# Patient Record
Sex: Male | Born: 1945 | Race: Black or African American | Hispanic: No | Marital: Married | Smoking: Former smoker
Health system: Southern US, Community
[De-identification: ages and names within clinical notes are randomized; demographics above are authoritative.]

## PROBLEM LIST (undated history)

## (undated) DIAGNOSIS — F0280 Dementia in other diseases classified elsewhere without behavioral disturbance: Secondary | ICD-10-CM

## (undated) DIAGNOSIS — I10 Essential (primary) hypertension: Secondary | ICD-10-CM

## (undated) DIAGNOSIS — M1990 Unspecified osteoarthritis, unspecified site: Secondary | ICD-10-CM

## (undated) DIAGNOSIS — F419 Anxiety disorder, unspecified: Secondary | ICD-10-CM

## (undated) DIAGNOSIS — S069XAA Unspecified intracranial injury with loss of consciousness status unknown, initial encounter: Secondary | ICD-10-CM

## (undated) HISTORY — PX: COLON SURGERY: SHX602

## (undated) HISTORY — DX: Dementia in other diseases classified elsewhere, unspecified severity, without behavioral disturbance, psychotic disturbance, mood disturbance, and anxiety: F02.80

## (undated) HISTORY — PX: HERNIA REPAIR: SHX51

## (undated) HISTORY — PX: COLOSTOMY: SHX63

## (undated) HISTORY — PX: COLONOSCOPY: SHX174

## (undated) HISTORY — PX: FOOT SURGERY: SHX648

---

## 2012-02-15 ENCOUNTER — Ambulatory Visit: Payer: Non-veteran care | Attending: Student | Admitting: Physical Therapy

## 2012-02-15 DIAGNOSIS — IMO0001 Reserved for inherently not codable concepts without codable children: Secondary | ICD-10-CM | POA: Insufficient documentation

## 2012-02-15 DIAGNOSIS — R293 Abnormal posture: Secondary | ICD-10-CM | POA: Insufficient documentation

## 2012-02-15 DIAGNOSIS — M545 Low back pain, unspecified: Secondary | ICD-10-CM | POA: Insufficient documentation

## 2012-02-15 DIAGNOSIS — M6281 Muscle weakness (generalized): Secondary | ICD-10-CM | POA: Insufficient documentation

## 2012-02-18 ENCOUNTER — Ambulatory Visit: Payer: Non-veteran care | Attending: Student | Admitting: Physical Therapy

## 2012-02-18 DIAGNOSIS — R293 Abnormal posture: Secondary | ICD-10-CM | POA: Insufficient documentation

## 2012-02-18 DIAGNOSIS — M545 Low back pain, unspecified: Secondary | ICD-10-CM | POA: Insufficient documentation

## 2012-02-18 DIAGNOSIS — M6281 Muscle weakness (generalized): Secondary | ICD-10-CM | POA: Insufficient documentation

## 2012-02-18 DIAGNOSIS — IMO0001 Reserved for inherently not codable concepts without codable children: Secondary | ICD-10-CM | POA: Insufficient documentation

## 2012-03-03 ENCOUNTER — Ambulatory Visit: Payer: Non-veteran care | Admitting: Physical Therapy

## 2012-03-05 ENCOUNTER — Ambulatory Visit: Payer: Non-veteran care | Admitting: Physical Therapy

## 2012-03-10 ENCOUNTER — Ambulatory Visit: Payer: Non-veteran care | Admitting: Physical Therapy

## 2012-03-12 ENCOUNTER — Ambulatory Visit: Payer: Non-veteran care | Admitting: Physical Therapy

## 2012-03-17 ENCOUNTER — Ambulatory Visit: Payer: Non-veteran care | Admitting: Physical Therapy

## 2012-03-19 ENCOUNTER — Ambulatory Visit: Payer: Non-veteran care | Admitting: Physical Therapy

## 2012-03-24 ENCOUNTER — Ambulatory Visit: Payer: Non-veteran care | Attending: Student | Admitting: Physical Therapy

## 2012-03-24 DIAGNOSIS — R293 Abnormal posture: Secondary | ICD-10-CM | POA: Insufficient documentation

## 2012-03-24 DIAGNOSIS — M545 Low back pain, unspecified: Secondary | ICD-10-CM | POA: Insufficient documentation

## 2012-03-24 DIAGNOSIS — M6281 Muscle weakness (generalized): Secondary | ICD-10-CM | POA: Insufficient documentation

## 2012-03-24 DIAGNOSIS — IMO0001 Reserved for inherently not codable concepts without codable children: Secondary | ICD-10-CM | POA: Insufficient documentation

## 2012-03-26 ENCOUNTER — Ambulatory Visit: Payer: Non-veteran care | Admitting: Physical Therapy

## 2012-03-31 ENCOUNTER — Ambulatory Visit: Payer: Non-veteran care | Admitting: Physical Therapy

## 2012-04-07 ENCOUNTER — Ambulatory Visit: Payer: Non-veteran care | Admitting: Physical Therapy

## 2012-04-09 ENCOUNTER — Ambulatory Visit: Payer: Non-veteran care | Admitting: Physical Therapy

## 2012-04-14 ENCOUNTER — Ambulatory Visit: Payer: Non-veteran care | Admitting: Physical Therapy

## 2017-08-21 ENCOUNTER — Ambulatory Visit: Payer: Self-pay | Admitting: Podiatry

## 2017-08-27 ENCOUNTER — Encounter: Payer: Self-pay | Admitting: Podiatry

## 2017-08-27 ENCOUNTER — Ambulatory Visit (INDEPENDENT_AMBULATORY_CARE_PROVIDER_SITE_OTHER): Payer: Non-veteran care

## 2017-08-27 ENCOUNTER — Ambulatory Visit (INDEPENDENT_AMBULATORY_CARE_PROVIDER_SITE_OTHER): Payer: Non-veteran care | Admitting: Podiatry

## 2017-08-27 VITALS — BP 114/68 | HR 86 | Temp 98.5°F | Resp 16

## 2017-08-27 DIAGNOSIS — M2012 Hallux valgus (acquired), left foot: Secondary | ICD-10-CM | POA: Diagnosis not present

## 2017-08-27 DIAGNOSIS — M2042 Other hammer toe(s) (acquired), left foot: Secondary | ICD-10-CM | POA: Diagnosis not present

## 2017-08-27 NOTE — Progress Notes (Signed)
   Subjective:    Patient ID: Carl Figueroa, male    DOB: Apr 13, 1946, 72 y.o.   MRN: [id number]  HPI    Review of Systems  Musculoskeletal: Positive for gait problem.  All other systems reviewed and are negative.      Objective:   Physical Exam        Assessment & Plan:

## 2017-08-29 ENCOUNTER — Telehealth: Payer: Self-pay | Admitting: *Deleted

## 2017-08-29 NOTE — Telephone Encounter (Signed)
"  I'm trying to get an appointment for a surgical.  Please give me a call."

## 2017-08-29 NOTE — Progress Notes (Signed)
   Subjective: 72 year old male presenting today as a new patient with a complaint of a painful bunion of the left foot that has been present for several years. He states it has caused an ulceration for the past year that is also very sore. Wearing certain shoes increases the pain. There are no alleviating factors noted. He has been applying antibiotic ointment for treatment. Patient is here for further evaluation and treatment.   No past medical history on file.    Objective: Physical Exam General: The patient is alert and oriented x3 in no acute distress.  Dermatology: Skin is cool, dry and supple bilateral lower extremities. Negative for open lesions or macerations.  Vascular: Palpable pedal pulses bilaterally. No edema or erythema noted. Capillary refill within normal limits.  Neurological: Epicritic and protective threshold grossly intact bilaterally.   Musculoskeletal Exam: Clinical evidence of bunion deformity noted to the respective foot. There is a moderate pain on palpation range of motion of the first MPJ. 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 Hammertoe contracture also noted on clinical exam to the second digit of the left foot. Symptomatic pain on palpation and range of motion also noted to the metatarsal phalangeal joints of the respective hammertoe digits.    Radiographic Exam: Increased intermetatarsal angle greater than 15 with a hallux abductus angle greater than 30 noted on AP view. Moderate degenerative changes noted within the first MPJ. Contracture deformity also noted to the interphalangeal joints and MPJs of the digits of the respective hammertoes.    Assessment: 1. HAV w/ bunion deformity left foot 2. Hammertoe deformity second digit left foot   Plan of Care:  1. Patient was evaluated. X-Rays reviewed. 2. Today we discussed the conservative versus surgical management of the presenting pathology. The patient opts for  surgical management. All possible complications and details of the procedure were explained. All patient questions were answered. No guarantees were expressed or implied. 3. Authorization for surgery was initiated today. Surgery will consist of bunionectomy with osteotomy of the left foot. PIPJ arthroplasty with MPJ capsulotomy of the left foot.  4. CAM boot dispensed.  5. Return to clinic 1 week postop.     Felecia ShellingBrent M. Esker Dever, DPM [facility]  Dr. Felecia ShellingBrent M. Kaysee Hergert, DPM    [address]                                        [place], [place] [address]                Office [phone]  Fax [phone]([phone]

## 2017-08-30 NOTE — Telephone Encounter (Signed)
"  I need to coordinate with you an operation for my foot."

## 2017-09-02 ENCOUNTER — Telehealth: Payer: Self-pay | Admitting: Podiatry

## 2017-09-02 NOTE — Telephone Encounter (Signed)
I was examined there by Dr. Logan BoresEvans and he stated that I would need surgery. I cannot reach the scheduling nurse so I'm calling this nurse to try to get a schedule date to get this operation done. My number is [phone]. Thank you.

## 2017-09-03 NOTE — Telephone Encounter (Signed)
I attempted to call the patient.  I left him a message that I apologize for the delay in calling him back.  I just received the scheduling information from the [place] office.

## 2017-09-04 NOTE — Telephone Encounter (Signed)
I am returning your call.  You want to schedule surgery with Dr. Logan BoresEvans?  "Yes, I do."  He does surgery on Thursdays.  His next available date is 09/26/2017.  "If that's the earliest he has, I'll take it."  I'll get it scheduled.  "Is there any other instructions I need to know?"  Do no eat or drink anything after midnight the night before.  Clean your foot with the scrub brush the night before.  You can register with the surgical center, instructions are in the brochure that was given to you.  Someone from the surgical center will call you with the arrival time a day or two prior to your surgery date.

## 2017-09-13 ENCOUNTER — Telehealth: Payer: Self-pay | Admitting: *Deleted

## 2017-09-13 NOTE — Telephone Encounter (Signed)
I called and left the patient a message that we have him scheduled for 09/26/2017.  Someone from the facility will call you with the arrival time a day or two prior to your appointment.  Call if you have any further questions.

## 2017-09-13 NOTE — Telephone Encounter (Signed)
Pt states he received a call with information on how to schedule for a surgery. I asked pt if he had signed a 3 page surgery consent for and he stated he had not. I told him I would transfer to schedulers to get an appt for surgery consultation and Dr. Logan BoresEvans would explain the surgery in detail and he would see a diagram of the surgery on the 3rd page of the consent form. Transferred pt to schedulers.

## 2017-09-13 NOTE — Telephone Encounter (Signed)
[facility]  I informed her that we are trying to get authorization for Carl Figueroa's surgery that's scheduled for 09/26/2017.  She asked that I fax her his clinical notes and she would get it to the proper place because he's going to need a new referral that includes surgery.  [facility].  Now we're awaiting a response.

## 2017-09-20 ENCOUNTER — Ambulatory Visit (INDEPENDENT_AMBULATORY_CARE_PROVIDER_SITE_OTHER): Payer: Non-veteran care | Admitting: Podiatry

## 2017-09-20 DIAGNOSIS — M2012 Hallux valgus (acquired), left foot: Secondary | ICD-10-CM

## 2017-09-20 DIAGNOSIS — M2042 Other hammer toe(s) (acquired), left foot: Secondary | ICD-10-CM | POA: Diagnosis not present

## 2017-09-20 NOTE — Patient Instructions (Signed)
Pre-Operative Instructions  Congratulations, you have decided to take an important step towards improving your quality of life.  You can be assured that the doctors and staff at [facility] will be with you every step of the way.  Here are some important things you should know:  1. Plan to be at the surgery center/hospital at least 1 (one) hour prior to your scheduled time, unless otherwise directed by the surgical center/hospital staff.  You must have a responsible adult accompany you, remain during the surgery and drive you home.  Make sure you have directions to the surgical center/hospital to ensure you arrive on time. 2. If you are having surgery at [facility] or [facility], you will need a copy of your medical history and physical form from your family physician within one month prior to the date of surgery. We will give you a form for your primary physician to complete.  3. We make every effort to accommodate the date you request for surgery.  However, there are times where surgery dates or times have to be moved.  We will contact you as soon as possible if a change in schedule is required.   4. No aspirin/ibuprofen for one week before surgery.  If you are on aspirin, any non-steroidal anti-inflammatory medications (Mobic, Aleve, Ibuprofen) should not be taken seven (7) days prior to your surgery.  You make take Tylenol for pain prior to surgery.  5. Medications - If you are taking daily heart and blood pressure medications, seizure, reflux, allergy, asthma, anxiety, pain or diabetes medications, make sure you notify the surgery center/hospital before the day of surgery so they can tell you which medications you should take or avoid the day of surgery. 6. No food or drink after midnight the night before surgery unless directed otherwise by surgical center/hospital staff. 7. No alcoholic beverages 24-hours prior to surgery.  No smoking 24-hours prior or 24-hours after  surgery. 8. Wear loose pants or shorts. They should be loose enough to fit over bandages, boots, and casts. 9. Don't wear slip-on shoes. Sneakers are preferred. 10. Bring your boot with you to the surgery center/hospital.  Also bring crutches or a walker if your physician has prescribed it for you.  If you do not have this equipment, it will be provided for you after surgery. 11. If you have not been contacted by the surgery center/hospital by the day before your surgery, call to confirm the date and time of your surgery. 12. Leave-time from work may vary depending on the type of surgery you have.  Appropriate arrangements should be made prior to surgery with your employer. 13. Prescriptions will be provided immediately following surgery by your doctor.  Fill these as soon as possible after surgery and take the medication as directed. Pain medications will not be refilled on weekends and must be approved by the doctor. 14. Remove nail polish on the operative foot and avoid getting pedicures prior to surgery. 15. Wash the night before surgery.  The night before surgery wash the foot and leg well with water and the antibacterial soap provided. Be sure to pay special attention to beneath the toenails and in between the toes.  Wash for at least three (3) minutes. Rinse thoroughly with water and dry well with a towel.  Perform this wash unless told not to do so by your physician.  Enclosed: 1 Ice pack (please put in freezer the night before surgery)   1 Hibiclens skin cleaner     Pre-op instructions  If you have any questions regarding the instructions, please do not hesitate to call our office.  [place]: [address], [place], [place] [address] -- [phone]  [place]: [address] [place], [place] [address] -- [phone]  [place]: [address]  [place], [place] [address] -- [phone]  [place]: [address], [address], [place],  [address] -- [phone]  Website:  [email or web address] 

## 2017-09-23 NOTE — Progress Notes (Signed)
   Subjective: 72 year old male presenting today for follow up evaluation of a bunion to the left foot and hammertoe deformity of the second digit of the left foot. He denies any changes to his condition. He is here for a surgical consultation.    No past medical history on file.    Objective: Physical Exam General: The patient is alert and oriented x3 in no acute distress.  Dermatology: Skin is cool, dry and supple bilateral lower extremities. Negative for open lesions or macerations.  Vascular: Palpable pedal pulses bilaterally. No edema or erythema noted. Capillary refill within normal limits.  Neurological: Epicritic and protective threshold grossly intact bilaterally.   Musculoskeletal Exam: Clinical evidence of bunion deformity noted to the respective foot. There is a moderate pain on palpation range of motion of the first MPJ. 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 Hammertoe contracture also noted on clinical exam to the second digit of the left foot. Symptomatic pain on palpation and range of motion also noted to the metatarsal phalangeal joints of the respective hammertoe digits.     Assessment: 1. HAV w/ bunion deformity left foot 2. Hammertoe deformity second digit left foot   Plan of Care:  1. Patient was evaluated.  2. Today we discussed the conservative versus surgical management of the presenting pathology. The patient opts for surgical management. All possible complications and details of the procedure were explained. All patient questions were answered. No guarantees were expressed or implied. 3. Authorization for surgery was re-initiated today. Surgery will consist of bunionectomy with osteotomy of the left foot. PIPJ arthroplasty with MPJ capsulotomy of the left foot.  4. Return to clinic 1 week postop.     Felecia ShellingBrent M. Leatrice Parilla, DPM [facility]  Dr. Felecia ShellingBrent M. Aalina Brege, DPM    [address]                                         [place], [place] [address]                Office [phone](336) [phone]  Fax [phone](336) [phone]

## 2017-09-25 NOTE — Telephone Encounter (Addendum)
I called and spoke to TokelauShaniqua.  I explained what was going on and what I was told as well as the patient.  She connected me to [facility] Ascinda a Charity fundraiserN.  Bonita QuinLinda informed me she could not authorize the surgery after I explained everything that has transpired during this process.  She said she would have to present it to the higher ups and they would have to make the decision.  I told her I guess I need to cancel his surgery that is scheduled for tomorrow.  She told me to hold off on canceling it for now because she was going to see if it can be authorized before they left for the evening.    Aram BeechamCynthia called the surgical center and inquired about the status of the authorization.  I informed her of what's going on.  I told her we may have to reschedule it.  She said she would get Misty to give me a call in the morning to check on the status.  I left the patient a message letting him know what was going on.  I told him I would call him in the morning and let him know about the status.  I also asked him not to eat or drink anything after midnight just in case the surgery will be still on.

## 2017-09-25 NOTE — Telephone Encounter (Signed)
"  I see you called.  Did you get it taken care of?"  I did not.  I spoke to a nurse.  She said she couldn't authorize it.  She said it had to come for the higher ups.  She said she would try to have an answer by the end of the day but I have not received anything.  So I will check again in the morning.  Do not eat or drink anything after midnight to be safe.  "Okay I will not.  So, you will call me if the surgery is approved, correct?"  Yes, that is correct.

## 2017-09-25 NOTE — Telephone Encounter (Signed)
I called and spoke [facility] [place] [facility].  I explained to her that I was calling about an authorization for surgery for Mr. Cathlean CowerBaldwin.  She informed me that she worked in the call center.  She said she would give the message to [place]iqua and that TokelauShaniqua would call me back.    I called and informed Mr. Cathlean CowerBaldwin about what was going on with the [facility].  I informed him that he may want to call and see what he can find out about the authorization for his surgery.  He stated he would give them a call.  Mrs. Cathlean CowerBaldwin called me back and stated they called and [facility].  She stated Dondra SpryGail had a nasty demeanor.  She said she must be having a bad day.  She stated Dondra SpryGail told her that the physician's office had not sent in a request for authorization for surgery.  She stated no notes or anything had been sent.  I informed her that we had [facility] all the [facility].  I gave her the names of everyone I had spoken to previously.  I told her I would call again and see what I could find out.  She said they told her it could take up to 21 days to process the request.  She said her husband is in a lot of pain with that foot and hopes he does not have to wait that long.  I told her I would let her know what I find out but we may have to cancel the surgery.  She stated she understood.

## 2017-09-25 NOTE — Telephone Encounter (Signed)
[facility] to see if [name]'s surgery had been authorized.  [facility].  She gave me an authorization number of [id number] with three visits.  The validation is good from 07/31/17 to 12/01/17  I asked her if this authorization included surgery.  She stated she did not know.  I informed her [facility] had [facility].  She told me I needed to speak to someone on that team.  She said she will send them a message and someone would give me a call back.

## 2017-09-26 ENCOUNTER — Encounter: Payer: Self-pay | Admitting: Podiatry

## 2017-09-26 NOTE — Telephone Encounter (Signed)
"  My name is Selinda MichaelsLinda Hunt.  I have been working on Mr. Pia MauBaldwin's case.  He was supposed to have surgery today.  The procedures were a Double Osteotomy, Hammer Toe and Capsulotomy MPJ Release Joint.   These procedures are covered under the Authorization code that was given to you."  I asked several people; Isaiah Serge[facility]annon, Gail, and [place] and they all said it wasn't when I asked them.  What's the authorization number?  "I reviewed the notes that you did send on 09/13/2017 and it did state in those notes that the patient would have surgery on 09/26/2017 and they also stated the procedures to be performed.  I consulted with the [employer]A and they said that the surgery was covered.  The authorization number is [id number], it's valid from 08/06/2017 to 12/01/2017.  It has how many visits?"  It has three visits.  "How many has he used?"  He's going to need a new authorization.  "Check with his doctor and see how many after care visits he will need.  Call me back or send me a message letting me know.  I will send the new authorization to you.  Let me know the new date of surgery."  I will check with Dr. Logan BoresEvans and contact Mr. Cathlean CowerBaldwin to schedule his surgery.  What's your fax number?  "Send it to the same fax number, [phone].   I called and informed Mr. Cathlean CowerBaldwin.  We are going to schedule him for surgery on 10/03/2017.   I called Aram BeechamCynthia at [facility] and scheduled his surgery.  I gave her authorization number.  She is requesting a copy of the authorization once we receive it from the [facility].

## 2017-10-02 ENCOUNTER — Telehealth: Payer: Self-pay | Admitting: *Deleted

## 2017-10-02 NOTE — Telephone Encounter (Signed)
"  I'm calling to see if I am okay to have the surgery tomorrow.  Was it approved?"  I was told by Carl Figueroa that your surgery was authorized.  I haven't received the actual referral yet but I sent her the information that she needed which was the date of your surgery and the number of follow-up visits.  "Okay, what time do I need to be there and where am I going?  Is it over by the hospital?"  You should have received a call by now.  You can give them a call, the phone number is [phone].  They are not near the hospital.  Their address is [address]  The address is on the back of the brochure that we gave you.  "Okay, thank you so much."

## 2017-10-03 DIAGNOSIS — M2042 Other hammer toe(s) (acquired), left foot: Secondary | ICD-10-CM | POA: Diagnosis not present

## 2017-10-03 DIAGNOSIS — M2022 Hallux rigidus, left foot: Secondary | ICD-10-CM | POA: Diagnosis not present

## 2017-10-03 DIAGNOSIS — M7752 Other enthesopathy of left foot: Secondary | ICD-10-CM | POA: Diagnosis not present

## 2017-10-04 ENCOUNTER — Encounter: Payer: Non-veteran care | Admitting: Podiatry

## 2017-10-11 ENCOUNTER — Encounter: Payer: Self-pay | Admitting: Podiatry

## 2017-10-11 ENCOUNTER — Ambulatory Visit (INDEPENDENT_AMBULATORY_CARE_PROVIDER_SITE_OTHER): Payer: Non-veteran care

## 2017-10-11 ENCOUNTER — Encounter: Payer: Non-veteran care | Admitting: Podiatry

## 2017-10-11 ENCOUNTER — Ambulatory Visit (INDEPENDENT_AMBULATORY_CARE_PROVIDER_SITE_OTHER): Payer: Non-veteran care | Admitting: Podiatry

## 2017-10-11 VITALS — BP 102/64 | Temp 97.4°F

## 2017-10-11 DIAGNOSIS — Z9889 Other specified postprocedural states: Secondary | ICD-10-CM

## 2017-10-11 DIAGNOSIS — M2012 Hallux valgus (acquired), left foot: Secondary | ICD-10-CM

## 2017-10-14 NOTE — Progress Notes (Signed)
   Subjective:  Patient presents today status post bunionectomy and 2nd hammertoe repair left. DOS: 10/03/17. He states he is doing well. He denies any pain or complaints at this time. Patient is here for further evaluation and treatment.    No past medical history on file.    Objective/Physical Exam Neurovascular status intact.  Skin incisions appear to be well coapted with sutures and staples intact. No sign of infectious process noted. No dehiscence. No active bleeding noted. Moderate edema noted to the surgical extremity.  Radiographic Exam:  Orthopedic hardware and osteotomies sites appear to be stable with routine healing.  Assessment: 1. s/p bunionectomy and 2nd hammertoe repair left. DOS: 10/03/17   Plan of Care:  1. Patient was evaluated. X-rays reviewed 2. Dressing changed. 3. Continue minimal weightbearing in CAM boot.  4. Return to clinic in one week.    Felecia ShellingBrent M. Savhanna Sliva, DPM [facility]  Dr. Felecia ShellingBrent M. Akesha Uresti, DPM    [address]                                        [place], [place] [address]                Office [phone](336) [phone]  Fax [phone](336) [phone]

## 2017-10-18 ENCOUNTER — Ambulatory Visit (INDEPENDENT_AMBULATORY_CARE_PROVIDER_SITE_OTHER): Payer: Non-veteran care | Admitting: Podiatry

## 2017-10-18 DIAGNOSIS — Z9889 Other specified postprocedural states: Secondary | ICD-10-CM

## 2017-10-21 NOTE — Progress Notes (Signed)
   Subjective:  Patient presents today status post bunionectomy and 2nd hammertoe repair left. DOS: 10/03/17. He states he is doing much better. He reports a decrease in the swelling. He denies any new complaints at this time. Patient is here for further evaluation and treatment.    No past medical history on file.    Objective/Physical Exam Neurovascular status intact.  Skin incisions appear to be well coapted with sutures and staples intact. No sign of infectious process noted. No dehiscence. No active bleeding noted. Moderate edema noted to the surgical extremity.  Assessment: 1. s/p bunionectomy and 2nd hammertoe repair left. DOS: 10/03/17   Plan of Care:  1. Patient was evaluated.  2. Staples removed.  3. Continue weightbearing in CAM boot.  4. Return to clinic in 2 weeks.    Felecia ShellingBrent M. Evans, DPM [facility]  Dr. Felecia ShellingBrent M. Evans, DPM    [address]                                        [place], [place] [address]                Office [phone](336) [phone]  Fax [phone](336) [phone]

## 2017-10-28 NOTE — Progress Notes (Signed)
Bunionectomy with osteotomy left foot. Hammertoe repair second digit left foot.

## 2017-11-01 ENCOUNTER — Encounter: Payer: Self-pay | Admitting: Podiatry

## 2017-11-01 ENCOUNTER — Ambulatory Visit (INDEPENDENT_AMBULATORY_CARE_PROVIDER_SITE_OTHER): Payer: Non-veteran care | Admitting: Podiatry

## 2017-11-01 ENCOUNTER — Ambulatory Visit (INDEPENDENT_AMBULATORY_CARE_PROVIDER_SITE_OTHER): Payer: Non-veteran care

## 2017-11-01 DIAGNOSIS — M2012 Hallux valgus (acquired), left foot: Secondary | ICD-10-CM

## 2017-11-01 DIAGNOSIS — Z9889 Other specified postprocedural states: Secondary | ICD-10-CM

## 2017-11-01 NOTE — Progress Notes (Signed)
   Subjective:  Patient presents today status post bunionectomy and 2nd hammertoe repair left. DOS: 10/03/17. He reports an improvement in the pain. He reports some associated numbness and tingling of the foot. Patient is here for further evaluation and treatment.    No past medical history on file.    Objective/Physical Exam Neurovascular status intact.  Skin incisions appear to be well coapted. No sign of infectious process noted. No dehiscence. No active bleeding noted. Moderate edema noted to the surgical extremity.  Radiographic Exam:  Orthopedic hardware and osteotomies sites appear to be stable with routine healing.  Assessment: 1. s/p bunionectomy and 2nd hammertoe repair left. DOS: 10/03/17   Plan of Care:  1. Patient was evaluated. X-Rays reviewed.  2. Pins removed.  3. Post op shoe dispensed. Discontinue using CAM boot. Weightbearing as tolerated.  4. Compression anklet dispensed.  5. Return to clinic in 4 weeks.   Felecia ShellingBrent M. Zendayah Hardgrave, DPM [facility]  Dr. Felecia ShellingBrent M. Delpha Perko, DPM    [address]                                        [place], [place] [address]                Office [phone](336) [phone]  Fax [phone](336) [phone]

## 2017-11-29 ENCOUNTER — Ambulatory Visit (INDEPENDENT_AMBULATORY_CARE_PROVIDER_SITE_OTHER): Payer: Non-veteran care | Admitting: Podiatry

## 2017-11-29 ENCOUNTER — Ambulatory Visit (INDEPENDENT_AMBULATORY_CARE_PROVIDER_SITE_OTHER): Payer: Non-veteran care

## 2017-11-29 ENCOUNTER — Encounter: Payer: Self-pay | Admitting: Podiatry

## 2017-11-29 DIAGNOSIS — M21619 Bunion of unspecified foot: Secondary | ICD-10-CM

## 2017-11-29 DIAGNOSIS — Z9889 Other specified postprocedural states: Secondary | ICD-10-CM

## 2017-12-02 NOTE — Progress Notes (Signed)
   Subjective:  Patient presents today status post bunionectomy and 2nd hammertoe repair left. DOS: 10/03/17. He states his left second toe is rubbing against the great toe. He reports associated swelling of the foot that began today. He denies pain. Patient is here for further evaluation and treatment.    No past medical history on file.    Objective/Physical Exam Neurovascular status intact.  Skin incisions appear to be well coapted. No sign of infectious process noted. No dehiscence. No active bleeding noted. Moderate edema noted to the surgical extremity.  Radiographic Exam:  Orthopedic hardware and osteotomies sites appear to be stable with routine healing.  Assessment: 1. s/p bunionectomy and 2nd hammertoe repair left. DOS: 10/03/17   Plan of Care:  1. Patient was evaluated. X-Rays reviewed.  2. May return to full duty with no restrictions.  3. Resume wearing good shoe gear.  4. Compression anklet dispensed.  5. Return to clinic as needed.   Felecia ShellingBrent M. Evans, DPM [facility]  Dr. Felecia ShellingBrent M. Evans, DPM    [address]                                        [place], [place] [address]                Office [phone]) [phone]  Fax [phone](336) [phone]

## 2019-07-06 ENCOUNTER — Inpatient Hospital Stay
Admission: EM | Admit: 2019-07-06 | Discharge: 2019-07-09 | DRG: 871 | Disposition: A | Discharge status: Home Health | Payer: No Typology Code available for payment source | Attending: Internal Medicine | Admitting: Internal Medicine

## 2019-07-06 ENCOUNTER — Other Ambulatory Visit: Payer: Self-pay

## 2019-07-06 ENCOUNTER — Emergency Department: Payer: No Typology Code available for payment source

## 2019-07-06 DIAGNOSIS — R7989 Other specified abnormal findings of blood chemistry: Secondary | ICD-10-CM | POA: Diagnosis present

## 2019-07-06 DIAGNOSIS — N3090 Cystitis, unspecified without hematuria: Secondary | ICD-10-CM

## 2019-07-06 DIAGNOSIS — F419 Anxiety disorder, unspecified: Secondary | ICD-10-CM | POA: Diagnosis present

## 2019-07-06 DIAGNOSIS — F0390 Unspecified dementia without behavioral disturbance: Secondary | ICD-10-CM | POA: Diagnosis present

## 2019-07-06 DIAGNOSIS — Z79899 Other long term (current) drug therapy: Secondary | ICD-10-CM

## 2019-07-06 DIAGNOSIS — D696 Thrombocytopenia, unspecified: Secondary | ICD-10-CM | POA: Diagnosis present

## 2019-07-06 DIAGNOSIS — A419 Sepsis, unspecified organism: Principal | ICD-10-CM | POA: Diagnosis present

## 2019-07-06 DIAGNOSIS — M545 Low back pain: Secondary | ICD-10-CM | POA: Diagnosis present

## 2019-07-06 DIAGNOSIS — N179 Acute kidney failure, unspecified: Secondary | ICD-10-CM | POA: Diagnosis present

## 2019-07-06 DIAGNOSIS — Z20828 Contact with and (suspected) exposure to other viral communicable diseases: Secondary | ICD-10-CM | POA: Diagnosis present

## 2019-07-06 DIAGNOSIS — F05 Delirium due to known physiological condition: Secondary | ICD-10-CM | POA: Diagnosis present

## 2019-07-06 DIAGNOSIS — N390 Urinary tract infection, site not specified: Secondary | ICD-10-CM | POA: Diagnosis present

## 2019-07-06 DIAGNOSIS — G9341 Metabolic encephalopathy: Secondary | ICD-10-CM | POA: Diagnosis present

## 2019-07-06 DIAGNOSIS — R778 Other specified abnormalities of plasma proteins: Secondary | ICD-10-CM | POA: Diagnosis present

## 2019-07-06 DIAGNOSIS — I10 Essential (primary) hypertension: Secondary | ICD-10-CM | POA: Diagnosis present

## 2019-07-06 DIAGNOSIS — R319 Hematuria, unspecified: Secondary | ICD-10-CM | POA: Diagnosis present

## 2019-07-06 DIAGNOSIS — N3091 Cystitis, unspecified with hematuria: Secondary | ICD-10-CM | POA: Diagnosis present

## 2019-07-06 DIAGNOSIS — G8929 Other chronic pain: Secondary | ICD-10-CM | POA: Diagnosis present

## 2019-07-06 HISTORY — DX: Essential (primary) hypertension: I10

## 2019-07-06 LAB — BASIC METABOLIC PANEL
Anion gap: 11 (ref 5–15)
BUN: 11 mg/dL (ref 8–23)
CO2: 26 mmol/L (ref 22–32)
Calcium: 9.4 mg/dL (ref 8.9–10.3)
Chloride: 108 mmol/L (ref 98–111)
Creatinine, Ser: 1.56 mg/dL — ABNORMAL HIGH (ref 0.61–1.24)
GFR calc Af Amer: 50 mL/min — ABNORMAL LOW (ref >60)
GFR calc non Af Amer: 43 mL/min — ABNORMAL LOW (ref >60)
Glucose, Bld: 119 mg/dL — ABNORMAL HIGH (ref 70–99)
Potassium: 3.6 mmol/L (ref 3.5–5.1)
Sodium: 145 mmol/L (ref 135–145)

## 2019-07-06 LAB — URINALYSIS, COMPLETE (UACMP) WITH MICROSCOPIC
Bilirubin Urine: NEGATIVE
Glucose, UA: NEGATIVE mg/dL
Ketones, ur: NEGATIVE mg/dL
Nitrite: NEGATIVE
Protein, ur: 100 mg/dL — AB
RBC / HPF: >50 RBC/hpf — ABNORMAL HIGH (ref 0–5)
Specific Gravity, Urine: 1.015 (ref 1.005–1.030)
WBC, UA: >50 WBC/hpf — ABNORMAL HIGH (ref 0–5)
pH: 5 (ref 5.0–8.0)

## 2019-07-06 LAB — TROPONIN I (HIGH SENSITIVITY): Troponin I (High Sensitivity): 40 ng/L — ABNORMAL HIGH (ref <18)

## 2019-07-06 LAB — CBC
HCT: 39.2 % (ref 39.0–52.0)
Hemoglobin: 13 g/dL (ref 13.0–17.0)
MCH: 28.3 pg (ref 26.0–34.0)
MCHC: 33.2 g/dL (ref 30.0–36.0)
MCV: 85.4 fL (ref 80.0–100.0)
Platelets: 149 10*3/uL — ABNORMAL LOW (ref 150–400)
RBC: 4.59 MIL/uL (ref 4.22–5.81)
RDW: 12.8 % (ref 11.5–15.5)
WBC: 9.4 10*3/uL (ref 4.0–10.5)
nRBC: 0 % (ref 0.0–0.2)

## 2019-07-06 LAB — LACTIC ACID, PLASMA: Lactic Acid, Venous: 1.6 mmol/L (ref 0.5–1.9)

## 2019-07-06 IMAGING — CR DG CHEST 2V
1 series · 2 of 2 positions shown · non-contrast
Comparison: None.

CLINICAL DATA: Fever, right chest pain

EXAM:
CHEST - 2 VIEW

[Series 1: dg chest 2 view · 0.14mm/px · 2 of 2 slices shown]
[im 1/2]
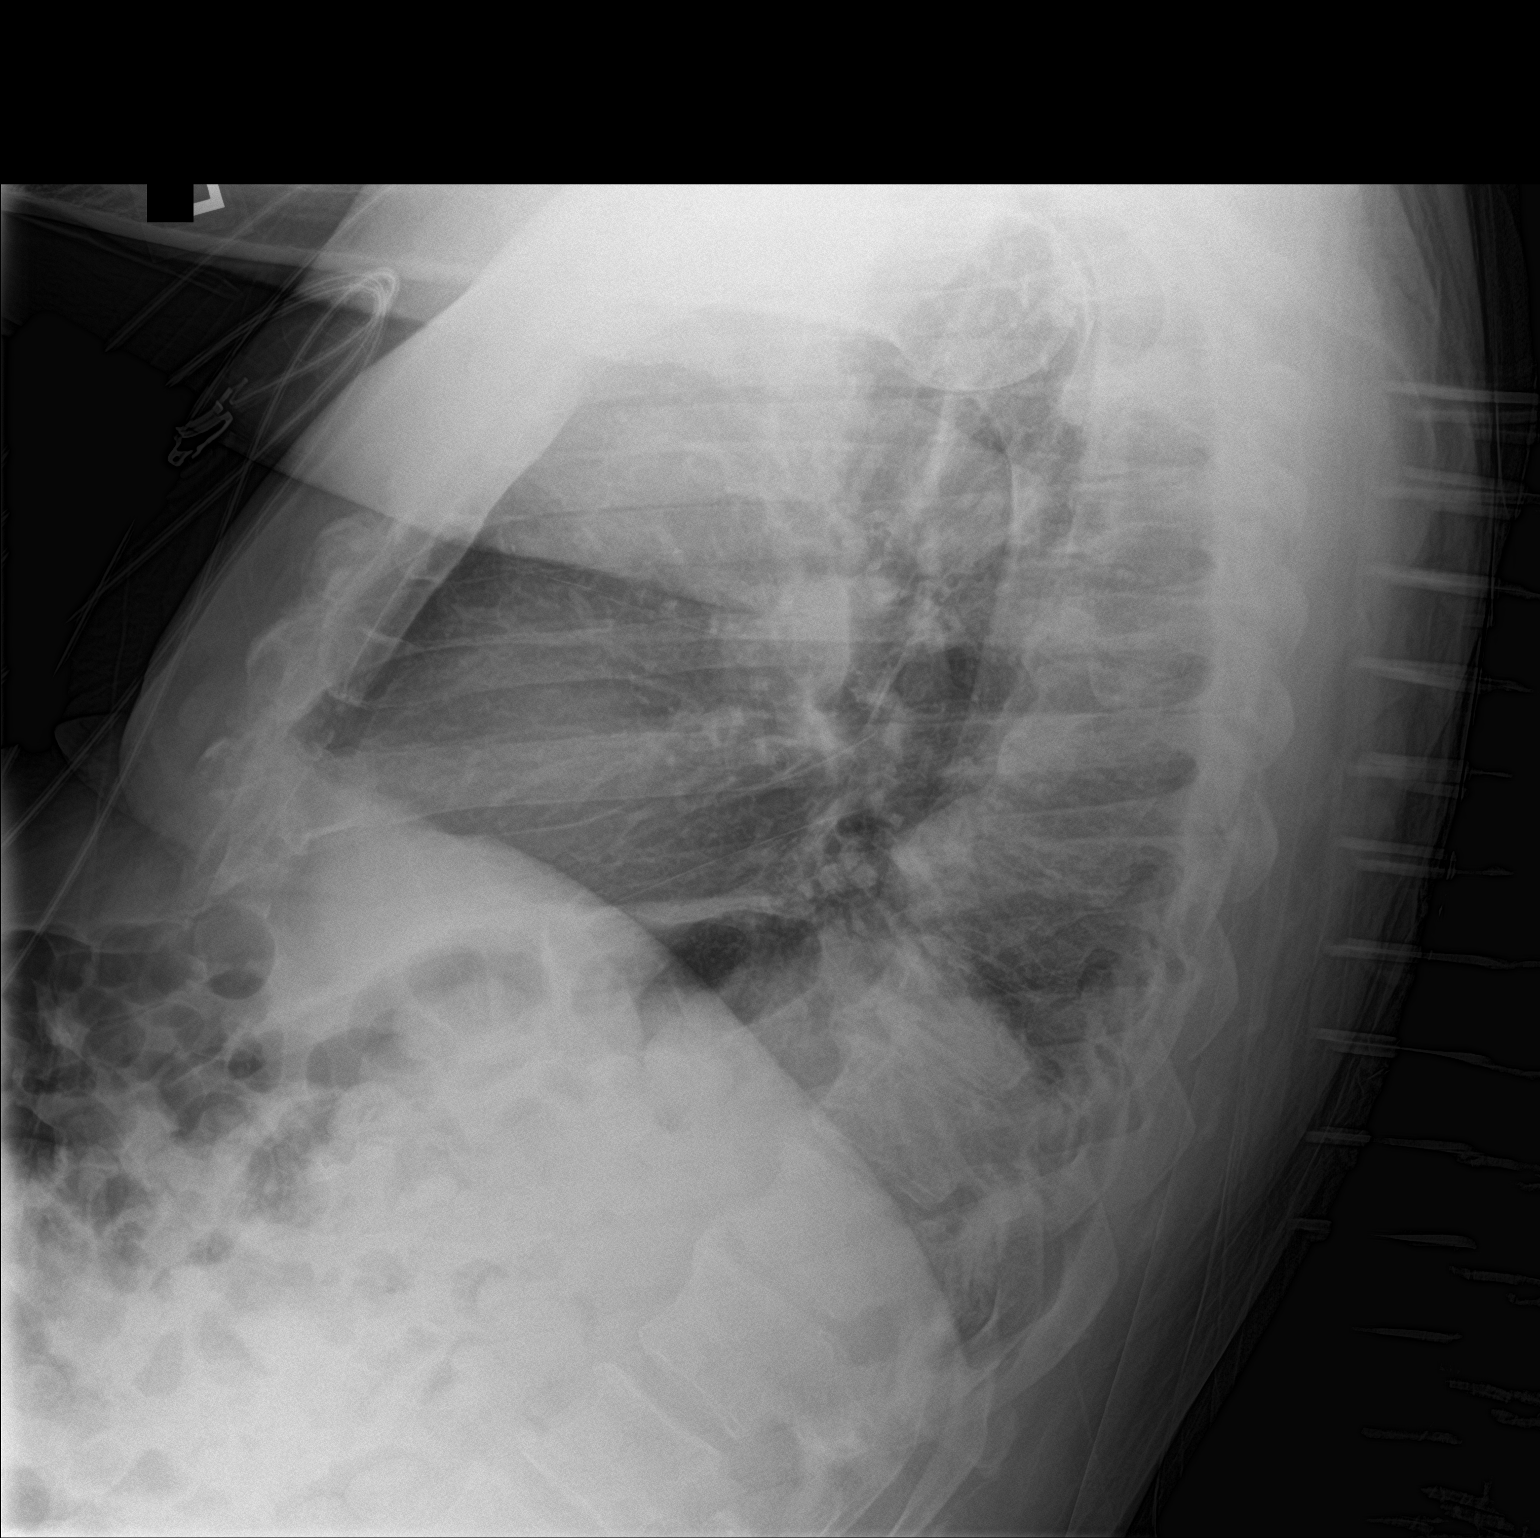
[im 2/2]
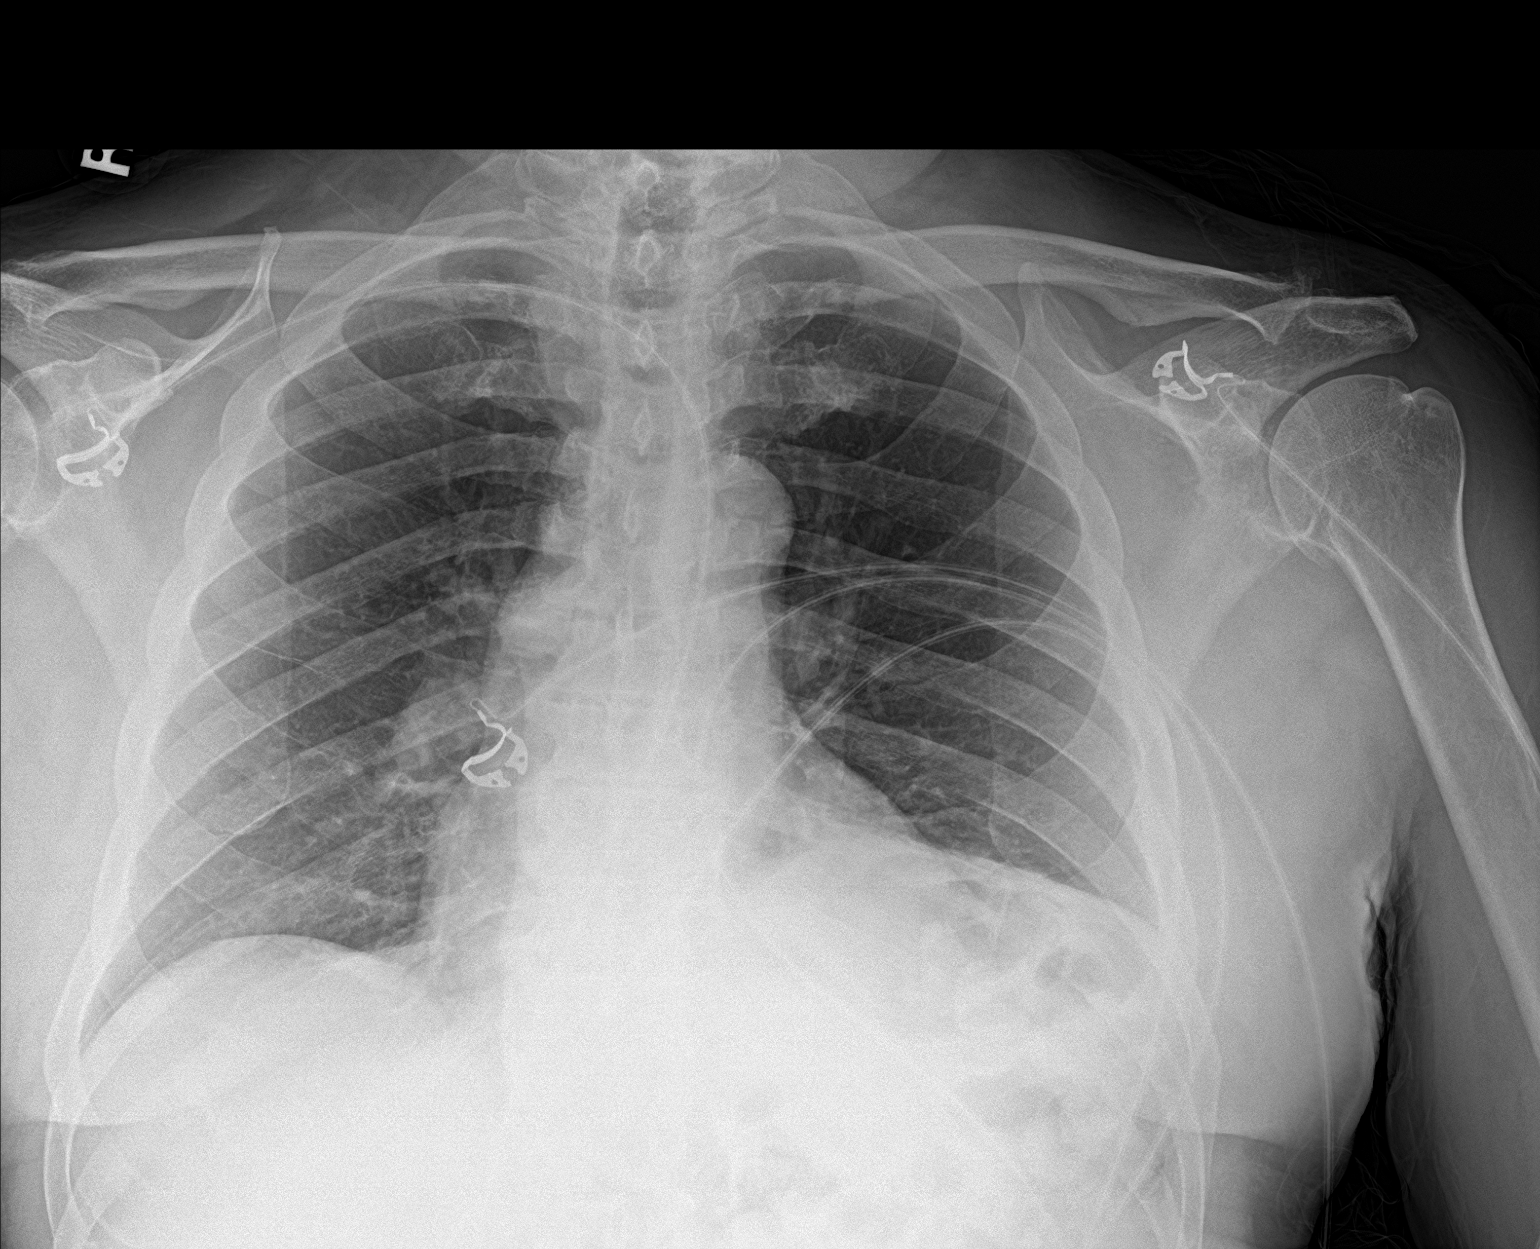

[2 of 2 positions shown; findings below may reference images not displayed]

FINDINGS: Slight elevation of the left hemidiaphragm with left base
atelectasis. Right lung clear. Heart is normal size. No effusions.
No acute bony abnormality.
IMPRESSION: Minimal left base atelectasis.

## 2019-07-06 IMAGING — CT CT RENAL STONE PROTOCOL
2 of 4 series · 14 of 46 positions shown, 16 images · non-contrast
Comparison: None.
COMPARISON: None.

Addendum:
CLINICAL DATA: Frequency hematuria flank pain

EXAM:
CT ABDOMEN AND PELVIS WITHOUT CONTRAST
TECHNIQUE: Multidetector CT imaging of the abdomen and pelvis was performed
following the standard protocol without IV contrast.

[Series 2: stone full standard · axial · 0.71mm/px · z∈[-1248,-878]mm · 11 of 90 slices shown, 13 images]
[im 8/90  soft-tissue]
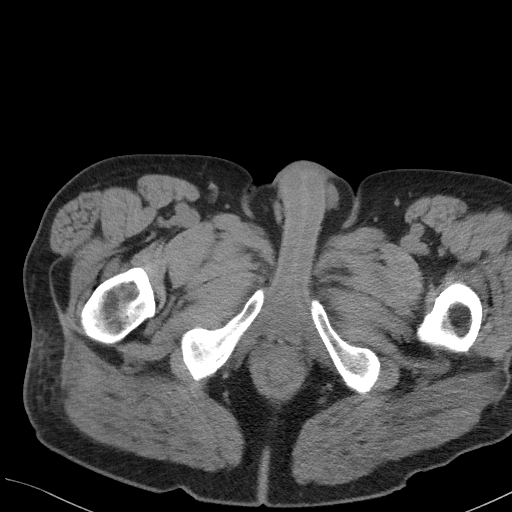
[im 8/90  bone]
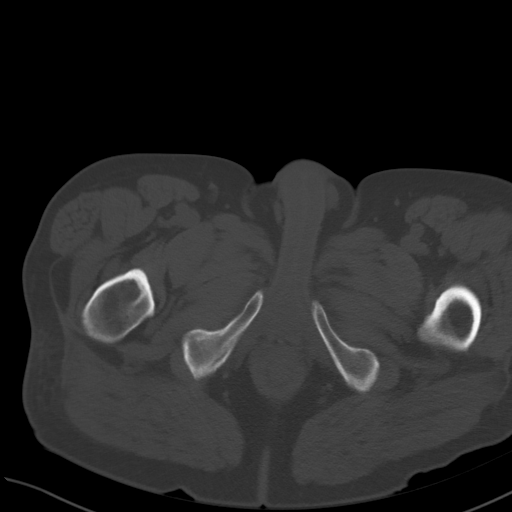
[im 15/90  soft-tissue]
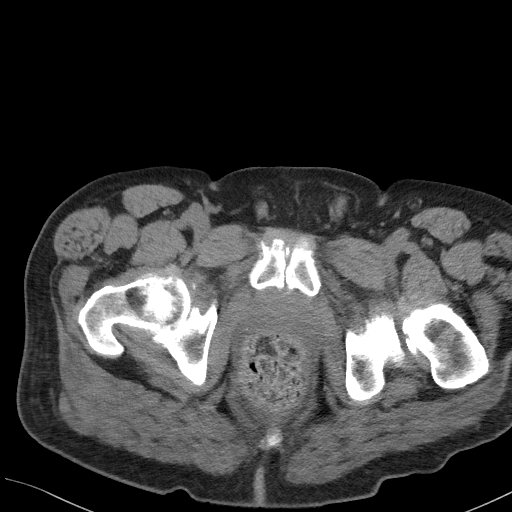
[im 23/90  soft-tissue]
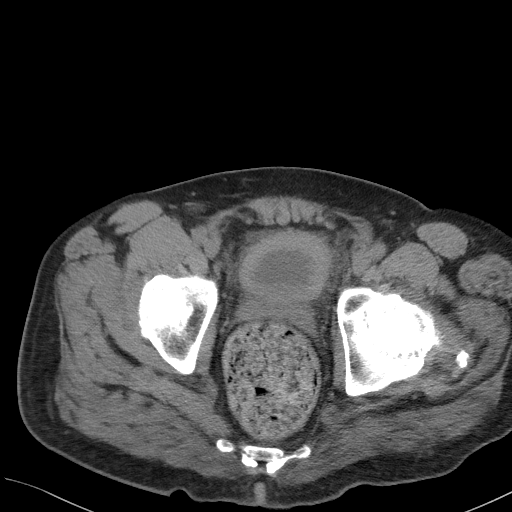
[im 30/90  soft-tissue]
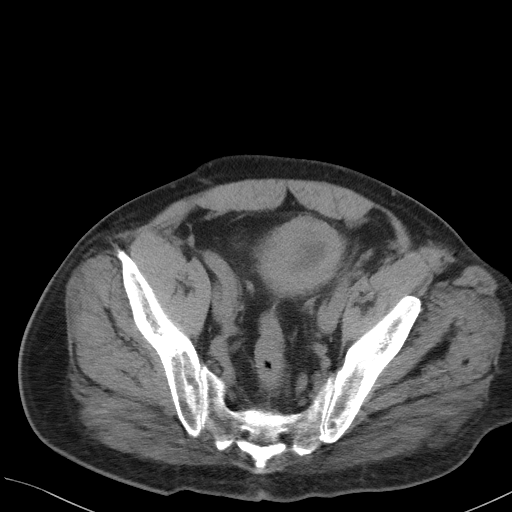
[im 38/90  soft-tissue]
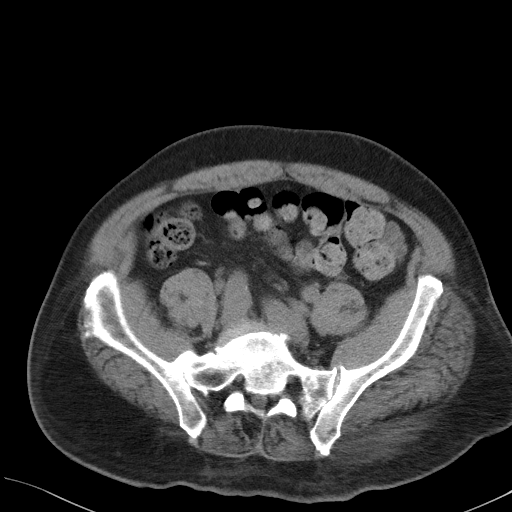
[im 45/90  soft-tissue]
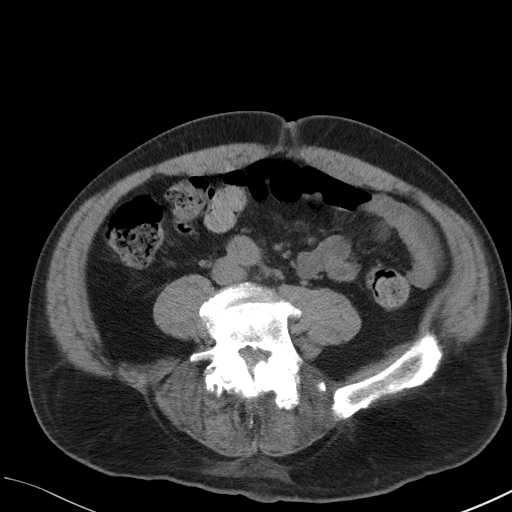
[im 52/90  soft-tissue]
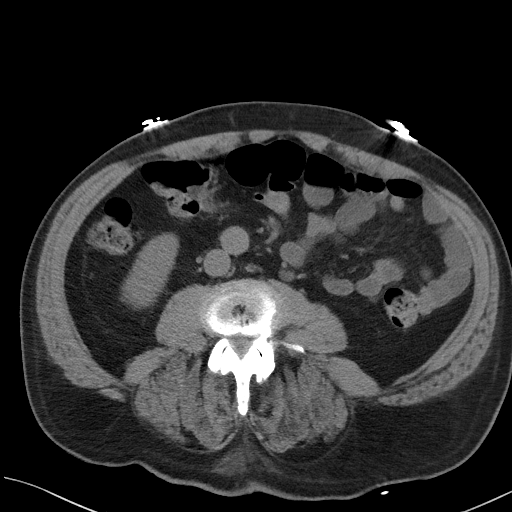
[im 60/90  soft-tissue]
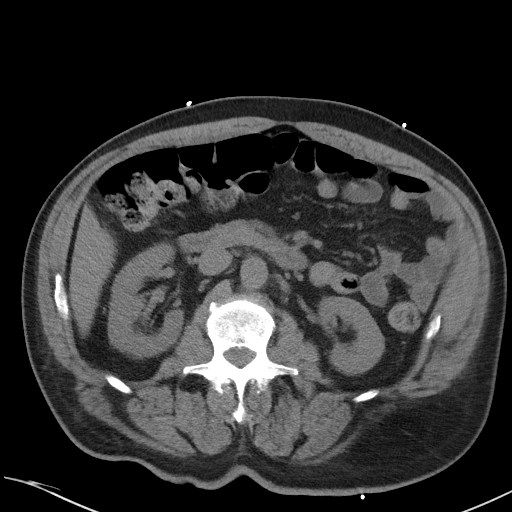
[im 67/90  soft-tissue]
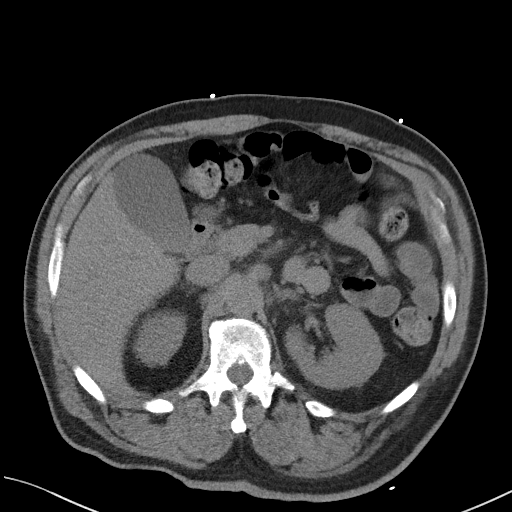
[im 67/90  bone]
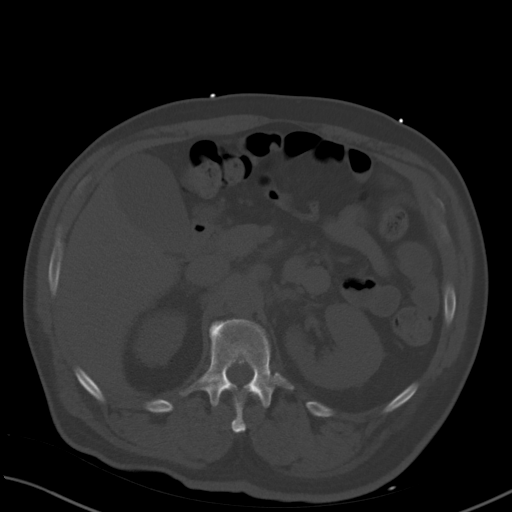
[im 75/90  soft-tissue]
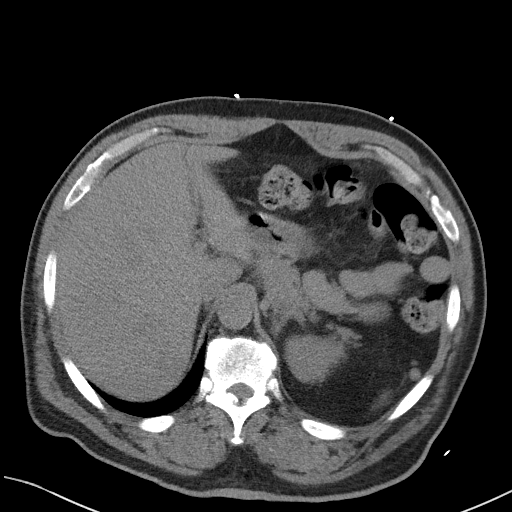
[im 82/90  soft-tissue]
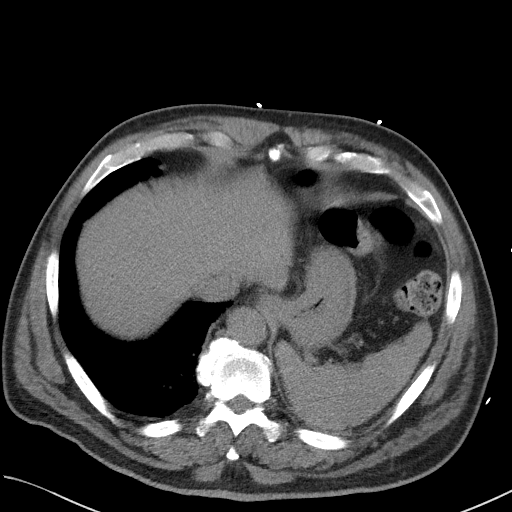

[Series 5: coronal · coronal · 0.72mm/px · 3 of 143 slices shown]
[im 48/143  soft-tissue]
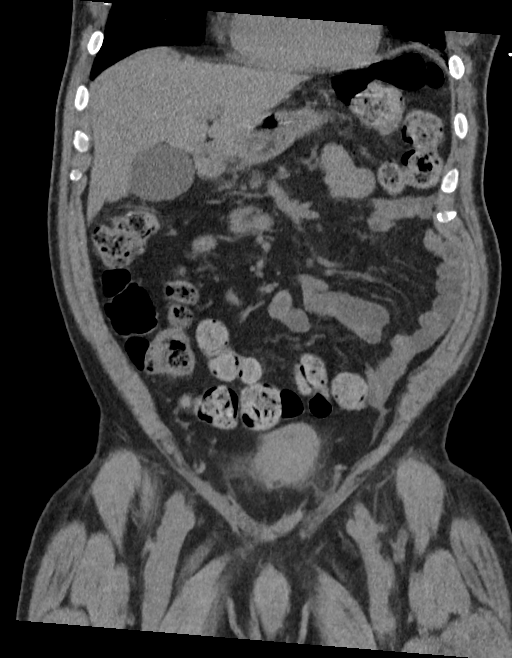
[im 64/143  soft-tissue]
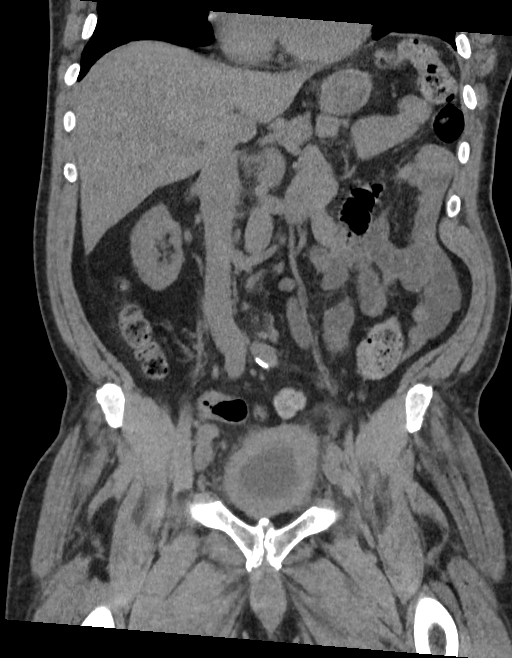
[im 79/143  soft-tissue]
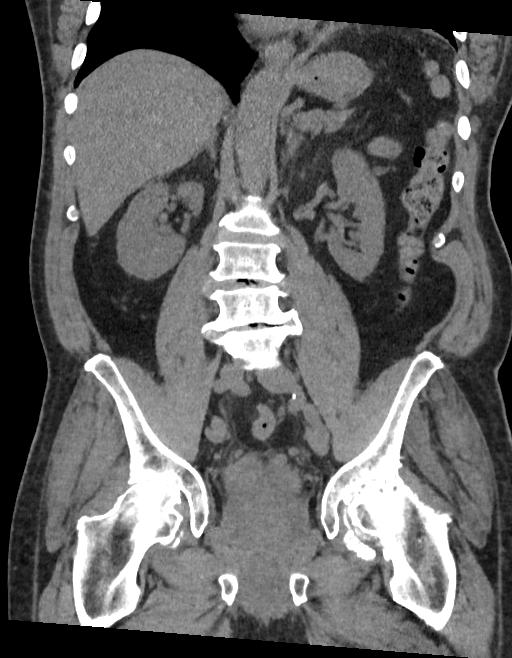

[14 of 46 positions shown; findings below may reference images not displayed]

FINDINGS: Lower chest: Lung bases demonstrate no acute consolidation or
effusion. Elevation of left diaphragm with mild left pleural
thickening. Heart size within normal limits.

Hepatobiliary: No focal liver abnormality is seen. No gallstones,
gallbladder wall thickening, or biliary dilatation.

Pancreas: Unremarkable. No pancreatic ductal dilatation or
surrounding inflammatory changes.

Spleen: Normal in size without focal abnormality.

Adrenals/Urinary Tract: Adrenal glands are normal. No
hydronephrosis. Negative for ureteral stone. Slightly dense
appearing bladder wall with marked wall thickening and inflammatory
changes.

Stomach/Bowel: Stomach is within normal limits. Appendix not well
seen but no right lower quadrant inflammatory process. No evidence
of bowel wall thickening, distention, or inflammatory changes. Large
formed feces at the rectum.

Vascular/Lymphatic: Mild aortic atherosclerosis. No aneurysm. No
significantly enlarged lymph nodes.

Reproductive: Slightly enlarged prostate.

Other: No free air or free fluid.  Small fat in the umbilical region

Musculoskeletal: Grade 1 anterolisthesis L4 on L5. Prominent
degenerative changes of the lumbosacral spine.
IMPRESSION: 1. Slightly dense appearing bladder wall which demonstrates marked
wall thickening and surrounding inflammatory changes, suspicious for
an acute cystitis. Negative for hydronephrosis or ureteral stone.
2. Large amount of stool in the colon with large formed feces at the
rectum suggesting constipation.

ADDENDUM:
Advanced arthritis of the left hip with severe joint space narrowing
and prominent subarticular cystic change

*** End of Addendum ***
FINDINGS: Lower chest: Lung bases demonstrate no acute consolidation or
effusion. Elevation of left diaphragm with mild left pleural
thickening. Heart size within normal limits.

Hepatobiliary: No focal liver abnormality is seen. No gallstones,
gallbladder wall thickening, or biliary dilatation.

Pancreas: Unremarkable. No pancreatic ductal dilatation or
surrounding inflammatory changes.

Spleen: Normal in size without focal abnormality.

Adrenals/Urinary Tract: Adrenal glands are normal. No
hydronephrosis. Negative for ureteral stone. Slightly dense
appearing bladder wall with marked wall thickening and inflammatory
changes.

Stomach/Bowel: Stomach is within normal limits. Appendix not well
seen but no right lower quadrant inflammatory process. No evidence
of bowel wall thickening, distention, or inflammatory changes. Large
formed feces at the rectum.

Vascular/Lymphatic: Mild aortic atherosclerosis. No aneurysm. No
significantly enlarged lymph nodes.

Reproductive: Slightly enlarged prostate.

Other: No free air or free fluid.  Small fat in the umbilical region

Musculoskeletal: Grade 1 anterolisthesis L4 on L5. Prominent
degenerative changes of the lumbosacral spine.
IMPRESSION: 1. Slightly dense appearing bladder wall which demonstrates marked
wall thickening and surrounding inflammatory changes, suspicious for
an acute cystitis. Negative for hydronephrosis or ureteral stone.
2. Large amount of stool in the colon with large formed feces at the
rectum suggesting constipation.

## 2019-07-06 MED ORDER — ACETAMINOPHEN 500 MG PO TABS
1000.0000 mg | ORAL_TABLET | Freq: Once | ORAL | Status: AC
  Administered 2019-07-06: 20:00:00 1000 mg via ORAL
  Filled 2019-07-06: qty 2

## 2019-07-06 MED ORDER — SODIUM CHLORIDE 0.9 % IV SOLN
1.0000 g | Freq: Once | INTRAVENOUS | Status: AC
  Administered 2019-07-06: 1 g via INTRAVENOUS
  Filled 2019-07-06: qty 10

## 2019-07-06 MED ORDER — SODIUM CHLORIDE 0.9 % IV BOLUS
500.0000 mL | Freq: Once | INTRAVENOUS | Status: AC
  Administered 2019-07-06: 500 mL via INTRAVENOUS

## 2019-07-06 NOTE — ED Provider Notes (Addendum)
[facility] Emergency Department Provider Note    First MD Initiated Contact with Patient 07/06/19 2015     (approximate)  I have reviewed the triage vital signs and the nursing notes.   HISTORY  Chief Complaint Altered Mental Status and Fever    HPI Carl Figueroa is a 73 y.o. male is the ER for evaluation of confusion and altered mental status.  Patient states that he felt confused earlier today has been having fevers and chills for the past 24 hours.  No nausea vomiting or diarrhea.  No cough or shortness of breath has had some right-sided chest pain no.  Has also had increased urinary frequency and darker colored urine.    No past medical history on file. No family history on file.  There are no active problems to display for this patient.     Prior to Admission medications   Medication Sig Start Date End Date Taking? Authorizing Provider  clonazePAM (KLONOPIN) 0.5 MG tablet Take by mouth.    [provider]  lisinopril (PRINIVIL,ZESTRIL) 2.5 MG tablet Take by mouth.    [provider]  traMADol (ULTRAM) 50 MG tablet Take by mouth every 6 (six) hours as needed.    [provider]  venlafaxine (EFFEXOR) 100 MG tablet Take 100 mg by mouth 2 (two) times daily.    [provider]    Allergies Morphine    Social History Social History   Tobacco Use  . Smoking status: Never Smoker  . Smokeless tobacco: Never Used  Substance Use Topics  . Alcohol use: No    Frequency: Never  . Drug use: No    Review of Systems Patient denies headaches, rhinorrhea, blurry vision, numbness, shortness of breath, chest pain, edema, cough, abdominal pain, nausea, vomiting, diarrhea, dysuria, fevers, rashes or hallucinations unless otherwise stated above in HPI. ____________________________________________   PHYSICAL EXAM:  VITAL SIGNS: Vitals:   07/06/19 2201 07/06/19 2230  BP:  129/67  Pulse:  92  Resp:  (!) 26   Temp: 99.6 F (37.6 C)   SpO2:  98%    Constitutional: Alert ill appearing and confused to place and time Eyes: Conjunctivae are normal.  Head: Atraumatic. Nose: No congestion/rhinnorhea. Mouth/Throat: Mucous membranes are moist.   Neck: No stridor. Painless ROM.  Cardiovascular: tachycardic rate, regular rhythm. Grossly normal heart sounds.  Good peripheral circulation. Respiratory: Normal respiratory effort.  No retractions. Lungs with bibasilar crackles Gastrointestinal: Soft and nontender. No distention. No abdominal bruits. No CVA tenderness. Genitourinary:  Musculoskeletal: No lower extremity tenderness nor edema.  No joint effusions. Neurologic:  Normal speech and language. No gross focal neurologic deficits are appreciated. No facial droop Skin:  Skin is warm, dry and intact. No rash noted. Psychiatric: Mood and affect are normal. Speech and behavior are normal.  ____________________________________________   LABS (all labs ordered are listed, but only abnormal results are displayed)  Results for orders placed or performed during the hospital encounter of 07/06/19 (from the past 24 hour(s))  Basic metabolic panel     Status: Abnormal   Collection Time: 07/06/19  7:57 PM  Result Value Ref Range   Sodium 145 135 - 145 mmol/L   Potassium 3.6 3.5 - 5.1 mmol/L   Chloride 108 98 - 111 mmol/L   CO2 26 22 - 32 mmol/L   Glucose, Bld 119 (H) 70 - 99 mg/dL   BUN 11 8 - 23 mg/dL   Creatinine, Ser 1.611.56 (H) 0.61 - 1.24 mg/dL  Calcium 9.4 8.9 - 10.3 mg/dL   GFR calc non Af Amer 43 (L) >60 mL/min   GFR calc Af Amer 50 (L) >60 mL/min   Anion gap 11 5 - 15  CBC     Status: Abnormal   Collection Time: 07/06/19  7:57 PM  Result Value Ref Range   WBC 9.4 4.0 - 10.5 K/uL   RBC 4.59 4.22 - 5.81 MIL/uL   Hemoglobin 13.0 13.0 - 17.0 g/dL   HCT 56.2 13.0 - 86.5 %   MCV 85.4 80.0 - 100.0 fL   MCH 28.3 26.0 - 34.0 pg   MCHC 33.2 30.0 - 36.0 g/dL   RDW 78.4 69.6 - 29.5 %   Platelets  149 (L) 150 - 400 K/uL   nRBC 0.0 0.0 - 0.2 %  Troponin I (High Sensitivity)     Status: Abnormal   Collection Time: 07/06/19  7:57 PM  Result Value Ref Range   Troponin I (High Sensitivity) 40 (H) <18 ng/L  Lactic acid, plasma     Status: None   Collection Time: 07/06/19  7:57 PM  Result Value Ref Range   Lactic Acid, Venous 1.6 0.5 - 1.9 mmol/L  Urinalysis, Complete w Microscopic     Status: Abnormal   Collection Time: 07/06/19  9:42 PM  Result Value Ref Range   Color, Urine AMBER (A) YELLOW   APPearance CLOUDY (A) CLEAR   Specific Gravity, Urine 1.015 1.005 - 1.030   pH 5.0 5.0 - 8.0   Glucose, UA NEGATIVE NEGATIVE mg/dL   Hgb urine dipstick LARGE (A) NEGATIVE   Bilirubin Urine NEGATIVE NEGATIVE   Ketones, ur NEGATIVE NEGATIVE mg/dL   Protein, ur 284 (A) NEGATIVE mg/dL   Nitrite NEGATIVE NEGATIVE   Leukocytes,Ua MODERATE (A) NEGATIVE   RBC / HPF >50 (H) 0 - 5 RBC/hpf   WBC, UA >50 (H) 0 - 5 WBC/hpf   Bacteria, UA RARE (A) NONE SEEN   Squamous Epithelial / LPF 0-5 0 - 5   WBC Clumps PRESENT    Mucus PRESENT    ____________________________________________  EKG My review and personal interpretation at Time: 20:10 Indication: ams  Rate: 95  Rhythm: sinus Axis: normal Other: anterolateral t wave abn, no stemi ____________________________________________  RADIOLOGY  I personally reviewed all radiographic images ordered to evaluate for the above acute complaints and reviewed radiology reports and findings.  These findings were personally discussed with the patient.  Please see medical record for radiology report.  ____________________________________________   PROCEDURES  Procedure(s) performed:  .Critical Care Performed by: Willy Eddy, MD Authorized by: Willy Eddy, MD   Critical care provider statement:    Critical care time (minutes):  15   Critical care time was exclusive of:  Separately billable procedures and treating other patients   Critical  care was necessary to treat or prevent imminent or life-threatening deterioration of the following conditions:  Sepsis   Critical care was time spent personally by me on the following activities:  Development of treatment plan with patient or surrogate, discussions with consultants, evaluation of patient's response to treatment, examination of patient, obtaining history from patient or surrogate, ordering and performing treatments and interventions, ordering and review of laboratory studies, ordering and review of radiographic studies, pulse oximetry, re-evaluation of patient's condition and review of old charts      Critical Care performed: yes ____________________________________________   INITIAL IMPRESSION / ASSESSMENT AND PLAN / ED COURSE  Pertinent labs & imaging results that were available during  my care of the patient were reviewed by me and considered in my medical decision making (see chart for details).   DDX: Sepsis, UTI, [place], stone, pneumonia, GERD, ACS  Carl Figueroa is a 73 y.o. who presents to the ED with symptoms as described above.  Patient arrives febrile with altered mental status tachycardic.  Does report increased urinary frequency.  Denies any chest pain or pressure.  EKG does show some T wave inversion and troponin is elevated but suspect this is secondary to demand ischemia in the setting of sepsis as he was tachycardic and febrile with altered mental status.  Urinalysis does appear infected.  Will send for urine culture as well as order CT imaging.  CT imaging shows thickened bladder wall no evidence of hydro-.  This point do believe patient should be admitted to the hospital for further observation and medical management.  Have discussed with the patient and available family all diagnostics and treatments performed thus far and all questions were answered to the best of my ability. The patient demonstrates understanding and agreement with plan.      The patient was  evaluated in Emergency Department today for the symptoms described in the history of present illness. He/she was evaluated in the context of the global COVID-19 pandemic, which necessitated consideration that the patient might be at risk for infection with the SARS-CoV-2 virus that causes COVID-19. Institutional protocols and algorithms that pertain to the evaluation of patients at risk for COVID-19 are in a state of rapid change based on information released by regulatory bodies including the CDC and federal and state organizations. These policies and algorithms were followed during the patient's care in the ED.  As part of my medical decision making, I reviewed the following data within the [facility] notes reviewed and incorporated, Labs reviewed, notes from prior ED visits and [place] Controlled Substance Database   ____________________________________________   FINAL CLINICAL IMPRESSION(S) / ED DIAGNOSES  Final diagnoses:  Sepsis with encephalopathy without septic shock, due to unspecified organism ([place])  Cystitis      NEW MEDICATIONS STARTED DURING THIS VISIT:  New Prescriptions   No medications on file     Note:  This document was prepared using Dragon voice recognition software and may include unintentional dictation errors.    Merlyn Lot, MD 07/06/19 Arnoldo Lenis    Merlyn Lot, MD 07/21/19 [phone]

## 2019-07-06 NOTE — ED Triage Notes (Signed)
Pt arrived via [facility] from home where wife reported pt acting "bizarre" this evening. Pt altered on arrival, fever 102.78F, incontinent. EMS reports pt having hematuria per wife today. No falls reported by family.

## 2019-07-06 NOTE — ED Notes (Signed)
Rainbow, gray top and 2 sets of [facility] sent to lab

## 2019-07-06 NOTE — ED Notes (Signed)
Family at bedside. 

## 2019-07-06 NOTE — ED Notes (Signed)
This edt gave this pt meal tray and grape juice per RN request

## 2019-07-06 NOTE — ED Notes (Addendum)
Pt oriented to time, place, and self. Pt disoriented to situation, is aware that he is tired and has been having chills. Patient reports that he knows he came to the hospital by EMS. Pt denying pain at this time. Pt reports no burning when he pees but he has been going more frequently.  Pt with unsteady gait at this time. This RN and Carl Figueroa EDT assisted pt to bed from wheelchair.

## 2019-07-06 NOTE — ED Notes (Signed)
Patient transported to CT 

## 2019-07-07 ENCOUNTER — Inpatient Hospital Stay (HOSPITAL_COMMUNITY)
Admit: 2019-07-07 | Discharge: 2019-07-07 | Disposition: A | Discharge status: Home / Self Care | Payer: No Typology Code available for payment source | Attending: Internal Medicine | Admitting: Internal Medicine

## 2019-07-07 ENCOUNTER — Encounter: Payer: Self-pay | Admitting: Internal Medicine

## 2019-07-07 DIAGNOSIS — A419 Sepsis, unspecified organism: Principal | ICD-10-CM | POA: Diagnosis present

## 2019-07-07 DIAGNOSIS — N179 Acute kidney failure, unspecified: Secondary | ICD-10-CM | POA: Diagnosis present

## 2019-07-07 DIAGNOSIS — N390 Urinary tract infection, site not specified: Secondary | ICD-10-CM | POA: Diagnosis present

## 2019-07-07 DIAGNOSIS — Z79899 Other long term (current) drug therapy: Secondary | ICD-10-CM | POA: Diagnosis not present

## 2019-07-07 DIAGNOSIS — F419 Anxiety disorder, unspecified: Secondary | ICD-10-CM | POA: Diagnosis present

## 2019-07-07 DIAGNOSIS — G9341 Metabolic encephalopathy: Secondary | ICD-10-CM | POA: Diagnosis present

## 2019-07-07 DIAGNOSIS — M545 Low back pain: Secondary | ICD-10-CM | POA: Diagnosis present

## 2019-07-07 DIAGNOSIS — G8929 Other chronic pain: Secondary | ICD-10-CM | POA: Diagnosis present

## 2019-07-07 DIAGNOSIS — D696 Thrombocytopenia, unspecified: Secondary | ICD-10-CM | POA: Diagnosis present

## 2019-07-07 DIAGNOSIS — R778 Other specified abnormalities of plasma proteins: Secondary | ICD-10-CM

## 2019-07-07 DIAGNOSIS — I10 Essential (primary) hypertension: Secondary | ICD-10-CM | POA: Diagnosis present

## 2019-07-07 DIAGNOSIS — Z20828 Contact with and (suspected) exposure to other viral communicable diseases: Secondary | ICD-10-CM | POA: Diagnosis present

## 2019-07-07 DIAGNOSIS — N3091 Cystitis, unspecified with hematuria: Secondary | ICD-10-CM | POA: Diagnosis present

## 2019-07-07 DIAGNOSIS — I248 Other forms of acute ischemic heart disease: Secondary | ICD-10-CM | POA: Diagnosis not present

## 2019-07-07 DIAGNOSIS — R6520 Severe sepsis without septic shock: Secondary | ICD-10-CM | POA: Diagnosis not present

## 2019-07-07 DIAGNOSIS — N178 Other acute kidney failure: Secondary | ICD-10-CM

## 2019-07-07 DIAGNOSIS — F05 Delirium due to known physiological condition: Secondary | ICD-10-CM | POA: Diagnosis present

## 2019-07-07 DIAGNOSIS — F0390 Unspecified dementia without behavioral disturbance: Secondary | ICD-10-CM | POA: Diagnosis present

## 2019-07-07 LAB — BASIC METABOLIC PANEL
Anion gap: 6 (ref 5–15)
BUN: 13 mg/dL (ref 8–23)
CO2: 24 mmol/L (ref 22–32)
Calcium: 8.1 mg/dL — ABNORMAL LOW (ref 8.9–10.3)
Chloride: 114 mmol/L — ABNORMAL HIGH (ref 98–111)
Creatinine, Ser: 1.53 mg/dL — ABNORMAL HIGH (ref 0.61–1.24)
GFR calc Af Amer: 52 mL/min — ABNORMAL LOW (ref >60)
GFR calc non Af Amer: 44 mL/min — ABNORMAL LOW (ref >60)
Glucose, Bld: 103 mg/dL — ABNORMAL HIGH (ref 70–99)
Potassium: 3.7 mmol/L (ref 3.5–5.1)
Sodium: 144 mmol/L (ref 135–145)

## 2019-07-07 LAB — CBC
HCT: 34.2 % — ABNORMAL LOW (ref 39.0–52.0)
HCT: 34.6 % — ABNORMAL LOW (ref 39.0–52.0)
Hemoglobin: 11.2 g/dL — ABNORMAL LOW (ref 13.0–17.0)
Hemoglobin: 11.3 g/dL — ABNORMAL LOW (ref 13.0–17.0)
MCH: 28.3 pg (ref 26.0–34.0)
MCH: 28.2 pg (ref 26.0–34.0)
MCHC: 32.7 g/dL (ref 30.0–36.0)
MCHC: 32.7 g/dL (ref 30.0–36.0)
MCV: 86.4 fL (ref 80.0–100.0)
MCV: 86.3 fL (ref 80.0–100.0)
Platelets: 131 10*3/uL — ABNORMAL LOW (ref 150–400)
Platelets: 131 10*3/uL — ABNORMAL LOW (ref 150–400)
RBC: 3.96 MIL/uL — ABNORMAL LOW (ref 4.22–5.81)
RBC: 4.01 MIL/uL — ABNORMAL LOW (ref 4.22–5.81)
RDW: 12.9 % (ref 11.5–15.5)
RDW: 12.9 % (ref 11.5–15.5)
WBC: 16.1 10*3/uL — ABNORMAL HIGH (ref 4.0–10.5)
WBC: 11.5 10*3/uL — ABNORMAL HIGH (ref 4.0–10.5)
nRBC: 0 % (ref 0.0–0.2)
nRBC: 0 % (ref 0.0–0.2)

## 2019-07-07 LAB — HEPATIC FUNCTION PANEL
ALT: 13 U/L (ref 0–44)
AST: 43 U/L — ABNORMAL HIGH (ref 15–41)
Albumin: 2.8 g/dL — ABNORMAL LOW (ref 3.5–5.0)
Alkaline Phosphatase: 57 U/L (ref 38–126)
Bilirubin, Direct: 0.1 mg/dL (ref 0.0–0.2)
Indirect Bilirubin: 0.8 mg/dL (ref 0.3–0.9)
Total Bilirubin: 0.9 mg/dL (ref 0.3–1.2)
Total Protein: 5.3 g/dL — ABNORMAL LOW (ref 6.5–8.1)

## 2019-07-07 LAB — CREATININE, SERUM
Creatinine, Ser: 1.6 mg/dL — ABNORMAL HIGH (ref 0.61–1.24)
GFR calc Af Amer: 49 mL/min — ABNORMAL LOW (ref >60)
GFR calc non Af Amer: 42 mL/min — ABNORMAL LOW (ref >60)

## 2019-07-07 LAB — TROPONIN I (HIGH SENSITIVITY)
Troponin I (High Sensitivity): 180 ng/L (ref <18)
Troponin I (High Sensitivity): 180 ng/L (ref <18)

## 2019-07-07 LAB — SARS CORONAVIRUS 2 (TAT 6-24 HRS): SARS Coronavirus 2: NEGATIVE

## 2019-07-07 MED ORDER — ENOXAPARIN SODIUM 40 MG/0.4ML ~~LOC~~ SOLN: 40.0000 mg | SUBCUTANEOUS | Status: DC

## 2019-07-07 MED ORDER — ASPIRIN EC 81 MG PO TBEC
81.0000 mg | DELAYED_RELEASE_TABLET | Freq: Every day | ORAL | Status: DC
  Administered 2019-07-07 – 2019-07-09 (×3): 81 mg via ORAL
  Filled 2019-07-07 (×3): qty 1

## 2019-07-07 MED ORDER — ONDANSETRON HCL 4 MG/2ML IJ SOLN: 4.0000 mg | Freq: Four times a day (QID) | INTRAMUSCULAR | Status: DC | PRN

## 2019-07-07 MED ORDER — BACLOFEN 10 MG PO TABS
10.0000 mg | ORAL_TABLET | Freq: Every day | ORAL | Status: DC
  Administered 2019-07-08: 21:00:00 10 mg via ORAL
  Filled 2019-07-07 (×2): qty 1

## 2019-07-07 MED ORDER — SODIUM CHLORIDE 0.9 % IV SOLN
2.0000 g | INTRAVENOUS | Status: DC
  Administered 2019-07-07 – 2019-07-08 (×2): 2 g via INTRAVENOUS
  Filled 2019-07-07: qty 2
  Filled 2019-07-07: qty 20
  Filled 2019-07-07: qty 2

## 2019-07-07 MED ORDER — LACTATED RINGERS IV SOLN
INTRAVENOUS | Status: AC
  Administered 2019-07-07 (×3): via INTRAVENOUS

## 2019-07-07 MED ORDER — ATORVASTATIN CALCIUM 20 MG PO TABS
80.0000 mg | ORAL_TABLET | Freq: Every day | ORAL | Status: DC
  Administered 2019-07-07: 80 mg via ORAL
  Filled 2019-07-07: qty 1
  Filled 2019-07-07: qty 4

## 2019-07-07 MED ORDER — ONDANSETRON HCL 4 MG PO TABS
4.0000 mg | ORAL_TABLET | Freq: Four times a day (QID) | ORAL | Status: DC | PRN
  Filled 2019-07-07: qty 1

## 2019-07-07 MED ORDER — CLONAZEPAM 0.5 MG PO TABS
0.2500 mg | ORAL_TABLET | Freq: Three times a day (TID) | ORAL | Status: DC
  Administered 2019-07-07 – 2019-07-09 (×6): 0.25 mg via ORAL
  Filled 2019-07-07 (×6): qty 1

## 2019-07-07 MED ORDER — ACETAMINOPHEN 325 MG PO TABS
650.0000 mg | ORAL_TABLET | Freq: Four times a day (QID) | ORAL | Status: DC | PRN
  Administered 2019-07-07 – 2019-07-09 (×2): 650 mg via ORAL
  Filled 2019-07-07 (×2): qty 2

## 2019-07-07 MED ORDER — ACETAMINOPHEN 650 MG RE SUPP: 650.0000 mg | Freq: Four times a day (QID) | RECTAL | Status: DC | PRN

## 2019-07-07 NOTE — ED Notes (Signed)
Warm blankets given per pt request. 

## 2019-07-07 NOTE — Consult Note (Signed)
Cardiology Consultation:   Patient ID: Carl Figueroa MRN: [id number]; DOB: Sep 21, 1945  Admit date: 07/06/2019 Date of Consult: 07/07/2019  Primary Care Provider: [facility] Primary Cardiologist: New - Mickel Schreur Primary Electrophysiologist:  None    Patient Profile:   Carl Figueroa is a 73 y.o. 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 Wyonia Hough.  History of Present Illness:   Carl Figueroa was in his usual state of health until yesterday morning.  He had a general rundown feeling for much of the day.  He returned home from [place] in the afternoon and had an episode of blood-tinged urine.  He did not have any pelvic pain or dysuria.  Few hours later, his wife noted him to be confused.  Carl Figueroa does not recall much of the evening, noting only that he felt very cold and tired.  His wife called EMS, who found Carl Figueroa to be altered.  He does recall EMS arriving at his home.  At the time, he felt like his heart was pounding rapidly.  He also had mild shortness of breath.  In the emergency department, he was noted to be febrile and tachycardic (T-max 102.3 F).  He was treated for sepsis and now feels essentially back to normal.  Carl Figueroa denies a history of heart disease.  Other than mild shortness of breath yesterday, he has not had any dyspnea.  He also denies chest pain, palpitations, lightheadedness, orthopnea, PND, and edema.  Heart Pathway Score:     Past Medical History:  Diagnosis Date   Hypertension     Past Surgical History:  Procedure Laterality Date   ankle surgry       Home Medications:  Prior to Admission medications   Medication Sig Start Date Lula [place] Date Taking? Authorizing Provider  baclofen (LIORESAL) 10 MG tablet Take 10 mg by mouth at bedtime.   Yes [provider]  clonazePAM (KLONOPIN) 0.5 MG  tablet Take 0.25 mg by mouth 3 (three) times daily.    Yes [provider]  lisinopril (ZESTRIL) 40 MG tablet Take 20 mg by mouth daily.    Yes [provider]  naproxen (NAPROSYN) 500 MG tablet Take 500 mg by mouth 2 (two) times daily with a meal.   Yes [provider]    Inpatient Medications: Scheduled Meds:  aspirin EC  81 mg Oral Daily   [START ON 07/08/2019] baclofen  10 mg Oral QHS   clonazePAM  0.25 mg Oral TID   enoxaparin (LOVENOX) injection  40 mg Subcutaneous Q24H   Continuous Infusions:  cefTRIAXone (ROCEPHIN)  IV     lactated ringers 100 mL/hr at 07/07/19 1149   PRN Meds: acetaminophen **OR** acetaminophen, ondansetron **OR** ondansetron (ZOFRAN) IV  Allergies:    Allergies  Allergen Reactions   Morphine Nausea Only    Social History:   Social History   Tobacco Use   Smoking status: Never Smoker   Smokeless tobacco: Never Used  Substance Use Topics   Alcohol use: No    Frequency: Never   Drug use: No    Family History:   Mother had cancer.  Father died in his 37s (unknown cause).  ROS:  Please see the history of present illness. All other ROS reviewed and negative.     Physical Exam/Data:   Vitals:   07/07/19 0400 07/07/19  0505 07/07/19 0823 07/07/19 1158  BP: 125/62 130/60 127/69 123/63  Pulse: 66 67 74 66  Resp: (!) 23 18  (!) 24  Temp:  99 F (37.2 C) 98.3 F (36.8 C) 98 F (36.7 C)  TempSrc:  Oral Oral Oral  SpO2: 100% 100% 96% 99%  Weight:      Height:        Intake/Output Summary (Last 24 hours) at 07/07/2019 1408 Last data filed at 07/07/2019 0900 Gross per 24 hour  Intake 635 ml  Output 250 ml  Net 385 ml   Last 3 Weights 07/06/2019  Weight (lbs) 165 lb  Weight (kg) 74.844 kg     Body mass index is 22.38 kg/m.  General:  Well nourished, well developed, in no acute distress.  He is accompanied by his wife. HEENT: normal Lymph: no adenopathy Neck: no JVD Endocrine:  No  thryomegaly Vascular: No carotid bruits; 2+ radial and pedal pulses bilaterally. Cardiac: Regular rate and rhythm with 2/6 holosystolic murmur loudest at the left lower sternal border.  No rubs or gallops. Lungs: Normal work of breathing.  Mildly diminished breath sounds at the left lung base.  No wheezes or crackles. Abd: soft, nontender, no hepatomegaly  Ext: no edema Musculoskeletal:  No deformities, BUE and BLE strength normal and equal Skin: warm and dry  Neuro:  CNs 2-12 intact, no focal abnormalities noted Psych:  Normal affect   EKG:  The EKG was personally reviewed and demonstrates: Normal sinus rhythm with LVH.  Lateral T wave inversions most likely represent abnormal repolarization, though ischemia cannot be excluded.  Relevant CV Studies: None.  Laboratory Data:  High Sensitivity Troponin:   Recent Labs  Lab 07/06/19 1957 07/07/19 0137 07/07/19 0325  TROPONINIHS 40* 180* 180*     Chemistry Recent Labs  Lab 07/06/19 1957 07/07/19 0133 07/07/19 0507  NA 145  --  144  K 3.6  --  3.7  CL 108  --  114*  CO2 26  --  24  GLUCOSE 119*  --  103*  BUN 11  --  13  CREATININE 1.56* 1.60* 1.53*  CALCIUM 9.4  --  8.1*  GFRNONAA 43* 42* 44*  GFRAA 50* 49* 52*  ANIONGAP 11  --  6    Recent Labs  Lab 07/07/19 0137  PROT 5.3*  ALBUMIN 2.8*  AST 43*  ALT 13  ALKPHOS 57  BILITOT 0.9   Hematology Recent Labs  Lab 07/06/19 1957 07/07/19 0133 07/07/19 0507  WBC 9.4 16.1* 11.5*  RBC 4.59 3.96* 4.01*  HGB 13.0 11.2* 11.3*  HCT 39.2 34.2* 34.6*  MCV 85.4 86.4 86.3  MCH 28.3 28.3 28.2  MCHC 33.2 32.7 32.7  RDW 12.8 12.9 12.9  PLT 149* 131* 131*   BNPNo results for input(s): BNP, PROBNP in the last 168 hours.  DDimer No results for input(s): DDIMER in the last 168 hours.   Radiology/Studies:  Dg Chest 2 View  Result Date: 07/06/2019 CLINICAL DATA:  Fever, right chest pain EXAM: CHEST - 2 VIEW COMPARISON:  None. FINDINGS: Slight elevation of the left  hemidiaphragm with left base atelectasis. Right lung clear. Heart is normal size. No effusions. No acute bony abnormality. IMPRESSION: Minimal left base atelectasis. Electronically Signed   By: Charlett NoseKevin  Dover M.D.   On: 07/06/2019 21:03   Ct Renal Stone Study  Addendum Date: 07/06/2019   ADDENDUM REPORT: 07/06/2019 23:08 ADDENDUM: Advanced arthritis of the left hip with severe joint space narrowing and prominent  subarticular cystic change Electronically Signed   By: Jasmine Pang M.D.   On: 07/06/2019 23:08   Result Date: 07/06/2019 CLINICAL DATA:  Frequency hematuria flank pain EXAM: CT ABDOMEN AND PELVIS WITHOUT CONTRAST TECHNIQUE: Multidetector CT imaging of the abdomen and pelvis was performed following the standard protocol without IV contrast. COMPARISON:  None. FINDINGS: Lower chest: Lung bases demonstrate no acute consolidation or effusion. Elevation of left diaphragm with mild left pleural thickening. Heart size within normal limits. Hepatobiliary: No focal liver abnormality is seen. No gallstones, gallbladder wall thickening, or biliary dilatation. Pancreas: Unremarkable. No pancreatic ductal dilatation or surrounding inflammatory changes. Spleen: Normal in size without focal abnormality. Adrenals/Urinary Tract: Adrenal glands are normal. No hydronephrosis. Negative for ureteral stone. Slightly dense appearing bladder wall with marked wall thickening and inflammatory changes. Stomach/Bowel: Stomach is within normal limits. Appendix not well seen but no right lower quadrant inflammatory process. No evidence of bowel wall thickening, distention, or inflammatory changes. Large formed feces at the rectum. Vascular/Lymphatic: Mild aortic atherosclerosis. No aneurysm. No significantly enlarged lymph nodes. Reproductive: Slightly enlarged prostate. Other: No free air or free fluid.  Small fat in the umbilical region Musculoskeletal: Grade 1 anterolisthesis L4 on L5. Prominent degenerative changes of the  lumbosacral spine. IMPRESSION: 1. Slightly dense appearing bladder wall which demonstrates marked wall thickening and surrounding inflammatory changes, suspicious for an acute cystitis. Negative for hydronephrosis or ureteral stone. 2. Large amount of stool in the colon with large formed feces at the rectum suggesting constipation. Electronically Signed: By: Jasmine Pang M.D. On: 07/06/2019 23:01    Assessment and Plan:   Elevated troponin: High-sensitivity troponin is mildly elevated, having trended from 40->180->180.  Carl Figueroa denies chest pain.  I suspect that his troponin elevation represents supply-demand mismatch in the setting of sepsis.  I do not believe this is ACS.  EKG changes are nonspecific and very well may reflect abnormal repolarization in the setting of LVH.  Obtain transthoracic echocardiogram.  If EF is preserved and there is no regional wall motion abnormality, I would favor outpatient myocardial perfusion stress test once the patient has recovered from his urinary tract infection.  As long as there is no worsening hematuria, continue aspirin 81 mg daily.  Obtain fasting lipid panel.  Add atorvastatin 80 mg daily while awaiting lipid panel.  Check hemoglobin A1c.  Heart murmur: Systolic murmur noted on exam today.  No signs or symptoms of heart failure.  Obtain echocardiogram.  Sepsis: Most likely due to urinary tract infection.  Diminished breath sounds at the left lung base noted; chest radiograph yesterday suggests atelectasis.  Antimicrobial therapy per internal medicine.  Hypertension: Blood pressure normal at this time.  Continue to hold lisinopril in the setting of sepsis.  Restart as blood pressure and renal function tolerate.  For questions or updates, please contact [facility] Please consult [email or web address] for contact info under [facility].  Signed, Yvonne Kendall, MD  07/07/2019 2:08 PM

## 2019-07-07 NOTE — ED Notes (Signed)
Pt resting in bed, wife at bedside, no distress noted.

## 2019-07-07 NOTE — Progress Notes (Signed)
PROGRESS NOTE    Carl Figueroa  ZOX:[id number] DOB: September 13, 1945 DOA: 07/06/2019 PCP: [facility]   Brief Narrative:  73 year old male with history of hypertension, chronic back pain who presented to the ER at the patient's wife found that he was confused and incontinent.  In the ER patient was initially confused but improved.  He was noted have a T-max of 102.3.  CT renal study showed cystitis.  Patient was given fluids antibiotics and consult with the hospital service.   Assessment & Plan:   Principal Problem:   Sepsis (HCC) Active Problems:   Essential hypertension   Acute lower UTI   ARF (acute renal failure) (HCC)   1. Sepsis secondary UTI -continue with ceftriaxone follow cultures adjust antibiotics per speciation and sensitivities, continue hydration. 2. Acute encephalopathy secondary to sepsis has improved.  Continue to monitor.  Appears nonfocal on my evaluation's morning appears to resolved 3. Hematuria will need further work-up as outpatient and follow-up with urologist or primary care. 4. Thrombocytopenia no old labs to compare.  Follow CBC. 5. Acute renal failure -no old labs to compare.  Will continue to hold lisinopril for now and gently hydrate and follow metabolic panel.  Patient also take NSAIDs which will be held. 6. Mildly elevated high-sensitivity troponin.  Likely from sepsis patient denies any chest pain.    Trended cardiac enzymes seen by cardiology, recommend echocardiogram, lipid panel and statin as well as aspirin if hematuria does not worsen 7. Hypertension holding lisinopril due to acute renal failure.  As needed IV hydralazine. 8. History of anxiety and low back pain on baclofen and Klonopin.  If creatinine worsens may have to hold baclofen.  DVT prophylaxis: SCD/Compression stockings  Code Status: Full    Code Status Orders  (From admission, onward)         Start     Ordered   07/07/19 0107  Full code  Continuous     07/07/19 0108        Code Status History    This patient has a current code status but no historical code status.   Advance Care Planning Activity     Family Communication: none today Disposition Plan:   Patient will remain inpatient for continued treatment of sepsis with UTI Consults called: None Admission status: Inpatient   Consultants:   None  Procedures:  Dg Chest 2 View  Result Date: 07/06/2019 CLINICAL DATA:  Fever, right chest pain EXAM: CHEST - 2 VIEW COMPARISON:  None. FINDINGS: Slight elevation of the left hemidiaphragm with left base atelectasis. Right lung clear. Heart is normal size. No effusions. No acute bony abnormality. IMPRESSION: Minimal left base atelectasis. Electronically Signed   By: Charlett NoseKevin  Dover M.D.   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   On: 07/06/2019 23:08   Result Date: 07/06/2019 CLINICAL DATA:  Frequency hematuria flank pain EXAM: CT ABDOMEN AND PELVIS WITHOUT CONTRAST TECHNIQUE: Multidetector CT imaging of the abdomen and pelvis was performed following the standard protocol without IV contrast. COMPARISON:  None. FINDINGS: Lower chest: Lung bases demonstrate no acute consolidation or effusion. Elevation of left diaphragm with mild left pleural thickening. Heart size within normal limits. Hepatobiliary: No focal liver abnormality is seen. No gallstones, gallbladder wall thickening, or biliary dilatation. Pancreas: Unremarkable. No  pancreatic ductal dilatation or surrounding inflammatory changes. Spleen: Normal in size without focal abnormality. Adrenals/Urinary Tract: Adrenal glands are normal. No hydronephrosis. Negative for ureteral stone. Slightly dense appearing bladder wall with marked wall thickening and inflammatory changes.  Stomach/Bowel: Stomach is within normal limits. Appendix not well seen but no right lower quadrant inflammatory process. No evidence of bowel wall thickening, distention, or inflammatory changes. Large formed feces at the rectum. Vascular/Lymphatic: Mild aortic atherosclerosis. No aneurysm. No significantly enlarged lymph nodes. Reproductive: Slightly enlarged prostate. Other: No free air or free fluid.  Small fat in the umbilical region Musculoskeletal: Grade 1 anterolisthesis L4 on L5. Prominent degenerative changes of the lumbosacral spine. IMPRESSION: 1. Slightly dense appearing bladder wall which demonstrates marked wall thickening and surrounding inflammatory changes, suspicious for an acute cystitis. Negative for hydronephrosis or ureteral stone. 2. Large amount of stool in the colon with large formed feces at the rectum suggesting constipation. Electronically Signed: By: Jasmine Pang M.D. On: 07/06/2019 23:01     Antimicrobials:   Ceftriaxone day 2   Subjective: Patient doing well mental status improving  Objective: Vitals:   07/07/19 0400 07/07/19 0505 07/07/19 0823 07/07/19 1158  BP: 125/62 130/60 127/69 123/63  Pulse: 66 67 74 66  Resp: (!) 23 18  (!) 24  Temp:  99 F (37.2 C) 98.3 F (36.8 C) 98 F (36.7 C)  TempSrc:  Oral Oral Oral  SpO2: 100% 100% 96% 99%  Weight:      Height:        Intake/Output Summary (Last 24 hours) at 07/07/2019 1440 Last data filed at 07/07/2019 1400 Gross per 24 hour  Intake 1878.26 ml  Output 250 ml  Net 1628.26 ml   Filed Weights   07/06/19 2201  Weight: 74.8 kg    Examination:  General exam: Appears calm and comfortable  Respiratory system: Clear to auscultation. Respiratory effort normal. Cardiovascular system: S1 & S2 heard, RRR. No JVD, murmurs, rubs, gallops or clicks. No pedal edema. Gastrointestinal system: Abdomen is nondistended, soft and nontender. No organomegaly or masses felt. Normal bowel sounds heard. Central  nervous system: Alert and oriented. No focal neurological deficits. Extremities: Warm well perfused no edema. Skin: No rashes, lesions or ulcers Psychiatry: Judgement and insight appear normal. Mood & affect appropriate.     Data Reviewed: I have personally reviewed following labs and imaging studies  CBC: Recent Labs  Lab 07/06/19 1957 07/07/19 0133 07/07/19 0507  WBC 9.4 16.1* 11.5*  HGB 13.0 11.2* 11.3*  HCT 39.2 34.2* 34.6*  MCV 85.4 86.4 86.3  PLT 149* 131* 131*   Basic Metabolic Panel: Recent Labs  Lab 07/06/19 1957 07/07/19 0133 07/07/19 0507  NA 145  --  144  K 3.6  --  3.7  CL 108  --  114*  CO2 26  --  24  GLUCOSE 119*  --  103*  BUN 11  --  13  CREATININE 1.56* 1.60* 1.53*  CALCIUM 9.4  --  8.1*   GFR: Estimated Creatinine Clearance: 45.5 mL/min (A) (by C-G formula based on SCr of 1.53 mg/dL (H)). Liver Function Tests: Recent Labs  Lab 07/07/19 0137  AST 43*  ALT 13  ALKPHOS 57  BILITOT 0.9  PROT 5.3*  ALBUMIN 2.8*   No results for input(s): LIPASE, AMYLASE in the last 168 hours. No results for input(s): AMMONIA in the last 168 hours. Coagulation Profile: No results for input(s): INR, PROTIME in the last 168 hours. Cardiac Enzymes: No results for  input(s): CKTOTAL, CKMB, CKMBINDEX, TROPONINI in the last 168 hours. BNP (last 3 results) No results for input(s): PROBNP in the last 8760 hours. HbA1C: No results for input(s): HGBA1C in the last 72 hours. CBG: No results for input(s): GLUCAP in the last 168 hours. Lipid Profile: No results for input(s): CHOL, HDL, LDLCALC, TRIG, CHOLHDL, LDLDIRECT in the last 72 hours. Thyroid Function Tests: No results for input(s): TSH, T4TOTAL, FREET4, T3FREE, THYROIDAB in the last 72 hours. Anemia Panel: No results for input(s): VITAMINB12, FOLATE, FERRITIN, TIBC, IRON, RETICCTPCT in the last 72 hours. Sepsis Labs: Recent Labs  Lab 07/06/19 1957  LATICACIDVEN 1.6    Recent Results (from the past 240  hour(s))  Blood culture (routine x 2)     Status: None (Preliminary result)   Collection Time: 07/06/19  7:57 PM   Specimen: BLOOD  Result Value Ref Range Status   Specimen Description BLOOD RIGHT ANTECUBITAL  Final   Special Requests   Final    BOTTLES DRAWN AEROBIC AND ANAEROBIC Blood Culture results may not be optimal due to an excessive volume of blood received in culture bottles   Culture   Final    NO GROWTH < 12 HOURS Performed at [facility], [address], [place], [place] [address]    Report Status PENDING  Incomplete  Blood culture (routine x 2)     Status: None (Preliminary result)   Collection Time: 07/06/19  7:57 PM   Specimen: BLOOD  Result Value Ref Range Status   Specimen Description BLOOD LEFT ANTECUBITAL  Final   Special Requests   Final    BOTTLES DRAWN AEROBIC AND ANAEROBIC Blood Culture results may not be optimal due to an excessive volume of blood received in culture bottles   Culture   Final    NO GROWTH < 12 HOURS Performed at [facility], [address] [place], [place] [address]    Report Status PENDING  Incomplete  SARS CORONAVIRUS 2 (TAT 6-24 HRS) Nasopharyngeal Nasopharyngeal Swab     Status: None   Collection Time: 07/06/19 11:44 PM   Specimen: Nasopharyngeal Swab  Result Value Ref Range Status   SARS Coronavirus 2 NEGATIVE NEGATIVE Final    Comment: (NOTE) SARS-CoV-2 target nucleic acids are NOT DETECTED. The SARS-CoV-2 RNA is generally detectable in upper and lower respiratory specimens during the acute phase of infection. Negative results do not preclude SARS-CoV-2 infection, do not rule out co-infections with other pathogens, and should not be used as the sole basis for treatment or other patient management decisions. Negative results must be combined with clinical observations, patient history, and epidemiological information. The expected result is Negative. Fact Sheet for  Patients: HairSlick.no Fact Sheet for Healthcare Providers: quierodirigir.com This test is not yet approved or cleared by the [place] FDA and  has been authorized for detection and/or diagnosis of SARS-CoV-2 by FDA under an Emergency Use Authorization (EUA). This EUA will remain  in effect (meaning this test can be used) for the duration of the COVID-19 declaration under Section 56 4(b)(1) of the Act, 21 U.S.C. section 360bbb-3(b)(1), unless the authorization is terminated or revoked sooner. Performed at [facility], [address] [place], [place] [address]          Radiology Studies: Dg Chest 2 View  Result Date: 07/06/2019 CLINICAL DATA:  Fever, right chest pain EXAM: CHEST - 2 VIEW COMPARISON:  None. FINDINGS: Slight elevation of the left hemidiaphragm with left base atelectasis. Right lung clear.  Heart is normal size. No effusions. No acute bony abnormality. IMPRESSION: Minimal left base atelectasis. Electronically Signed   By: Rolm Baptise M.D.   On: 07/06/2019 21:03   Ct Renal Stone Study  Addendum Date: 07/06/2019   ADDENDUM REPORT: 07/06/2019 23:08 ADDENDUM: Advanced arthritis of the left hip with severe joint space narrowing and prominent subarticular cystic change Electronically Signed   By: Donavan Foil M.D.   On: 07/06/2019 23:08   Result Date: 07/06/2019 CLINICAL DATA:  Frequency hematuria flank pain EXAM: CT ABDOMEN AND PELVIS WITHOUT CONTRAST TECHNIQUE: Multidetector CT imaging of the abdomen and pelvis was performed following the standard protocol without IV contrast. COMPARISON:  None. FINDINGS: Lower chest: Lung bases demonstrate no acute consolidation or effusion. Elevation of left diaphragm with mild left pleural thickening. Heart size within normal limits. Hepatobiliary: No focal liver abnormality is seen. No gallstones, gallbladder wall thickening, or biliary dilatation. Pancreas: Unremarkable.  No pancreatic ductal dilatation or surrounding inflammatory changes. Spleen: Normal in size without focal abnormality. Adrenals/Urinary Tract: Adrenal glands are normal. No hydronephrosis. Negative for ureteral stone. Slightly dense appearing bladder wall with marked wall thickening and inflammatory changes. Stomach/Bowel: Stomach is within normal limits. Appendix not well seen but no right lower quadrant inflammatory process. No evidence of bowel wall thickening, distention, or inflammatory changes. Large formed feces at the rectum. Vascular/Lymphatic: Mild aortic atherosclerosis. No aneurysm. No significantly enlarged lymph nodes. Reproductive: Slightly enlarged prostate. Other: No free air or free fluid.  Small fat in the umbilical region Musculoskeletal: Grade 1 anterolisthesis L4 on L5. Prominent degenerative changes of the lumbosacral spine. IMPRESSION: 1. Slightly dense appearing bladder wall which demonstrates marked wall thickening and surrounding inflammatory changes, suspicious for an acute cystitis. Negative for hydronephrosis or ureteral stone. 2. Large amount of stool in the colon with large formed feces at the rectum suggesting constipation. Electronically Signed: By: Donavan Foil M.D. On: 07/06/2019 23:01        Scheduled Meds:  aspirin EC  81 mg Oral Daily   [START ON 07/08/2019] baclofen  10 mg Oral QHS   clonazePAM  0.25 mg Oral TID   enoxaparin (LOVENOX) injection  40 mg Subcutaneous Q24H   Continuous Infusions:  cefTRIAXone (ROCEPHIN)  IV     lactated ringers 100 mL/hr at 07/07/19 1400     LOS: 0 days    Time spent: 9 min    Nicolette Bang, MD [facility]  If 7PM-7AM, please contact night-coverage  07/07/2019, 2:40 PM

## 2019-07-07 NOTE — Progress Notes (Signed)
PHARMACY - PHYSICIAN COMMUNICATION CRITICAL VALUE ALERT - BLOOD CULTURE IDENTIFICATION (BCID)  Carl Figueroa is an 73 y.o. male who presented to [facility] on 07/06/2019 with a chief complaint of sepsis 2/2 UTI.   Assessment:  GPC 1/4 bottles (aerobic), Per microbiology results called into pharmacy earlier today. It is unclear if MD was contacted when results were called in. Could be a contaminant. WBC 11.5 (down from this morning) and afebrile in the past 24 hours.   Name of physician (or Provider) Contacted: Rufina Falco, NP  Current antibiotics: Ceftriaxone.   Changes to prescribed antibiotics recommended:  Per Provider, continue ceftriaxone for now given patient has improved and follow up with attending's recommendation tomorrow.   No results found for this or any previous visit.  Rowland Lathe 07/07/2019  7:32 PM

## 2019-07-07 NOTE — H&P (Signed)
History and Physical    Carl Figueroa VZD:[id number] DOB: 1945/09/24 DOA: 07/06/2019  PCP: [facility]  Patient coming from: Home.  Chief Complaint: Confusion.  HPI: Carl Figueroa is a 73 y.o. male with history of hypertension and chronic back pain presents to the ER after patient's wife found that patient was confused and incontinent of urine.  Does also report that patient had some hematuria.  Patient denies any shortness of breath chest pain nausea vomiting or diarrhea after he became more alert awake at the time of my exam.  ED Course: In the ER as per the ER patient patient was confused initially and febrile with temperature 102 F septic picture with UA showing hematuria and features concerning for UTI.  CT renal study shows cystitis.  Patient was given fluid bolus blood cultures obtained along with urine culture chest x-ray was unremarkable COVID-19 is pending.  Patient started on ceftriaxone for UTI with sepsis admitted for further observation rather time of my exam patient is completely alert awake.  Labs reveal a platelets of 149 creatinine 1.5 hemoglobin 13.  Review of Systems: As per HPI, rest all negative.   Past Medical History:  Diagnosis Date  . Hypertension     Past Surgical History:  Procedure Laterality Date  . ankle surgry       reports that he has never smoked. He has never used smokeless tobacco. He reports that he does not drink alcohol or use drugs.  Allergies  Allergen Reactions  . Morphine Nausea Only    Family History  Problem Relation Age of Onset  . Diabetes Mellitus II Neg Hx     Prior to Admission medications   Medication Sig Start Date End Date Taking? Authorizing Provider  baclofen (LIORESAL) 10 MG tablet Take 10 mg by mouth at bedtime.   Yes [provider]  clonazePAM (KLONOPIN) 0.5 MG tablet Take 0.25 mg by mouth 3 (three) times daily.    Yes [provider]  lisinopril (ZESTRIL) 40 MG tablet Take 20 mg by mouth  daily.    Yes [provider]  naproxen (NAPROSYN) 500 MG tablet Take 500 mg by mouth 2 (two) times daily with a meal.   Yes [provider]    Physical Exam: Constitutional: Moderately built and nourished. Vitals:   07/06/19 2230 07/06/19 2300 07/06/19 2330 07/07/19 0030  BP: 129/67 123/63 118/63 133/67  Pulse: 92 85 81 86  Resp: (!) 26 (!) 21  18  Temp:      TempSrc:      SpO2: 98% 97% 100% 100%  Weight:      Height:       Eyes: Anicteric no pallor. ENMT: No discharge from the ears eyes nose or mouth. Neck: No mass felt.  No neck rigidity. Respiratory: No rhonchi or crepitations. Cardiovascular: S1-S2 heard. Abdomen: Soft nontender bowel sounds present. Musculoskeletal: No edema.  No joint effusion. Skin: No rash. Neurologic: Alert awake oriented to time place and person.  Moves all extremities. Psychiatric: Appears normal per normal affect.   Labs on Admission: I have personally reviewed following labs and imaging studies  CBC: Recent Labs  Lab 07/06/19 1957  WBC 9.4  HGB 13.0  HCT 39.2  MCV 85.4  PLT 149*   Basic Metabolic Panel: Recent Labs  Lab 07/06/19 1957  NA 145  K 3.6  CL 108  CO2 26  GLUCOSE 119*  BUN 11  CREATININE 1.56*  CALCIUM 9.4   GFR: Estimated Creatinine Clearance:  44.6 mL/min (A) (by C-G formula based on SCr of 1.56 mg/dL (H)). Liver Function Tests: No results for input(s): AST, ALT, ALKPHOS, BILITOT, PROT, ALBUMIN in the last 168 hours. No results for input(s): LIPASE, AMYLASE in the last 168 hours. No results for input(s): AMMONIA in the last 168 hours. Coagulation Profile: No results for input(s): INR, PROTIME in the last 168 hours. Cardiac Enzymes: No results for input(s): CKTOTAL, CKMB, CKMBINDEX, TROPONINI in the last 168 hours. BNP (last 3 results) No results for input(s): PROBNP in the last 8760 hours. HbA1C: No results for input(s): HGBA1C in the last 72 hours. CBG: No results for input(s): GLUCAP in  the last 168 hours. Lipid Profile: No results for input(s): CHOL, HDL, LDLCALC, TRIG, CHOLHDL, LDLDIRECT in the last 72 hours. Thyroid Function Tests: No results for input(s): TSH, T4TOTAL, FREET4, T3FREE, THYROIDAB in the last 72 hours. Anemia Panel: No results for input(s): VITAMINB12, FOLATE, FERRITIN, TIBC, IRON, RETICCTPCT in the last 72 hours. Urine analysis:    Component Value Date/Time   COLORURINE AMBER (A) 07/06/2019 2142   APPEARANCEUR CLOUDY (A) 07/06/2019 2142   LABSPEC 1.015 07/06/2019 2142   PHURINE 5.0 07/06/2019 2142   GLUCOSEU NEGATIVE 07/06/2019 2142   HGBUR LARGE (A) 07/06/2019 2142   BILIRUBINUR NEGATIVE 07/06/2019 2142   [place] 07/06/2019 2142   PROTEINUR 100 (A) 07/06/2019 2142   NITRITE NEGATIVE 07/06/2019 2142   LEUKOCYTESUR MODERATE (A) 07/06/2019 2142   Sepsis Labs: @LABRCNTIP (procalcitonin:4,lacticidven:4) )No results found for this or any previous visit (from the past 240 hour(s)).   Radiological Exams on Admission: Dg Chest 2 View  Result Date: 07/06/2019 CLINICAL DATA:  Fever, right chest pain EXAM: CHEST - 2 VIEW COMPARISON:  None. FINDINGS: Slight elevation of the left hemidiaphragm with left base atelectasis. Right lung clear. Heart is normal size. No effusions. No acute bony abnormality. IMPRESSION: Minimal left base atelectasis. Electronically Signed   By: Rolm Baptise M.D.   On: 07/06/2019 21:03   Ct Renal Stone Study  Addendum Date: 07/06/2019   ADDENDUM REPORT: 07/06/2019 23:08 ADDENDUM: Advanced arthritis of the left hip with severe joint space narrowing and prominent subarticular cystic change Electronically Signed   By: Donavan Foil M.D.   On: 07/06/2019 23:08   Result Date: 07/06/2019 CLINICAL DATA:  Frequency hematuria flank pain EXAM: CT ABDOMEN AND PELVIS WITHOUT CONTRAST TECHNIQUE: Multidetector CT imaging of the abdomen and pelvis was performed following the standard protocol without IV contrast. COMPARISON:  None.  FINDINGS: Lower chest: Lung bases demonstrate no acute consolidation or effusion. Elevation of left diaphragm with mild left pleural thickening. Heart size within normal limits. Hepatobiliary: No focal liver abnormality is seen. No gallstones, gallbladder wall thickening, or biliary dilatation. Pancreas: Unremarkable. No pancreatic ductal dilatation or surrounding inflammatory changes. Spleen: Normal in size without focal abnormality. Adrenals/Urinary Tract: Adrenal glands are normal. No hydronephrosis. Negative for ureteral stone. Slightly dense appearing bladder wall with marked wall thickening and inflammatory changes. Stomach/Bowel: Stomach is within normal limits. Appendix not well seen but no right lower quadrant inflammatory process. No evidence of bowel wall thickening, distention, or inflammatory changes. Large formed feces at the rectum. Vascular/Lymphatic: Mild aortic atherosclerosis. No aneurysm. No significantly enlarged lymph nodes. Reproductive: Slightly enlarged prostate. Other: No free air or free fluid.  Small fat in the umbilical region Musculoskeletal: Grade 1 anterolisthesis L4 on L5. Prominent degenerative changes of the lumbosacral spine. IMPRESSION: 1. Slightly dense appearing bladder wall which demonstrates marked wall thickening and surrounding inflammatory changes, suspicious  for an acute cystitis. Negative for hydronephrosis or ureteral stone. 2. Large amount of stool in the colon with large formed feces at the rectum suggesting constipation. Electronically Signed: By: Jasmine PangKim  Fujinaga M.D. On: 07/06/2019 23:01    EKG: Independently reviewed.  Normal sinus rhythm LVH.  Assessment/Plan Principal Problem:   Sepsis (HCC) Active Problems:   Essential hypertension   Acute lower UTI   ARF (acute renal failure) (HCC)    1. Sepsis secondary UTI -continue with ceftriaxone follow cultures per continue hydration. 2. Acute encephalopathy secondary to sepsis has improved.  Continue to  monitor.  Appears nonfocal. 3. Hematuria will need further work-up as outpatient and follow-up with urologist or primary care. 4. Thrombocytopenia no old labs to compare.  Follow CBC. 5. Acute renal failure -no old labs to compare.  Will hold lisinopril for now and gently hydrate and follow metabolic panel.  Patient also take NSAIDs which will be held. 6. Mildly elevated high-sensitivity troponin.  Likely from sepsis patient denies any chest pain.  Will trend cardiac markers. 7. Hypertension holding lisinopril due to acute renal failure.  As needed IV hydralazine. 8. History of anxiety and low back pain on baclofen and Klonopin.  If creatinine worsens may have to hold baclofen.  COVID-19 test is pending.   DVT prophylaxis: Lovenox. Code Status: Full code. Family Communication: We will need to contact patient's wife. Disposition Plan: Home. Consults called: None. Admission status: Observation.   Eduard ClosArshad N Shaqueena Mauceri MD [facility] Pager [phone].  If 7PM-7AM, please contact night-coverage [email or web address] Password [email or web address]  07/07/2019, 1:15 AM

## 2019-07-08 DIAGNOSIS — D696 Thrombocytopenia, unspecified: Secondary | ICD-10-CM | POA: Diagnosis present

## 2019-07-08 DIAGNOSIS — R778 Other specified abnormalities of plasma proteins: Secondary | ICD-10-CM | POA: Diagnosis present

## 2019-07-08 DIAGNOSIS — R319 Hematuria, unspecified: Secondary | ICD-10-CM | POA: Diagnosis present

## 2019-07-08 DIAGNOSIS — R7989 Other specified abnormal findings of blood chemistry: Secondary | ICD-10-CM | POA: Diagnosis present

## 2019-07-08 DIAGNOSIS — G9341 Metabolic encephalopathy: Secondary | ICD-10-CM | POA: Diagnosis present

## 2019-07-08 LAB — CBC
HCT: 35.2 % — ABNORMAL LOW (ref 39.0–52.0)
Hemoglobin: 11.9 g/dL — ABNORMAL LOW (ref 13.0–17.0)
MCH: 28.3 pg (ref 26.0–34.0)
MCHC: 33.8 g/dL (ref 30.0–36.0)
MCV: 83.6 fL (ref 80.0–100.0)
Platelets: 128 10*3/uL — ABNORMAL LOW (ref 150–400)
RBC: 4.21 MIL/uL — ABNORMAL LOW (ref 4.22–5.81)
RDW: 13 % (ref 11.5–15.5)
WBC: 7.5 10*3/uL (ref 4.0–10.5)
nRBC: 0 % (ref 0.0–0.2)

## 2019-07-08 LAB — TROPONIN I (HIGH SENSITIVITY)
Troponin I (High Sensitivity): 66 ng/L — ABNORMAL HIGH (ref <18)
Troponin I (High Sensitivity): 54 ng/L — ABNORMAL HIGH (ref <18)

## 2019-07-08 LAB — ECHOCARDIOGRAM COMPLETE
Height: 72 in
Weight: 2640 oz

## 2019-07-08 LAB — PATHOLOGIST SMEAR REVIEW

## 2019-07-08 LAB — LIPID PANEL
Cholesterol: 149 mg/dL (ref 0–200)
HDL: 47 mg/dL (ref >40)
LDL Cholesterol: 86 mg/dL (ref 0–99)
Total CHOL/HDL Ratio: 3.2 RATIO
Triglycerides: 80 mg/dL (ref <150)
VLDL: 16 mg/dL (ref 0–40)

## 2019-07-08 LAB — LACTATE DEHYDROGENASE: LDH: 172 U/L (ref 98–192)

## 2019-07-08 LAB — HEMOGLOBIN A1C
Hgb A1c MFr Bld: 5.6 % (ref 4.8–5.6)
Mean Plasma Glucose: 114.02 mg/dL

## 2019-07-08 MED ORDER — SODIUM CHLORIDE 0.9 % IV SOLN
INTRAVENOUS | Status: DC
  Administered 2019-07-08 – 2019-07-09 (×2): via INTRAVENOUS

## 2019-07-08 MED ORDER — AMLODIPINE BESYLATE 5 MG PO TABS
5.0000 mg | ORAL_TABLET | Freq: Every day | ORAL | Status: DC
  Administered 2019-07-08 – 2019-07-09 (×2): 5 mg via ORAL
  Filled 2019-07-08 (×2): qty 1

## 2019-07-08 MED ORDER — HYDRALAZINE HCL 25 MG PO TABS: 25.0000 mg | ORAL_TABLET | Freq: Three times a day (TID) | ORAL | Status: DC | PRN

## 2019-07-08 NOTE — Progress Notes (Signed)
Progress Note  Patient Name: Carl Figueroa Date of Encounter: 07/08/2019  Primary Cardiologist: [facility]  Subjective   Patient lying in bed with wife at bedside.  He denies any symptoms of chest pain or shortness of breath.  Inpatient Medications    Scheduled Meds:  aspirin EC  81 mg Oral Daily   atorvastatin  80 mg Oral q1800   baclofen  10 mg Oral QHS   clonazePAM  0.25 mg Oral TID   Continuous Infusions:  sodium chloride 75 mL/hr at 07/08/19 1314   cefTRIAXone (ROCEPHIN)  IV Stopped (07/07/19 2354)   PRN Meds: acetaminophen **OR** acetaminophen, hydrALAZINE, ondansetron **OR** ondansetron (ZOFRAN) IV   Vital Signs    Vitals:   07/08/19 0059 07/08/19 0730 07/08/19 1400 07/08/19 1436  BP: (!) 167/87 (!) 142/75 (!) 161/89   Pulse: 88 90 81   Resp:  (!) 25  20  Temp: 99.3 F (37.4 C) 99.1 F (37.3 C)  98.3 F (36.8 C)  TempSrc: Oral Oral  Oral  SpO2: 95% 99%    Weight:      Height:        Intake/Output Summary (Last 24 hours) at 07/08/2019 1458 Last data filed at 07/08/2019 1300 Gross per 24 hour  Intake 460 ml  Output 350 ml  Net 110 ml   Last 3 Weights 07/06/2019  Weight (lbs) 165 lb  Weight (kg) 74.844 kg      Telemetry    Patient is not on telemetry  ECG    None obtained-  Physical Exam   GEN: No acute distress.   Neck: No JVD Cardiac: RRR, 1/6 systolic murmur, rubs, or gallops.  Respiratory: Clear to auscultation bilaterally. GI: Soft, nontender, non-distended  MS: No edema; No deformity. Neuro:  Nonfocal  Psych: Normal affect   Labs    High Sensitivity Troponin:   Recent Labs  Lab 07/06/19 1957 07/07/19 0137 07/07/19 0325 07/08/19 1233  TROPONINIHS 40* 180* 180* 66*      Chemistry Recent Labs  Lab 07/06/19 1957 07/07/19 0133 07/07/19 0137 07/07/19 0507  NA 145  --   --  144  K 3.6  --   --  3.7  CL 108  --   --  114*  CO2 26  --   --  24  GLUCOSE 119*  --   --  103*  BUN 11  --   --  13  CREATININE 1.56*  1.60*  --  1.53*  CALCIUM 9.4  --   --  8.1*  PROT  --   --  5.3*  --   ALBUMIN  --   --  2.8*  --   AST  --   --  43*  --   ALT  --   --  13  --   ALKPHOS  --   --  57  --   BILITOT  --   --  0.9  --   GFRNONAA 43* 42*  --  44*  GFRAA 50* 49*  --  52*  ANIONGAP 11  --   --  6     Hematology Recent Labs  Lab 07/07/19 0133 07/07/19 0507 07/08/19 0513  WBC 16.1* 11.5* 7.5  RBC 3.96* 4.01* 4.21*  HGB 11.2* 11.3* 11.9*  HCT 34.2* 34.6* 35.2*  MCV 86.4 86.3 83.6  MCH 28.3 28.2 28.3  MCHC 32.7 32.7 33.8  RDW 12.9 12.9 13.0  PLT 131* 131* 128*    BNPNo results for input(s):  BNP, PROBNP in the last 168 hours.   DDimer No results for input(s): DDIMER in the last 168 hours.   Radiology    Dg Chest 2 View  Result Date: 07/06/2019 CLINICAL DATA:  Fever, right chest pain EXAM: CHEST - 2 VIEW COMPARISON:  None. FINDINGS: Slight elevation of the left hemidiaphragm with left base atelectasis. Right lung clear. Heart is normal size. No effusions. No acute bony abnormality. IMPRESSION: Minimal left base atelectasis. Electronically Signed   By: Rolm Baptise M.D.   On: 07/06/2019 21:03   Ct Renal Stone Study  Addendum Date: 07/06/2019   ADDENDUM REPORT: 07/06/2019 23:08 ADDENDUM: Advanced arthritis of the left hip with severe joint space narrowing and prominent subarticular cystic change Electronically Signed   By: Donavan Foil M.D.   On: 07/06/2019 23:08   Result Date: 07/06/2019 CLINICAL DATA:  Frequency hematuria flank pain EXAM: CT ABDOMEN AND PELVIS WITHOUT CONTRAST TECHNIQUE: Multidetector CT imaging of the abdomen and pelvis was performed following the standard protocol without IV contrast. COMPARISON:  None. FINDINGS: Lower chest: Lung bases demonstrate no acute consolidation or effusion. Elevation of left diaphragm with mild left pleural thickening. Heart size within normal limits. Hepatobiliary: No focal liver abnormality is seen. No gallstones, gallbladder wall thickening, or  biliary dilatation. Pancreas: Unremarkable. No pancreatic ductal dilatation or surrounding inflammatory changes. Spleen: Normal in size without focal abnormality. Adrenals/Urinary Tract: Adrenal glands are normal. No hydronephrosis. Negative for ureteral stone. Slightly dense appearing bladder wall with marked wall thickening and inflammatory changes. Stomach/Bowel: Stomach is within normal limits. Appendix not well seen but no right lower quadrant inflammatory process. No evidence of bowel wall thickening, distention, or inflammatory changes. Large formed feces at the rectum. Vascular/Lymphatic: Mild aortic atherosclerosis. No aneurysm. No significantly enlarged lymph nodes. Reproductive: Slightly enlarged prostate. Other: No free air or free fluid.  Small fat in the umbilical region Musculoskeletal: Grade 1 anterolisthesis L4 on L5. Prominent degenerative changes of the lumbosacral spine. IMPRESSION: 1. Slightly dense appearing bladder wall which demonstrates marked wall thickening and surrounding inflammatory changes, suspicious for an acute cystitis. Negative for hydronephrosis or ureteral stone. 2. Large amount of stool in the colon with large formed feces at the rectum suggesting constipation. Electronically Signed: By: Donavan Foil M.D. On: 07/06/2019 23:01    Cardiac Studies   TTE 07/07/2019  1. Left ventricular ejection fraction, by visual estimation, is 60 to 65%. The left ventricle has normal function. There is mildly increased left ventricular hypertrophy.  2. Elevated left atrial pressure.  3. Left ventricular diastolic parameters are consistent with Grade I diastolic dysfunction (impaired relaxation).  4. The left ventricle has no regional wall motion abnormalities.  5. Global right ventricle has low normal systolic function.The right ventricular size is normal. Right vetricular wall thickness was not assessed.  6. Left atrial size was normal.  7. Right atrial size was normal.  8. The  mitral valve is grossly normal. Trace mitral valve regurgitation. No evidence of mitral stenosis.  9. The tricuspid valve is not well visualized. Tricuspid valve regurgitation is not demonstrated. 10. The aortic valve was not well visualized. Aortic valve regurgitation is not visualized. Mild aortic valve sclerosis without stenosis. 11. The pulmonic valve was not well visualized. Pulmonic valve regurgitation is not visualized. 12. TR signal is inadequate for assessing pulmonary artery systolic pressure. 13. The interatrial septum was not well visualized.  Patient Profile     73 y.o. male with history of hypertension who presents due to generalized fatigue.  Found to be febrile with temperatures of 102.3 in the emergency room.  He was diagnosed with a UTI and started on antibiotics.  Troponins showed mild elevation deemed secondary to demand - supply mismatch and also renal dysfunction..  Assessment & Plan    Echocardiogram showed normal ejection fraction.  Patient does not have any symptoms consistent with ACS.  Patient is hypertensive with systolic blood pressure in the 160s.  Elevated troponins -Echocardiogram is normal -Outpatient stress test can be considered after patient has recovered from acute infection  htn -Start Norvasc 5 mg daily. -Avoiding home lisinopril due to renal dysfunction.      Signed, Debbe Odea, MD  07/08/2019, 2:58 PM

## 2019-07-08 NOTE — Progress Notes (Signed)
Patient monitor alarming that leads were off. Entered room to find patient standing at the foot of the stretcher. Patient reports he had to urinate. Patient urinated on the floor. Patient continues to be confused about situation and his location at times. Patient instructed to call when he needs to use the restroom. Patient assisted back into the bed. Left with siderails up and call light in reach.

## 2019-07-08 NOTE — Progress Notes (Addendum)
PROGRESS NOTE    Carl Figueroa  ZOX:[id number] DOB: Mar 15, 1946 DOA: 07/06/2019 PCP: [facility]    Brief Narrative:    Carl Figueroa is a 73 y.o. male with history of hypertension and chronic back pain presents to the ER after patient's wife found that patient was confused and incontinent of urine.  Does also report that patient had some hematuria.  Patient denies any shortness of breath chest pain nausea vomiting or diarrhea after he became more alert awake at the time of my exam.  ED Course: In the ER as per the ER patient patient was confused initially and febrile with temperature 102 F septic picture with UA showing hematuria and features concerning for UTI.  CT renal study shows cystitis.  Patient was given fluid bolus blood cultures obtained along with urine culture chest x-ray was unremarkable COVID-19 is pending.  Patient started on ceftriaxone for UTI with sepsis admitted for further observation rather time of my exam patient is completely alert awake.  Labs reveal a platelets of 149 creatinine 1.5 hemoglobin 13.  Interim History: -11/18: 2d echo showed:    1. Left ventricular ejection fraction, by visual estimation, is 60 to 65%. The left ventricle has normal function. There is mildly increased left ventricular hypertrophy.    2. Elevated left atrial pressure.   3. Left ventricular diastolic parameters are consistent with Grade I diastolic dysfunction (impaired relaxation).   4. The left ventricle has no regional wall motion abnormalities.   5. Global right ventricle has low normal systolic function.The right ventricular size is normal. Right vetricular wall thickness was not assessed.   6. Left atrial size was normal.   7. Right atrial size was normal.   8. The mitral valve is grossly normal. Trace mitral valve regurgitation. No evidence of mitral stenosis.   9. The tricuspid valve is not well visualized. Tricuspid valve regurgitation is not  demonstrated.  10. The aortic valve was not well visualized. Aortic valve regurgitation is not visualized. Mild aortic valve sclerosis without stenosis.  11. The pulmonic valve was not well visualized. Pulmonic valve regurgitation is not visualized.  12. TR signal is inadequate for assessing pulmonary artery systolic pressure.  13. The interatrial septum was not well visualized.  -11/18: trop 40 -->180 -->180 -11/18: Card was consulted: per Dr. Okey Dupre suspects the elevated trop from 40->180->180 represents supply-demand mismatch in the setting of sepsis.  I do not believe this is ACS. "If EF is preserved and there is no regional wall motion abnormality, I would favor outpatient myocardial perfusion stress test once the patient has recovered from his urinary tract infection". - 11/17: GPC 1/4 bottles (aerobic), Per microbiology results called into pharmacy earlier today. WBC 11.5 (down from this morning) and afebrile in the past 24 hours.   Subjective: Patient does not have fever or chills.  Denies chest pain, shortness breath, cough.  No nausea, vomiting, diarrhea or abdominal pain.  No dysuria or burning on urination.   Assessment & Plan:   Principal Problem:   Sepsis (HCC) Active Problems:   Essential hypertension   Acute lower UTI   ARF (acute renal failure) (HCC)   Acute metabolic encephalopathy   Hematuria   Thrombocytopenia (HCC)   Elevated troponin   Sepsis due to UTI: Leukocytosis trending down, WBC 11.5 -->7.5.  Temperature 99.3.  Hemodynamically stable.  Sepsis seem to have resolved. Lactic acid 1.6 on admission. Urine culture was not done, I ordered Urine culture. Blood culture showed positive GPC  1/4 bottles (aerobic). Maybe be a contaminant.  -will continue IV rocephin -f/u blood and curine culture  Acute metabolic encephalopathy: resolved. Pt is alert oriented x3.  No focal neurological findings. Likely due to UTI. -monitor closely  Elevated troponin: trop 40 -->180  -->180.  Patient denies chest pain. 2d echo showed LV, by visual estimation, is 60 to 65%. The left ventricle has normal function.  - highly appreciate cardiology, Dr. Serita Kyle consultation - trend trop -Atorvastatin 80 mg daily while awaiting lipid panel. -ASA -pending A1c and LDL 86 -f/u with Card for outpatient myocardial perfusion stress test once the patient has recovered from his urinary tract infection per Dr. Okey Dupre  Hematuria: may be due UTI, but need to f/u closely -need to f/u with PCP or PCP giver referral to urology  Thrombocytopenia: platelet 131 --> 128.  -will get LDH -will check peripheral smear. - Follow CBC.   AKI: Cre  1.53 and BUN 103. May be due to UTI. -Will hold lisinopril for now - NS 75 cc/h -Patient also take NSAIDs (naproxen), which will be held.  Lab Results  Component Value Date   CREATININE 1.53 (H) 07/07/2019   CREATININE 1.60 (H) 07/07/2019   CREATININE 1.56 (H) 07/06/2019    HTN: -hold lisinopril -As needed hydralazine   DVT prophylaxis: SCD Code Status: full Family Communication: none, not at bed side Disposition Plan: likely d/c tomorrow. Pt's AKI has not improved significantly, will still need to continue gentle IVF. Blood culture showed positive GPC 1/4 bottles (aerobic). Maybe be a contaminant, need to see more clear report.   Consultants:   Card  Procedures:    Antimicrobials: Anti-infectives (From admission, onward)   Start     Dose/Rate Route Frequency Ordered Stop   07/07/19 2300  cefTRIAXone (ROCEPHIN) 2 g in sodium chloride 0.9 % 100 mL IVPB     2 g 200 mL/hr over 30 Minutes Intravenous Every 24 hours 07/07/19 0108     07/06/19 2300  cefTRIAXone (ROCEPHIN) 1 g in sodium chloride 0.9 % 100 mL IVPB     1 g 200 mL/hr over 30 Minutes Intravenous  Once 07/06/19 2255 07/07/19 0036           Objective: Vitals:   07/07/19 1902 07/07/19 2110 07/08/19 0059 07/08/19 0730  BP: (!) 167/80 (!) 168/87 (!) 167/87 (!) 142/75   Pulse: 71 88 88 90  Resp: (!) 24 20  (!) 25  Temp:  98.8 F (37.1 C) 99.3 F (37.4 C) 99.1 F (37.3 C)  TempSrc:  Oral Oral Oral  SpO2: 97% 99% 95% 99%  Weight:      Height:        Intake/Output Summary (Last 24 hours) at 07/08/2019 1307 Last data filed at 07/08/2019 0900 Gross per 24 hour  Intake 1463.26 ml  Output 350 ml  Net 1113.26 ml   Filed Weights   07/06/19 2201  Weight: 74.8 kg    Examination:  Physical Exam:  General: Not in acute distress HEENT: PERRL, EOMI, no scleral icterus, No JVD or bruit Cardiac: S1/S2, RRR, has 2/6 systolic murmurs, gallops or rubs Pulm: Clear to auscultation bilaterally. No rales, wheezing, rhonchi or rubs. Abd: Soft, nondistended, nontender, no rebound pain, no organomegaly, BS present Ext: No edema. 2+DP/PT pulse bilaterally Musculoskeletal: No joint deformities, erythema, or stiffness, ROM full Skin: No rashes.  Neuro: Alert and oriented X3, cranial nerves II-XII grossly intact, moves all extremeties normally Psych: Patient is not psychotic, no suicidal or hemocidal ideation.  Data Reviewed: I have personally reviewed following labs and imaging studies  CBC: Recent Labs  Lab 07/06/19 1957 07/07/19 0133 07/07/19 0507 07/08/19 0513  WBC 9.4 16.1* 11.5* 7.5  HGB 13.0 11.2* 11.3* 11.9*  HCT 39.2 34.2* 34.6* 35.2*  MCV 85.4 86.4 86.3 83.6  PLT 149* 131* 131* 128*   Basic Metabolic Panel: Recent Labs  Lab 07/06/19 1957 07/07/19 0133 07/07/19 0507  NA 145  --  144  K 3.6  --  3.7  CL 108  --  114*  CO2 26  --  24  GLUCOSE 119*  --  103*  BUN 11  --  13  CREATININE 1.56* 1.60* 1.53*  CALCIUM 9.4  --  8.1*   GFR: Estimated Creatinine Clearance: 45.5 mL/min (A) (by C-G formula based on SCr of 1.53 mg/dL (H)). Liver Function Tests: Recent Labs  Lab 07/07/19 0137  AST 43*  ALT 13  ALKPHOS 57  BILITOT 0.9  PROT 5.3*  ALBUMIN 2.8*   No results for input(s): LIPASE, AMYLASE in the last 168 hours. No  results for input(s): AMMONIA in the last 168 hours. Coagulation Profile: No results for input(s): INR, PROTIME in the last 168 hours. Cardiac Enzymes: No results for input(s): CKTOTAL, CKMB, CKMBINDEX, TROPONINI in the last 168 hours. BNP (last 3 results) No results for input(s): PROBNP in the last 8760 hours. HbA1C: No results for input(s): HGBA1C in the last 72 hours. CBG: No results for input(s): GLUCAP in the last 168 hours. Lipid Profile: Recent Labs    07/08/19 0513  CHOL 149  HDL 47  LDLCALC 86  TRIG 80  CHOLHDL 3.2   Thyroid Function Tests: No results for input(s): TSH, T4TOTAL, FREET4, T3FREE, THYROIDAB in the last 72 hours. Anemia Panel: No results for input(s): VITAMINB12, FOLATE, FERRITIN, TIBC, IRON, RETICCTPCT in the last 72 hours. Sepsis Labs: Recent Labs  Lab 07/06/19 1957  LATICACIDVEN 1.6    Recent Results (from the past 240 hour(s))  Blood culture (routine x 2)     Status: None (Preliminary result)   Collection Time: 07/06/19  7:57 PM   Specimen: BLOOD  Result Value Ref Range Status   Specimen Description BLOOD RIGHT ANTECUBITAL  Final   Special Requests   Final    BOTTLES DRAWN AEROBIC AND ANAEROBIC Blood Culture results may not be optimal due to an excessive volume of blood received in culture bottles   Culture   Final    NO GROWTH 2 DAYS Performed at [facility], [address] [place], [place] [address]    Report Status PENDING  Incomplete  Blood culture (routine x 2)     Status: None (Preliminary result)   Collection Time: 07/06/19  7:57 PM   Specimen: BLOOD  Result Value Ref Range Status   Specimen Description   Final    BLOOD LEFT ANTECUBITAL Performed at [facility], [address] [place], [place] [address]    Special Requests   Final    BOTTLES DRAWN AEROBIC AND ANAEROBIC Blood Culture results may not be optimal due to an excessive volume of blood received in culture bottles Performed at [facility], [address], [place], [place] [address]    Culture  Setup Time   Final    GRAM POSITIVE COCCI AEROBIC BOTTLE ONLY CRITICAL RESULT CALLED TO, READ BACK BY AND VERIFIED WITH: LISA KLUTZ  ON 07/07/2019 BY FMW Performed at [facility], [address] [place], [place] [address]  Culture   Final    GRAM POSITIVE COCCI CULTURE REINCUBATED FOR BETTER GROWTH Performed at [facility], [place] [address] [place], [place] [address]    Report Status PENDING  Incomplete  SARS CORONAVIRUS 2 (TAT 6-24 HRS) Nasopharyngeal Nasopharyngeal Swab     Status: None   Collection Time: 07/06/19 11:44 PM   Specimen: Nasopharyngeal Swab  Result Value Ref Range Status   SARS Coronavirus 2 NEGATIVE NEGATIVE Final    Comment: (NOTE) SARS-CoV-2 target nucleic acids are NOT DETECTED. The SARS-CoV-2 RNA is generally detectable in upper and lower respiratory specimens during the acute phase of infection. Negative results do not preclude SARS-CoV-2 infection, do not rule out co-infections with other pathogens, and should not be used as the sole basis for treatment or other patient management decisions. Negative results must be combined with clinical observations, patient history, and epidemiological information. The expected result is Negative. Fact Sheet for Patients: SugarRoll.be Fact Sheet for Healthcare Providers: https://www.woods-mathews.com/ This test is not yet approved or cleared by the [place] FDA and  has been authorized for detection and/or diagnosis of SARS-CoV-2 by FDA under an Emergency Use Authorization (EUA). This EUA will remain  in effect (meaning this test can be used) for the duration of the COVID-19 declaration under Section 56 4(b)(1) of the Act, 21 U.S.C. section 360bbb-3(b)(1), unless the authorization is terminated or revoked sooner. Performed at [facility], [place] [address], [place],  [place] [address]      Radiology Studies: Dg Chest 2 View  Result Date: 07/06/2019 CLINICAL DATA:  Fever, right chest pain EXAM: CHEST - 2 VIEW COMPARISON:  None. FINDINGS: Slight elevation of the left hemidiaphragm with left base atelectasis. Right lung clear. Heart is normal size. No effusions. No acute bony abnormality. IMPRESSION: Minimal left base atelectasis. Electronically Signed   By: Rolm Baptise M.D.   On: 07/06/2019 21:03   Ct Renal Stone Study  Addendum Date: 07/06/2019   ADDENDUM REPORT: 07/06/2019 23:08 ADDENDUM: Advanced arthritis of the left hip with severe joint space narrowing and prominent subarticular cystic change Electronically Signed   By: Donavan Foil M.D.   On: 07/06/2019 23:08   Result Date: 07/06/2019 CLINICAL DATA:  Frequency hematuria flank pain EXAM: CT ABDOMEN AND PELVIS WITHOUT CONTRAST TECHNIQUE: Multidetector CT imaging of the abdomen and pelvis was performed following the standard protocol without IV contrast. COMPARISON:  None. FINDINGS: Lower chest: Lung bases demonstrate no acute consolidation or effusion. Elevation of left diaphragm with mild left pleural thickening. Heart size within normal limits. Hepatobiliary: No focal liver abnormality is seen. No gallstones, gallbladder wall thickening, or biliary dilatation. Pancreas: Unremarkable. No pancreatic ductal dilatation or surrounding inflammatory changes. Spleen: Normal in size without focal abnormality. Adrenals/Urinary Tract: Adrenal glands are normal. No hydronephrosis. Negative for ureteral stone. Slightly dense appearing bladder wall with marked wall thickening and inflammatory changes. Stomach/Bowel: Stomach is within normal limits. Appendix not well seen but no right lower quadrant inflammatory process. No evidence of bowel wall thickening, distention, or inflammatory changes. Large formed feces at the rectum. Vascular/Lymphatic: Mild aortic atherosclerosis. No aneurysm. No significantly enlarged lymph nodes.  Reproductive: Slightly enlarged prostate. Other: No free air or free fluid.  Small fat in the umbilical region Musculoskeletal: Grade 1 anterolisthesis L4 on L5. Prominent degenerative changes of the lumbosacral spine. IMPRESSION: 1. Slightly dense appearing bladder wall which demonstrates marked wall thickening and surrounding inflammatory changes, suspicious for an acute cystitis. Negative for hydronephrosis or ureteral stone. 2. Large  amount of stool in the colon with large formed feces at the rectum suggesting constipation. Electronically Signed: By: Jasmine PangKim  Fujinaga M.D. On: 07/06/2019 23:01        Scheduled Meds:  aspirin EC  81 mg Oral Daily   atorvastatin  80 mg Oral q1800   baclofen  10 mg Oral QHS   clonazePAM  0.25 mg Oral TID   Continuous Infusions:  sodium chloride     cefTRIAXone (ROCEPHIN)  IV Stopped (07/07/19 2354)     LOS: 1 day    Time spent: 30 min    Lorretta HarpXilin Ronald Londo, DO [facility] PAGER is on AMION  If 7PM-7AM, please contact night-coverage [email or web address] Password [email or web address] 07/08/2019, 1:07 PM

## 2019-07-08 NOTE — Progress Notes (Signed)
Report called to Magdalen Spatz on 1A

## 2019-07-08 NOTE — Evaluation (Signed)
Physical Therapy Evaluation Patient Details Name: Carl Figueroa MRN: [id number] DOB: [birth year] Today's Date: 07/08/2019   History of Present Illness  Pt admitted for sepsis with complaints of confusion and incontinent. History includes HTN.   Clinical Impression  Pt is a pleasant 73 year old male who was admitted for sepsis. Pt performs bed mobility, transfers, and ambulation with min assist and RW. Pt is very confused and thinks he's at home. Per wife, this is improved compared to previous date. Pt demonstrates deficits with strength/mobility/cognition. Pt is grossly close to baseline. Would benefit from skilled PT to address above deficits and promote optimal return to PLOF. Recommend transition to [facility] upon discharge from acute hospitalization.     Follow Up Recommendations Home health PT;Supervision/Assistance - 24 hour    Equipment Recommendations  Rolling walker with 5" wheels    Recommendations for Other Services       Precautions / Restrictions Precautions Precautions: Fall Restrictions Weight Bearing Restrictions: No      Mobility  Bed Mobility Overal bed mobility: Needs Assistance Bed Mobility: Supine to Sit     Supine to sit: Min assist     General bed mobility comments: needs assist for sequencing and once seated at EOB, able to decrease assist to supervision.   Transfers Overall transfer level: Needs assistance Equipment used: Rolling walker (2 wheeled) Transfers: Sit to/from Stand Sit to Stand: Min assist         General transfer comment: needs cues to push from bed. Once standing, pt able to stand with upright posture.  Ambulation/Gait Ambulation/Gait assistance: Min assist Gait Distance (Feet): 110 Feet Assistive device: Rolling walker (2 wheeled) Gait Pattern/deviations: Step-to pattern     General Gait Details: ambulated back and forth in room and then in hallway. Pt follows commands well but does fatigue with increased distance. Needs heavy  cues for sequencing  Stairs            Wheelchair Mobility    Modified Rankin (Stroke Patients Only)       Balance Overall balance assessment: Needs assistance Sitting-balance support: Feet supported;No upper extremity supported Sitting balance-Leahy Scale: Good     Standing balance support: Bilateral upper extremity supported Standing balance-Leahy Scale: Fair                               Pertinent Vitals/Pain Pain Assessment: No/denies pain    Home Living Family/patient expects to be discharged to:: Private residence Living Arrangements: Spouse/significant other Available Help at Discharge: Family Type of Home: House Home Access: Stairs to enter Entrance Stairs-Rails: None Entrance Stairs-Number of Steps: 2-3 Home Layout: One level Home Equipment: Cane - single point;Electric scooter      Prior Function Level of Independence: Independent with assistive device(s)         Comments: was indep using SPC prior to admission and used electric scooter for community distances     Hand Dominance        Extremity/Trunk Assessment   Upper Extremity Assessment Upper Extremity Assessment: Generalized weakness(B UE grossly 4/5)    Lower Extremity Assessment Lower Extremity Assessment: Generalized weakness(L LE grossly 3+/5; R LE grossly 4/5)       Communication   Communication: No difficulties  Cognition Arousal/Alertness: Awake/alert Behavior During Therapy: WFL for tasks assessed/performed Overall Cognitive Status: History of cognitive impairments - at baseline  General Comments      Exercises Other Exercises Other Exercises: Seated/standing ther-ex performed including alt. marching x 10 reps with cga. Other Exercises: Pt ambulated to bathroom. Needed min assist for transfer on/off toliet. Once done, pt found standing up with RW. Needs cues for safety.   Assessment/Plan    PT  Assessment Patient needs continued PT services  PT Problem List Decreased strength;Decreased balance;Decreased mobility;Decreased activity tolerance;Decreased cognition       PT Treatment Interventions Gait training;Therapeutic exercise;DME instruction    PT Goals (Current goals can be found in the Care Plan section)  Acute Rehab PT Goals Patient Stated Goal: to get stronger PT Goal Formulation: With patient Time For Goal Achievement: 07/22/19 Potential to Achieve Goals: Good    Frequency Min 2X/week   Barriers to discharge        Co-evaluation               AM-PAC PT "6 Clicks" Mobility  Outcome Measure Help needed turning from your back to your side while in a flat bed without using bedrails?: A Little Help needed moving from lying on your back to sitting on the side of a flat bed without using bedrails?: A Little Help needed moving to and from a bed to a chair (including a wheelchair)?: A Little Help needed standing up from a chair using your arms (e.g., wheelchair or bedside chair)?: A Little Help needed to walk in hospital room?: A Little Help needed climbing 3-5 steps with a railing? : A Lot 6 Click Score: 17    End of Session Equipment Utilized During Treatment: Gait belt Activity Tolerance: Patient tolerated treatment well Patient left: in bed(seated on bed with RN) Nurse Communication: Mobility status PT Visit Diagnosis: Muscle weakness (generalized) (M62.81)    Time: 7741-2878 PT Time Calculation (min) (ACUTE ONLY): 35 min   Charges:   PT Evaluation $PT Eval Low Complexity: 1 Low PT Treatments $Gait Training: 8-22 mins $Therapeutic Activity: 8-22 mins        Elizabeth Palau, PT, DPT [phone]   Foye Haggart 07/08/2019, 4:47 PM

## 2019-07-09 DIAGNOSIS — N390 Urinary tract infection, site not specified: Secondary | ICD-10-CM

## 2019-07-09 DIAGNOSIS — I248 Other forms of acute ischemic heart disease: Secondary | ICD-10-CM

## 2019-07-09 DIAGNOSIS — G9341 Metabolic encephalopathy: Secondary | ICD-10-CM

## 2019-07-09 DIAGNOSIS — R319 Hematuria, unspecified: Secondary | ICD-10-CM

## 2019-07-09 LAB — BASIC METABOLIC PANEL
Anion gap: 9 (ref 5–15)
BUN: 10 mg/dL (ref 8–23)
CO2: 27 mmol/L (ref 22–32)
Calcium: 9 mg/dL (ref 8.9–10.3)
Chloride: 109 mmol/L (ref 98–111)
Creatinine, Ser: 1.22 mg/dL (ref 0.61–1.24)
GFR calc Af Amer: >60 mL/min (ref >60)
GFR calc non Af Amer: 58 mL/min — ABNORMAL LOW (ref >60)
Glucose, Bld: 93 mg/dL (ref 70–99)
Potassium: 3.7 mmol/L (ref 3.5–5.1)
Sodium: 145 mmol/L (ref 135–145)

## 2019-07-09 LAB — URINE CULTURE: Culture: NO GROWTH

## 2019-07-09 LAB — CBC
HCT: 34 % — ABNORMAL LOW (ref 39.0–52.0)
Hemoglobin: 11.4 g/dL — ABNORMAL LOW (ref 13.0–17.0)
MCH: 28 pg (ref 26.0–34.0)
MCHC: 33.5 g/dL (ref 30.0–36.0)
MCV: 83.5 fL (ref 80.0–100.0)
Platelets: 138 10*3/uL — ABNORMAL LOW (ref 150–400)
RBC: 4.07 MIL/uL — ABNORMAL LOW (ref 4.22–5.81)
RDW: 12.9 % (ref 11.5–15.5)
WBC: 4.5 10*3/uL (ref 4.0–10.5)
nRBC: 0 % (ref 0.0–0.2)

## 2019-07-09 LAB — CULTURE, BLOOD (ROUTINE X 2)

## 2019-07-09 LAB — GLUCOSE, CAPILLARY: Glucose-Capillary: 90 mg/dL (ref 70–99)

## 2019-07-09 MED ORDER — AMLODIPINE BESYLATE 5 MG PO TABS: 5.0000 mg | ORAL_TABLET | Freq: Every day | ORAL | 1 refills | Status: DC

## 2019-07-09 MED ORDER — CEPHALEXIN 500 MG PO CAPS: 500.0000 mg | ORAL_CAPSULE | Freq: Three times a day (TID) | ORAL | 0 refills | Status: AC

## 2019-07-09 MED ORDER — ATORVASTATIN CALCIUM 40 MG PO TABS: 40.0000 mg | ORAL_TABLET | Freq: Every day | ORAL | 1 refills | Status: DC

## 2019-07-09 MED ORDER — ASPIRIN 81 MG PO TBEC: 81.0000 mg | DELAYED_RELEASE_TABLET | Freq: Every day | ORAL | 1 refills | Status: DC

## 2019-07-09 NOTE — Discharge Summary (Signed)
Physician Discharge Summary  Carl Figueroa ZOX:[id number] DOB: 12/08/1945 DOA: 07/06/2019  [facility]: [facility]  Admit date: 07/06/2019 Discharge date: 07/09/2019  Recommendations for Outpatient Follow-up:  1. Follow up with PCP in 1 weeks and cardiologist in 2 weeks 2. Please obtain BMP/CBC in one week 3. Please follow up on the following pending results: Blood and urine culture 4. Pt needs outpatient palliative consult  Home Health: HH PT/OT, RW, AID, RN,  Equipment/Devices: BSC and rolling walker  Discharge Condition: stable CODE STATUS: full Diet recommendation: heart health diet  Brief/Interim Summary (HPI)  Carl Figueroa is a 73 y.o. male with history of hypertension and chronic back pain presents to the ER after patient's wife found that patient was confused and incontinent of urine.  Does also report that patient had some hematuria.  Patient denies any shortness of breath chest pain nausea vomiting or diarrhea after he became more alert awake at the time of my exam.  ED Course: In the ER as per the ER patient patient was confused initially and febrile with temperature 102 F septic picture with UA showing hematuria and features concerning for UTI.  CT renal study shows cystitis.  Patient was given fluid bolus blood cultures obtained along with urine culture chest x-ray was unremarkable COVID-19 is pending.  Patient started on ceftriaxone for UTI with sepsis admitted for further observation rather time of my exam patient is completely alert awake.  Labs reveal a platelets of 149 creatinine 1.5 hemoglobin 13.  Subjective  Patient does not have chest pain, shortness of breath, cough.  No nausea, vomiting, diarrhea or abdominal pain.  No fever or chills.  Discharge Diagnoses and Hospital Course:   Principal Problem:   Sepsis (HCC) Active Problems:   Essential hypertension   Acute lower UTI   ARF (acute renal failure) (HCC)   Acute metabolic encephalopathy   Hematuria    Thrombocytopenia (HCC)   Elevated troponin  Sepsis due to UTI: Leukocytosis trending down, WBC 11.5 -->7.5.  Temperature 99.  Hemodynamically stable.  Sepsis seem to have resolved. Lactic acid 1.6 on admission. Urine culture was not done, I ordered Urine culture, no growth so far. Blood culture showed positive GPC 1/4 bottles (aerobic), likely due to contaminant. pt was treated with IV IV rocephin -will switch to oral Keflex at discharge for another 3 days (total of 5 days) -f/u blood and curine culture  Acute metabolic encephalopathy: resolved. Pt is alert oriented x3.  No focal neurological findings. Likely due to UTI. -monitor closely  Elevated troponin: trop 40 -->180 -->180.  Patient denies chest pain. 2d echo showed LV, by visual estimation, is 60 to 65%. The left ventricle has normal function. A1c 5.6 and LDL 86 -Atorvastatin  -ASA -f/u with Card for outpatient myocardial perfusion stress test once the patient has recovered from his urinary tract infection per Dr. Okey Dupre  Hematuria: may be due UTI, but need to f/u closely -need to f/u with PCP. Please ask PCP to give referral to urology  Thrombocytopenia: platelet 131 --> 128 -->138. Blood smear has no evidence of circulating blasts or schistocytes.  -Follow CBC.   AKI:  -Will hold lisinopril for now -Patient also take NSAIDs (naproxen), which will be held.  HTN:  -hold lisinopril due to AKI -start amlodipine 5 mg daily   Discharge Instructions  Discharge Instructions    Call MD for:  difficulty breathing, headache or visual disturbances   Complete by: As directed    Call MD for:  severe  uncontrolled pain   Complete by: As directed    Call MD for:  temperature >100.4   Complete by: As directed    Increase activity slowly   Complete by: As directed      Allergies as of 07/09/2019      Reactions   Morphine Nausea Only      Medication List    STOP taking these medications   lisinopril 40 MG tablet Commonly  known as: ZESTRIL   naproxen 500 MG tablet Commonly known as: NAPROSYN     TAKE these medications   amLODipine 5 MG tablet Commonly known as: NORVASC Take 1 tablet (5 mg total) by mouth daily. Start taking on: July 10, 2019   aspirin 81 MG EC tablet Take 1 tablet (81 mg total) by mouth daily. Start taking on: July 10, 2019   atorvastatin 40 MG tablet Commonly known as: Lipitor Take 1 tablet (40 mg total) by mouth daily.   baclofen 10 MG tablet Commonly known as: LIORESAL Take 10 mg by mouth at bedtime.   cephALEXin 500 MG capsule Commonly known as: KEFLEX Take 1 capsule (500 mg total) by mouth 3 (three) times daily for 3 days.   clonazePAM 0.5 MG tablet Commonly known as: KLONOPIN Take 0.25 mg by mouth 3 (three) times daily.            Durable Medical Equipment  (From admission, onward)         Start     Ordered   07/09/19 1313  DME 3-in-1  Once     07/09/19 1312   07/09/19 1310  For home use only DME Walker rolling  (Walkers)  Once    Comments: Principal Problem:   Sepsis (HCC) Active Problems:   Essential hypertension   Acute lower UTI   ARF (acute renal failure) (HCC)   Acute metabolic encephalopathy   Hematuria   Thrombocytopenia (HCC)   Elevated troponin  Question:  Patient needs a walker to treat with the following condition  Answer:  Heart abnormality   07/09/19 1312         Follow-up Information    End, Cristal Deerhristopher, MD Follow up in 2 week(s).   Specialty: Cardiology Contact information: [address] [place] [address] [phone]        [facility] Follow up in 1 week(s).   Specialty: General Practice Contact information: [address] [place] [id number] [phone]          Allergies  Allergen Reactions  . Morphine Nausea Only    Consultations:  cardiology   Procedures/Studies: Dg Chest 2 View  Result Date: 07/06/2019 CLINICAL DATA:  Fever, right chest pain EXAM:  CHEST - 2 VIEW COMPARISON:  None. FINDINGS: Slight elevation of the left hemidiaphragm with left base atelectasis. Right lung clear. Heart is normal size. No effusions. No acute bony abnormality. IMPRESSION: Minimal left base atelectasis. Electronically Signed   By: Charlett NoseKevin  Dover M.D.   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   On: 07/06/2019 23:08   Result Date: 07/06/2019 CLINICAL DATA:  Frequency hematuria flank pain EXAM: CT ABDOMEN AND PELVIS WITHOUT CONTRAST TECHNIQUE: Multidetector CT imaging of the abdomen and pelvis was performed following the standard protocol without IV contrast. COMPARISON:  None.  FINDINGS: Lower chest: Lung bases demonstrate no acute consolidation or effusion. Elevation of left diaphragm with mild left pleural thickening. Heart size within normal limits. Hepatobiliary: No focal liver abnormality is seen. No gallstones, gallbladder wall thickening, or biliary dilatation. Pancreas: Unremarkable. No pancreatic ductal dilatation or surrounding inflammatory changes. Spleen: Normal in size without focal abnormality. Adrenals/Urinary Tract: Adrenal glands are normal. No hydronephrosis. Negative for ureteral stone. Slightly dense appearing bladder wall with marked wall thickening and inflammatory changes. Stomach/Bowel: Stomach is within normal limits. Appendix not well seen but no right lower quadrant inflammatory process. No evidence of bowel wall thickening, distention, or inflammatory changes. Large formed feces at the rectum. Vascular/Lymphatic: Mild aortic atherosclerosis. No aneurysm. No significantly enlarged lymph nodes. Reproductive: Slightly enlarged prostate. Other: No free air or free fluid.  Small fat in the umbilical region Musculoskeletal:  Grade 1 anterolisthesis L4 on L5. Prominent degenerative changes of the lumbosacral spine. IMPRESSION: 1. Slightly dense appearing bladder wall which demonstrates marked wall thickening and surrounding inflammatory changes, suspicious for an acute cystitis. Negative for hydronephrosis or ureteral stone. 2. Large amount of stool in the colon with large formed feces at the rectum suggesting constipation. Electronically Signed: By: Jasmine Pang M.D. On: 07/06/2019 23:01      Discharge Exam: Vitals:   07/09/19 0419 07/09/19 0858  BP: 112/62 (!) 141/72  Pulse: 76 65  Resp: 17 17  Temp: 98.5 F (36.9 C) 98.3 F (36.8 C)  SpO2: 99% 100%   Vitals:   07/08/19 1953 07/09/19 0419 07/09/19 0627 07/09/19 0858  BP: (!) 185/80 112/62  (!) 141/72  Pulse: 73 76  65  Resp: Temp: 98.3 F (36.8 C) 98.5 F (36.9 C)  98.3 F (36.8 C)  TempSrc:    Oral  SpO2: 100% 99%  100%  Weight:   91.2 kg   Height:        General: Pt is alert, awake, not in acute distress Cardiovascular: RRR, S1/S2 +, no rubs, no gallops Respiratory: CTA bilaterally, no wheezing, no rhonchi Abdominal: Soft, NT, ND, bowel sounds + Extremities: no edema, no cyanosis    The results of significant diagnostics from this hospitalization (including imaging, microbiology, ancillary and laboratory) are listed below for reference.     Microbiology: Recent Results (from the past 240 hour(s))  Blood culture (routine x 2)     Status: None (Preliminary result)   Collection Time: 07/06/19  7:57 PM   Specimen: BLOOD  Result Value Ref Range Status   Specimen Description BLOOD RIGHT ANTECUBITAL  Final   Special Requests   Final    BOTTLES DRAWN AEROBIC AND ANAEROBIC Blood Culture results may not be optimal due to an excessive volume of blood received in culture bottles   Culture   Final    NO GROWTH 3 DAYS Performed at [facility], [address], [place], [place] [address]    Report Status PENDING   Incomplete  Blood culture (routine x 2)     Status: Abnormal   Collection Time: 07/06/19  7:57 PM   Specimen: BLOOD  Result Value Ref Range Status   Specimen Description   Final    BLOOD LEFT ANTECUBITAL Performed at [facility], [address], [place], [place] [address]    Special Requests   Final    BOTTLES DRAWN AEROBIC AND ANAEROBIC Blood Culture results may not be optimal due to an excessive volume of blood received in culture bottles Performed at [facility], [address] [place], [place] [address]    Culture  Setup Time   Final    GRAM POSITIVE COCCI AEROBIC BOTTLE ONLY CRITICAL RESULT CALLED TO, READ BACK BY AND VERIFIED WITH: LISA KLUTZ @1553  ON 07/07/2019 BY FMW Performed at [facility], [address] [place], [place] [address]    Culture (A)  Final    STAPHYLOCOCCUS SPECIES (COAGULASE NEGATIVE) THE SIGNIFICANCE OF ISOLATING THIS ORGANISM FROM A SINGLE SET OF BLOOD CULTURES WHEN MULTIPLE SETS ARE DRAWN IS UNCERTAIN. PLEASE NOTIFY THE MICROBIOLOGY DEPARTMENT WITHIN ONE WEEK IF SPECIATION AND SENSITIVITIES ARE REQUIRED. Performed at [facility], [place] [address] [place], [place] [address]    Report Status 07/09/2019 FINAL  Final  SARS CORONAVIRUS 2 (TAT 6-24 HRS) Nasopharyngeal Nasopharyngeal Swab     Status: None   Collection Time: 07/06/19 11:44 PM   Specimen: Nasopharyngeal Swab  Result Value Ref Range Status   SARS Coronavirus 2 NEGATIVE NEGATIVE Final    Comment: (NOTE) SARS-CoV-2 target nucleic acids are NOT DETECTED. The SARS-CoV-2 RNA is generally detectable in upper and lower respiratory specimens during the acute phase of infection. Negative results do not preclude SARS-CoV-2 infection, do not rule out co-infections with other pathogens, and should not be used as the sole basis for treatment or other patient management decisions. Negative results must be combined with clinical observations, patient history, and  epidemiological information. The expected result is Negative. Fact Sheet for Patients: SugarRoll.be Fact Sheet for Healthcare Providers: https://www.woods-mathews.com/ This test is not yet approved or cleared by the [place] FDA and  has been authorized for detection and/or diagnosis of SARS-CoV-2 by FDA under an Emergency Use Authorization (EUA). This EUA will remain  in effect (meaning this test can be used) for the duration of the COVID-19 declaration under Section 56 4(b)(1) of the Act, 21 U.S.C. section 360bbb-3(b)(1), unless the authorization is terminated or revoked sooner. Performed at [facility], [place] [address], [place], [place] [address]   Today - UCx     Status: None   Collection Time: 07/08/19  8:20 AM   Specimen: Urine, Random  Result Value Ref Range Status   Specimen Description   Final    URINE, RANDOM Performed at [facility], [address] [place], [place] [address]    Special Requests   Final    NONE Performed at [facility], [address] [place], [place] [address]    Culture   Final    NO GROWTH Performed at [facility], [place] [address], [place],  [address]    Report Status 07/09/2019 FINAL  Final     Labs: BNP (last 3 results) No results for input(s): BNP in the last 8760 hours. Basic Metabolic Panel: Recent Labs  Lab 07/06/19 1957 07/07/19 0133 07/07/19 0507 07/09/19 0711  NA 145  --  144 145  K 3.6  --  3.7 3.7  CL 108  --  114* 109  CO2 26  --  24 27  GLUCOSE 119*  --  103* 93  BUN 11  --  13 10  CREATININE 1.56* 1.60* 1.53* 1.22  CALCIUM 9.4  --  8.1* 9.0   Liver Function Tests: Recent Labs  Lab 07/07/19 0137  AST 43*  ALT 13  ALKPHOS 57  BILITOT 0.9  PROT 5.3*  ALBUMIN 2.8*   No results for input(s): LIPASE, AMYLASE in the last 168 hours. No results for input(s): AMMONIA in the  last 168 hours. CBC: Recent Labs  Lab 07/06/19 1957  07/07/19 0133 07/07/19 0507 07/08/19 0513 07/09/19 0711  WBC 9.4 16.1* 11.5* 7.5 4.5  HGB 13.0 11.2* 11.3* 11.9* 11.4*  HCT 39.2 34.2* 34.6* 35.2* 34.0*  MCV 85.4 86.4 86.3 83.6 83.5  PLT 149* 131* 131* 128* 138*   Cardiac Enzymes: No results for input(s): CKTOTAL, CKMB, CKMBINDEX, TROPONINI in the last 168 hours. BNP: Invalid input(s): POCBNP CBG: Recent Labs  Lab 07/09/19 0533  GLUCAP 90   D-Dimer No results for input(s): DDIMER in the last 72 hours. Hgb A1c Recent Labs    07/08/19 0513  HGBA1C 5.6   Lipid Profile Recent Labs    07/08/19 0513  CHOL 149  HDL 47  LDLCALC 86  TRIG 80  CHOLHDL 3.2   Thyroid function studies No results for input(s): TSH, T4TOTAL, T3FREE, THYROIDAB in the last 72 hours.  Invalid input(s): FREET3 Anemia work up No results for input(s): VITAMINB12, FOLATE, FERRITIN, TIBC, IRON, RETICCTPCT in the last 72 hours. Urinalysis    Component Value Date/Time   COLORURINE AMBER (A) 07/06/2019 2142   APPEARANCEUR CLOUDY (A) 07/06/2019 2142   LABSPEC 1.015 07/06/2019 2142   PHURINE 5.0 07/06/2019 2142   GLUCOSEU NEGATIVE 07/06/2019 2142   HGBUR LARGE (A) 07/06/2019 2142   BILIRUBINUR NEGATIVE 07/06/2019 2142   KETONESUR NEGATIVE 07/06/2019 2142   PROTEINUR 100 (A) 07/06/2019 2142   NITRITE NEGATIVE 07/06/2019 2142   LEUKOCYTESUR MODERATE (A) 07/06/2019 2142   Sepsis Labs Invalid input(s): PROCALCITONIN,  WBC,  LACTICIDVEN Microbiology Recent Results (from the past 240 hour(s))  Blood culture (routine x 2)     Status: None (Preliminary result)   Collection Time: 07/06/19  7:57 PM   Specimen: BLOOD  Result Value Ref Range Status   Specimen Description BLOOD RIGHT ANTECUBITAL  Final   Special Requests   Final    BOTTLES DRAWN AEROBIC AND ANAEROBIC Blood Culture results may not be optimal due to an excessive volume of blood received in culture bottles   Culture   Final    NO GROWTH 3 DAYS Performed at [facility], [address], [place], [place] [address]    Report Status PENDING  Incomplete  Blood culture (routine x 2)     Status: Abnormal   Collection Time: 07/06/19  7:57 PM   Specimen: BLOOD  Result Value Ref Range Status   Specimen Description   Final    BLOOD LEFT ANTECUBITAL Performed at [facility], [address] [place], [place] [address]    Special Requests   Final    BOTTLES DRAWN AEROBIC AND ANAEROBIC Blood Culture results may not be optimal due to an excessive volume of blood received in culture bottles Performed at [facility], [address] [place], [place] [address]    Culture  Setup Time   Final    GRAM POSITIVE COCCI AEROBIC BOTTLE ONLY CRITICAL RESULT CALLED TO, READ BACK BY AND VERIFIED WITH: LISA KLUTZ @1553  ON 07/07/2019 BY FMW Performed at [facility], [address] [place], [place]    Culture (A)  Final    STAPHYLOCOCCUS SPECIES (COAGULASE NEGATIVE) THE SIGNIFICANCE OF ISOLATING THIS ORGANISM FROM A SINGLE SET OF BLOOD CULTURES WHEN MULTIPLE SETS ARE DRAWN IS UNCERTAIN. PLEASE NOTIFY THE MICROBIOLOGY DEPARTMENT WITHIN ONE WEEK IF SPECIATION AND SENSITIVITIES ARE REQUIRED. Performed at [facility], [address] [place], [place]    Report Status 07/09/2019 FINAL  Final  SARS CORONAVIRUS 2 (TAT 6-24 HRS) Nasopharyngeal Nasopharyngeal Swab     Status: None   Collection Time: 07/06/19 11:44 PM   Specimen: Nasopharyngeal Swab  Result Value Ref Range Status   SARS Coronavirus 2 NEGATIVE NEGATIVE Final    Comment: (NOTE) SARS-CoV-2 target nucleic acids are NOT DETECTED. The SARS-CoV-2 RNA is generally detectable in upper and lower respiratory specimens during the acute phase of infection. Negative results do not preclude SARS-CoV-2 infection, do not rule out co-infections with other pathogens, and should not be used as the sole basis for treatment or other patient management decisions. Negative results must  be combined with clinical observations, patient history, and epidemiological information. The expected result is Negative. Fact Sheet for Patients: HairSlick.no Fact Sheet for Healthcare Providers: quierodirigir.com This test is not yet approved or cleared by the [place] FDA and  has been authorized for detection and/or diagnosis of SARS-CoV-2 by FDA under an Emergency Use Authorization (EUA). This EUA will remain  in effect (meaning this test can be used) for the duration of the COVID-19 declaration under Section 56 4(b)(1) of the Act, 21 U.S.C. section 360bbb-3(b)(1), unless the authorization is terminated or revoked sooner. Performed at [facility], [address] [place], [place] [address]   Today - UCx     Status: None   Collection Time: 07/08/19  8:20 AM   Specimen: Urine, Random  Result Value Ref Range Status   Specimen Description   Final    URINE, RANDOM Performed at [facility], [address] [place], [place] [address]    Special Requests   Final    NONE Performed at [facility], [address], [place], [place] [address]    Culture   Final    NO GROWTH Performed at [facility], [address] [place], [place] [address]    Report Status 07/09/2019 FINAL  Final    Time coordinating discharge:  35 minutes.  SIGNED:  Lorretta Harp, DO [facility] 07/09/2019, 1:13 PM Pager is on AMION  If 7PM-7AM, please contact night-coverage [email or web address] Password [email or web address]

## 2019-07-09 NOTE — Discharge Instructions (Signed)
You were cared for by a hospitalist during your hospital stay. If you have any questions about your discharge medications or the care you received while you were in the hospital after you are discharged, you can call the unit and ask to speak with the hospitalist on call if the hospitalist that took care of you is not available. Once you are discharged, your primary care physician will handle any further medical issues. Please note that NO REFILLS for any discharge medications will be authorized once you are discharged, as it is imperative that you return to your primary care physician (or establish a relationship with a primary care physician if you do not have one) for your aftercare needs so that they can reassess your need for medications and monitor your lab values.  Follow up with PCP and and cardiologist. Take all medications as prescribed. If symptoms change or worsen please return to the ED for evaluation  You have hematuria. Need to follow up with your with PCP.  May ask PCP to give referral to urology

## 2019-07-09 NOTE — TOC Transition Note (Signed)
Transition of Care [facility]) - CM/SW Discharge Note   Patient Details  Name: Carl Figueroa MRN: [id number] Date of Birth: 1945-09-07  Transition of Care [facility]) CM/SW Contact:  Su Hilt, RN Phone Number: 07/09/2019, 10:13 AM   Clinical Narrative:    Met with the patient and the wife in the room, He lives at home with his wife who is his caregiver He needs a RW and a BSC, notified Brad with [employer] His wife has used [facility] in the past and they want to use it now for [facility] services, Notified Corene Cornea with [facility] His wife requested that he have outpatient palliative at home, I notified [facility] and Santiago Glad with [facility] NO additional needs   Final next level of care: Home w Home Health Services Barriers to Discharge: Barriers Resolved   Patient Goals and CMS Choice Patient states their goals for this hospitalization and ongoing recovery are:: go home CMS Medicare.gov Compare Post Acute Care list provided to:: Patient Choice offered to / list presented to : Patient  Discharge Placement                       Discharge Plan and Services   Discharge Planning Services: CM Consult Post Acute Care Choice: Home Health          DME Arranged: 3-N-1, Walker rolling DME Agency: [employer] Date DME Agency Contacted: 07/09/19 Time DME Agency Contacted: 2023 Representative spoke with at DME Agency: [place]: PT, OT, RN, Nurse's Aide [place] Agency: [place] ([place]) Date National: 07/09/19 Time [place]: 1012 Representative spoke with at [place]: [place] ([place]) Interventions     Readmission Risk Interventions No flowsheet data found.

## 2019-07-09 NOTE — Progress Notes (Signed)
New referral for [facility] Palliative program to follow patient at home received from [facility]. Plan is for discharge home today. Patent information given to referral. Flo Shanks BSN, RN, [place] [phone]

## 2019-07-09 NOTE — Progress Notes (Signed)
Pt being discharged home, discharge instructions reviewed with pt and wife, states understanding, pt with no complaints at discharge, BSC and rolling walker with pt

## 2019-07-09 NOTE — Progress Notes (Signed)
Progress Note  Patient Name: Carl Figueroa Date of Encounter: 07/09/2019  Primary Cardiologist: Yvonne Kendall, MD  Subjective   Wife and sitter @ bedside.  Pt very sleepy after being up most of the night per wife.  No report of c/p or dyspnea.  Inpatient Medications    Scheduled Meds: . amLODipine  5 mg Oral Daily  . aspirin EC  81 mg Oral Daily  . baclofen  10 mg Oral QHS  . clonazePAM  0.25 mg Oral TID   Continuous Infusions: . sodium chloride 75 mL/hr at 07/09/19 0302  . cefTRIAXone (ROCEPHIN)  IV Stopped (07/08/19 2306)   PRN Meds: acetaminophen **OR** acetaminophen, hydrALAZINE, ondansetron **OR** ondansetron (ZOFRAN) IV   Vital Signs    Vitals:   07/08/19 1953 07/09/19 0419 07/09/19 0627 07/09/19 0858  BP: (!) 185/80 112/62  (!) 141/72  Pulse: 73 76  65  Resp: 17 17  17   Temp: 98.3 F (36.8 C) 98.5 F (36.9 C)  98.3 F (36.8 C)  TempSrc:    Oral  SpO2: 100% 99%  100%  Weight:   91.2 kg   Height:        Intake/Output Summary (Last 24 hours) at 07/09/2019 1000 Last data filed at 07/09/2019 0302 Gross per 24 hour  Intake 1522.04 ml  Output 200 ml  Net 1322.04 ml   Filed Weights   07/06/19 2201 07/09/19 0627  Weight: 74.8 kg 91.2 kg    Physical Exam   GEN: Well nourished, well developed, in no acute distress.  HEENT: Grossly normal.  Neck: Supple, no JVD, carotid bruits, or masses. Cardiac: RRR, no murmurs, rubs, or gallops. No clubbing, cyanosis, edema.  Radials/DP/PT 2+ and equal bilaterally.  Respiratory:  Respirations regular and unlabored, clear to auscultation bilaterally. GI: Soft, nontender, nondistended, BS + x 4. MS: no deformity or atrophy. Skin: warm and dry, no rash. Neuro:  Strength and sensation are intact. Psych: Very sleepy.  Opens eyes to name, grunts, falls back to sleep.  Labs    Chemistry Recent Labs  Lab 07/06/19 1957 07/07/19 0133 07/07/19 0137 07/07/19 0507 07/09/19 0711  NA 145  --   --  144 145  K 3.6  --    --  3.7 3.7  CL 108  --   --  114* 109  CO2 26  --   --  24 27  GLUCOSE 119*  --   --  103* 93  BUN 11  --   --  13 10  CREATININE 1.56* 1.60*  --  1.53* 1.22  CALCIUM 9.4  --   --  8.1* 9.0  PROT  --   --  5.3*  --   --   ALBUMIN  --   --  2.8*  --   --   AST  --   --  43*  --   --   ALT  --   --  13  --   --   ALKPHOS  --   --  57  --   --   BILITOT  --   --  0.9  --   --   GFRNONAA 43* 42*  --  44* 58*  GFRAA 50* 49*  --  52* >60  ANIONGAP 11  --   --  6 9     Hematology Recent Labs  Lab 07/07/19 0507 07/08/19 0513 07/09/19 0711  WBC 11.5* 7.5 4.5  RBC 4.01* 4.21* 4.07*  HGB 11.3* 11.9* 11.4*  HCT 34.6* 35.2* 34.0*  MCV 86.3 83.6 83.5  MCH 28.2 28.3 28.0  MCHC 32.7 33.8 33.5  RDW 12.9 13.0 12.9  PLT 131* 128* 138*    Cardiac Enzymes  Recent Labs  Lab 07/06/19 1957 07/07/19 0137 07/07/19 0325 07/08/19 1233 07/08/19 1505  TROPONINIHS 40* 180* 180* 66* 54*      Radiology    No results found.  Telemetry    Non-tele  Cardiac Studies   TTE 07/07/2019  1. Left ventricular ejection fraction, by visual estimation, is 60 to 65%. The left ventricle has normal function. There is mildly increased left ventricular hypertrophy.  2. Elevated left atrial pressure.  3. Left ventricular diastolic parameters are consistent with Grade I diastolic dysfunction (impaired relaxation).  4. The left ventricle has no regional wall motion abnormalities.  5. Global right ventricle has low normal systolic function.The right ventricular size is normal. Right vetricular wall thickness was not assessed.  6. Left atrial size was normal.  7. Right atrial size was normal.  8. The mitral valve is grossly normal. Trace mitral valve regurgitation. No evidence of mitral stenosis.  9. The tricuspid valve is not well visualized. Tricuspid valve regurgitation is not demonstrated. 10. The aortic valve was not well visualized. Aortic valve regurgitation is not visualized. Mild aortic valve  sclerosis without stenosis. 11. The pulmonic valve was not well visualized. Pulmonic valve regurgitation is not visualized. 12. TR signal is inadequate for assessing pulmonary artery systolic pressure. 13. The interatrial septum was not well visualized. _____________   Patient Profile     73 y.o. male with history of hypertension who presents due to generalized fatigue.  Found to be febrile with temperatures of 102.3 in the emergency room.  He was diagnosed with a UTI and started on antibiotics.  Troponins showed mild elevation deemed secondary to demand - supply mismatch and also renal dysfunction..  Assessment & Plan    1.  Demand Ischemia/Elevated Troponin:  Admitted w/ UTI, fever, and fatigue.  MIld hsTrop elev to 180.  This does not represent ACS.  No c/p.  Echo w/ nl EF, gr1 DD.  Will plan outpt f/u and stress testing for risk stratification.    2.  Essential HTN:  Trend improved since starting amlodipine.  Would cont 5mg  daily w/ low threshold to titrate to 10mg  daily if still trending >130 as an outpt.  3.  UTI:  abx per IM.  4.  Dementia: w/ sundowning.  Wife to discuss med mgmt w/ IM.  [facility] will sign off.   Medication Recommendations:  Cont amlodipine 5mg  daily - titrate to 10 daily if trends consistently > 130. Other recommendations (labs, testing, etc):  Office f/u in ~ 2 wks. We can arrange outpt stress testing @ that time.  Signed, Murray Hodgkins, NP  07/09/2019, 10:00 AM    For questions or updates, please contact   Please consult [email or web address] for contact info under Cardiology/STEMI.

## 2019-07-11 LAB — CULTURE, BLOOD (ROUTINE X 2): Culture: NO GROWTH

## 2019-07-29 ENCOUNTER — Ambulatory Visit (INDEPENDENT_AMBULATORY_CARE_PROVIDER_SITE_OTHER): Payer: Medicare Other | Admitting: Internal Medicine

## 2019-07-29 ENCOUNTER — Encounter: Payer: Self-pay | Admitting: Internal Medicine

## 2019-07-29 ENCOUNTER — Other Ambulatory Visit: Payer: Self-pay | Admitting: Acute Care

## 2019-07-29 ENCOUNTER — Other Ambulatory Visit: Payer: Self-pay

## 2019-07-29 VITALS — BP 124/66 | HR 74 | Ht 67.0 in | Wt 201.0 lb

## 2019-07-29 DIAGNOSIS — F419 Anxiety disorder, unspecified: Secondary | ICD-10-CM | POA: Diagnosis not present

## 2019-07-29 DIAGNOSIS — I10 Essential (primary) hypertension: Secondary | ICD-10-CM | POA: Diagnosis not present

## 2019-07-29 DIAGNOSIS — I248 Other forms of acute ischemic heart disease: Secondary | ICD-10-CM | POA: Diagnosis not present

## 2019-07-29 DIAGNOSIS — R413 Other amnesia: Secondary | ICD-10-CM

## 2019-07-29 MED ORDER — CLONAZEPAM 0.5 MG PO TABS: 0.2500 mg | ORAL_TABLET | Freq: Three times a day (TID) | ORAL | 0 refills | Status: DC

## 2019-07-29 NOTE — Patient Instructions (Addendum)
Medication Instructions:  Your physician recommends that you continue on your current medications as directed. Please refer to the Current Medication list given to you today.  *If you need a refill on your cardiac medications before your next appointment, please call your pharmacy*  Lab Work: none If you have labs (blood work) drawn today and your tests are completely normal, you will receive your results only by: Marland Kitchen MyChart Message (if you have MyChart) OR . A paper copy in the mail If you have any lab test that is abnormal or we need to change your treatment, we will call you to review the results.  Testing/Procedures: Your physician has requested that you have a lexiscan myoview. For further information please visit [email or web address]. Please follow instruction sheet, as given.  Gate  Your caregiver has ordered a Stress Test with nuclear imaging. The purpose of this test is to evaluate the blood supply to your heart muscle. This procedure is referred to as a "Non-Invasive Stress Test." This is because other than having an IV started in your vein, nothing is inserted or "invades" your body. Cardiac stress tests are done to find areas of poor blood flow to the heart by determining the extent of coronary artery disease (CAD). Some patients exercise on a treadmill, which naturally increases the blood flow to your heart, while others who are  unable to walk on a treadmill due to physical limitations have a pharmacologic/chemical stress agent called Lexiscan . This medicine will mimic walking on a treadmill by temporarily increasing your coronary blood flow.   Please note: these test may take anywhere between 2-4 hours to complete  PLEASE REPORT TO [facility] AT THE FIRST DESK WILL DIRECT YOU WHERE TO GO  Date of Procedure:_____________________________________  Arrival Time for Procedure:______________________________   PLEASE NOTIFY THE OFFICE AT  LEAST 24 HOURS IN ADVANCE IF YOU ARE UNABLE TO KEEP YOUR APPOINTMENT.  [phone] AND  PLEASE NOTIFY NUCLEAR MEDICINE AT [facility] AT LEAST 24 HOURS IN ADVANCE IF YOU ARE UNABLE TO KEEP YOUR APPOINTMENT. [phone]  How to prepare for your Myoview test:  1. Do not eat or drink after midnight 2. No caffeine for 24 hours prior to test 3. No smoking 24 hours prior to test. 4. Your medication may be taken with water.  If your doctor stopped a medication because of this test, do not take that medication. 5. Ladies, please do not wear dresses.  Skirts or pants are appropriate. Please wear a short sleeve shirt. 6. No perfume, cologne or lotion. 7. Wear comfortable walking shoes.   Follow-Up: At [facility], you and your health needs are our priority.  As part of our continuing mission to provide you with exceptional heart care, we have created designated Provider Care Teams.  These Care Teams include your primary Cardiologist (physician) and Advanced Practice Providers (APPs -  Physician Assistants and Nurse Practitioners) who all work together to provide you with the care you need, when you need it.  Your next appointment:   1 month(s)  The format for your next appointment:   In Person  Provider:    You may see Nelva Bush, MD or one of the following Advanced Practice Providers on your designated Care Team:    Murray Hodgkins, NP  Christell Faith, PA-C  Marrianne Mood, PA-C   Cardiac Nuclear Scan A cardiac nuclear scan is a test that measures blood flow to the heart when a person is resting and when  he or she is exercising. The test looks for problems such as:  Not enough blood reaching a portion of the heart.  The heart muscle not working normally. You may need this test if:  You have heart disease.  You have had abnormal lab results.  You have had heart surgery or a balloon procedure to open up blocked arteries (angioplasty).  You have chest pain.  You have shortness  of breath. In this test, a radioactive dye (tracer) is injected into your bloodstream. After the tracer has traveled to your heart, an imaging device is used to measure how much of the tracer is absorbed by or distributed to various areas of your heart. This procedure is usually done at a hospital and takes 2-4 hours. Tell a health care provider about:  Any allergies you have.  All medicines you are taking, including vitamins, herbs, eye drops, creams, and over-the-counter medicines.  Any problems you or family members have had with anesthetic medicines.  Any blood disorders you have.  Any surgeries you have had.  Any medical conditions you have.  Whether you are pregnant or may be pregnant. What are the risks? Generally, this is a safe procedure. However, problems may occur, including:  Serious chest pain and heart attack. This is only a risk if the stress portion of the test is done.  Rapid heartbeat.  Sensation of warmth in your chest. This usually passes quickly.  Allergic reaction to the tracer. What happens before the procedure?  Ask your health care provider about changing or stopping your regular medicines. This is especially important if you are taking diabetes medicines or blood thinners.  Follow instructions from your health care provider about eating or drinking restrictions.  Remove your jewelry on the day of the procedure. What happens during the procedure?  An IV will be inserted into one of your veins.  Your health care provider will inject a small amount of radioactive tracer through the IV.  You will wait for 20-40 minutes while the tracer travels through your bloodstream.  Your heart activity will be monitored with an electrocardiogram (ECG).  You will lie down on an exam table.  Images of your heart will be taken for about 15-20 minutes.  You may also have a stress test. For this test, one of the following may be done: ? You will exercise on a  treadmill or stationary bike. While you exercise, your heart's activity will be monitored with an ECG, and your blood pressure will be checked. ? You will be given medicines that will increase blood flow to parts of your heart. This is done if you are unable to exercise.  When blood flow to your heart has peaked, a tracer will again be injected through the IV.  After 20-40 minutes, you will get back on the exam table and have more images taken of your heart.  Depending on the type of tracer used, scans may need to be repeated 3-4 hours later.  Your IV line will be removed when the procedure is over. The procedure may vary among health care providers and hospitals. What happens after the procedure?  Unless your health care provider tells you otherwise, you may return to your normal schedule, including diet, activities, and medicines.  Unless your health care provider tells you otherwise, you may increase your fluid intake. This will help to flush the contrast dye from your body. Drink enough fluid to keep your urine pale yellow.  Ask your health care provider,  or the department that is doing the test: ? When will my results be ready? ? How will I get my results? Summary  A cardiac nuclear scan measures the blood flow to the heart when a person is resting and when he or she is exercising.  Tell your health care provider if you are pregnant.  Before the procedure, ask your health care provider about changing or stopping your regular medicines. This is especially important if you are taking diabetes medicines or blood thinners.  After the procedure, unless your health care provider tells you otherwise, increase your fluid intake. This will help flush the contrast dye from your body.  After the procedure, unless your health care provider tells you otherwise, you may return to your normal schedule, including diet, activities, and medicines. This information is not intended to replace advice  given to you by your health care provider. Make sure you discuss any questions you have with your health care provider. Document Released: 08/31/2004 Document Revised: 01/20/2018 Document Reviewed: 01/20/2018 Elsevier Patient Education  2020 [employer].

## 2019-07-29 NOTE — Progress Notes (Signed)
Follow-up Outpatient Visit Date: 07/29/2019  Primary Care Provider: Leonel Ramsay, MD [place] [address]  Chief Complaint: Follow-up elevated troponin during recent hospitalization  HPI:  Mr. Livingood is a 73 y.o. 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  Who presented to the [facility] emergency department on 07/06/2019 due to altered mental status and an episode of hematuria.  He was found to be febrile and tachycardic with concern for sepsis.  As part of his work-up, troponins were checked and noted to be (high-sensitivity troponin I peak 180).  He did not have any chest pain nor shortness of breath.  He felt close to his baseline upon our initial evaluation.  Echocardiogram showed normal LVEF without wall motion abnormality.  Troponin elevation was attributed to supply-demand mismatch in the setting of his acute infection.  Today, Mr. Diona Foley reports that he is feeling close to his baseline.  He still has some fatigue.  He denies further hematuria.  His mental status is back to baseline.  He has not experienced any chest pain, shortness of breath, palpitations, or lightheadedness.  His mobility remains somewhat limited by chronic low back and leg pain.  --------------------------------------------------------------------------------------------------  Past Medical History:  Diagnosis Date  . Hypertension    Past Surgical History:  Procedure Laterality Date  . ankle surgry      Current Meds  Medication Sig  . amLODipine (NORVASC) 5 MG tablet Take 1 tablet (5 mg total) by mouth daily.  Marland Kitchen aspirin EC 81 MG EC tablet Take 1 tablet (81 mg total) by mouth daily.  Marland Kitchen atorvastatin (LIPITOR) 40 MG tablet Take 1 tablet (40 mg total) by mouth daily.  . baclofen (LIORESAL) 10 MG tablet Take 10 mg by mouth at bedtime.  .  clonazePAM (KLONOPIN) 0.5 MG tablet Take 0.5 tablets (0.25 mg total) by mouth 3 (three) times daily.  . [DISCONTINUED] clonazePAM (KLONOPIN) 0.5 MG tablet Take 0.25 mg by mouth 3 (three) times daily.   . [DISCONTINUED] clonazePAM (KLONOPIN) 0.5 MG tablet Take 0.5 tablets (0.25 mg total) by mouth 3 (three) times daily.    Allergies: Morphine  Social History   Tobacco Use  . Smoking status: Never Smoker  . Smokeless tobacco: Never Used  Substance Use Topics  . Alcohol use: No  . Drug use: No    Family History  Problem Relation Age of Onset  . Diabetes Mellitus II Neg Hx     Review of Systems: A 12-system review of systems was performed and was negative except as noted in the HPI.  --------------------------------------------------------------------------------------------------  Physical Exam: BP 124/66 (BP Location: Right Arm, Patient Position: Sitting, Cuff Size: Normal)   Pulse 74   Ht 5\' 7"  (1.702 m)   Wt 201 lb (91.2 kg)   BMI 31.48 kg/m   General: NAD. HEENT: No conjunctival pallor or scleral icterus. Facemask in place. Neck: Supple without lymphadenopathy, thyromegaly, JVD, or HJR. Lungs: Normal work of breathing. Clear to auscultation bilaterally without wheezes or crackles. Heart: Regular rate and rhythm without murmurs, rubs, or gallops. Non-displaced PMI. Abd: Bowel sounds present. Soft, NT/ND without hepatosplenomegaly Ext: Trace pretibial edema bilaterally. Skin: Warm and dry without rash.  EKG: Normal sinus rhythm with borderline LVH and nonspecific ST/T changes.  Compared with prior tracing from 07/06/2019, heart rate has decreased.  Lateral T wave inversions are also less pronounced.  Lab Results  Component Value Date   WBC 4.5 07/09/2019   HGB 11.4 (L) 07/09/2019   HCT 34.0 (L) 07/09/2019   MCV 83.5 07/09/2019   PLT 138 (L) 07/09/2019    Lab Results  Component Value Date   NA 145 07/09/2019   K 3.7 07/09/2019   CL 109 07/09/2019   CO2 27  07/09/2019   BUN 10 07/09/2019   CREATININE 1.22 07/09/2019   GLUCOSE 93 07/09/2019   ALT 13 07/07/2019    Lab Results  Component Value Date   CHOL 149 07/08/2019   HDL 47 07/08/2019   LDLCALC 86 07/08/2019   TRIG 80 07/08/2019   CHOLHDL 3.2 07/08/2019    --------------------------------------------------------------------------------------------------  ASSESSMENT AND PLAN: Demand ischemia: I suspect recent troponin elevation in the setting of urosepsis and altered mental status was due to supply-demand mismatch.  Mr. Kemler does not report any ischemic symptoms.  His EKG shows LVH with nonspecific ST/T changes.  Given his cardiac risk factors and findings during recent hospitalization, I recommended that we proceed with pharmacologic myocardial perfusion stress test.  Mr. Combes is in agreement with this.  We will plan to continue aspirin and atorvastatin for secondary prevention pending results of the stress test.  Hypertension: Blood pressure well controlled today.  Some lower extremity edema noted on exam today, though Mr. Custis thinks it stable from his baseline.  He notes that he was previously on lisinopril, having been switched to amlodipine following his recent hospitalization in the setting of AKI.  Given that he is tolerating this well, we will not make any medication changes today.  Anxiety: Mr. Nusz uses clonazepam managed by psychiatry through the [place].  He ran out of the medication today and has yet to receive the refill requested through the [facility].  I advised him to contact his mental health provider at the [facility] as soon as possible.  I will provide him with a 1 week prescription for clonazepam 0.25 mg 3 times daily with no refills.  I advised him that further prescriptions for clonazepam will need to come from his mental health provider.  Follow-up: Return to clinic in 1 month.  Yvonne Kendall, MD 07/30/2019 7:44 AM

## 2019-07-30 ENCOUNTER — Encounter: Payer: Self-pay | Admitting: Internal Medicine

## 2019-07-30 DIAGNOSIS — F32A Depression, unspecified: Secondary | ICD-10-CM | POA: Insufficient documentation

## 2019-07-30 DIAGNOSIS — I2489 Other forms of acute ischemic heart disease: Secondary | ICD-10-CM | POA: Insufficient documentation

## 2019-07-30 DIAGNOSIS — F419 Anxiety disorder, unspecified: Secondary | ICD-10-CM | POA: Insufficient documentation

## 2019-07-30 DIAGNOSIS — I248 Other forms of acute ischemic heart disease: Secondary | ICD-10-CM | POA: Insufficient documentation

## 2019-08-03 ENCOUNTER — Ambulatory Visit: Admission: RE | Admit: 2019-08-03 | Payer: Medicare Other | Source: Ambulatory Visit

## 2019-08-05 ENCOUNTER — Telehealth: Payer: Self-pay | Admitting: Adult Health Nurse Practitioner

## 2019-08-05 NOTE — Telephone Encounter (Signed)
Spoke with patient regarding Palliative services and all questions were answered and he was in agreement with this.  I have scheduled an In-Person Consult for 08/18/19 @ 2 PM.

## 2019-08-05 NOTE — Telephone Encounter (Signed)
Consult was scheduled for 08/20/19 instead of 08/18/19

## 2019-08-12 ENCOUNTER — Other Ambulatory Visit: Payer: Self-pay

## 2019-08-12 ENCOUNTER — Ambulatory Visit: Payer: Medicare Other

## 2019-08-12 ENCOUNTER — Ambulatory Visit
Admission: RE | Admit: 2019-08-12 | Discharge: 2019-08-12 | Disposition: A | Discharge status: Home / Self Care | Payer: Medicare Other | Source: Ambulatory Visit | Attending: Acute Care | Admitting: Acute Care

## 2019-08-12 DIAGNOSIS — R413 Other amnesia: Secondary | ICD-10-CM | POA: Diagnosis present

## 2019-08-12 IMAGING — MR MR HEAD W/O CM
12 series · 42 of 48 positions shown · non-contrast
Comparison: None.

CLINICAL DATA: 73-year-old male with memory loss, headaches,
occasional blurred vision.

EXAM:
MRI HEAD WITHOUT CONTRAST
TECHNIQUE: Multiplanar, multiecho pulse sequences of the brain and surrounding
structures were obtained without intravenous contrast.

[Series 11: ax dwi_tracew · axial · 3.0mm · 0.60mm/px · z∈[-114,+40]mm · 6 of 96 slices shown]
[im 1/96]
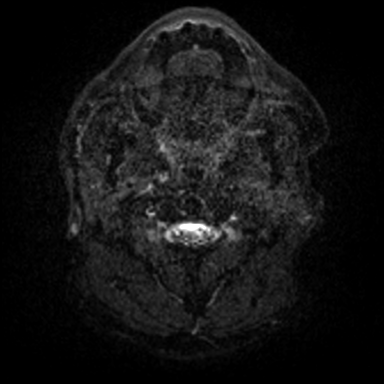
[im 20/96]
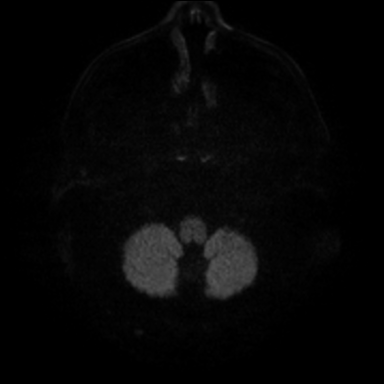
[im 39/96]
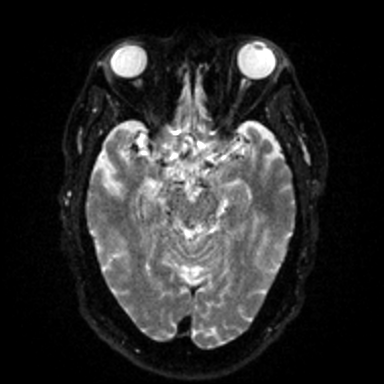
[im 58/96]
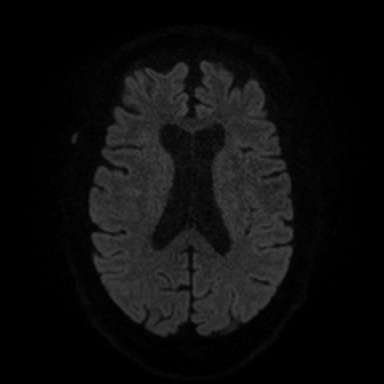
[im 77/96]
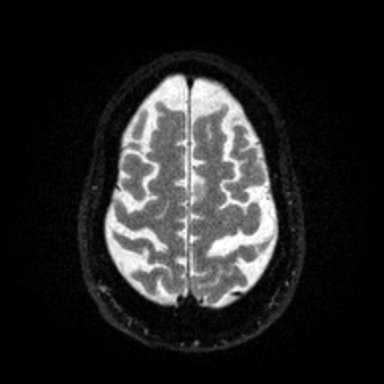
[im 96/96]
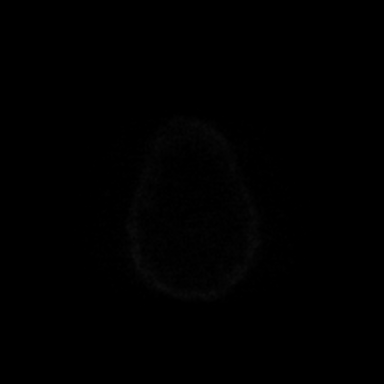

[Series 12: ax dwi_adc · axial · 3.0mm · 0.60mm/px · z∈[-114,+40]mm · 3 of 48 slices shown]
[im 1/48]
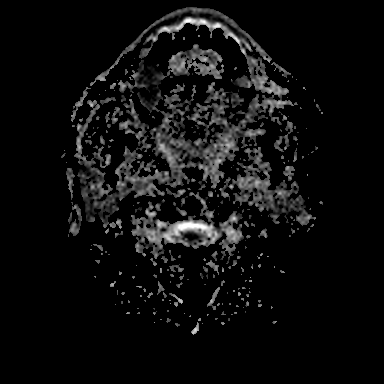
[im 24/48]
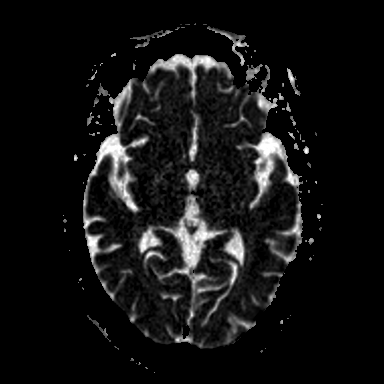
[im 48/48]
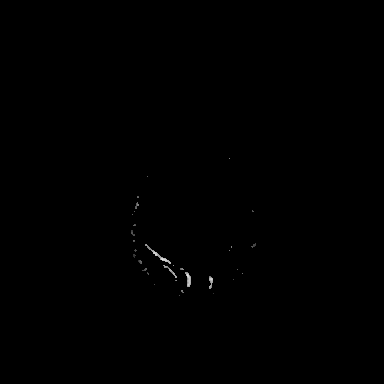

[Series 13: cor dwi_tracew · coronal · 5.0mm · 0.60mm/px · 3 of 38 slices shown]
[im 1/38]
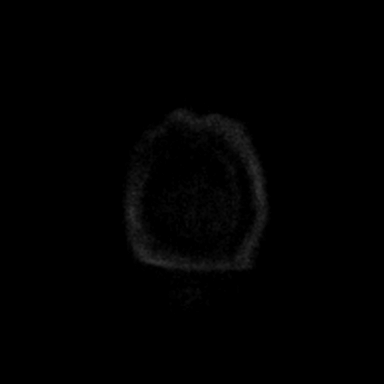
[im 19/38]
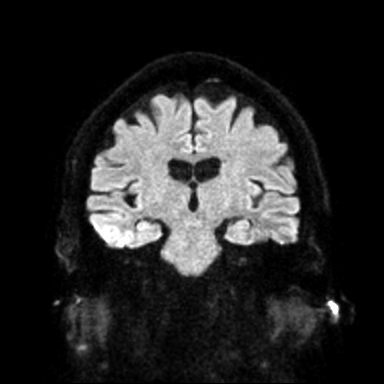
[im 38/38]
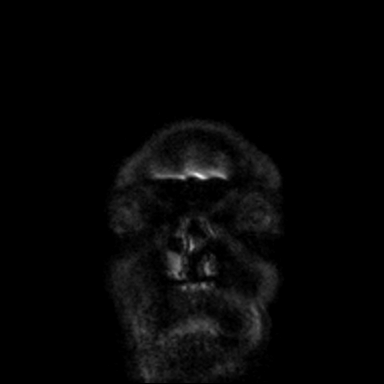

[Series 14: cor dwi_adc · coronal · 5.0mm · 0.60mm/px · 3 of 38 slices shown]
[im 1/38]
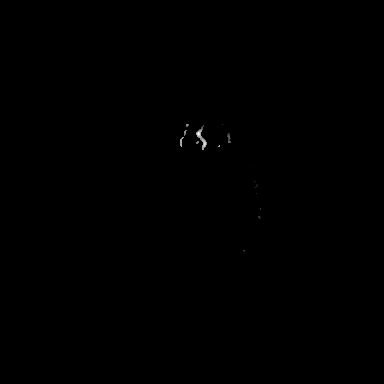
[im 19/38]
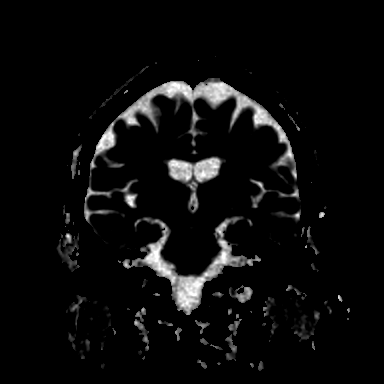
[im 38/38]
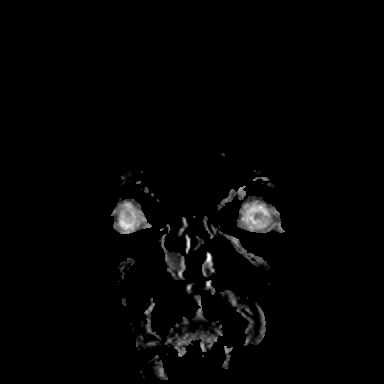

[Series 15: T1 · sagittal · 5.0mm · 0.62mm/px · 2 of 22 slices shown (1 of 2)]
[im 1/22]
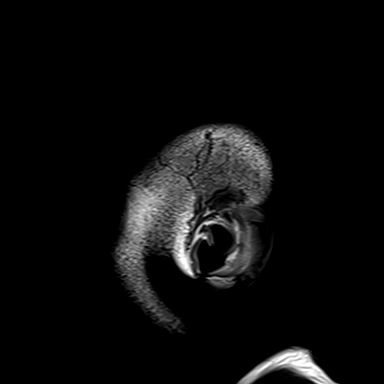
[im 22/22]
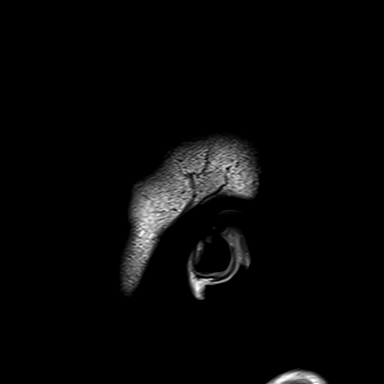

[Series 16: T2 · axial · 5.0mm · 0.53mm/px · z∈[-109,+34]mm · 2 of 25 slices shown (1 of 2)]
[im 1/25]
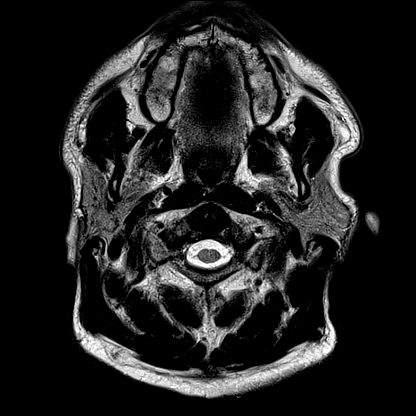
[im 25/25]
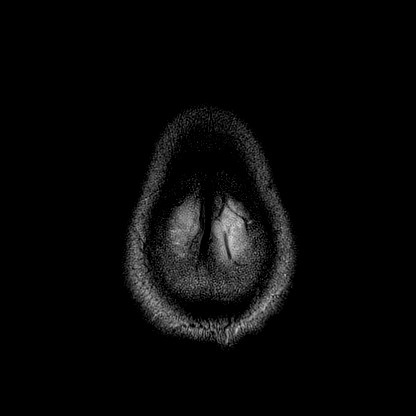

[Series 17: mag_images · axial · 3.0mm · 0.90mm/px · z∈[-125,+51]mm · 4 of 60 slices shown]
[im 1/60]
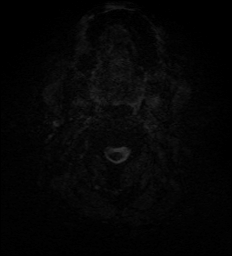
[im 20/60]
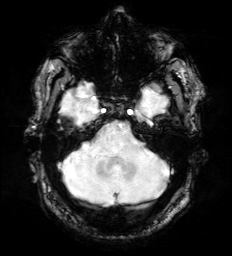
[im 40/60]
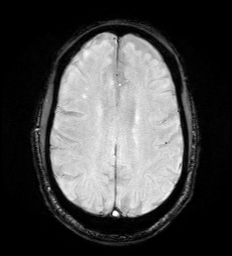
[im 60/60]
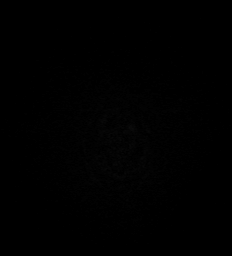

[Series 18: pha_images · axial · 3.0mm · 0.90mm/px · z∈[-125,+51]mm · 4 of 60 slices shown]
[im 1/60]
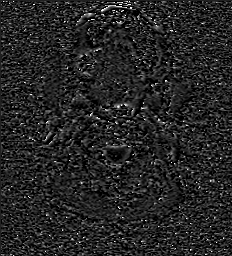
[im 20/60]
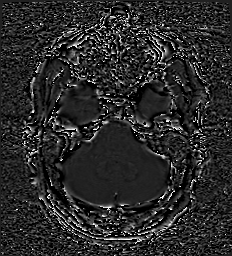
[im 40/60]
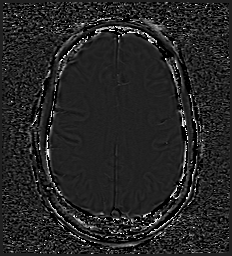
[im 60/60]
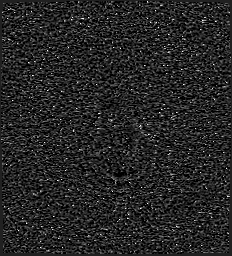

[Series 19: swi_images · axial · 3.0mm · 0.90mm/px · 1 of 60 slices shown]
[im 1/60]
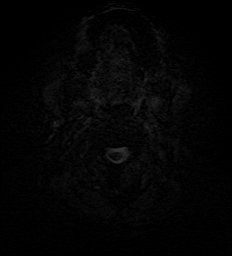

[Series 21: FLAIR · axial · 3.0mm · 0.53mm/px · z∈[-118,+43]mm · 4 of 55 slices shown]
[im 1/55]
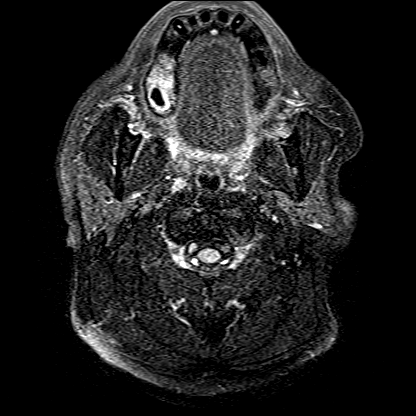
[im 19/55]
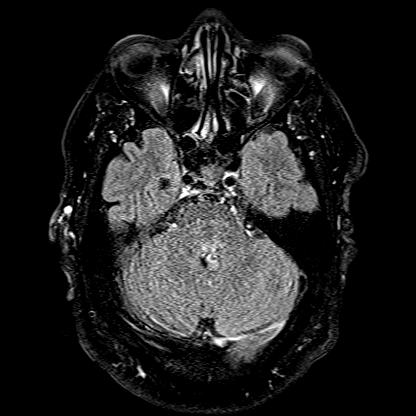
[im 37/55]
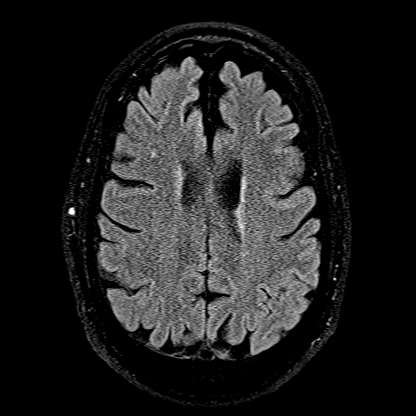
[im 55/55]
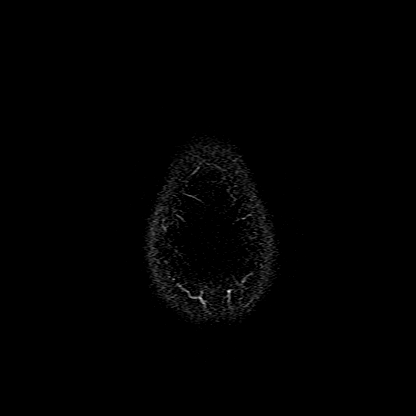

[Series 22: T1 · axial · 1.0mm · 0.98mm/px · z∈[-115,+43]mm · 8 of 160 slices shown (2 of 2)]
[im 1/160]
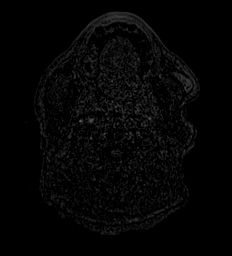
[im 32/160]
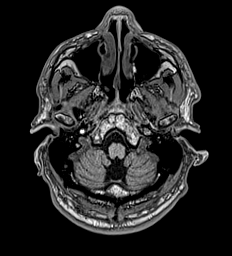
[im 48/160]
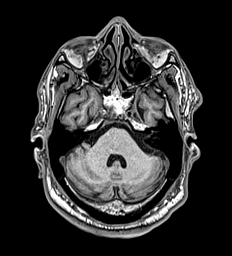
[im 64/160]
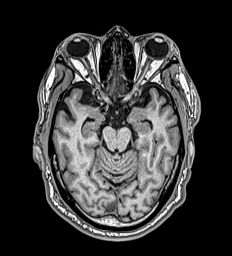
[im 96/160]
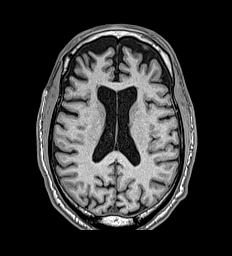
[im 112/160]
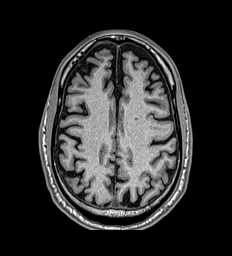
[im 128/160]
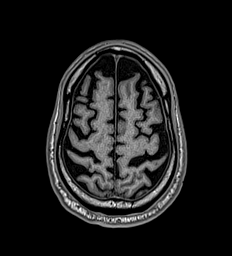
[im 160/160]
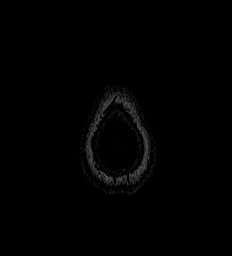

[Series 23: T2 · coronal · 5.0mm · 0.57mm/px · 2 of 29 slices shown (2 of 2)]
[im 1/29]
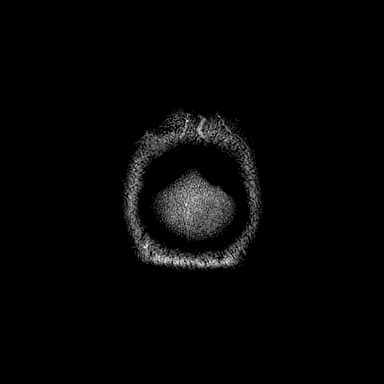
[im 29/29]
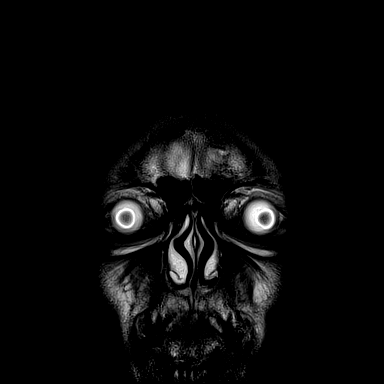

[42 of 48 positions shown; findings below may reference images not displayed]

FINDINGS: Brain: No restricted diffusion to suggest acute infarction. No
midline shift, mass effect, evidence of mass lesion,
ventriculomegaly, extra-axial collection or acute intracranial
hemorrhage. Cervicomedullary junction and pituitary are within
normal limits.

Gray and white matter signal is within normal limits for age
throughout the brain. There is cerebral volume loss which appears
generalized. This appears to affect the mesial temporal lobes
equally, with no areas of disproportionate brain atrophy identified.
No cortical encephalomalacia or chronic cerebral blood products
identified. Deep gray nuclei are normal. There is minimal
nonspecific T2 heterogeneity in the pons.

Vascular: Major intracranial vascular flow voids are preserved.

Skull and upper cervical spine: Negative for age visible cervical
spine. Normal bone marrow signal.

Sinuses/Orbits: Negative orbits. Trace paranasal sinus mucosal
thickening.

Other: Mastoids are clear. Visible internal auditory structures
appear normal. Scalp and face soft tissues appear negative.
IMPRESSION: 1. No acute intracranial abnormality
2. Generalized cerebral volume loss, but otherwise unremarkable for
age non-contrast MRI appearance of the brain.

## 2019-08-20 ENCOUNTER — Other Ambulatory Visit: Payer: Medicare Other | Admitting: Adult Health Nurse Practitioner

## 2019-08-20 ENCOUNTER — Other Ambulatory Visit: Payer: Self-pay

## 2019-08-20 DIAGNOSIS — R413 Other amnesia: Secondary | ICD-10-CM

## 2019-08-20 DIAGNOSIS — Z515 Encounter for palliative care: Secondary | ICD-10-CM

## 2019-08-22 NOTE — Progress Notes (Signed)
Therapist, nutritional Palliative Care Consult Note Telephone: [phone]  Fax: [phone]  PATIENT NAME: Carl Figueroa DOB: 01-27-1946 MRN: [id number]  PRIMARY CARE PROVIDER:   Mick Sell, MD  REFERRING PROVIDER:  Mick Sell, MD [address] [place],  [place] [address]  RESPONSIBLE PARTY:   Self and wife, Carl Figueroa  [phone] Wife's cell [phone]     RECOMMENDATIONS and PLAN:  1.  Advanced care planning.Patient is full code. Did not go over today.  Will go over on a future visit  2.  OA.  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  He is able to ambulate with cane or walker. He is independent of ADLs and can help with cooking and house cleaning. Patient plans to have left hip replacement surgery as soon as they start opening up for elective surgery again in a couple weeks.  Date for surgery has not been scheduled yet.  Patient is to have a surgery in which he can go home within 24 hours of surgery.  Wife has concerns that he does have delirium related to anesthesia from past experience.  He also is having recent memory loss which may impair his judgement after surgery.  They do not want him to go into a facility for rehab after the surgery due to COVID.  Their goal is to come home after surgery and to have PT/OT at home.  They want to make sure that he does not try to get up inadvertently causing a fall that would damage the hip after surgery. Wife has lupus and will be unable to physically help with care requiring any lifting or moving of the patient.   They are seeking help in the home for his safety and recovery.  Home health services should be set up prior to coming home after surgery by surgeon.  We discussed options for 24/7 care in the home for at least a week after surgery.  Will reach out to SW to contact patient and wife to help with list of agencies they can call to get help in the home and with any other resources  that may be helpful during this time.    As patient does not have any needs at this time and surgery has not yet been scheduled, they are encouraged to call when surgery has been set up to schedule next appointment and see what may need to be done prior to surgery.  Related to his memory loss he is seeing neurology and an MRI has been done but results not yet relayed to patient.  He was started on donepezil and wife states that he is sleeping less and is less "lazy" since starting it.  Palliative will continue to monitor for any needs or symptom management post surgery and address ACP.    I spent 45 minutes providing this consultation,  from 3:00 to 3:45. More than 50% of the time in this consultation was spent coordinating communication.   HISTORY OF PRESENT ILLNESS:  Carl Figueroa is a 74 y.o. year old male with multiple medical problems including memory loss or impairment, HTN, OA. Palliative Care was asked to help address goals of care.   CODE STATUS: full code  PPS: 60% HOSPICE ELIGIBILITY/DIAGNOSIS: TBD  PHYSICAL EXAM:  BP 122/72  HR  56  O2 98% on RA General: sitting on couch in NAD Cardiovascular: regular rate and rhythm Pulmonary: slung sounds clear; normal respiratory effort Abdomen: soft, nontender, +  bowel sounds GU: no suprapubic tenderness Extremities: no edema, no joint deformities Skin: no rashes Neurological: Weakness but otherwise nonfocal   PAST MEDICAL HISTORY:  Past Medical History:  Diagnosis Date  . Hypertension     SOCIAL HX:  Social History   Tobacco Use  . Smoking status: Never Smoker  . Smokeless tobacco: Never Used  Substance Use Topics  . Alcohol use: No    ALLERGIES:  Allergies  Allergen Reactions  . Morphine Nausea Only     PERTINENT MEDICATIONS:  Outpatient Encounter Medications as of 08/20/2019  Medication Sig  . amLODipine (NORVASC) 5 MG tablet Take 1 tablet (5 mg total) by mouth daily.  Marland Kitchen aspirin EC 81 MG EC tablet Take 1 tablet (81  mg total) by mouth daily.  Marland Kitchen atorvastatin (LIPITOR) 40 MG tablet Take 1 tablet (40 mg total) by mouth daily.  . baclofen (LIORESAL) 10 MG tablet Take 10 mg by mouth at bedtime.  . clonazePAM (KLONOPIN) 0.5 MG tablet Take 0.5 tablets (0.25 mg total) by mouth 3 (three) times daily.   No facility-administered encounter medications on file as of 08/20/2019.     Carl Figueroa Jenetta Downer, NP

## 2019-08-26 ENCOUNTER — Telehealth: Payer: Self-pay

## 2019-08-26 NOTE — Telephone Encounter (Signed)
Call to patient to discuss upcoming appt 1/11. myoview has not been completed at this time. Per caregiver, she was not aware that it had been set up for 12/14 when he was a no show.   I have routed message to scheduling to see if we can get appt prior to OV.

## 2019-08-27 ENCOUNTER — Telehealth: Payer: Self-pay

## 2019-08-27 NOTE — Telephone Encounter (Signed)
SW received referral from NP to contact patient/family to discuss in-home care resources. SW attempted to contact patient's wife, Fannie Knee. No answer. SW left VM with contact information.

## 2019-08-31 ENCOUNTER — Telehealth: Payer: Self-pay

## 2019-08-31 ENCOUNTER — Ambulatory Visit: Payer: Medicare Other | Admitting: Internal Medicine

## 2019-08-31 NOTE — Telephone Encounter (Signed)
SW received referral from NP to contact patient/family. SW attempted to contact patient's wife, Fannie Knee. No answer. SW left VM with contact information.

## 2019-09-04 ENCOUNTER — Other Ambulatory Visit: Payer: Self-pay

## 2019-09-04 ENCOUNTER — Encounter
Admission: RE | Admit: 2019-09-04 | Discharge: 2019-09-04 | Disposition: A | Discharge status: Home / Self Care | Payer: Medicare Other | Source: Ambulatory Visit | Attending: Internal Medicine | Admitting: Internal Medicine

## 2019-09-04 DIAGNOSIS — I248 Other forms of acute ischemic heart disease: Secondary | ICD-10-CM | POA: Diagnosis present

## 2019-09-04 LAB — NM MYOCAR MULTI W/SPECT W/WALL MOTION / EF
LV dias vol: 74 mL (ref 62–150)
LV sys vol: 31 mL
Peak HR: 84 {beats}/min
Percent HR: 57 %
Rest HR: 56 {beats}/min
SDS: 0
SRS: 2
SSS: 0
TID: 0.85

## 2019-09-04 MED ORDER — REGADENOSON 0.4 MG/5ML IV SOLN
0.4000 mg | Freq: Once | INTRAVENOUS | Status: AC
  Administered 2019-09-04: 0.4 mg via INTRAVENOUS

## 2019-09-04 MED ORDER — TECHNETIUM TC 99M TETROFOSMIN IV KIT
10.0000 | PACK | Freq: Once | INTRAVENOUS | Status: AC | PRN
  Administered 2019-09-04: 10.91 via INTRAVENOUS

## 2019-09-04 MED ORDER — TECHNETIUM TC 99M TETROFOSMIN IV KIT
31.3800 | PACK | Freq: Once | INTRAVENOUS | Status: AC | PRN
  Administered 2019-09-04: 31.38 via INTRAVENOUS

## 2019-09-10 ENCOUNTER — Encounter: Payer: Self-pay | Admitting: *Deleted

## 2019-09-16 ENCOUNTER — Other Ambulatory Visit: Payer: Self-pay

## 2019-09-16 ENCOUNTER — Encounter: Payer: Self-pay | Admitting: Internal Medicine

## 2019-09-16 ENCOUNTER — Ambulatory Visit (INDEPENDENT_AMBULATORY_CARE_PROVIDER_SITE_OTHER): Payer: Medicare Other | Admitting: Internal Medicine

## 2019-09-16 VITALS — BP 142/92 | HR 64 | Ht 66.5 in | Wt 196.5 lb

## 2019-09-16 DIAGNOSIS — I10 Essential (primary) hypertension: Secondary | ICD-10-CM | POA: Diagnosis not present

## 2019-09-16 DIAGNOSIS — R778 Other specified abnormalities of plasma proteins: Secondary | ICD-10-CM | POA: Diagnosis not present

## 2019-09-16 NOTE — Patient Instructions (Signed)
Medication Instructions:  Your physician recommends that you continue on your current medications as directed. Please refer to the Current Medication list given to you today.  *If you need a refill on your cardiac medications before your next appointment, please call your pharmacy*  Lab Work: none If you have labs (blood work) drawn today and your tests are completely normal, you will receive your results only by: Marland Kitchen MyChart Message (if you have MyChart) OR . A paper copy in the mail If you have any lab test that is abnormal or we need to change your treatment, we will call you to review the results.  Testing/Procedures: none  Follow-Up: At [facility], you and your health needs are our priority.  As part of our continuing mission to provide you with exceptional heart care, we have created designated Provider Care Teams.  These Care Teams include your primary Cardiologist (physician) and Advanced Practice Providers (APPs -  Physician Assistants and Nurse Practitioners) who all work together to provide you with the care you need, when you need it.  Your next appointment:   As needed.

## 2019-09-16 NOTE — Progress Notes (Signed)
Follow-up Outpatient Visit Date: 09/16/2019  Primary Care Provider: Leonel Ramsay, MD [place] [address]  Chief Complaint: Follow-up elevated troponin and stress test  HPI:  Carl Figueroa is a 74 y.o. 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  I last saw him in the office in early December, at which time he was almost back to baseline though he continued to have some fatigue.  Previous hematuria that initially led to his hospitalization in November had resolved.  He denied chest pain and shortness of breath.  Subsequent myocardial perfusion stress test was normal without ischemia or scar.  Today, Carl Figueroa feels well from a heart standpoint, denying chest pain, shortness of breath, palpitations, and lightheadedness.  He notes minimal edema in his calves, which has been chronic and stable.  He notes occasional numbness and funny feeling in his right forearm and hand when bending his elbow for extended periods.  He denies further hematuria and confusion like what brought him to the ED in November.  --------------------------------------------------------------------------------------------------  Past Medical History:  Diagnosis Date  . Hypertension    Past Surgical History:  Procedure Laterality Date  . ankle surgry    . FOOT SURGERY      Current Meds  Medication Sig  . amLODipine (NORVASC) 5 MG tablet Take 1 tablet (5 mg total) by mouth daily.  Marland Kitchen aspirin EC 81 MG EC tablet Take 1 tablet (81 mg total) by mouth daily.  Marland Kitchen atorvastatin (LIPITOR) 40 MG tablet Take 1 tablet (40 mg total) by mouth daily.  . baclofen (LIORESAL) 10 MG tablet Take 10 mg by mouth at bedtime.  . clonazePAM (KLONOPIN) 0.5 MG tablet Take 0.5 tablets (0.25 mg total) by mouth 3 (three) times daily.  Marland Kitchen donepezil  (ARICEPT) 5 MG tablet Take 5 mg by mouth daily.    Allergies: Morphine  Social History   Tobacco Use  . Smoking status: Never Smoker  . Smokeless tobacco: Never Used  Substance Use Topics  . Alcohol use: No  . Drug use: No    Family History  Problem Relation Age of Onset  . Cancer Mother   . Diabetes Mellitus II Neg Hx     Review of Systems: A 12-system review of systems was performed and was negative except as noted in the HPI.  --------------------------------------------------------------------------------------------------  Physical Exam: BP (!) 142/92 (BP Location: Left Arm, Patient Position: Sitting, Cuff Size: Normal)   Pulse 64   Ht 5' 6.5" (1.689 m)   Wt 196 lb 8 oz (89.1 kg)   SpO2 99%   BMI 31.24 kg/m   General:  NAD. HEENT: No conjunctival pallor or scleral icterus. Facemask in place. Neck: Supple without lymphadenopathy, thyromegaly, JVD, or HJR. Lungs: Normal work of breathing. Clear to auscultation bilaterally without wheezes or crackles. Heart: Regular rate and rhythm without murmurs, rubs, or gallops. Non-displaced PMI. Abd: Bowel sounds present. Soft, NT/ND without hepatosplenomegaly Ext: Trace pretibial edema. Radial, PT, and DP pulses are 2+ bilaterally. Skin: Warm and dry without rash.  EKG:  NSR with borderline LVH and nonspecific ST/T changes.  No significant change from prior tracing on 07/29/2019.  Lab Results  Component Value Date   WBC 4.5 07/09/2019   HGB 11.4 (L) 07/09/2019   HCT 34.0 (L) 07/09/2019   MCV 83.5 07/09/2019   PLT 138 (L) 07/09/2019  Lab Results  Component Value Date   NA 145 07/09/2019   K 3.7 07/09/2019   CL 109 07/09/2019   CO2 27 07/09/2019   BUN 10 07/09/2019   CREATININE 1.22 07/09/2019   GLUCOSE 93 07/09/2019   ALT 13 07/07/2019    Lab Results  Component Value Date   CHOL 149 07/08/2019   HDL 47 07/08/2019   LDLCALC 86 07/08/2019   TRIG 80 07/08/2019   CHOLHDL 3.2 07/08/2019     --------------------------------------------------------------------------------------------------  ASSESSMENT AND PLAN: Elevated troponin: Most likely related to supply-demand mismatch in the setting of urosepsis in 06/2019.  Carl Figueroa did not have ischemic symptoms at that time and remains asymptomatic.  Myocardial perfusion stress test was without ischemia or scar.  I recommend primary prevention of CAD.  No further cardiac testing or intervention is recommended.  Hypertension: Blood pressure mildly elevated today.  I stressed the importance of sodium restriction.  Carl Figueroa should continue current dose of amlodipine and continue follow-up with his PCP.  Follow-up: Return to clinic as needed.  Yvonne Kendall, MD 09/16/2019 1:46 PM

## 2019-10-19 ENCOUNTER — Other Ambulatory Visit: Payer: Self-pay | Admitting: Orthopedic Surgery

## 2019-10-22 ENCOUNTER — Other Ambulatory Visit: Payer: Self-pay

## 2019-10-22 ENCOUNTER — Encounter
Admission: RE | Admit: 2019-10-22 | Discharge: 2019-10-22 | Disposition: A | Discharge status: Home / Self Care | Payer: Medicare Other | Source: Ambulatory Visit | Attending: Orthopedic Surgery | Admitting: Orthopedic Surgery

## 2019-10-22 DIAGNOSIS — Z01812 Encounter for preprocedural laboratory examination: Secondary | ICD-10-CM | POA: Diagnosis present

## 2019-10-22 DIAGNOSIS — Z01810 Encounter for preprocedural cardiovascular examination: Secondary | ICD-10-CM | POA: Diagnosis present

## 2019-10-22 DIAGNOSIS — R9431 Abnormal electrocardiogram [ECG] [EKG]: Secondary | ICD-10-CM | POA: Insufficient documentation

## 2019-10-22 LAB — CBC WITH DIFFERENTIAL/PLATELET
Abs Immature Granulocytes: 0.01 10*3/uL (ref 0.00–0.07)
Basophils Absolute: 0 10*3/uL (ref 0.0–0.1)
Basophils Relative: 1 %
Eosinophils Absolute: 0.2 10*3/uL (ref 0.0–0.5)
Eosinophils Relative: 5 %
HCT: 39.6 % (ref 39.0–52.0)
Hemoglobin: 13.2 g/dL (ref 13.0–17.0)
Immature Granulocytes: 0 %
Lymphocytes Relative: 26 %
Lymphs Abs: 1.1 10*3/uL (ref 0.7–4.0)
MCH: 28.8 pg (ref 26.0–34.0)
MCHC: 33.3 g/dL (ref 30.0–36.0)
MCV: 86.5 fL (ref 80.0–100.0)
Monocytes Absolute: 0.4 10*3/uL (ref 0.1–1.0)
Monocytes Relative: 8 %
Neutro Abs: 2.6 10*3/uL (ref 1.7–7.7)
Neutrophils Relative %: 60 %
Platelets: 146 10*3/uL — ABNORMAL LOW (ref 150–400)
RBC: 4.58 MIL/uL (ref 4.22–5.81)
RDW: 12.5 % (ref 11.5–15.5)
WBC: 4.3 10*3/uL (ref 4.0–10.5)
nRBC: 0 % (ref 0.0–0.2)

## 2019-10-22 LAB — COMPREHENSIVE METABOLIC PANEL
ALT: 19 U/L (ref 0–44)
AST: 21 U/L (ref 15–41)
Albumin: 3.9 g/dL (ref 3.5–5.0)
Alkaline Phosphatase: 86 U/L (ref 38–126)
Anion gap: 8 (ref 5–15)
BUN: 10 mg/dL (ref 8–23)
CO2: 27 mmol/L (ref 22–32)
Calcium: 9.1 mg/dL (ref 8.9–10.3)
Chloride: 108 mmol/L (ref 98–111)
Creatinine, Ser: 1.11 mg/dL (ref 0.61–1.24)
GFR calc Af Amer: >60 mL/min (ref >60)
GFR calc non Af Amer: >60 mL/min (ref >60)
Glucose, Bld: 105 mg/dL — ABNORMAL HIGH (ref 70–99)
Potassium: 3.5 mmol/L (ref 3.5–5.1)
Sodium: 143 mmol/L (ref 135–145)
Total Bilirubin: 0.9 mg/dL (ref 0.3–1.2)
Total Protein: 6.2 g/dL — ABNORMAL LOW (ref 6.5–8.1)

## 2019-10-22 LAB — SURGICAL PCR SCREEN
MRSA, PCR: NEGATIVE
Staphylococcus aureus: NEGATIVE

## 2019-10-22 LAB — TYPE AND SCREEN
ABO/RH(D): O POS
Antibody Screen: NEGATIVE

## 2019-10-22 NOTE — Pre-Procedure Instructions (Signed)
Secure chat with Dr Randa Ngo:  Pt having Total hip done on 11-03-19. Pt with htn. EKG done today is abnormal. Pt had stress test done 08-2019 and was normal. Can you review EKG and stress test ( i copy and pasted results from Dr Mariah Milling) Does pt need cardiac clearance?  May be worth while looping [place] in

## 2019-10-22 NOTE — Pre-Procedure Instructions (Signed)
Study Result  Pharmacological myocardial perfusion imaging study with no significant  ischemia Normal wall motion, EF estimated at 58% No EKG changes concerning for ischemia at peak stress or in recovery. Low risk scan   Signed, Dossie Arbour, MD, Ph.D [facility]

## 2019-10-22 NOTE — Pre-Procedure Instructions (Signed)
Yvonne Kendall, MD  Physician  Cardiology  Progress Notes   Signed  Encounter Date: 09/16/2019          Signed     Expand AllCollapse All     Show:Clear all  ManualTemplateCopied Added by:  Yvonne Kendall, MD Hover for details                                                                         Follow-up Outpatient Visit  Date: 09/16/2019  Primary Care Provider:  Mick Sell, MD  [address]  [place] [address]  Chief Complaint: Follow-up elevated troponin and stress test  HPI: Carl Figueroa is a 74 y.o. 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 I last saw him in the office in early December, at which time he was almost back to baseline though he continued to have some fatigue. Previous hematuria that initially led to his hospitalization in November had resolved. He denied chest pain and shortness of breath. Subsequent myocardial perfusion stress test was normal without ischemia or scar.  Today, Carl Figueroa feels well from a heart standpoint, denying chest pain, shortness of breath, palpitations, and lightheadedness. He notes minimal edema in his calves, which has been chronic and stable. He notes occasional numbness and funny feeling in his right forearm and hand when bending his elbow for extended periods. He denies further hematuria and confusion like what brought him to the ED in November.  --------------------------------------------------------------------------------------------------         Past Medical History:   Diagnosis Date   . Hypertension             Past Surgical History:   Procedure Laterality Date   . ankle surgry     . FOOT SURGERY         Active Medications                                                    Electronically signed by Yvonne Kendall, MD at 09/16/2019 1:50 PM       Office Visit on 09/16/2019  Detailed Report  Note shared with patient

## 2019-10-23 ENCOUNTER — Telehealth: Payer: Self-pay | Admitting: Cardiovascular Disease

## 2019-10-23 NOTE — Telephone Encounter (Signed)
   Left message to call back for ongoing preop assessment.  EKG from 10/22/19 reviewed and reveals NSR with rate 72 bpm, with non-specific ST-T wave abnormalities in anterolateral leads seen on previous, however new TWI in inferior leads compared to 09/16/19.  Will route to Dr. Okey Dupre to review.  Beatriz Stallion, PA-C 10/23/19; 3:58 PM

## 2019-10-23 NOTE — Pre-Procedure Instructions (Signed)
Faxed cardiac clearance to Dr Ethelene Hal office and to Dr Neomia Glass office. Notified Kiersten at Dr Rosita Kea office that pt was needing clearance and that I faxed clearance request to Dr Mariah Milling and to Dr Neomia Glass office with fax confirmation received

## 2019-10-23 NOTE — Telephone Encounter (Signed)
Prior EKGs reviewed, including preop EKG yesterday.  Inferolateral T wave inversions were noted on yesterday's EKG, which have been intermittently present on prior tracings as well.  Some of this may reflect repolarization abnormalities.  Myocardial perfusion stress test in 08/2019 was low risk without evidence of ischemia.  If Carl Figueroa remains asymptomatic from a heart standpoint, I think it is reasonable for him to proceed with hip arthroplasty without additional cardiac testing or intervention.  Yvonne Kendall, MD [facility]

## 2019-10-23 NOTE — Telephone Encounter (Signed)
° °  [facility] Pre-operative Risk Assessment    Request for surgical clearance:  1. What type of surgery is being performed? Total hip arthoplasty  2. When is this surgery scheduled? 11/03/19  3. What type of clearance is required (medical clearance vs. Pharmacy clearance to hold med vs. Both)? Both, abnormal EKG  4. Are there any medications that need to be held prior to surgery and how long? Not noted.   5. Practice name and name of physician performing surgery? [facility] at [facility]  6. What is your office phone number    7.   What is your office fax number [phone]  8.   Anesthesia type (None, local, MAC, general) ? Not noted   Marykay Lex 10/23/2019, 3:04 PM  _________________________________________________________________   (provider comments below)

## 2019-10-26 NOTE — Telephone Encounter (Signed)
Additional clearance requested from [facility] pre admit for abnormal EKG see scan on 10/22/19

## 2019-10-26 NOTE — Telephone Encounter (Signed)
Chart reviewed as part of pre-operative protocol coverage. Dr. Laurelyn Sickle' recommendations as below. Spoke with patient. Unable to get 4 mets of activity due to hip issue. He denies any cardiac symptoms.   Given past medical history and time since last visit, based on ACC/AHA guidelines, Carl Figueroa would be at acceptable risk for the planned procedure without further cardiovascular testing.   I will route this recommendation to the requesting party via Epic fax function and remove from pre-op pool.  Please call with questions.  [place], [place] 10/26/2019, 2:37 PM

## 2019-10-28 ENCOUNTER — Encounter
Admission: RE | Admit: 2019-10-28 | Discharge: 2019-10-28 | Disposition: A | Discharge status: Home / Self Care | Payer: Medicare Other | Source: Ambulatory Visit | Attending: Orthopedic Surgery | Admitting: Orthopedic Surgery

## 2019-10-28 DIAGNOSIS — Z01818 Encounter for other preprocedural examination: Secondary | ICD-10-CM | POA: Insufficient documentation

## 2019-10-28 HISTORY — DX: Anxiety disorder, unspecified: F41.9

## 2019-10-28 HISTORY — DX: Unspecified osteoarthritis, unspecified site: M19.90

## 2019-10-28 NOTE — Patient Instructions (Addendum)
Your procedure is scheduled on: 11-03-19 TUESDAY Report to Same Day Surgery 2nd floor medical mall [facility] Entrance-take elevator on left to 2nd floor.  Check in with surgery information desk.) To find out your arrival time please call [phone] between 1PM - 3PM on 11-02-19 MONDAY  Remember: Instructions that are not followed completely may result in serious medical risk, up to and including death, or upon the discretion of your surgeon and anesthesiologist your surgery may need to be rescheduled.    _x___ 1. Do not eat food after midnight the night before your procedure. NO GUM OR CANDY AFTER MIDNIGHT. You may drink clear liquids up to 2 hours before you are scheduled to arrive at the hospital for your procedure.  Do not drink clear liquids within 2 hours of your scheduled arrival to the hospital.  Clear liquids include  --Water or Apple juice without pulp  --Gatorade  --Black Coffee or Clear Tea (No milk, no creamers, do not add anything to the coffee or Tea   ____Ensure clear carbohydrate drink on the way to the hospital for bariatric patients  _X___Ensure clear carbohydrate drink -FINISH DRINK 2 HOURS PRIOR TO ARRIVAL [place]    __x__ 2. No Alcohol for 24 hours before or after surgery.   __x__3. No Smoking or e-cigarettes for 24 prior to surgery.  Do not use any chewable tobacco products for at least 6 hour prior to surgery   ____  4. Bring all medications with you on the day of surgery if instructed.    __x__ 5. Notify your doctor if there is any change in your medical condition     (cold, fever, infections).    x___6. On the morning of surgery brush your teeth with toothpaste and water.  You may rinse your mouth with mouth wash if you wish.  Do not swallow any toothpaste or mouthwash.   Do not wear jewelry, make-up, hairpins, clips or nail polish.  Do not wear lotions, powders, or perfumes.  Do not shave 48 hours prior to surgery. Men may shave face and  neck.  Do not bring valuables to the hospital.    [facility] is not responsible for any belongings or valuables.               Contacts, dentures or bridgework may not be worn into surgery.  Leave your suitcase in the car. After surgery it may be brought to your room.  For patients admitted to the hospital, discharge time is determined by your treatment team.  _  Patients discharged the day of surgery will not be allowed to drive home.  You will need someone to drive you home and stay with you the night of your procedure.    Please read over the following fact sheets that you were given:   [facility] Preparing for Surgery and MRSA Information   _x___ TAKE THE FOLLOWING MEDICATION THE MORNING OF SURGERY WITH A SMALL SIP OF WATER. These include:  1. NORVASC (AMOLODIPINE)  2. ARICEPT  (DONEPIZIL)  3. LIPITOR (ATORVASTATIN)  4. EFFEXOR XR (VENLAFAXINE XR)  5.  6.  ____Fleets enema or Magnesium Citrate as directed.   _x___ Use CHG Soap or sage wipes as directed on instruction sheet   ____ Use inhalers on the day of surgery and bring to hospital day of surgery  ____ Stop Metformin and Janumet 2 days prior to surgery.    ____ Take 1/2 of usual insulin dose the night before surgery  and none on the morning surgery.   _x___ Follow recommendations from Cardiologist, Pulmonologist or PCP regarding stopping Aspirin, Coumadin, Plavix ,Eliquis, Effient, or Pradaxa, and Pletal-STOP YOUR ASPIRIN NOW  X____Stop Anti-inflammatories such as Advil, Aleve, Ibuprofen, Motrin, Naproxen, Naprosyn, Goodies powders or aspirin products NOW-OK to take Tylenol    ____ Stop supplements until after surgery.    ____ Bring C-Pap to the hospital.

## 2019-10-28 NOTE — Pre-Procedure Instructions (Signed)
CARDIAC CLEARANCE IN Epic AND ON CHART

## 2019-10-30 ENCOUNTER — Other Ambulatory Visit
Admission: RE | Admit: 2019-10-30 | Discharge: 2019-10-30 | Disposition: A | Discharge status: Home / Self Care | Payer: Medicare Other | Source: Ambulatory Visit | Attending: Orthopedic Surgery | Admitting: Orthopedic Surgery

## 2019-10-30 DIAGNOSIS — Z01812 Encounter for preprocedural laboratory examination: Secondary | ICD-10-CM | POA: Diagnosis present

## 2019-10-30 DIAGNOSIS — Z20822 Contact with and (suspected) exposure to covid-19: Secondary | ICD-10-CM | POA: Diagnosis not present

## 2019-10-30 LAB — URINALYSIS, ROUTINE W REFLEX MICROSCOPIC
Bacteria, UA: NONE SEEN
Bilirubin Urine: NEGATIVE
Glucose, UA: NEGATIVE mg/dL
Hgb urine dipstick: NEGATIVE
Ketones, ur: NEGATIVE mg/dL
Leukocytes,Ua: NEGATIVE
Nitrite: NEGATIVE
Protein, ur: 30 mg/dL — AB
Specific Gravity, Urine: 1.011 (ref 1.005–1.030)
Squamous Epithelial / HPF: NONE SEEN (ref 0–5)
pH: 6 (ref 5.0–8.0)

## 2019-10-30 LAB — SARS CORONAVIRUS 2 (TAT 6-24 HRS): SARS Coronavirus 2: NEGATIVE

## 2019-11-03 ENCOUNTER — Inpatient Hospital Stay: Payer: Medicare Other

## 2019-11-03 ENCOUNTER — Inpatient Hospital Stay: Payer: Medicare Other | Admitting: Anesthesiology

## 2019-11-03 ENCOUNTER — Encounter: Payer: Self-pay | Admitting: Orthopedic Surgery

## 2019-11-03 ENCOUNTER — Encounter
Admission: RE | Disposition: A | Discharge status: Home Health | Payer: Self-pay | Source: Home / Self Care | Attending: Orthopedic Surgery

## 2019-11-03 ENCOUNTER — Other Ambulatory Visit: Payer: Self-pay

## 2019-11-03 ENCOUNTER — Inpatient Hospital Stay
Admission: RE | Admit: 2019-11-03 | Discharge: 2019-11-06 | DRG: 470 | Disposition: A | Discharge status: Home Health | Payer: Medicare Other | Attending: Orthopedic Surgery | Admitting: Orthopedic Surgery

## 2019-11-03 DIAGNOSIS — Z96642 Presence of left artificial hip joint: Secondary | ICD-10-CM

## 2019-11-03 DIAGNOSIS — Z87891 Personal history of nicotine dependence: Secondary | ICD-10-CM | POA: Diagnosis not present

## 2019-11-03 DIAGNOSIS — Z7982 Long term (current) use of aspirin: Secondary | ICD-10-CM | POA: Diagnosis not present

## 2019-11-03 DIAGNOSIS — F329 Major depressive disorder, single episode, unspecified: Secondary | ICD-10-CM | POA: Diagnosis present

## 2019-11-03 DIAGNOSIS — F4310 Post-traumatic stress disorder, unspecified: Secondary | ICD-10-CM | POA: Diagnosis present

## 2019-11-03 DIAGNOSIS — M1612 Unilateral primary osteoarthritis, left hip: Secondary | ICD-10-CM | POA: Diagnosis present

## 2019-11-03 DIAGNOSIS — F0390 Unspecified dementia without behavioral disturbance: Secondary | ICD-10-CM | POA: Diagnosis present

## 2019-11-03 DIAGNOSIS — E785 Hyperlipidemia, unspecified: Secondary | ICD-10-CM | POA: Diagnosis present

## 2019-11-03 DIAGNOSIS — Z419 Encounter for procedure for purposes other than remedying health state, unspecified: Secondary | ICD-10-CM

## 2019-11-03 DIAGNOSIS — Z8619 Personal history of other infectious and parasitic diseases: Secondary | ICD-10-CM | POA: Diagnosis not present

## 2019-11-03 DIAGNOSIS — F419 Anxiety disorder, unspecified: Secondary | ICD-10-CM | POA: Diagnosis present

## 2019-11-03 DIAGNOSIS — K0889 Other specified disorders of teeth and supporting structures: Secondary | ICD-10-CM | POA: Diagnosis present

## 2019-11-03 DIAGNOSIS — Z885 Allergy status to narcotic agent status: Secondary | ICD-10-CM | POA: Diagnosis not present

## 2019-11-03 DIAGNOSIS — I10 Essential (primary) hypertension: Secondary | ICD-10-CM | POA: Diagnosis present

## 2019-11-03 DIAGNOSIS — G8918 Other acute postprocedural pain: Secondary | ICD-10-CM

## 2019-11-03 DIAGNOSIS — Z79899 Other long term (current) drug therapy: Secondary | ICD-10-CM | POA: Diagnosis not present

## 2019-11-03 DIAGNOSIS — Z791 Long term (current) use of non-steroidal anti-inflammatories (NSAID): Secondary | ICD-10-CM | POA: Diagnosis not present

## 2019-11-03 HISTORY — DX: Presence of left artificial hip joint: Z96.642

## 2019-11-03 HISTORY — PX: TOTAL HIP ARTHROPLASTY: SHX124

## 2019-11-03 LAB — CBC
HCT: 38.5 % — ABNORMAL LOW (ref 39.0–52.0)
Hemoglobin: 12.7 g/dL — ABNORMAL LOW (ref 13.0–17.0)
MCH: 28.5 pg (ref 26.0–34.0)
MCHC: 33 g/dL (ref 30.0–36.0)
MCV: 86.5 fL (ref 80.0–100.0)
Platelets: 151 10*3/uL (ref 150–400)
RBC: 4.45 MIL/uL (ref 4.22–5.81)
RDW: 12.6 % (ref 11.5–15.5)
WBC: 7 10*3/uL (ref 4.0–10.5)
nRBC: 0 % (ref 0.0–0.2)

## 2019-11-03 LAB — CREATININE, SERUM
Creatinine, Ser: 1.08 mg/dL (ref 0.61–1.24)
GFR calc Af Amer: >60 mL/min (ref >60)
GFR calc non Af Amer: >60 mL/min (ref >60)

## 2019-11-03 LAB — ABO/RH: ABO/RH(D): O POS

## 2019-11-03 IMAGING — DX DG HIP (WITH OR WITHOUT PELVIS) 2-3V*L*
2 series · 2 of 2 positions shown · non-contrast
Comparison: CT [DATE]

CLINICAL DATA: Status post hip replacement

EXAM:
DG HIP (WITH OR WITHOUT PELVIS) 2-3V LEFT

[hip ap]
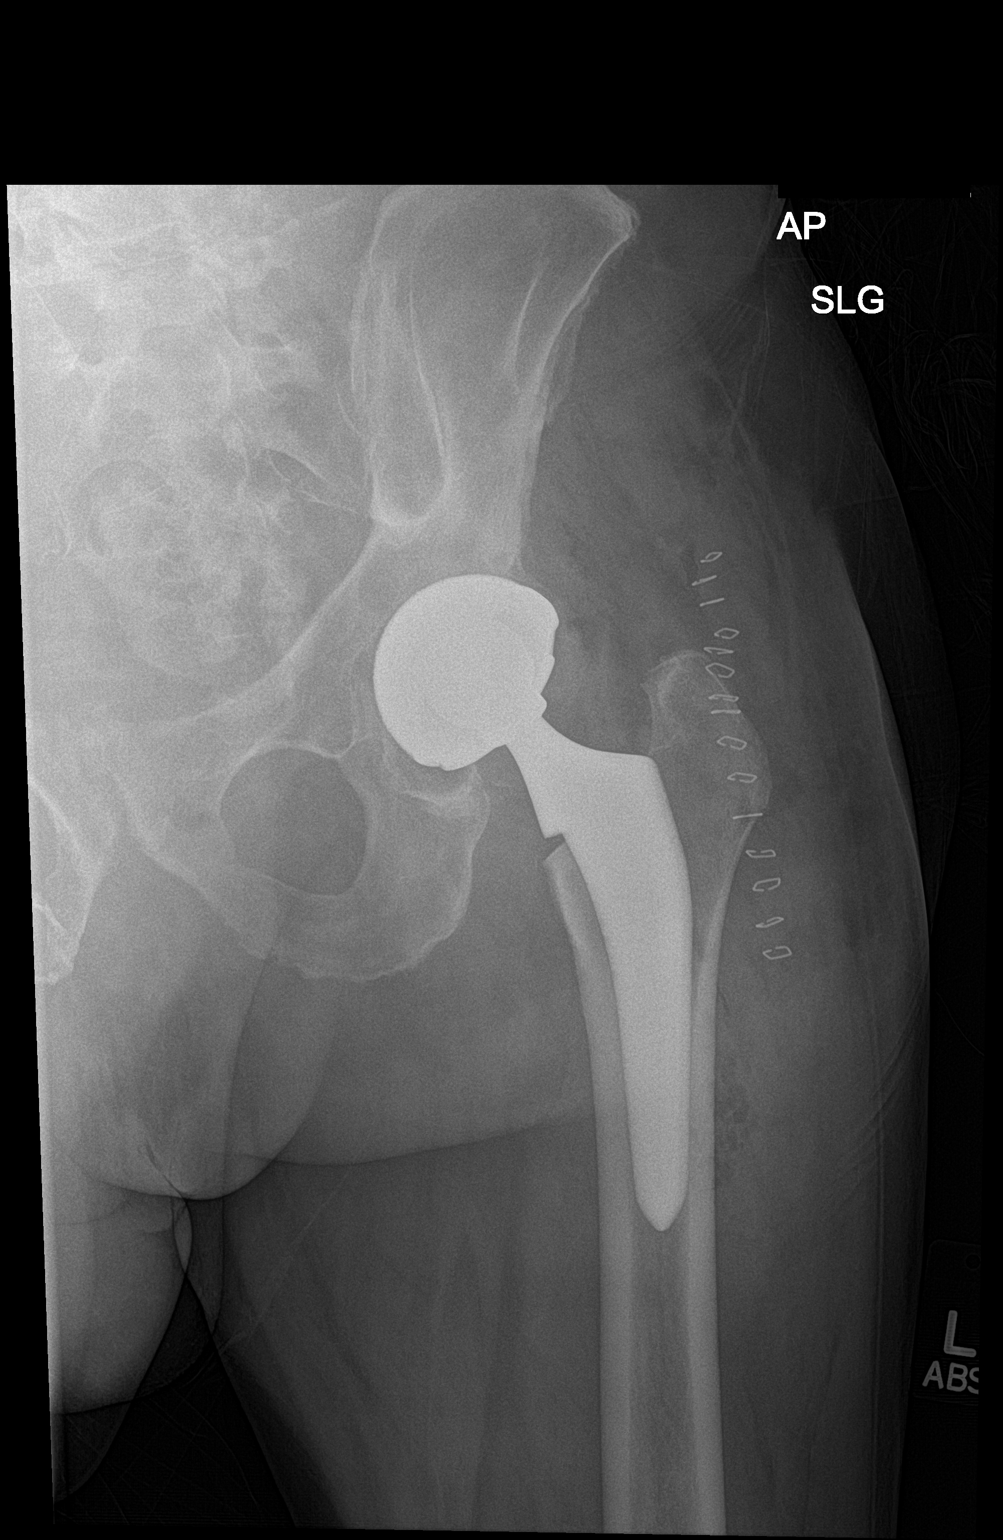

[hip lat]
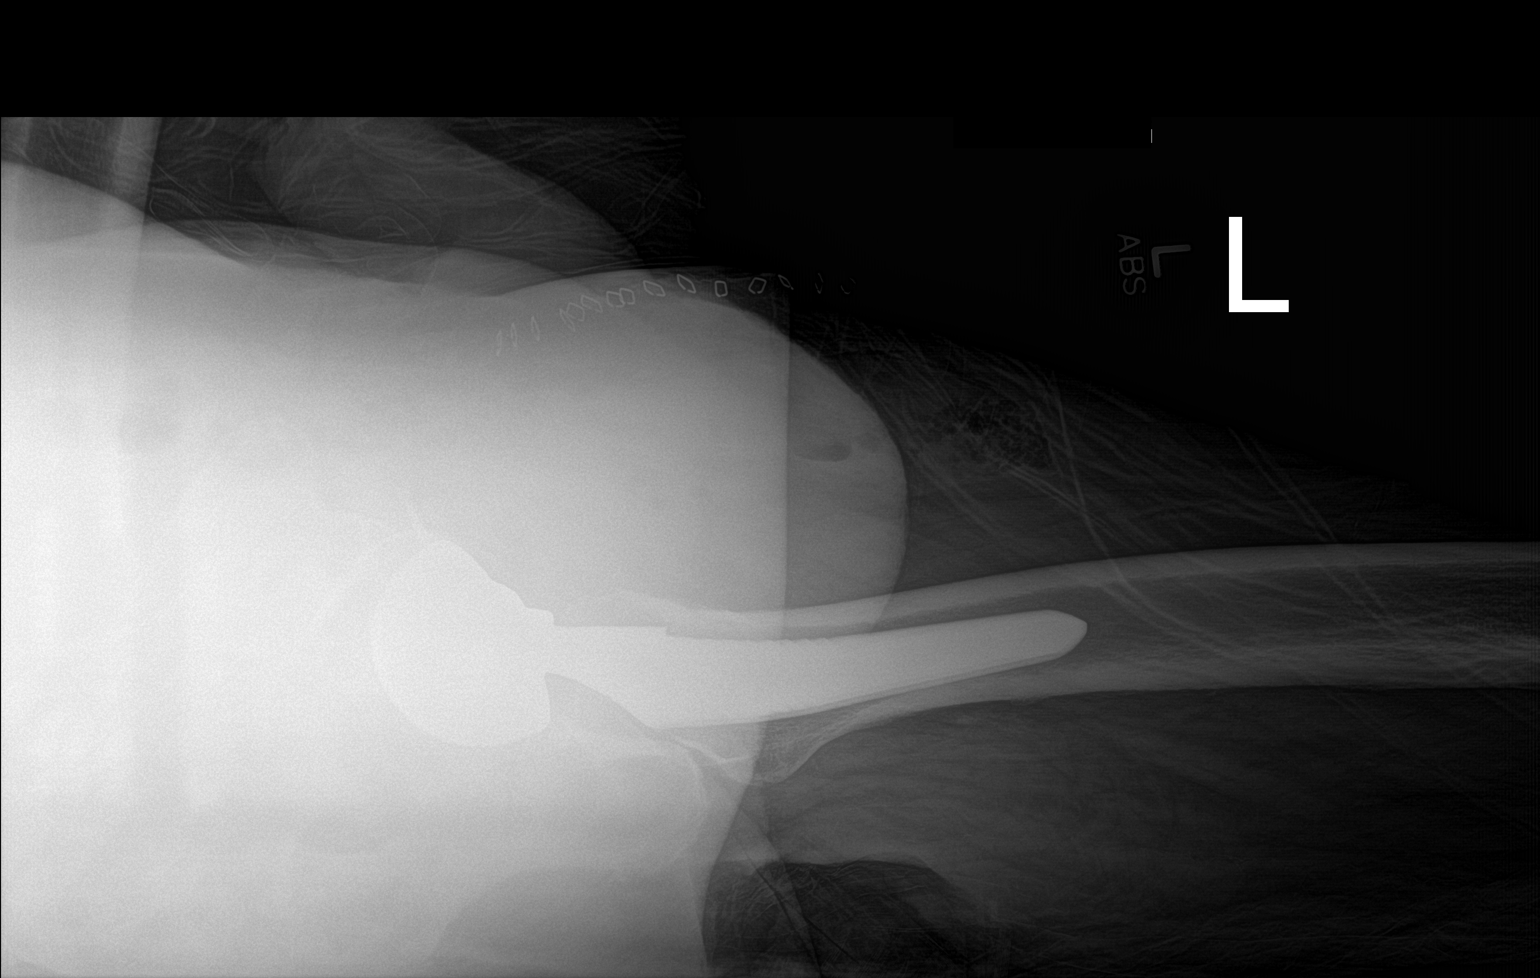

[2 of 2 positions shown; findings below may reference images not displayed]

FINDINGS: Status post left hip replacement with intact hardware and normal
alignment. No fracture is seen. Cutaneous staples over the left hip.
Small amount of gas in the soft tissues consistent with recent
surgery
IMPRESSION: Status post left hip replacement with expected postsurgical changes.

## 2019-11-03 IMAGING — XA DG HIP (WITH PELVIS) OPERATIVE*L*
2 series · 5 of 5 positions shown · non-contrast
Comparison: CT [DATE]

CLINICAL DATA: Left hip replacement

EXAM:
OPERATIVE left HIP (WITH PELVIS IF PERFORMED) 2 VIEWS
TECHNIQUE: Fluoroscopic spot image(s) were submitted for interpretation
post-operatively.

[Series 1: ortho standard · 4 of 9 frames shown (1 of 2)]
[frame 2/9]
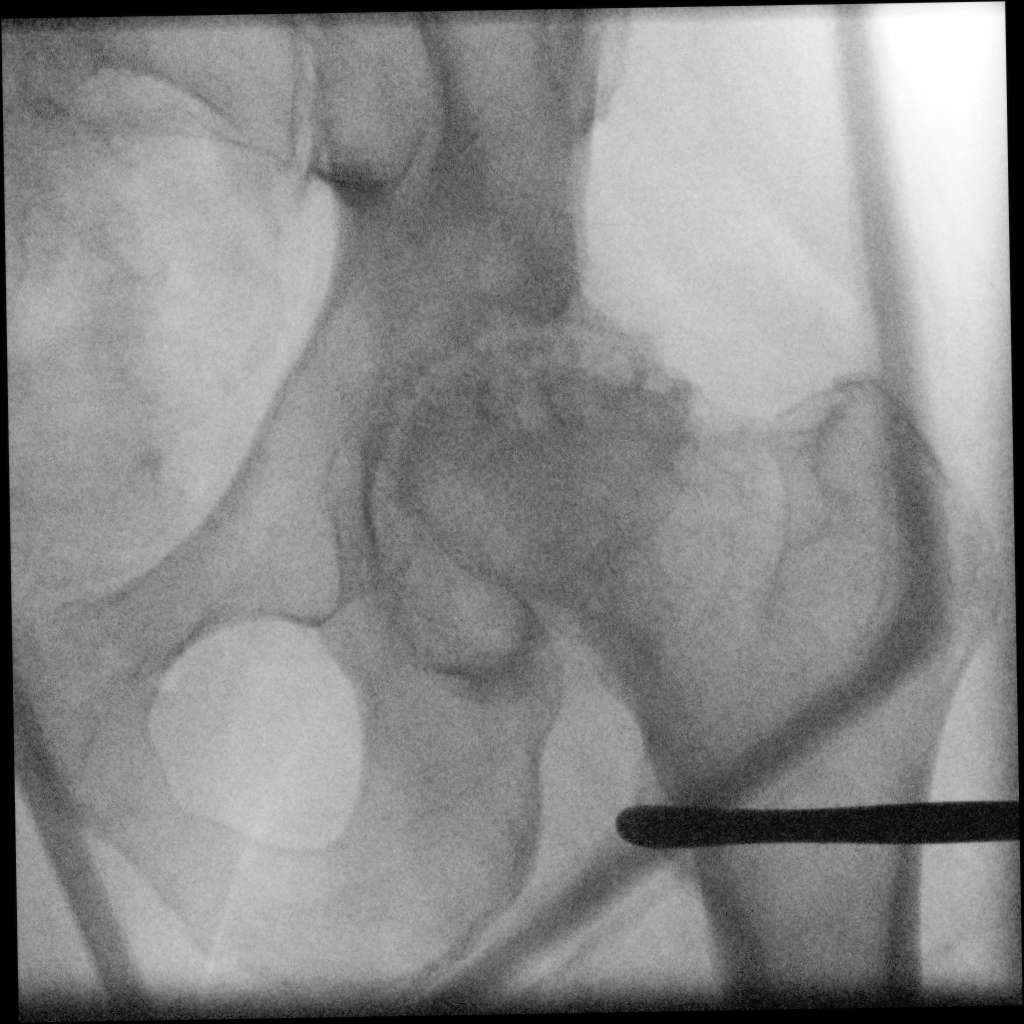
[frame 5/9]
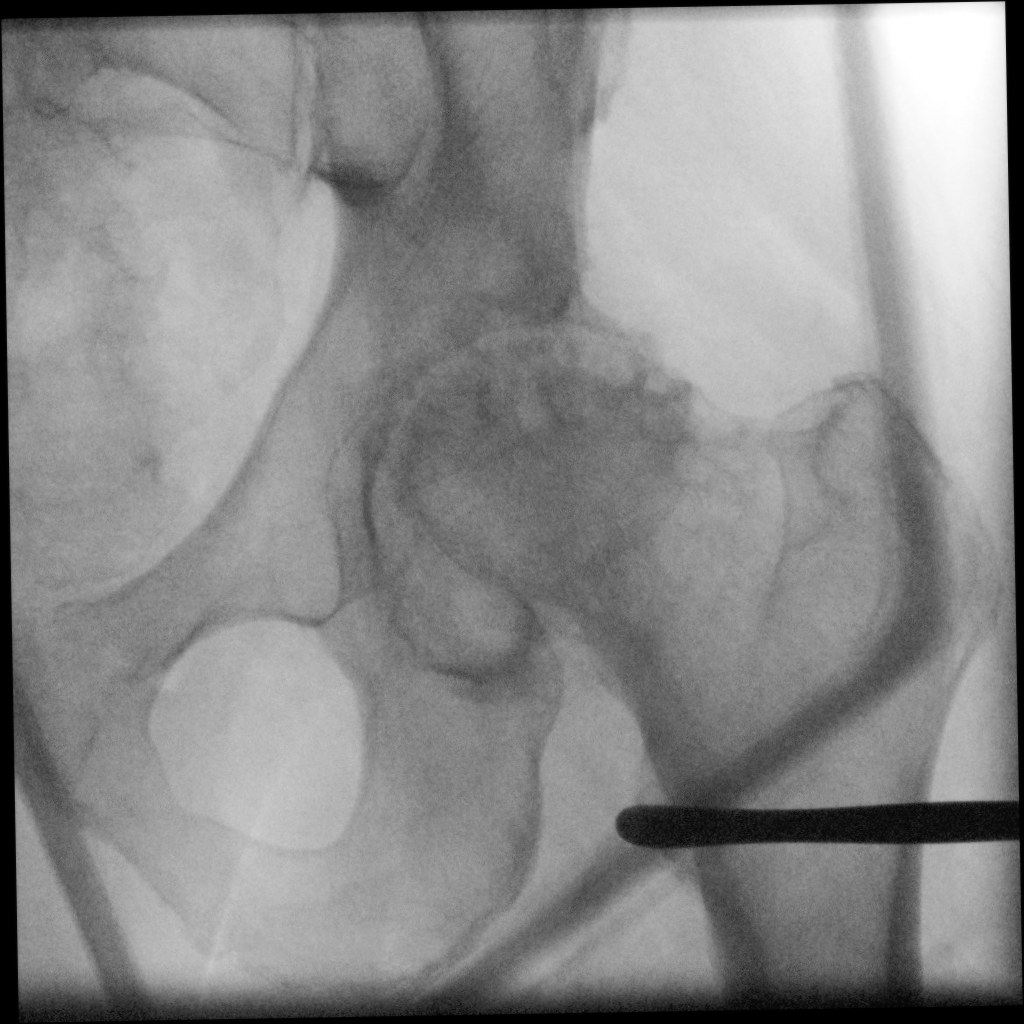
[frame 8/9]
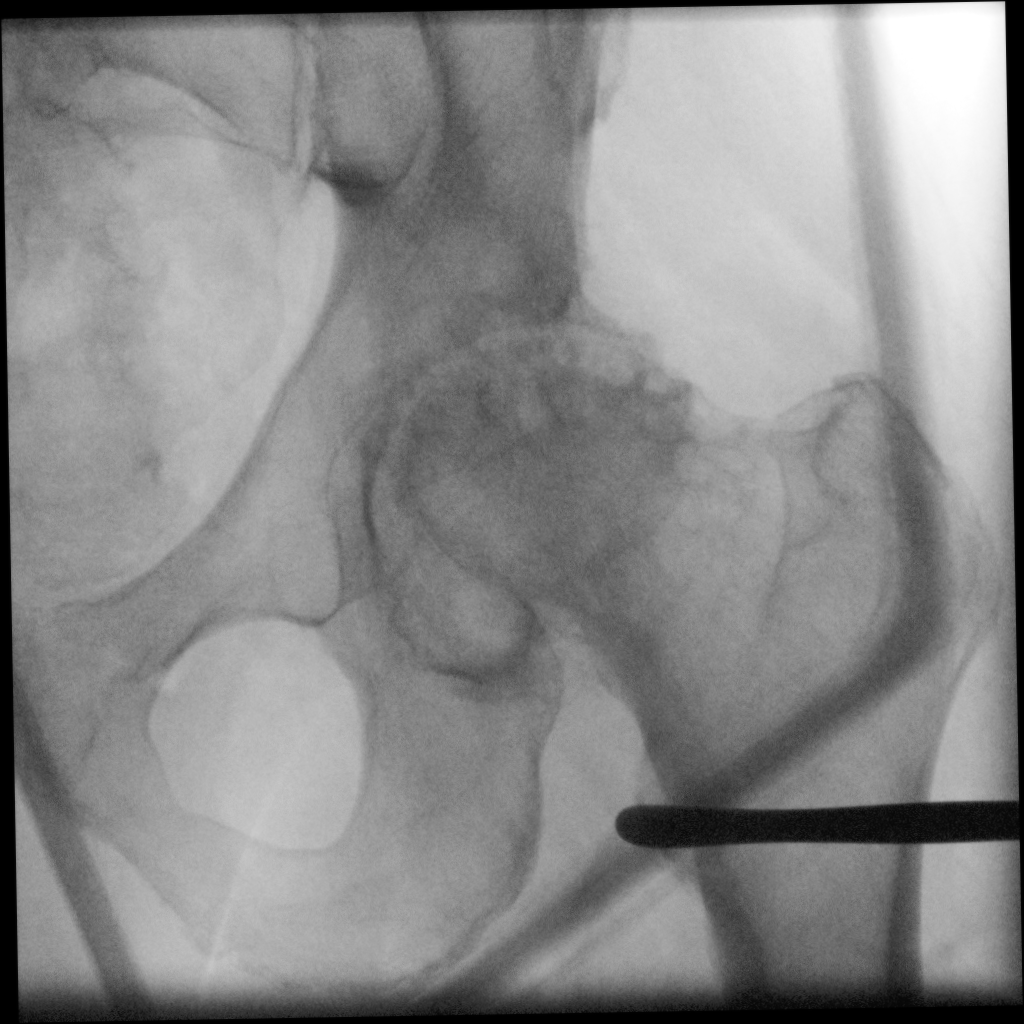
[frame 9/9]
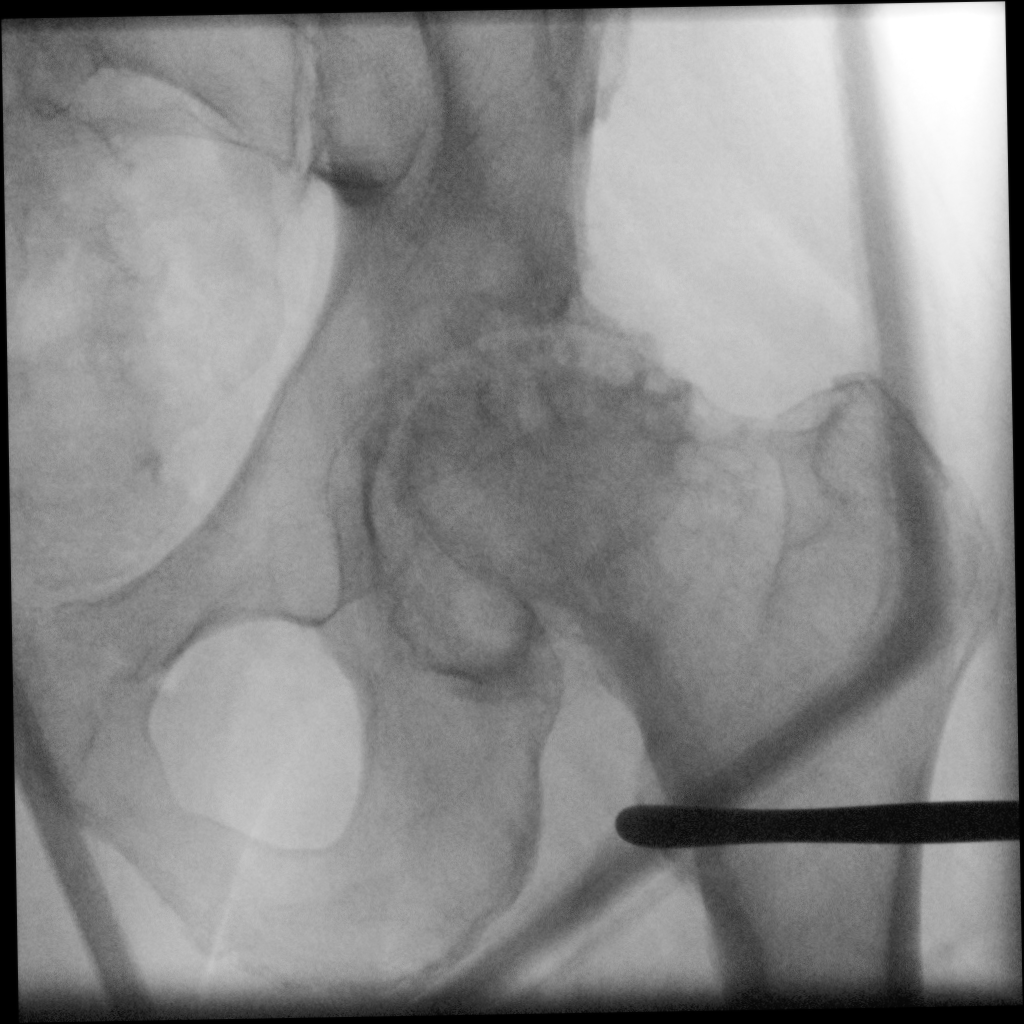

[Series 5: ortho standard · 1 of 1 slices shown (2 of 2)]
[im 1/1]
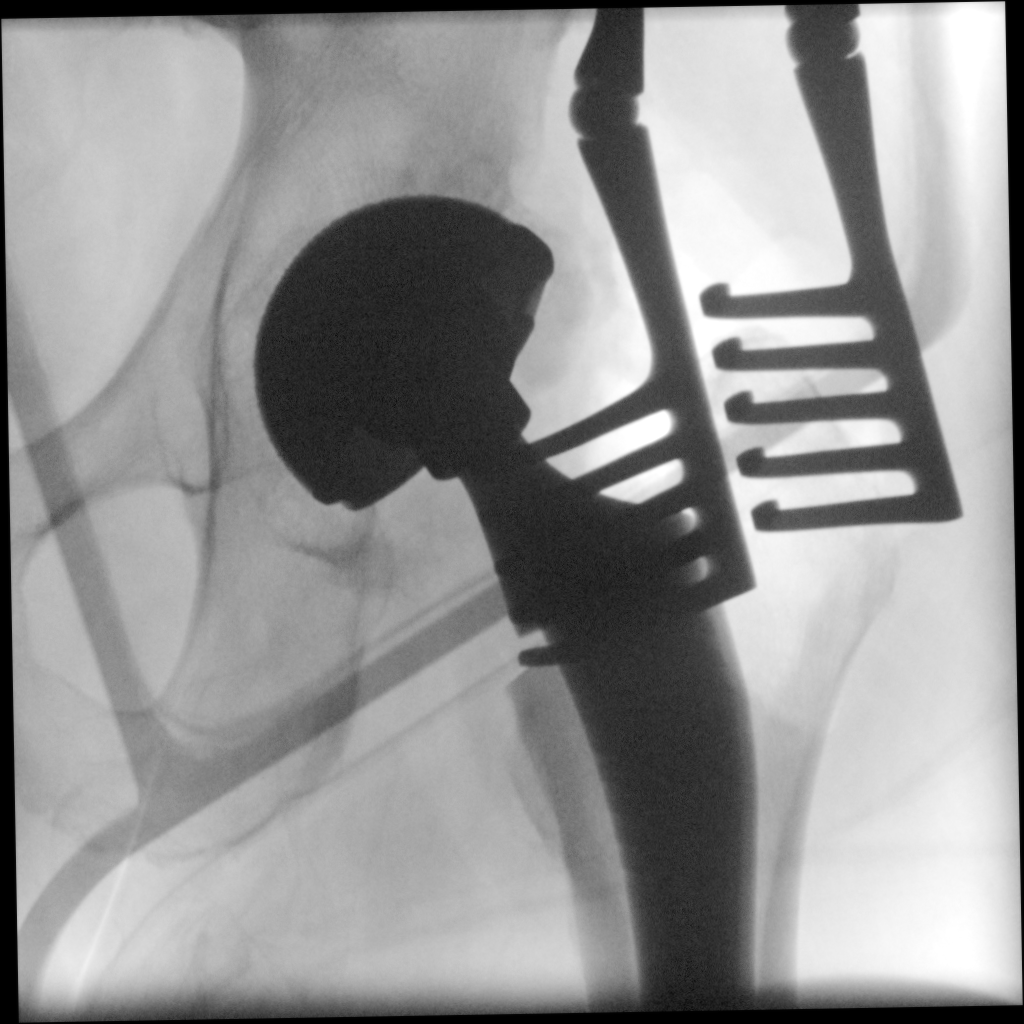

[5 of 5 positions shown; findings below may reference images not displayed]

FINDINGS: Two low resolution intraoperative spot views of the left hip. Total
fluoroscopy time was 20 seconds. Initial image demonstrates severe
arthritis of left hip with obliteration of joint space and
subarticular cysts. Subsequent image demonstrates surgical
retractors over the left hip with placement of a left hip
replacement.
IMPRESSION: Intraoperative fluoroscopic assistance provided during left hip
replacement.

## 2019-11-03 SURGERY — ARTHROPLASTY, HIP, TOTAL, ANTERIOR APPROACH
Anesthesia: Spinal | Site: Hip | Laterality: Left

## 2019-11-03 MED ORDER — MAGNESIUM CITRATE PO SOLN
1.0000 | Freq: Once | ORAL | Status: DC | PRN
  Filled 2019-11-03: qty 296

## 2019-11-03 MED ORDER — TRAMADOL HCL 50 MG PO TABS
50.0000 mg | ORAL_TABLET | Freq: Four times a day (QID) | ORAL | Status: DC
  Administered 2019-11-03 – 2019-11-06 (×10): 50 mg via ORAL
  Filled 2019-11-03 (×11): qty 1

## 2019-11-03 MED ORDER — ENOXAPARIN SODIUM 40 MG/0.4ML ~~LOC~~ SOLN
40.0000 mg | SUBCUTANEOUS | Status: DC
  Administered 2019-11-04 – 2019-11-06 (×3): 40 mg via SUBCUTANEOUS
  Filled 2019-11-03 (×3): qty 0.4

## 2019-11-03 MED ORDER — ASPIRIN EC 81 MG PO TBEC
81.0000 mg | DELAYED_RELEASE_TABLET | Freq: Every day | ORAL | Status: DC
  Administered 2019-11-04 – 2019-11-06 (×3): 81 mg via ORAL
  Filled 2019-11-03 (×6): qty 1

## 2019-11-03 MED ORDER — BUPIVACAINE-EPINEPHRINE (PF) 0.25% -1:200000 IJ SOLN
INTRAMUSCULAR | Status: AC
  Filled 2019-11-03: qty 30

## 2019-11-03 MED ORDER — CEFAZOLIN SODIUM-DEXTROSE 2-4 GM/100ML-% IV SOLN
2.0000 g | Freq: Four times a day (QID) | INTRAVENOUS | Status: AC
  Administered 2019-11-03 (×2): 2 g via INTRAVENOUS
  Filled 2019-11-03 (×2): qty 100

## 2019-11-03 MED ORDER — ACETAMINOPHEN 500 MG PO TABS
1000.0000 mg | ORAL_TABLET | Freq: Four times a day (QID) | ORAL | Status: AC
  Administered 2019-11-03 – 2019-11-04 (×4): 1000 mg via ORAL
  Filled 2019-11-03 (×4): qty 2

## 2019-11-03 MED ORDER — DIPHENHYDRAMINE HCL 12.5 MG/5ML PO ELIX: 12.5000 mg | ORAL_SOLUTION | ORAL | Status: DC | PRN

## 2019-11-03 MED ORDER — MAGNESIUM HYDROXIDE 400 MG/5ML PO SUSP
30.0000 mL | Freq: Every day | ORAL | Status: DC | PRN
  Administered 2019-11-05: 30 mL via ORAL
  Filled 2019-11-03: qty 30

## 2019-11-03 MED ORDER — FENTANYL CITRATE (PF) 100 MCG/2ML IJ SOLN: 25.0000 ug | INTRAMUSCULAR | Status: DC | PRN

## 2019-11-03 MED ORDER — FAMOTIDINE 20 MG PO TABS
ORAL_TABLET | ORAL | Status: AC
  Administered 2019-11-03: 20 mg via ORAL
  Filled 2019-11-03: qty 1

## 2019-11-03 MED ORDER — VENLAFAXINE HCL ER 150 MG PO CP24
300.0000 mg | ORAL_CAPSULE | Freq: Every day | ORAL | Status: DC
  Administered 2019-11-04 – 2019-11-06 (×3): 300 mg via ORAL
  Filled 2019-11-03 (×4): qty 2

## 2019-11-03 MED ORDER — ALUM & MAG HYDROXIDE-SIMETH 200-200-20 MG/5ML PO SUSP: 30.0000 mL | ORAL | Status: DC | PRN

## 2019-11-03 MED ORDER — CHLORHEXIDINE GLUCONATE 4 % EX LIQD: 60.0000 mL | Freq: Once | CUTANEOUS | Status: DC

## 2019-11-03 MED ORDER — OXYCODONE HCL 5 MG PO TABS
10.0000 mg | ORAL_TABLET | ORAL | Status: DC | PRN
  Administered 2019-11-04: 15 mg via ORAL
  Filled 2019-11-03: qty 3

## 2019-11-03 MED ORDER — DONEPEZIL HCL 5 MG PO TABS
10.0000 mg | ORAL_TABLET | ORAL | Status: DC
  Administered 2019-11-04 – 2019-11-06 (×2): 10 mg via ORAL
  Filled 2019-11-03 (×4): qty 2

## 2019-11-03 MED ORDER — PROPOFOL 500 MG/50ML IV EMUL
INTRAVENOUS | Status: AC
  Filled 2019-11-03: qty 50

## 2019-11-03 MED ORDER — MENTHOL 3 MG MT LOZG
1.0000 | LOZENGE | OROMUCOSAL | Status: DC | PRN
  Filled 2019-11-03: qty 9

## 2019-11-03 MED ORDER — LACTATED RINGERS IV SOLN: INTRAVENOUS | Status: DC

## 2019-11-03 MED ORDER — FENTANYL CITRATE (PF) 100 MCG/2ML IJ SOLN
INTRAMUSCULAR | Status: DC | PRN
  Administered 2019-11-03: 25 ug via INTRAVENOUS

## 2019-11-03 MED ORDER — FAMOTIDINE 20 MG PO TABS: 20.0000 mg | ORAL_TABLET | Freq: Once | ORAL | Status: AC

## 2019-11-03 MED ORDER — ACETAMINOPHEN 325 MG PO TABS
325.0000 mg | ORAL_TABLET | Freq: Four times a day (QID) | ORAL | Status: DC | PRN
  Administered 2019-11-06: 650 mg via ORAL
  Filled 2019-11-03: qty 2

## 2019-11-03 MED ORDER — PROPOFOL 500 MG/50ML IV EMUL
INTRAVENOUS | Status: DC | PRN
  Administered 2019-11-03: 125 ug/kg/min via INTRAVENOUS

## 2019-11-03 MED ORDER — PANTOPRAZOLE SODIUM 40 MG PO TBEC
40.0000 mg | DELAYED_RELEASE_TABLET | Freq: Every day | ORAL | Status: DC
  Administered 2019-11-03 – 2019-11-06 (×4): 40 mg via ORAL
  Filled 2019-11-03 (×4): qty 1

## 2019-11-03 MED ORDER — NEOMYCIN-POLYMYXIN B GU 40-200000 IR SOLN
Status: AC
  Filled 2019-11-03: qty 20

## 2019-11-03 MED ORDER — METOCLOPRAMIDE HCL 5 MG/ML IJ SOLN: 5.0000 mg | Freq: Three times a day (TID) | INTRAMUSCULAR | Status: DC | PRN

## 2019-11-03 MED ORDER — ONDANSETRON HCL 4 MG/2ML IJ SOLN
INTRAMUSCULAR | Status: DC | PRN
  Administered 2019-11-03: 4 mg via INTRAVENOUS

## 2019-11-03 MED ORDER — BUPIVACAINE LIPOSOME 1.3 % IJ SUSP
INTRAMUSCULAR | Status: AC
  Filled 2019-11-03: qty 20

## 2019-11-03 MED ORDER — CEFAZOLIN SODIUM-DEXTROSE 2-4 GM/100ML-% IV SOLN
INTRAVENOUS | Status: AC
  Filled 2019-11-03: qty 100

## 2019-11-03 MED ORDER — AMLODIPINE BESYLATE 5 MG PO TABS
5.0000 mg | ORAL_TABLET | ORAL | Status: DC
  Administered 2019-11-05 – 2019-11-06 (×2): 5 mg via ORAL
  Filled 2019-11-03 (×3): qty 1

## 2019-11-03 MED ORDER — BISACODYL 10 MG RE SUPP
10.0000 mg | Freq: Every day | RECTAL | Status: DC | PRN
  Administered 2019-11-06: 10 mg via RECTAL
  Filled 2019-11-03 (×2): qty 1

## 2019-11-03 MED ORDER — BUPIVACAINE HCL (PF) 0.5 % IJ SOLN
INTRAMUSCULAR | Status: DC | PRN
  Administered 2019-11-03: 3 mL

## 2019-11-03 MED ORDER — SODIUM CHLORIDE FLUSH 0.9 % IV SOLN
INTRAVENOUS | Status: AC
  Filled 2019-11-03: qty 40

## 2019-11-03 MED ORDER — ZOLPIDEM TARTRATE 5 MG PO TABS: 5.0000 mg | ORAL_TABLET | Freq: Every evening | ORAL | Status: DC | PRN

## 2019-11-03 MED ORDER — ONDANSETRON HCL 4 MG PO TABS: 4.0000 mg | ORAL_TABLET | Freq: Four times a day (QID) | ORAL | Status: DC | PRN

## 2019-11-03 MED ORDER — CLONAZEPAM 0.5 MG PO TABS
0.2500 mg | ORAL_TABLET | Freq: Every day | ORAL | Status: DC
  Administered 2019-11-04 – 2019-11-05 (×2): 0.25 mg via ORAL
  Filled 2019-11-03 (×5): qty 1

## 2019-11-03 MED ORDER — NEOMYCIN-POLYMYXIN B GU 40-200000 IR SOLN
Status: DC | PRN
  Administered 2019-11-03: 4 mL

## 2019-11-03 MED ORDER — METHOCARBAMOL 500 MG PO TABS
500.0000 mg | ORAL_TABLET | Freq: Four times a day (QID) | ORAL | Status: DC | PRN
  Administered 2019-11-03: 500 mg via ORAL
  Filled 2019-11-03 (×2): qty 1

## 2019-11-03 MED ORDER — GLYCOPYRROLATE 0.2 MG/ML IJ SOLN
INTRAMUSCULAR | Status: DC | PRN
  Administered 2019-11-03: .2 mg via INTRAVENOUS

## 2019-11-03 MED ORDER — CEFAZOLIN SODIUM-DEXTROSE 2-4 GM/100ML-% IV SOLN
2.0000 g | INTRAVENOUS | Status: AC
  Administered 2019-11-03: 14:00:00 2 g via INTRAVENOUS

## 2019-11-03 MED ORDER — LISINOPRIL 20 MG PO TABS
20.0000 mg | ORAL_TABLET | ORAL | Status: DC
  Administered 2019-11-04 – 2019-11-06 (×3): 20 mg via ORAL
  Filled 2019-11-03 (×3): qty 1

## 2019-11-03 MED ORDER — ATORVASTATIN CALCIUM 20 MG PO TABS
40.0000 mg | ORAL_TABLET | ORAL | Status: DC
  Administered 2019-11-04 – 2019-11-06 (×3): 40 mg via ORAL
  Filled 2019-11-03 (×3): qty 2

## 2019-11-03 MED ORDER — SODIUM CHLORIDE 0.9 % IV SOLN
INTRAVENOUS | Status: DC | PRN
  Administered 2019-11-03: 60 mL

## 2019-11-03 MED ORDER — ACETAMINOPHEN 10 MG/ML IV SOLN
INTRAVENOUS | Status: AC
  Filled 2019-11-03: qty 100

## 2019-11-03 MED ORDER — ONDANSETRON HCL 4 MG/2ML IJ SOLN: 4.0000 mg | Freq: Once | INTRAMUSCULAR | Status: DC | PRN

## 2019-11-03 MED ORDER — DOCUSATE SODIUM 100 MG PO CAPS
100.0000 mg | ORAL_CAPSULE | Freq: Two times a day (BID) | ORAL | Status: DC
  Administered 2019-11-03 – 2019-11-06 (×6): 100 mg via ORAL
  Filled 2019-11-03 (×6): qty 1

## 2019-11-03 MED ORDER — BUPIVACAINE-EPINEPHRINE 0.25% -1:200000 IJ SOLN
INTRAMUSCULAR | Status: DC | PRN
  Administered 2019-11-03: 30 mL

## 2019-11-03 MED ORDER — SODIUM CHLORIDE 0.9 % IV SOLN: INTRAVENOUS | Status: DC

## 2019-11-03 MED ORDER — EPHEDRINE SULFATE 50 MG/ML IJ SOLN
INTRAMUSCULAR | Status: DC | PRN
  Administered 2019-11-03 (×2): 10 mg via INTRAVENOUS
  Administered 2019-11-03: 15 mg via INTRAVENOUS

## 2019-11-03 MED ORDER — HYDROMORPHONE HCL 1 MG/ML IJ SOLN: 0.5000 mg | INTRAMUSCULAR | Status: DC | PRN

## 2019-11-03 MED ORDER — METHOCARBAMOL 1000 MG/10ML IJ SOLN
500.0000 mg | Freq: Four times a day (QID) | INTRAVENOUS | Status: DC | PRN
  Filled 2019-11-03: qty 5

## 2019-11-03 MED ORDER — ACETAMINOPHEN 10 MG/ML IV SOLN
INTRAVENOUS | Status: DC | PRN
  Administered 2019-11-03: 1000 mg via INTRAVENOUS

## 2019-11-03 MED ORDER — ONDANSETRON HCL 4 MG/2ML IJ SOLN
4.0000 mg | Freq: Four times a day (QID) | INTRAMUSCULAR | Status: DC | PRN
  Filled 2019-11-03: qty 2

## 2019-11-03 MED ORDER — OXYCODONE HCL 5 MG PO TABS
5.0000 mg | ORAL_TABLET | ORAL | Status: DC | PRN
  Administered 2019-11-06: 5 mg via ORAL
  Filled 2019-11-03: qty 1
  Filled 2019-11-03: qty 2
  Filled 2019-11-03: qty 1

## 2019-11-03 MED ORDER — SODIUM CHLORIDE 0.9 % IV SOLN
INTRAVENOUS | Status: DC | PRN
  Administered 2019-11-03: 50 ug/min via INTRAVENOUS

## 2019-11-03 MED ORDER — PHENOL 1.4 % MT LIQD
1.0000 | OROMUCOSAL | Status: DC | PRN
  Filled 2019-11-03 (×2): qty 177

## 2019-11-03 MED ORDER — FENTANYL CITRATE (PF) 100 MCG/2ML IJ SOLN
INTRAMUSCULAR | Status: AC
  Filled 2019-11-03: qty 2

## 2019-11-03 MED ORDER — METOCLOPRAMIDE HCL 10 MG PO TABS: 5.0000 mg | ORAL_TABLET | Freq: Three times a day (TID) | ORAL | Status: DC | PRN

## 2019-11-03 SURGICAL SUPPLY — 62 items
BLADE SAGITTAL AGGR TOOTH XLG (BLADE) ×2 IMPLANT
BNDG COHESIVE 6X5 TAN STRL LF (GAUZE/BANDAGES/DRESSINGS) ×6 IMPLANT
CANISTER SUCT 1200ML W/VALVE (MISCELLANEOUS) ×2 IMPLANT
CANISTER WOUND CARE 500ML ATS (WOUND CARE) ×1 IMPLANT
CHLORAPREP W/TINT 26 (MISCELLANEOUS) ×2 IMPLANT
COVER BACK TABLE REUSABLE LG (DRAPES) ×2 IMPLANT
COVER LIGHT HANDLE STERIS (MISCELLANEOUS) ×1 IMPLANT
COVER WAND RF STERILE (DRAPES) ×2 IMPLANT
DRAPE 3/4 80X56 (DRAPES) ×6 IMPLANT
DRAPE C-ARM XRAY 36X54 (DRAPES) ×2 IMPLANT
DRAPE INCISE IOBAN 66X60 STRL (DRAPES) ×1 IMPLANT
DRAPE POUCH INSTRU U-SHP 10X18 (DRAPES) ×2 IMPLANT
DRESSING SURGICEL FIBRLLR 1X2 (HEMOSTASIS) ×2 IMPLANT
DRSG OPSITE POSTOP 4X8 (GAUZE/BANDAGES/DRESSINGS) ×3 IMPLANT
DRSG SURGICEL FIBRILLAR 1X2 (HEMOSTASIS) ×4
ELECT BLADE 6.5 EXT (BLADE) ×2 IMPLANT
ELECT REM PT RETURN 9FT ADLT (ELECTROSURGICAL) ×2
ELECTRODE REM PT RTRN 9FT ADLT (ELECTROSURGICAL) ×1 IMPLANT
GLOVE BIOGEL PI IND STRL 9 (GLOVE) ×1 IMPLANT
GLOVE BIOGEL PI INDICATOR 9 (GLOVE) ×1
GLOVE SURG SYN 9.0  PF PI (GLOVE) ×2
GLOVE SURG SYN 9.0 PF PI (GLOVE) ×2 IMPLANT
GOWN SRG 2XL LVL 4 RGLN SLV (GOWNS) ×1 IMPLANT
GOWN STRL NON-REIN 2XL LVL4 (GOWNS) ×1
GOWN STRL REUS W/ TWL LRG LVL3 (GOWN DISPOSABLE) ×1 IMPLANT
GOWN STRL REUS W/TWL LRG LVL3 (GOWN DISPOSABLE) ×1
HEMOVAC 400CC 10FR (MISCELLANEOUS) IMPLANT
HIP FEM HD XL 28 (Head) ×1 IMPLANT
HOLDER FOLEY CATH W/STRAP (MISCELLANEOUS) ×2 IMPLANT
HOOD PEEL AWAY FLYTE STAYCOOL (MISCELLANEOUS) ×3 IMPLANT
KIT PREVENA INCISION MGT 13 (CANNISTER) ×1 IMPLANT
LINER DBL MOB SZ 0 52MM (Liner) ×1 IMPLANT
MAT ABSORB  FLUID 56X50 GRAY (MISCELLANEOUS) ×1
MAT ABSORB FLUID 56X50 GRAY (MISCELLANEOUS) ×1 IMPLANT
NDL SAFETY ECLIPSE 18X1.5 (NEEDLE) ×1 IMPLANT
NDL SPNL 20GX3.5 QUINCKE YW (NEEDLE) ×2 IMPLANT
NEEDLE HYPO 18GX1.5 SHARP (NEEDLE) ×1
NEEDLE SPNL 20GX3.5 QUINCKE YW (NEEDLE) ×4 IMPLANT
NS IRRIG 1000ML POUR BTL (IV SOLUTION) ×2 IMPLANT
PACK HIP COMPR (MISCELLANEOUS) ×2 IMPLANT
PENCIL SMOKE EVACUATOR COATED (MISCELLANEOUS) ×1 IMPLANT
SCALPEL PROTECTED #10 DISP (BLADE) ×4 IMPLANT
SHELL ACETABULAR SZ 52 DM (Shell) ×1 IMPLANT
SOL PREP PVP 2OZ (MISCELLANEOUS)
SOLUTION PREP PVP 2OZ (MISCELLANEOUS) ×1 IMPLANT
SPONGE DRAIN TRACH 4X4 STRL 2S (GAUZE/BANDAGES/DRESSINGS) ×1 IMPLANT
STAPLER SKIN PROX 35W (STAPLE) ×2 IMPLANT
STEM FEMORAL 4 STD COLLARED (Stem) ×1 IMPLANT
STRAP SAFETY 5IN WIDE (MISCELLANEOUS) ×2 IMPLANT
SUT DVC 2 QUILL PDO  T11 36X36 (SUTURE) ×1
SUT DVC 2 QUILL PDO T11 36X36 (SUTURE) ×1 IMPLANT
SUT SILK 0 (SUTURE) ×1
SUT SILK 0 30XBRD TIE 6 (SUTURE) ×1 IMPLANT
SUT V-LOC 90 ABS DVC 3-0 CL (SUTURE) ×2 IMPLANT
SUT VIC AB 1 CT1 36 (SUTURE) ×2 IMPLANT
SYR 20ML LL LF (SYRINGE) ×2 IMPLANT
SYR 30ML LL (SYRINGE) ×2 IMPLANT
SYR 50ML LL SCALE MARK (SYRINGE) ×4 IMPLANT
SYR BULB IRRIG 60ML STRL (SYRINGE) ×2 IMPLANT
TAPE MICROFOAM 4IN (TAPE) ×2 IMPLANT
TOWEL OR 17X26 4PK STRL BLUE (TOWEL DISPOSABLE) ×2 IMPLANT
TRAY FOLEY MTR SLVR 16FR STAT (SET/KITS/TRAYS/PACK) ×2 IMPLANT

## 2019-11-03 NOTE — H&P (Signed)
Chief Complaint  Patient presents with  . Left hip osteoarthritis for THA  Carl Figueroa is a 74 y.o. male who presents today for evaluation of severe left hip pain. His hip pains been present for years, however the last years been using a cane and a walker. He describes severe pain in his groin, thigh, buttocks all the way down to the anterior aspect of the knee and occasionally down the lower leg. No burning numbness or tingling. Pain is most severe in the groin and he has a hard time with any type of hip flexion activities such as putting his socks and shoes on, going up steps, getting in and out of the low vehicle and picking things up off the floor. His pain is moderate to severe. Occasionally takes tramadol 1 tablet daily. No history of blood clots.  Past Medical History: Past Medical History:  Diagnosis Date  . Depression  . Hyperlipidemia  . Hypertension  . Osteoarthritis  . PTSD (post-traumatic stress disorder)   Past Surgical History: Past Surgical History:  Procedure Laterality Date  . BACK SURGERY  . HERNIA REPAIR   Past Family History: Family History  Problem Relation Age of Onset  . Dementia Mother  . No Known Problems Father  . Hip dysplasia Sister  . No Known Problems Sister   Medications: Current Outpatient Medications Ordered in Epic  Medication Sig Dispense Refill  . acetaminophen (TYLENOL) 325 MG tablet Take 650 mg by mouth once daily as needed  . amLODIPine (NORVASC) 5 MG tablet Take 5 mg by mouth once daily  . aspirin 81 MG EC tablet Take 81 mg by mouth once daily  . atorvastatin (LIPITOR) 40 MG tablet Take 40 mg by mouth once daily  . baclofen (LIORESAL) 10 MG tablet Take 10 mg by mouth nightly  . clonazePAM (KLONOPIN) 0.5 MG tablet Take 0.5 mg by mouth 2 (two) times daily as needed for Anxiety  . diclofenac (VOLTAREN) 1 % topical gel Apply topically 2 (two) times daily  . donepeziL (ARICEPT) 5 MG tablet Take 1 tablet (5 mg total) by mouth nightly 30  tablet 3  . lidocaine (XYLOCAINE) 4 % (40 mg/mL) external solution Apply topically 2 (two) times daily  . naproxen (NAPROSYN) 500 MG tablet Take 1,000 mg by mouth once daily  . sildenafiL (VIAGRA) 100 MG tablet Take 100 mg by mouth as needed  . venlafaxine (EFFEXOR-XR) 150 MG XR capsule Take 150 mg by mouth once daily   No current Epic-ordered facility-administered medications on file.   Allergies: Allergies  Allergen Reactions  . Morphine Nausea    Review of Systems:  A comprehensive 14 point ROS was performed, reviewed by me today, and the pertinent orthopaedic findings are documented in the HPI.  Exam: BP 128/84  Wt 88.9 kg (196 lb)  BMI 30.70 kg/m  General:  Well developed, well nourished, no apparent distress, normal affect, slow severely antalgic gait with a cane HEENT: Head normocephalic, atraumatic, PERRL.   Abdomen: Soft, non tender, non distended, Bowel sounds present.  Heart: Examination of the heart reveals regular, rate, and rhythm. There is no murmur noted on ascultation. There is a normal apical pulse.  Lungs: Lungs are clear to auscultation. There is no wheeze, rhonchi, or crackles. There is normal expansion of bilateral chest walls.   Lumbar Spine: Examination of the lumbar spine reveals no bony abnormality, no edema, and no ecchymosis. No spinous process, paravertebral muscle tenderness. No sacral tenderness or SI joint tenderness.  Left Lower  Extremities: Examination of the left lower extremities reveals no bony abnormality, no edema, and no ecchymosis. The patient has severely limited left hip internal rotation. The patient has no anterior hip joint tenderness. The patient has a negative axial load test at the hip joint. The patient has no tenderness going from hip flexion into extension. There is severe discomfort with left hip internal rotation reproducing groin, buttocks and lateral hip pain. Moderately tender along the left hip trochanteric bursa.. The  patient has a negative [name] test bilaterally. There is normal skin warmth. There is normal capillary refill bilaterally.   Neurologic: The patient has a negative straight leg raise. The patient has normal muscle strength testing for the quadriceps, calves, ankle dorsiflexion, ankle plantarflexion, and extensor hallicus longus. The patient has sensation that is intact to light touch. The deep tendon reflexes are normal at the patella and achilles. No clonus is noted.   AP pelvis and lateral view of the left hip previously Impression: Patient has moderate degenerative changes of the right hip joint with subchondral sclerosing and superior and central joint space narrowing. Pelvic rings are intact. Patient has severe degenerative changes left hip joint with complete loss of joint space throughout the central and superior acetabulum with subchondral cyst formation noted throughout the femoral head as well as the acetabulum. There is no evidence of acute bony abnormality. Moderate spurring throughout inferior acetabulum.  Impression: Primary osteoarthritis of left hip [M16.12] Primary osteoarthritis of left hip (primary encounter diagnosis)  Plan:  1. Risks, benefits, complications of a left total hip arthroplasty have been discussed with the patient. Plan total hip replacement, left, today

## 2019-11-03 NOTE — Transfer of Care (Signed)
Immediate Anesthesia Transfer of Care Note  Patient: Carl Figueroa  Procedure(s) Performed: TOTAL HIP ARTHROPLASTY ANTERIOR APPROACH (Left Hip)  Patient Location: PACU  Anesthesia Type:Spinal  Level of Consciousness: sedated  Airway & Oxygen Therapy: Patient Spontanous Breathing and Patient connected to face mask oxygen  Post-op Assessment: Report given to RN and Post -op Vital signs reviewed and stable  Post vital signs: Reviewed and stable  Last Vitals:  Vitals Value Taken Time  BP    Temp    Pulse    Resp 16 11/03/19 1506  SpO2    Vitals shown include unvalidated device data.  Last Pain:  Vitals:   11/03/19 1052  TempSrc: Tympanic  PainSc: 0-No pain         Complications: No apparent anesthesia complications

## 2019-11-03 NOTE — TOC Progression Note (Signed)
Transition of Care [facility]) - Progression Note    Patient Details  Name: Carl Figueroa MRN: [id number] Date of Birth: 1946/06/22  Transition of Care [facility]) CM/SW Contact  Barrie Dunker, RN Phone Number: 11/03/2019, 3:21 PM  Clinical Narrative:    Requested the price of Lovenox, will notify the patient once obtained        Expected Discharge Plan and Services                                                 Social Determinants of Health (SDOH) Interventions    Readmission Risk Interventions No flowsheet data found.

## 2019-11-03 NOTE — Anesthesia Preprocedure Evaluation (Signed)
Anesthesia Evaluation  Patient identified by MRN, date of birth, ID band Patient awake    Reviewed: Allergy & Precautions, H&P , NPO status , Patient's Chart, lab work & pertinent test results, reviewed documented beta blocker date and time   History of Anesthesia Complications Negative for: history of anesthetic complications  Airway Mallampati: II  TM Distance: >3 FB Neck ROM: full    Dental  (+) Chipped, Missing, Dental Advidsory Given, Poor Dentition   Pulmonary neg pulmonary ROS, former smoker,    Pulmonary exam normal breath sounds clear to auscultation       Cardiovascular Exercise Tolerance: Good hypertension, (-) angina(-) Past MI and (-) Cardiac Stents Normal cardiovascular exam(-) dysrhythmias + Valvular Problems/Murmurs  Rhythm:regular Rate:Normal     Neuro/Psych PSYCHIATRIC DISORDERS Anxiety negative neurological ROS     GI/Hepatic negative GI ROS, Neg liver ROS,   Endo/Other  negative endocrine ROS  Renal/GU negative Renal ROS  negative genitourinary   Musculoskeletal   Abdominal   Peds  Hematology negative hematology ROS (+)   Anesthesia Other Findings Past Medical History: No date: Anxiety No date: Arthritis No date: Hypertension   Reproductive/Obstetrics negative OB ROS                             Anesthesia Physical Anesthesia Plan  ASA: II  Anesthesia Plan: Spinal   Post-op Pain Management:    Induction:   PONV Risk Score and Plan: Propofol infusion and TIVA  Airway Management Planned: Natural Airway and Simple Face Mask  Additional Equipment:   Intra-op Plan:   Post-operative Plan:   Informed Consent: I have reviewed the patients History and Physical, chart, labs and discussed the procedure including the risks, benefits and alternatives for the proposed anesthesia with the patient or authorized representative who has indicated his/her understanding and  acceptance.     Dental Advisory Given  Plan Discussed with: Anesthesiologist, CRNA and Surgeon  Anesthesia Plan Comments:         Anesthesia Quick Evaluation

## 2019-11-03 NOTE — Anesthesia Procedure Notes (Signed)
Spinal  Start time: 11/03/2019 1:24 PM End time: 11/03/2019 1:42 PM Staffing Performed: resident/CRNA  Preanesthetic Checklist Completed: patient identified, IV checked, site marked, risks and benefits discussed, surgical consent, monitors and equipment checked, pre-op evaluation and timeout performed Spinal Block Patient position: sitting Prep: ChloraPrep Patient monitoring: heart rate, continuous pulse ox and blood pressure Location: L3-4 Injection technique: single-shot Needle Needle type: Pencan  Needle gauge: 24 G Needle length: 10 cm Assessment Sensory level: T8

## 2019-11-03 NOTE — Op Note (Signed)
11/03/2019  3:02 PM  PATIENT:  Carl Figueroa  74 y.o. male  PRE-OPERATIVE DIAGNOSIS:  Primary osteoarthritis of left hip  POST-OPERATIVE DIAGNOSIS:  Primary osteoarthritis of left hip  PROCEDURE:  Procedure(s): TOTAL HIP ARTHROPLASTY ANTERIOR APPROACH (Left)  SURGEON: Leitha Schuller, MD  ASSISTANTS: None  ANESTHESIA:   spinal  EBL:  Total I/O In: 650 [I.V.:650] Out: 480 [Urine:80; Blood:400]  BLOOD ADMINISTERED:none  DRAINS: Incisional wound VAC   LOCAL MEDICATIONS USED:  MARCAINE    and OTHER Exparel  SPECIMEN:  Source of Specimen:  Left femoral head  DISPOSITION OF SPECIMEN:  PATHOLOGY  COUNTS:  YES  TOURNIQUET:  * No tourniquets in log *  IMPLANTS: Medacta AMIS 4 standard stem with 52 mm mPACT DM cup and liner and XL 28 mm metal head  DICTATION: .Dragon Dictation   The patient was brought to the operating room and after spinal anesthesia was obtained patient was placed on the operative table with the ipsilateral foot into the Medacta attachment, contralateral leg on a well-padded table. C-arm was brought in and preop template x-ray taken. After prepping and draping in usual sterile fashion appropriate patient identification and timeout procedures were completed. Anterior approach to the hip was obtained and centered over the greater trochanter and TFL muscle. The subcutaneous tissue was incised hemostasis being achieved by electrocautery. TFL fascia was incised and the muscle retracted laterally deep retractor placed. The lateral femoral circumflex vessels were identified and ligated. The anterior capsule was exposed and a capsulotomy performed. The neck was identified and a femoral neck cut carried out with a saw. The head was removed without difficulty and showed collapsed femoral head and acetabulum. Reaming was carried out to 52 mm and a 52 mm cup trial gave appropriate tightness to the acetabular component a 52 DM cup was impacted into position. The leg was then  externally rotated and ischiofemoral and pubofemoral releases carried out. The femur was sequentially broached to a size 4, size 4 standard with S then L heads trials were placed and the final components chosen. The 4 standard stem was inserted along with a XL metal 28 mm head and 52 mm liner. The hip was reduced and was stable the wound was thoroughly irrigated with fibrillar placed along the posterior capsule and medial neck. The deep fascia ws closed using a heavy Quill after infiltration of 30 cc of quarter percent Sensorcaine with epinephrine along with Exparel throughout the case .3-0 V-loc to close the skin with skin staples. Xeroform and honeycomb dressing applied e patient was sent to recovery in stable condition.   PLAN OF CARE: Admit to inpatient

## 2019-11-03 NOTE — Progress Notes (Signed)
Pt very confused  Talking about snakes  Not making sense when he speaks

## 2019-11-03 NOTE — Progress Notes (Signed)
Left thigh incision site slight swollen but not hard to touch

## 2019-11-03 NOTE — Progress Notes (Signed)
Much better  Alert and oriented

## 2019-11-04 LAB — CBC
HCT: 32.6 % — ABNORMAL LOW (ref 39.0–52.0)
Hemoglobin: 10.8 g/dL — ABNORMAL LOW (ref 13.0–17.0)
MCH: 28.4 pg (ref 26.0–34.0)
MCHC: 33.1 g/dL (ref 30.0–36.0)
MCV: 85.8 fL (ref 80.0–100.0)
Platelets: 140 10*3/uL — ABNORMAL LOW (ref 150–400)
RBC: 3.8 MIL/uL — ABNORMAL LOW (ref 4.22–5.81)
RDW: 12.6 % (ref 11.5–15.5)
WBC: 6.8 10*3/uL (ref 4.0–10.5)
nRBC: 0 % (ref 0.0–0.2)

## 2019-11-04 LAB — BASIC METABOLIC PANEL
Anion gap: 6 (ref 5–15)
BUN: 13 mg/dL (ref 8–23)
CO2: 27 mmol/L (ref 22–32)
Calcium: 8.6 mg/dL — ABNORMAL LOW (ref 8.9–10.3)
Chloride: 111 mmol/L (ref 98–111)
Creatinine, Ser: 1.27 mg/dL — ABNORMAL HIGH (ref 0.61–1.24)
GFR calc Af Amer: >60 mL/min (ref >60)
GFR calc non Af Amer: 56 mL/min — ABNORMAL LOW (ref >60)
Glucose, Bld: 133 mg/dL — ABNORMAL HIGH (ref 70–99)
Potassium: 3.6 mmol/L (ref 3.5–5.1)
Sodium: 144 mmol/L (ref 135–145)

## 2019-11-04 MED ORDER — ENOXAPARIN SODIUM 40 MG/0.4ML ~~LOC~~ SOLN: 40.0000 mg | SUBCUTANEOUS | 0 refills | Status: DC

## 2019-11-04 MED ORDER — OXYCODONE HCL 5 MG PO TABS: 5.0000 mg | ORAL_TABLET | ORAL | 0 refills | Status: DC | PRN

## 2019-11-04 MED ORDER — METHOCARBAMOL 500 MG PO TABS: 500.0000 mg | ORAL_TABLET | Freq: Four times a day (QID) | ORAL | 0 refills | Status: DC | PRN

## 2019-11-04 MED ORDER — DOCUSATE SODIUM 100 MG PO CAPS: 100.0000 mg | ORAL_CAPSULE | Freq: Two times a day (BID) | ORAL | 0 refills | Status: DC

## 2019-11-04 NOTE — Evaluation (Signed)
Occupational Therapy Evaluation Patient Details Name: Carl Figueroa MRN: [id number] DOB: June 11, 1946 Today's Date: 11/04/2019    History of Present Illness Pt admitted for L THR. HIstory includes depression, HLD, OA, and PTSD.    Clinical Impression   Patient approached for OT evaluation this date.  Patient presents with confusion: oriented to self and situation.  Patient had difficulty maintaining attention to task or appropriate conversation, believing people in television were conversing with him or would close eyes and say he was asleep and dreaming.  Prior to surgery, patient was MOD I for BADLs and functional mobility using SPC.  Patient stated he has 7.5/10 pain in operative hip; conferred with nursing for appropriate medication.  Patient initially reluctant to participate but did well with encouragement.  BP at 118/58 and 85HR.  Patient able to move to EOB with extra time with MIN A for L LE.  Tolerated sitting at EOB to perform simple grooming tasks.  Performed sit<>stand from EOB with MOD A.  Required mod/max cuing for appropriate body mechanics, use of RW, sequencing and self pacing.  Patient would benefit from continued skilled occupational therapy treatment focusing on performance components outlined below.  Based on today's performance, recommending SNF as follow up recommendation.    Follow Up Recommendations  SNF    Equipment Recommendations  Other (comment)(defer to next level of care)    Recommendations for Other Services       Precautions / Restrictions Precautions Precautions: Anterior Hip;Fall Precaution Booklet Issued: No Restrictions Weight Bearing Restrictions: Yes LLE Weight Bearing: Weight bearing as tolerated      Mobility Bed Mobility Overal bed mobility: Needs Assistance Bed Mobility: Supine to Sit;Sit to Supine     Supine to sit: Min assist;HOB elevated Sit to supine: Min assist   General bed mobility comments: Performed well with extra time and  use of bedrails.  Requires assistance for managing operative LE.  Transfers Overall transfer level: Needs assistance Equipment used: Rolling walker (2 wheeled) Transfers: Sit to/from Stand Sit to Stand: Mod assist         General transfer comment: Required verbal/visual/tactile cues on body mechanics, self pacing, sequencing and hand placement for safe sit<>stand.    Balance Overall balance assessment: Needs assistance Sitting-balance support: Feet supported;No upper extremity supported Sitting balance-Leahy Scale: Good                                ADL either performed or assessed with clinical judgement   ADL Overall ADL's : Needs assistance/impaired     Grooming: Wash/dry face;Wash/dry hands;Sitting;Set up;Supervision/safety Grooming Details (indicate cue type and reason): Completed simple grooming while seated at EOB               Lower Body Dressing Details (indicate cue type and reason): Patient declined to attempt to take off his sock stating it will cause too much pain. Toilet Transfer: Moderate assistance;RW             General ADL Comments: Patient with limited participation in ADLs secondary to confusion this date.     Vision Patient Visual Report: No change from baseline Additional Comments: Reports no change.     Perception     Praxis      Pertinent Vitals/Pain Pain Assessment: 0-10 Pain Score: 7 (7.5) Pain Location: L hip/surgical site Pain Descriptors / Indicators: Operative site guarding;Aching;Discomfort Pain Intervention(s): Limited activity within patient's tolerance;Monitored during session;Patient requesting pain meds-RN  notified;Repositioned     Hand Dominance Right   Extremity/Trunk Assessment Upper Extremity Assessment Upper Extremity Assessment: Overall WFL for tasks assessed   Lower Extremity Assessment Lower Extremity Assessment: Defer to PT evaluation   Cervical / Trunk Assessment Cervical / Trunk  Assessment: Normal   Communication Communication Communication: No difficulties;Other (comment)(Patient is currently confused.)   Cognition Arousal/Alertness: Awake/alert Behavior During Therapy: WFL for tasks assessed/performed Overall Cognitive Status: Impaired/Different from baseline Area of Impairment: Orientation;Following commands;Memory                               General Comments: Patient presents with confusion.  Oriented to self.  ABle to state he had a surgery.  Confused thinking news anchors are talking to him on television.   General Comments  Patient remains confused and required encouragement for participation this date.  Patient frequently distracted by television or would simply close his eyes stating it was time to take a nap.    Exercises Exercises: Other exercises Other Exercises Other Exercises: Provided education on role and goals of OT in acute care Other Exercises: Provided education on general safety including bed controls, positioning and use of call light for assistance Other Exercises: Patient completed bed mobility with MIN A for L LE.  Sat at EOB to complete simple grooming tasks.  Performed sit to stand with MOD A.  MOD/MAX cues needed for correct sequencing.  Returned to bed with MIN A for L LE.   Shoulder Instructions      Home Living Family/patient expects to be discharged to:: Private residence Living Arrangements: Spouse/significant other Available Help at Discharge: Family Type of Home: House Home Access: Stairs to enter Technical brewer of Steps: 3 Entrance Stairs-Rails: None Home Layout: One level               Home Equipment: Youth worker - 2 wheels;Cane - quad          Prior Functioning/Environment Level of Independence: Independent with assistive device(s)        Comments: Patient states he was MOD I for BADLs and used SPC for functional mobility.  Utilized Transport planner for community and  when gout was painful in past.        OT Problem List: Decreased strength;Decreased activity tolerance;Impaired balance (sitting and/or standing);Decreased safety awareness;Decreased cognition;Pain;Decreased knowledge of precautions;Decreased knowledge of use of DME or AE      OT Treatment/Interventions: Self-care/ADL training;Energy conservation;DME and/or AE instruction;Therapeutic activities;Patient/family education    OT Goals(Current goals can be found in the care plan section) Acute Rehab OT Goals Patient Stated Goal: "do how I was" OT Goal Formulation: With patient Time For Goal Achievement: 11/18/19 Potential to Achieve Goals: Good  OT Frequency: Min 2X/week   Barriers to D/C:            Co-evaluation              AM-PAC OT "6 Clicks" Daily Activity     Outcome Measure Help from another person eating meals?: None Help from another person taking care of personal grooming?: None(encouragement to participate) Help from another person toileting, which includes using toliet, bedpan, or urinal?: A Lot Help from another person bathing (including washing, rinsing, drying)?: A Lot Help from another person to put on and taking off regular upper body clothing?: A Little Help from another person to put on and taking off regular lower body clothing?: A Lot 6 Click Score: 17  End of Session Equipment Utilized During Treatment: Gait belt;Rolling walker Nurse Communication: Patient requests pain meds(Patient states 7.5/10 pain and requesting medication)  Activity Tolerance: Patient limited by fatigue;Patient tolerated treatment well Patient left: in bed;with call bell/phone within reach;with bed alarm set  OT Visit Diagnosis: Unsteadiness on feet (R26.81);History of falling (Z91.81)                Time: 8341-9622 OT Time Calculation (min): 18 min Charges:  OT General Charges $OT Visit: 1 Visit OT Evaluation $OT Eval Low Complexity: 1 Low OT Treatments $Therapeutic  Activity: 8-22 mins  Louanne Belton, MS, OTR/L 11/04/19, 2:14 PM

## 2019-11-04 NOTE — TOC Initial Note (Signed)
Transition of Care [facility]) - Initial/Assessment Note    Patient Details  Name: Carl Figueroa MRN: [id number] Date of Birth: 1946/07/20  Transition of Care [facility]) CM/SW Contact:    Su Hilt, RN Phone Number: 11/04/2019, 2:51 PM  Clinical Narrative:                 Met the patient and the wife at the bedside to discuss DC plan and needs He lives at home with his wife, he has a RW and a Corporate investment banker and a raised toilet at home, he needs a 3 in 1, I notified Brad with [employer], he is set up with [facility] He will need Lovenox and the price is over $287, I provided the patient with a good RX card to help with the cost, I explained with the Good RX card it would provide the lovenox under $100, his wife will provide transportation, he can afford his meds and is up to date with PCP Expected Discharge Plan: [place] Barriers to Discharge: Continued Medical Work up   Patient Goals and CMS Choice Patient states their goals for this hospitalization and ongoing recovery are:: go home CMS Medicare.gov Compare Post Acute Care list provided to:: Patient Choice offered to / list presented to : Patient  Expected Discharge Plan and Services Expected Discharge Plan: [place]   Discharge Planning Services: CM Consult   Living arrangements for the past 2 months: Single Family Home                 DME Arranged: 3-N-1 DME Agency: [employer] Date DME Agency Contacted: 11/04/19 Time DME Agency Contacted: 0093 Representative spoke with at DME Agency: [place]: PT [place]: [facility] (formerly [employer]) Date [place]: 11/04/19 Time [place]: 3 Representative spoke with at [place]: [place] Arrangements/Services Living arrangements for the past 2 months: [place] with:: Spouse Patient language and need for interpreter reviewed:: Yes Do you feel safe going back to the place  where you live?: Yes      Need for Family Participation in Patient Care: No (Comment) Care giver support system in place?: Yes (comment) Current home services: (rolling walker, rolator, cane, raised toilet) Criminal Activity/Legal Involvement Pertinent to Current Situation/Hospitalization: No - Comment as needed  Activities of Daily Living Home Assistive Devices/Equipment: None ADL Screening (condition at time of admission) Patient's cognitive ability adequate to safely complete daily activities?: Yes Is the patient deaf or have difficulty hearing?: No Does the patient have difficulty seeing, even when wearing glasses/contacts?: No Does the patient have difficulty concentrating, remembering, or making decisions?: No Patient able to express need for assistance with ADLs?: No Does the patient have difficulty dressing or bathing?: No Independently performs ADLs?: Yes (appropriate for developmental age) Does the patient have difficulty walking or climbing stairs?: No Weakness of Legs: Left Weakness of Arms/Hands: None  Permission Sought/Granted   Permission granted to share information with : Yes, Verbal Permission Granted              Emotional Assessment Appearance:: Appears stated age Attitude/Demeanor/Rapport: Engaged Affect (typically observed): Appropriate Orientation: : Oriented to  Time, Oriented to Situation, Oriented to Place, Oriented to Self Alcohol / Substance Use: Not Applicable Psych Involvement: No (comment)  Admission diagnosis:  Status post total hip replacement, left [G18.299] Patient Active Problem List   Diagnosis Date Noted  . Status post  total hip replacement, 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  . Demand ischemia ([place]) 07/30/2019  . Anxiety 07/30/2019  . Acute metabolic encephalopathy 59/96/8957  . Hematuria 07/08/2019  . Thrombocytopenia ([place]) 07/08/2019  . Elevated troponin 07/08/2019  . Sepsis ([place]) 07/07/2019  . Essential hypertension 07/07/2019  . Acute lower UTI  07/07/2019  . ARF (acute renal failure) ([place]) 07/07/2019   PCP:  Leonel Ramsay, MD Pharmacy:   [id number] [id number]: [phone]: [id number]: [phone]: [phone]    Social Determinants of Health (SDOH) Interventions    Readmission Risk Interventions No flowsheet data found.

## 2019-11-04 NOTE — Anesthesia Postprocedure Evaluation (Signed)
Anesthesia Post Note  Patient: Carl Figueroa  Procedure(s) Performed: TOTAL HIP ARTHROPLASTY ANTERIOR APPROACH (Left Hip)  Patient location during evaluation: Nursing Unit Anesthesia Type: Spinal Level of consciousness: awake, awake and alert and oriented Pain management: pain level controlled Vital Signs Assessment: vitals unstable and post-procedure vital signs reviewed and stable Respiratory status: spontaneous breathing, nonlabored ventilation and respiratory function stable Cardiovascular status: blood pressure returned to baseline and stable Postop Assessment: no headache and no backache Anesthetic complications: no Comments: C/O pain in left buttocks radiates to thigh, especially bad when sitting on commode     Last Vitals:  Vitals:   11/04/19 0605 11/04/19 0737  BP: (!) 115/53 (!) 123/58  Pulse: 82 72  Resp: 17   Temp: 36.8 C 36.9 C  SpO2: 99% 99%    Last Pain:  Vitals:   11/04/19 0737  TempSrc: Oral  PainSc:                  Ginger Carne

## 2019-11-04 NOTE — Discharge Summary (Signed)
Physician Discharge Summary  Patient ID: Carl Figueroa MRN: [id number] DOB/AGE: [birth year] 74 y.o.  Admit date: 11/03/2019 Discharge date: 11/06/2019 Admission Diagnoses:  Status post total hip replacement, left [Z96.642]   Discharge Diagnoses: Patient Active Problem List   Diagnosis Date Noted  . Status post total hip replacement, 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  . Demand ischemia (HCC) 07/30/2019  . Anxiety 07/30/2019  . Acute metabolic encephalopathy 07/08/2019  . Hematuria 07/08/2019  . Thrombocytopenia (HCC) 07/08/2019  . Elevated troponin 07/08/2019  . Sepsis (HCC) 07/07/2019  . Essential hypertension 07/07/2019  . Acute lower UTI 07/07/2019  . ARF (acute renal failure) (HCC) 07/07/2019    Past Medical History:  Diagnosis Date  . Anxiety   . Arthritis   . Hypertension      Transfusion: none   Consultants (if any):   Discharged Condition: Improved  Hospital Course: Carl Figueroa is an 74 y.o. male who was admitted 11/03/2019 with a diagnosis of left hip osteoarthritis and went to the operating room on 11/03/2019 and underwent the above named procedures.    Surgeries: Procedure(s): TOTAL HIP ARTHROPLASTY ANTERIOR APPROACH on 11/03/2019 Patient tolerated the surgery well. Taken to PACU where she was stabilized and then transferred to the orthopedic floor.  Started on Lovenox 40mg  q 24 hrs. Foot pumps applied bilaterally at 80 mm. Heels elevated on bed with rolled towels. No evidence of DVT. Negative [place]. Physical therapy started on day #1 for gait training and transfer. OT started day #1 for ADL and assisted devices.  Patient's foley was d/c on day #1. Patient's IV was d/c on day #2.  On post op day #3 patient was stable and ready for discharge to home with HHPT.  Implants: Medacta AMIS 4 standard stem with 52 mm mPACT DM cup and liner and XL 28 mm metal head  He was given perioperative antibiotics:  Anti-infectives (From admission, onward)   Start     Dose/Rate Route  Frequency Ordered Stop   11/03/19 1730  ceFAZolin (ANCEF) IVPB 2g/100 mL premix     2 g 200 mL/hr over 30 Minutes Intravenous Every 6 hours 11/03/19 1711 11/03/19 2357   11/03/19 1100  ceFAZolin (ANCEF) IVPB 2g/100 mL premix     2 g 200 mL/hr over 30 Minutes Intravenous On call to O.R. 11/03/19 1048 11/03/19 1344   11/03/19 1100  ceFAZolin (ANCEF) 2-4 GM/100ML-% IVPB    Note to Pharmacy: 11/05/19   : cabinet override      11/03/19 1100 11/03/19 1357    .  He was given sequential compression devices, early ambulation, and Lovenox TEDs for DVT prophylaxis.  He benefited maximally from the hospital stay and there were no complications.    Recent vital signs:  Vitals:   11/05/19 2359 11/06/19 0825  BP: (!) 152/64 125/67  Pulse: 92 88  Resp: 18 18  Temp: 99 F (37.2 C) 98.3 F (36.8 C)  SpO2: 99% 98%    Recent laboratory studies:  Lab Results  Component Value Date   HGB 9.9 (L) 11/05/2019   HGB 10.8 (L) 11/04/2019   HGB 12.7 (L) 11/03/2019   Lab Results  Component Value Date   WBC 8.4 11/05/2019   PLT 130 (L) 11/05/2019   No results found for: INR Lab Results  Component Value Date   NA 144 11/05/2019   K 3.8 11/05/2019   CL 110 11/05/2019   CO2 28 11/05/2019   BUN 13 11/05/2019   CREATININE 1.30 (H) 11/05/2019   GLUCOSE  123 (H) 11/05/2019    Discharge Medications:   Allergies as of 11/06/2019      Reactions   Morphine Nausea Only      Medication List    STOP taking these medications   aspirin 81 MG EC tablet   ibuprofen 200 MG tablet Commonly known as: ADVIL     TAKE these medications   amLODipine 5 MG tablet Commonly known as: NORVASC Take 1 tablet (5 mg total) by mouth daily. What changed: when to take this   atorvastatin 40 MG tablet Commonly known as: Lipitor Take 1 tablet (40 mg total) by mouth daily. What changed: when to take this   clonazePAM 0.5 MG tablet Commonly known as: KLONOPIN Take 0.5 tablets (0.25 mg total) by mouth 3  (three) times daily. What changed: when to take this   docusate sodium 100 MG capsule Commonly known as: COLACE Take 1 capsule (100 mg total) by mouth 2 (two) times daily.   donepezil 10 MG tablet Commonly known as: ARICEPT Take 10 mg by mouth every morning.   enoxaparin 40 MG/0.4ML injection Commonly known as: LOVENOX Inject 0.4 mLs (40 mg total) into the skin daily for 14 days.   lisinopril 40 MG tablet Commonly known as: ZESTRIL Take 20 mg by mouth every morning.   methocarbamol 500 MG tablet Commonly known as: ROBAXIN Take 1 tablet (500 mg total) by mouth every 6 (six) hours as needed for muscle spasms.   oxyCODONE 5 MG immediate release tablet Commonly known as: Oxy IR/ROXICODONE Take 1-2 tablets (5-10 mg total) by mouth every 4 (four) hours as needed for moderate pain (pain score 4-6).   venlafaxine XR 150 MG 24 hr capsule Commonly known as: EFFEXOR-XR Take 300 mg by mouth daily with breakfast.            Durable Medical Equipment  (From admission, onward)         Start     Ordered   11/03/19 1712  DME Walker rolling  Once    Question Answer Comment  Walker: With 5 Inch Wheels   Patient needs a walker to treat with the following condition Status post total hip replacement, left      11/03/19 1711   11/03/19 1712  DME 3 n 1  Once     11/03/19 1711   11/03/19 1712  DME Bedside commode  Once    Question:  Patient needs a bedside commode to treat with the following condition  Answer:  Status post total hip replacement, left   11/03/19 1711          Diagnostic Studies: DG HIP OPERATIVE UNILAT W OR W/O PELVIS LEFT  Result Date: 11/03/2019 CLINICAL DATA:  Left hip replacement EXAM: OPERATIVE left HIP (WITH PELVIS IF PERFORMED) 2 VIEWS TECHNIQUE: Fluoroscopic spot image(s) were submitted for interpretation post-operatively. COMPARISON:  CT 07/06/2019 FINDINGS: Two low resolution intraoperative spot views of the left hip. Total fluoroscopy time was 20 seconds.  Initial image demonstrates severe arthritis of left hip with obliteration of joint space and subarticular cysts. Subsequent image demonstrates surgical retractors over the left hip with placement of a left hip replacement. IMPRESSION: Intraoperative fluoroscopic assistance provided during left hip replacement. Electronically Signed   By: Donavan Foil M.D.   On: 11/03/2019 15:56   DG HIP UNILAT W OR W/O PELVIS 2-3 VIEWS LEFT  Result Date: 11/03/2019 CLINICAL DATA:  Status post hip replacement EXAM: DG HIP (WITH OR WITHOUT PELVIS) 2-3V LEFT COMPARISON:  CT 07/06/2019  FINDINGS: Status post left hip replacement with intact hardware and normal alignment. No fracture is seen. Cutaneous staples over the left hip. Small amount of gas in the soft tissues consistent with recent surgery IMPRESSION: Status post left hip replacement with expected postsurgical changes. Electronically Signed   By: Jasmine Pang M.D.   On: 11/03/2019 15:54    Disposition:     Follow-up Information    Evon Slack, PA-C Follow up in 2 week(s).   Specialties: Orthopedic Surgery, Emergency Medicine Contact information: [address] [place] [address] [phone]            Signed: Patience Musca 11/06/2019, 11:55 AM

## 2019-11-04 NOTE — Progress Notes (Signed)
Physical Therapy Treatment Patient Details Name: Carl Figueroa MRN: [id number] DOB: 1946-06-09 Today's Date: 11/04/2019    History of Present Illness Pt admitted for L THR. HIstory includes depression, HLD, OA, and PTSD.     PT Comments    Pt is making gradual progress towards goals with ability to ambulate short distances in room. Encouraged to continue mobility efforts with RN staff. Pt struggles the most with bed mobility/transfers and needs heavy encouragement for participation this afternoon. Educated extensively about discharge recs. Pt/wife want to go home and refuse SNF recommendation. Wife reports he has 8' to ambulate in home and can use electric scooter for longer distances. Educated on written HEP. Pt still confused to situation, baseline per discussion with wife. Reports high rating of pain, notified RN. Will continue to progress as able. May be appropriate for EMS transport home if he is unable to perform stair training.   Follow Up Recommendations  SNF(pt/family prefers home)     Equipment Recommendations  None recommended by PT    Recommendations for Other Services       Precautions / Restrictions Precautions Precautions: Anterior Hip;Fall Precaution Booklet Issued: Yes (comment) Restrictions Weight Bearing Restrictions: Yes LLE Weight Bearing: Weight bearing as tolerated    Mobility  Bed Mobility Overal bed mobility: Needs Assistance Bed Mobility: Sit to Supine     Supine to sit: Min assist;HOB elevated Sit to supine: Min assist   General bed mobility comments: able to hook R foot under L LE to guide back into bed. Using railing, he was able to slide up towards [facility]  Transfers Overall transfer level: Needs assistance Equipment used: Rolling walker (2 wheeled) Transfers: Sit to/from Stand Sit to Stand: Mod assist         General transfer comment: needs assist to stand from lower surface. Cues for hand placement. Once standing, upright posture  noted  Ambulation/Gait Ambulation/Gait assistance: Min assist Gait Distance (Feet): 40 Feet Assistive device: Rolling walker (2 wheeled) Gait Pattern/deviations: Step-to pattern     General Gait Details: ambulated in room with safe technique. Takes increased encouragement for participation and reports dizziness, however unsure of accuracy. Doesn't appear to be limited by dizziness complaints   Stairs             Wheelchair Mobility    Modified Rankin (Stroke Patients Only)       Balance Overall balance assessment: Needs assistance Sitting-balance support: Feet supported;No upper extremity supported Sitting balance-Leahy Scale: Good     Standing balance support: Bilateral upper extremity supported Standing balance-Leahy Scale: Fair Standing balance comment: heavy B UE use on RW                            Cognition Arousal/Alertness: Awake/alert Behavior During Therapy: WFL for tasks assessed/performed Overall Cognitive Status: History of cognitive impairments - at baseline Area of Impairment: Orientation;Following commands;Memory                               General Comments: very confused and slightly difficult to reorient      Exercises Other Exercises Other Exercises: Provided education on role and goals of OT in acute care Other Exercises: Provided education on general safety including bed controls, positioning and use of call light for assistance Other Exercises: Patient completed bed mobility with MIN A for L LE.  Sat at EOB to complete simple grooming tasks.  Performed sit to stand with MOD A.  MOD/MAX cues needed for correct sequencing.  Returned to bed with MIN A for L LE. Other Exercises: supine/seated ther-ex performed on L LE including AP, quad sets, glut sets, hip abd/add, LAQ, hip add squeezes, and SAQ. All ther-ex performed x 10 reps with cga/min assist. Written HEP given and reviewed.    General Comments General comments  (skin integrity, edema, etc.): Patient remains confused and required encouragement for participation this date.  Patient frequently distracted by television or would simply close his eyes stating it was time to take a nap.      Pertinent Vitals/Pain Pain Assessment: 0-10 Pain Score: 8  Pain Location: L hip/surgical site Pain Descriptors / Indicators: Operative site guarding;Aching;Discomfort Pain Intervention(s): Limited activity within patient's tolerance;Patient requesting pain meds-RN notified    Home Living Family/patient expects to be discharged to:: Private residence Living Arrangements: Spouse/significant other Available Help at Discharge: Family Type of Home: House Home Access: Stairs to enter Entrance Stairs-Rails: None Home Layout: One level Home Equipment: Youth worker - 2 wheels;Cane - quad      Prior Function Level of Independence: Independent with assistive device(s)      Comments: Patient states he was MOD I for BADLs and used SPC for functional mobility.  Utilized Transport planner for community and when gout was painful in past.   PT Goals (current goals can now be found in the care plan section) Acute Rehab PT Goals Patient Stated Goal: to go home PT Goal Formulation: With patient Time For Goal Achievement: 11/18/19 Potential to Achieve Goals: Good Progress towards PT goals: Progressing toward goals    Frequency    BID      PT Plan Current plan remains appropriate    Co-evaluation              AM-PAC PT "6 Clicks" Mobility   Outcome Measure  Help needed turning from your back to your side while in a flat bed without using bedrails?: A Little Help needed moving from lying on your back to sitting on the side of a flat bed without using bedrails?: A Little Help needed moving to and from a bed to a chair (including a wheelchair)?: A Lot Help needed standing up from a chair using your arms (e.g., wheelchair or bedside chair)?: A  Lot Help needed to walk in hospital room?: A Little Help needed climbing 3-5 steps with a railing? : A Lot 6 Click Score: 15    End of Session Equipment Utilized During Treatment: Gait belt Activity Tolerance: Patient tolerated treatment well Patient left: in bed;with bed alarm set;with SCD's reapplied Nurse Communication: Mobility status PT Visit Diagnosis: Muscle weakness (generalized) (M62.81);Difficulty in walking, not elsewhere classified (R26.2);Pain Pain - Right/Left: Left Pain - part of body: Hip     Time: 0623-7628 PT Time Calculation (min) (ACUTE ONLY): 49 min  Charges:  $Gait Training: 8-22 mins $Therapeutic Exercise: 23-37 mins                     Greggory Stallion, PT, DPT [phone]    Carl Figueroa 11/04/2019, 4:56 PM

## 2019-11-04 NOTE — Progress Notes (Signed)
   Subjective: 1 Day Post-Op Procedure(s) (LRB): TOTAL HIP ARTHROPLASTY ANTERIOR APPROACH (Left) Patient reports pain as 8 on 0-10 scale.   Patient is well, and has had no acute complaints or problems Denies any CP, SOB, ABD pain. We will continue therapy today.  Plan is to go Home after hospital stay.  Objective: Vital signs in last 24 hours: Temp:  [96.7 F (35.9 C)-98.4 F (36.9 C)] 98.4 F (36.9 C) (03/17 0737) Pulse Rate:  [55-91] 72 (03/17 0737) Resp:  [13-20] 17 (03/17 0605) BP: (93-131)/(47-98) 123/58 (03/17 0737) SpO2:  [96 %-100 %] 99 % (03/17 0737) Weight:  [80.7 kg] 80.7 kg (03/16 1052)  Intake/Output from previous day: 03/16 0701 - 03/17 0700 In: 900 [I.V.:900] Out: 730 [Urine:230; Stool:100; Blood:400] Intake/Output this shift: No intake/output data recorded.  Recent Labs    11/03/19 1730 11/04/19 0545  HGB 12.7* 10.8*   Recent Labs    11/03/19 1730 11/04/19 0545  WBC 7.0 6.8  RBC 4.45 3.80*  HCT 38.5* 32.6*  PLT 151 140*   Recent Labs    11/03/19 1730 11/04/19 0545  NA  --  144  K  --  3.6  CL  --  111  CO2  --  27  BUN  --  13  CREATININE 1.08 1.27*  GLUCOSE  --  133*  CALCIUM  --  8.6*   No results for input(s): LABPT, INR in the last 72 hours.  EXAM General - Patient is Alert, Appropriate and Oriented Extremity - Neurovascular intact Sensation intact distally Intact pulses distally Dorsiflexion/Plantar flexion intact No cellulitis present Compartment soft Dressing -honeycomb intact with ABD pad covering dressing due to some drainage yesterday.  ABD pad is intact, clean and dry.  Thigh is soft. Motor Function - intact, moving foot and toes well on exam.   Past Medical History:  Diagnosis Date  . Anxiety   . Arthritis   . Hypertension     Assessment/Plan:   1 Day Post-Op Procedure(s) (LRB): TOTAL HIP ARTHROPLASTY ANTERIOR APPROACH (Left) Active Problems:   Status post total hip replacement, left  Estimated body mass  index is 28.3 kg/m as calculated from the following:   Height as of this encounter: 5' 6.5" (1.689 m).   Weight as of this encounter: 80.7 kg. Advance diet Up with therapy  Needs BM Labs stable VSS Recheck labs in the am CM to assist with discharge to home with HHPT  DVT Prophylaxis - Lovenox, TED hose and SCDs Weight-Bearing as tolerated to left leg   T. Cranston Neighbor, PA-C [facility] 11/04/2019, 8:05 AM

## 2019-11-04 NOTE — TOC Benefit Eligibility Note (Signed)
Transition of Care [facility]) Benefit Eligibility Note    Patient Details  Name: FABRIZZIO MARCELLA MRN: [id number] Date of Birth: [birth year]   Medication/Dose: Enoxaparin 40mg  qd x14 days  Covered?: Yes     Prescription Coverage Preferred Pharmacy: [facility], [facility], and [facility]  Spoke with Person/Company/Phone Number:: [employer]  Co-Pay: $287.32 for 14 day supply  Prior Approval: No  Deductible: Unmet(deductible is: $330.00/ $0 has been applied so far)       [phone] Phone Number: 11/04/2019, 11:34 AM

## 2019-11-04 NOTE — Discharge Instructions (Signed)

## 2019-11-04 NOTE — Evaluation (Signed)
Physical Therapy Evaluation Patient Details Name: Carl Figueroa MRN: [id number] DOB: 01/21/1946 Today's Date: 11/04/2019   History of Present Illness  Pt admitted for L THR. HIstory includes depression, HLD, OA, and PTSD.   Clinical Impression  Pt is a pleasant 74 year old confused male who was admitted for L THR. Pt performs bed mobility with min assist, transfers with mod assist, and ambulation with min assist and RW. Good WBing noted on B LEs and able to follow commands, however limited due to confusion. Pt thinks he is at home, year is 2023, and he is seeing TVs out his window (the reflection of the computer in the window). He is also complaining of pain with all mobility. Pt demonstrates deficits with cognition/strength/mobility. Pt is not safe to dc home at this time. Would benefit from skilled PT to address above deficits and promote optimal return to PLOF; recommend transition to STR upon discharge from acute hospitalization.     Follow Up Recommendations SNF(pt prefers home, refusing SNF)    Equipment Recommendations  None recommended by PT    Recommendations for Other Services       Precautions / Restrictions Precautions Precautions: Anterior Hip;Fall Precaution Booklet Issued: No Restrictions Weight Bearing Restrictions: Yes LLE Weight Bearing: Weight bearing as tolerated      Mobility  Bed Mobility Overal bed mobility: Needs Assistance Bed Mobility: Supine to Sit     Supine to sit: Min assist     General bed mobility comments: able to follow commands but limited by pain. Upright posture once seated at EOB, although does experience nausea. Once returned back supine, min assist for scooting up towards [facility] with pt able to use UE assist to grab railing  Transfers Overall transfer level: Needs assistance Equipment used: Rolling walker (2 wheeled) Transfers: Sit to/from Stand Sit to Stand: Mod assist         General transfer comment: from elevated surface.  Unsteady with slightly flexed posture. Needs cues for upright stance.  Ambulation/Gait Ambulation/Gait assistance: Min assist Gait Distance (Feet): 3 Feet Assistive device: Rolling walker (2 wheeled) Gait Pattern/deviations: Step-to pattern     General Gait Details: was able to perform side stepping towards HOB. Follows commands. Further gait training deferred due to safely/cognition  Stairs            Wheelchair Mobility    Modified Rankin (Stroke Patients Only)       Balance Overall balance assessment: Needs assistance Sitting-balance support: Feet supported;No upper extremity supported Sitting balance-Leahy Scale: Good     Standing balance support: Bilateral upper extremity supported Standing balance-Leahy Scale: Fair Standing balance comment: heavy B UE use on RW                             Pertinent Vitals/Pain Pain Assessment: 0-10 Pain Score: 8  Pain Location: L hip Pain Descriptors / Indicators: Operative site guarding Pain Intervention(s): Limited activity within patient's tolerance;Repositioned    Home Living Family/patient expects to be discharged to:: Private residence Living Arrangements: Spouse/significant other Available Help at Discharge: Family Type of Home: House Home Access: Stairs to enter Entrance Stairs-Rails: None Entrance Stairs-Number of Steps: 3 Home Layout: One level Home Equipment: Youth worker - 2 wheels;Cane - quad      Prior Function Level of Independence: Independent with assistive device(s)         Comments: was indep using SPC prior to admission and used electric scooter for community  distances     Hand Dominance        Extremity/Trunk Assessment   Upper Extremity Assessment Upper Extremity Assessment: Overall WFL for tasks assessed    Lower Extremity Assessment Lower Extremity Assessment: Generalized weakness(L LE grossly 3/5; R LE grossly 4/5)       Communication   Communication:  No difficulties  Cognition Arousal/Alertness: Awake/alert Behavior During Therapy: WFL for tasks assessed/performed Overall Cognitive Status: Impaired/Different from baseline                                 General Comments: pt confused thinking he is at home and his wife is next door. Pt able to follow commands; decreased safety awareness. He reports seeing things out the window      General Comments      Exercises Other Exercises Other Exercises: supine ther-ex performed on L LE including AP, quad sets, SLRs, and hip abd/add. Needs mod assist for ther-ex performance with cues for participation/attention to task. Reports increased pain with exertion.   Assessment/Plan    PT Assessment Patient needs continued PT services  PT Problem List Decreased strength;Decreased balance;Decreased mobility;Decreased cognition;Decreased safety awareness;Pain       PT Treatment Interventions DME instruction;Gait training;Stair training;Therapeutic activities;Therapeutic exercise;Balance training    PT Goals (Current goals can be found in the Care Plan section)  Acute Rehab PT Goals Patient Stated Goal: to go home PT Goal Formulation: With patient Time For Goal Achievement: 11/18/19 Potential to Achieve Goals: Good    Frequency BID   Barriers to discharge        Co-evaluation               AM-PAC PT "6 Clicks" Mobility  Outcome Measure Help needed turning from your back to your side while in a flat bed without using bedrails?: A Little Help needed moving from lying on your back to sitting on the side of a flat bed without using bedrails?: A Little Help needed moving to and from a bed to a chair (including a wheelchair)?: A Lot Help needed standing up from a chair using your arms (e.g., wheelchair or bedside chair)?: A Lot Help needed to walk in hospital room?: A Lot Help needed climbing 3-5 steps with a railing? : A Lot 6 Click Score: 14    End of Session  Equipment Utilized During Treatment: Gait belt Activity Tolerance: Patient tolerated treatment well Patient left: in bed;with bed alarm set;with SCD's reapplied Nurse Communication: Mobility status PT Visit Diagnosis: Muscle weakness (generalized) (M62.81);Difficulty in walking, not elsewhere classified (R26.2);Pain Pain - Right/Left: Left Pain - part of body: Hip    Time: 0902-0927 PT Time Calculation (min) (ACUTE ONLY): 25 min   Charges:   PT Evaluation $PT Eval Moderate Complexity: 1 Mod PT Treatments $Therapeutic Exercise: 8-22 mins        Elizabeth Palau, PT, DPT [phone]   Davonna Ertl 11/04/2019, 11:02 AM

## 2019-11-05 LAB — CBC
HCT: 29.8 % — ABNORMAL LOW (ref 39.0–52.0)
Hemoglobin: 9.9 g/dL — ABNORMAL LOW (ref 13.0–17.0)
MCH: 28.4 pg (ref 26.0–34.0)
MCHC: 33.2 g/dL (ref 30.0–36.0)
MCV: 85.4 fL (ref 80.0–100.0)
Platelets: 130 10*3/uL — ABNORMAL LOW (ref 150–400)
RBC: 3.49 MIL/uL — ABNORMAL LOW (ref 4.22–5.81)
RDW: 12.6 % (ref 11.5–15.5)
WBC: 8.4 10*3/uL (ref 4.0–10.5)
nRBC: 0 % (ref 0.0–0.2)

## 2019-11-05 LAB — BASIC METABOLIC PANEL
Anion gap: 6 (ref 5–15)
BUN: 13 mg/dL (ref 8–23)
CO2: 28 mmol/L (ref 22–32)
Calcium: 8.8 mg/dL — ABNORMAL LOW (ref 8.9–10.3)
Chloride: 110 mmol/L (ref 98–111)
Creatinine, Ser: 1.3 mg/dL — ABNORMAL HIGH (ref 0.61–1.24)
GFR calc Af Amer: >60 mL/min (ref >60)
GFR calc non Af Amer: 54 mL/min — ABNORMAL LOW (ref >60)
Glucose, Bld: 123 mg/dL — ABNORMAL HIGH (ref 70–99)
Potassium: 3.8 mmol/L (ref 3.5–5.1)
Sodium: 144 mmol/L (ref 135–145)

## 2019-11-05 LAB — SURGICAL PATHOLOGY

## 2019-11-05 MED ORDER — MAGNESIUM HYDROXIDE 400 MG/5ML PO SUSP
30.0000 mL | Freq: Once | ORAL | Status: AC
  Administered 2019-11-05: 30 mL via ORAL
  Filled 2019-11-05: qty 30

## 2019-11-05 NOTE — Progress Notes (Signed)
Physical Therapy Treatment Patient Details Name: Carl Figueroa MRN: [id number] DOB: Sep 02, 1945 Today's Date: 11/05/2019    History of Present Illness Pt admitted for L THR. HIstory includes depression, HLD, OA, and PTSD.     PT Comments    Pt is making good progress towards goals with ability to ambulate in hallway with [facility] follow for safety. Still remains confused, however able to follow commands easily. Wife in room for session. Good endurance with there-ex and pain under control. Appropriate to update recs for home discharge per performance with stair training. Will continue to progress as able.  Follow Up Recommendations  Home health PT;Supervision/Assistance - 24 hour     Equipment Recommendations  None recommended by PT    Recommendations for Other Services       Precautions / Restrictions Precautions Precautions: Anterior Hip;Fall Precaution Booklet Issued: Yes (comment) Restrictions Weight Bearing Restrictions: Yes LLE Weight Bearing: Weight bearing as tolerated    Mobility  Bed Mobility Overal bed mobility: Needs Assistance Bed Mobility: Supine to Sit     Supine to sit: Min assist     General bed mobility comments: cues for sequencing. Follows commands to safely come to EOB.  Transfers Overall transfer level: Needs assistance Equipment used: Rolling walker (2 wheeled) Transfers: Sit to/from Stand Sit to Stand: Min assist         General transfer comment: needs cues for pushing from seated surface. Once standing, upright posture. Good WBing noted through B LE  Ambulation/Gait Ambulation/Gait assistance: Min guard Gait Distance (Feet): 65 Feet Assistive device: Rolling walker (2 wheeled) Gait Pattern/deviations: Step-through pattern     General Gait Details: able to ambulate out to RN station with improved reciprocal gait pattern. Needs cues to keep RW close to body. Urgent bathroon need, ceasing ambulation. No complaints of dizziness  noted.   Stairs             Wheelchair Mobility    Modified Rankin (Stroke Patients Only)       Balance Overall balance assessment: Needs assistance Sitting-balance support: Feet supported;No upper extremity supported Sitting balance-Leahy Scale: Good     Standing balance support: Bilateral upper extremity supported Standing balance-Leahy Scale: Good                              Cognition Arousal/Alertness: Awake/alert Behavior During Therapy: WFL for tasks assessed/performed Overall Cognitive Status: History of cognitive impairments - at baseline                                 General Comments: improved safety awareness, still confused to place and thinks his surgery was several days ago      Exercises Other Exercises Other Exercises: supine ther-ex performed on L LE including AP, quad sets, SLRs, hip ab/add, hip add squeezes, and glut sets x 12 reps and min assist. Reviewed written HEP. Other Exercises: able to stand at bedside for urinal use. Needs min assist for set up and holding urinal secondary to balance    General Comments        Pertinent Vitals/Pain Pain Assessment: 0-10 Pain Score: 1  Pain Location: L hip/surgical site Pain Descriptors / Indicators: Operative site guarding;Aching;Discomfort Pain Intervention(s): Limited activity within patient's tolerance;Repositioned    Home Living  Prior Function            PT Goals (current goals can now be found in the care plan section) Acute Rehab PT Goals Patient Stated Goal: to go home PT Goal Formulation: With patient Time For Goal Achievement: 11/18/19 Potential to Achieve Goals: Good Progress towards PT goals: Progressing toward goals    Frequency    BID      PT Plan Discharge plan needs to be updated    Co-evaluation              AM-PAC PT "6 Clicks" Mobility   Outcome Measure  Help needed turning from your back to  your side while in a flat bed without using bedrails?: A Little Help needed moving from lying on your back to sitting on the side of a flat bed without using bedrails?: A Little Help needed moving to and from a bed to a chair (including a wheelchair)?: A Little Help needed standing up from a chair using your arms (e.g., wheelchair or bedside chair)?: A Little Help needed to walk in hospital room?: A Little Help needed climbing 3-5 steps with a railing? : A Lot 6 Click Score: 17    End of Session Equipment Utilized During Treatment: Gait belt Activity Tolerance: Patient tolerated treatment well Patient left: in chair;with chair alarm set Nurse Communication: Mobility status PT Visit Diagnosis: Muscle weakness (generalized) (M62.81);Difficulty in walking, not elsewhere classified (R26.2);Pain Pain - Right/Left: Left Pain - part of body: Hip     Time: 6295-2841 PT Time Calculation (min) (ACUTE ONLY): 39 min  Charges:  $Gait Training: 8-22 mins $Therapeutic Exercise: 8-22 mins $Therapeutic Activity: 8-22 mins                     Greggory Stallion, PT, DPT [phone]    Wahneta Derocher 11/05/2019, 11:19 AM

## 2019-11-05 NOTE — Progress Notes (Signed)
Physical Therapy Treatment Patient Details Name: Carl Figueroa MRN: [id number] DOB: 04/21/1946 Today's Date: 11/05/2019    History of Present Illness Pt admitted for L THR. HIstory includes depression, HLD, OA, and PTSD.     PT Comments    Pt is making good progress towards goals and performed stair training safely. +2 given for safety as pt still confused. Extensive training/education given prior to performance. Good endurance with HEP and increased gait distance noted, although very slow speed. Will continue to progress as able.   Follow Up Recommendations  Home health PT;Supervision/Assistance - 24 hour     Equipment Recommendations  None recommended by PT    Recommendations for Other Services       Precautions / Restrictions Precautions Precautions: Anterior Hip;Fall Precaution Booklet Issued: Yes (comment) Restrictions Weight Bearing Restrictions: Yes LLE Weight Bearing: Weight bearing as tolerated    Mobility  Bed Mobility Overal bed mobility: Needs Assistance Bed Mobility: Supine to Sit     Supine to sit: Min assist Sit to supine: Min assist   General bed mobility comments: able to hook foot under and reach for railing to perform task. Needs cues for initiation. Once seated at EOB, upright posture noted  Transfers Overall transfer level: Needs assistance Equipment used: Rolling walker (2 wheeled) Transfers: Sit to/from Stand Sit to Stand: Min assist         General transfer comment: needs cues for pushing from seated surface. Once standing, upright posture. Good Wbing.  Ambulation/Gait Ambulation/Gait assistance: Min guard Gait Distance (Feet): 70 Feet Assistive device: Rolling walker (2 wheeled) Gait Pattern/deviations: Step-through pattern     General Gait Details: ambulated in hallway with very slow gait pattern and reciprocal gait. Needs cues to stay close to RW. Fatigues with increased distance. Chair follow for safety   Stairs Stairs:  Yes Stairs assistance: Min assist Stair Management: No rails;With walker;Backwards Number of Stairs: 3 General stair comments: up/down backwards with copious education prior to performance. +2 for assist for safety. Step to gait pattern performed   Wheelchair Mobility    Modified Rankin (Stroke Patients Only)       Balance Overall balance assessment: Needs assistance Sitting-balance support: Feet supported;No upper extremity supported Sitting balance-Leahy Scale: Good     Standing balance support: Bilateral upper extremity supported Standing balance-Leahy Scale: Good Standing balance comment: heavy B UE use on RW                            Cognition Arousal/Alertness: Awake/alert Behavior During Therapy: WFL for tasks assessed/performed Overall Cognitive Status: History of cognitive impairments - at baseline                                 General Comments: slight confusion, no impulsive behavior noted      Exercises Other Exercises Other Exercises: supine ther-ex performed on L LE including AP, quad sets, SAQ, SLRs, hip ab/add, hip add squeezes, and glut sets x 12 reps and min assist. Reviewed written HEP.    General Comments        Pertinent Vitals/Pain Pain Assessment: 0-10 Pain Score: 8  Pain Location: L hip/surgical site Pain Descriptors / Indicators: Operative site guarding;Aching;Discomfort Pain Intervention(s): Limited activity within patient's tolerance;Premedicated before session    Home Living  Prior Function            PT Goals (current goals can now be found in the care plan section) Acute Rehab PT Goals Patient Stated Goal: to go home PT Goal Formulation: With patient Time For Goal Achievement: 11/18/19 Potential to Achieve Goals: Good Progress towards PT goals: Progressing toward goals    Frequency    BID      PT Plan Current plan remains appropriate    Co-evaluation               AM-PAC PT "6 Clicks" Mobility   Outcome Measure  Help needed turning from your back to your side while in a flat bed without using bedrails?: A Little Help needed moving from lying on your back to sitting on the side of a flat bed without using bedrails?: A Little Help needed moving to and from a bed to a chair (including a wheelchair)?: A Little Help needed standing up from a chair using your arms (e.g., wheelchair or bedside chair)?: A Little Help needed to walk in hospital room?: A Little Help needed climbing 3-5 steps with a railing? : A Lot 6 Click Score: 17    End of Session Equipment Utilized During Treatment: Gait belt Activity Tolerance: Patient tolerated treatment well Patient left: in bed;with bed alarm set Nurse Communication: Mobility status PT Visit Diagnosis: Muscle weakness (generalized) (M62.81);Difficulty in walking, not elsewhere classified (R26.2);Pain Pain - Right/Left: Left Pain - part of body: Hip     Time: 9147-8295 PT Time Calculation (min) (ACUTE ONLY): 43 min  Charges:  $Gait Training: 23-37 mins $Therapeutic Exercise: 8-22 mins                     Elizabeth Palau, PT, DPT [phone]    Syleena Mchan 11/05/2019, 3:16 PM

## 2019-11-05 NOTE — Progress Notes (Signed)
OT Cancellation Note  Patient Details Name: Carl Figueroa MRN: [id number] DOB: 1945-11-18   Cancelled Treatment:    Reason Eval/Treat Not Completed: Other (comment)   Patient approached for OT session.  Supine in bed with eyes closed.  Patient states "please not now".  Patient refusing therapy services at this time.  Will follow up/re-attempt as able and appropriate.  Thank you.   Wynn Maudlin 11/05/2019, 5:19 PM

## 2019-11-05 NOTE — Progress Notes (Signed)
   Subjective: 2 Days Post-Op Procedure(s) (LRB): TOTAL HIP ARTHROPLASTY ANTERIOR APPROACH (Left) Patient reports pain as mild.  Much improved. Patient is well, and has had no acute complaints or problems Denies any CP, SOB, ABD pain. We will continue therapy today.  Plan is to go Home after hospital stay.  Objective: Vital signs in last 24 hours: Temp:  [97.7 F (36.5 C)-99 F (37.2 C)] 98.2 F (36.8 C) (03/18 0840) Pulse Rate:  [67-96] 77 (03/18 0840) Resp:  [15-18] 15 (03/18 0840) BP: (98-121)/(50-71) 113/50 (03/18 0840) SpO2:  [93 %-100 %] 95 % (03/18 0840)  Intake/Output from previous day: 03/17 0701 - 03/18 0700 In: 939.1 [P.O.:480; I.V.:459.1] Out: 575 [Urine:575] Intake/Output this shift: No intake/output data recorded.  Recent Labs    11/03/19 1730 11/04/19 0545 11/05/19 0430  HGB 12.7* 10.8* 9.9*   Recent Labs    11/04/19 0545 11/05/19 0430  WBC 6.8 8.4  RBC 3.80* 3.49*  HCT 32.6* 29.8*  PLT 140* 130*   Recent Labs    11/04/19 0545 11/05/19 0430  NA 144 144  K 3.6 3.8  CL 111 110  CO2 27 28  BUN 13 13  CREATININE 1.27* 1.30*  GLUCOSE 133* 123*  CALCIUM 8.6* 8.8*   No results for input(s): LABPT, INR in the last 72 hours.  EXAM General - Patient is Alert, Appropriate and Oriented Extremity - Neurovascular intact Sensation intact distally Intact pulses distally Dorsiflexion/Plantar flexion intact No cellulitis present Compartment soft Dressing -clean dry and intact.  New honeycomb applied today. Motor Function - intact, moving foot and toes well on exam.   Past Medical History:  Diagnosis Date  . Anxiety   . Arthritis   . Hypertension     Assessment/Plan:   2 Days Post-Op Procedure(s) (LRB): TOTAL HIP ARTHROPLASTY ANTERIOR APPROACH (Left) Active Problems:   Status post total hip replacement, left  Estimated body mass index is 28.3 kg/m as calculated from the following:   Height as of this encounter: 5' 6.5" (1.689 m).  Weight as of this encounter: 80.7 kg. Advance diet Up with therapy  Needs BM Labs stable VSS Pain much improved today.  Good progress of physical therapy today. CM to assist with discharge to home with HHPT today or tomorrow pending progress with PT and bowel movement  DVT Prophylaxis - Lovenox, TED hose and SCDs Weight-Bearing as tolerated to left leg   T. Cranston Neighbor, PA-C [facility] 11/05/2019, 10:31 AM

## 2019-11-06 NOTE — Progress Notes (Signed)
Patient left with [facility] and other belongings along with discharge instructions to Home with wife. W/C transport. IV removed

## 2019-11-06 NOTE — Care Management Important Message (Signed)
Important Message  Patient Details  Name: Carl Figueroa MRN: [id number] Date of Birth: 12/04/1945   Medicare Important Message Given:  Yes     Olegario Messier A Moriah Shawley 11/06/2019, 1:37 PM

## 2019-11-06 NOTE — TOC Transition Note (Signed)
Transition of Care [facility]) - CM/SW Discharge Note   Patient Details  Name: Carl Figueroa MRN: [id number] Date of Birth: 31-Dec-1945  Transition of Care [facility] CM/SW Contact:  Barrie Dunker, RN Phone Number: 11/06/2019, 12:06 PM   Clinical Narrative:    [facility] to [facility] with [facility], 3 in 1 is in the room for him to take home, he can afford his medications, no additional needs   Final next level of care: Home w Home Health Services Barriers to Discharge: Barriers Resolved   Patient Goals and CMS Choice Patient states their goals for this hospitalization and ongoing recovery are:: go home CMS Medicare.gov Compare Post Acute Care list provided to:: Patient Choice offered to / list presented to : Patient  Discharge Placement                       Discharge Plan and Services   Discharge Planning Services: CM Consult            DME Arranged: 3-N-1 DME Agency: [employer] Date DME Agency Contacted: 11/04/19 Time DME Agency Contacted: 1450 Representative spoke with at DME Agency: Nida Boatman HH Arranged: PT HH Agency: [facility] Date HH Agency Contacted: 11/04/19 Time HH Agency Contacted: 1451 Representative spoke with at [facility] Agency: Rosey Bath  Social Determinants of Health (SDOH) Interventions     Readmission Risk Interventions No flowsheet data found.

## 2019-11-06 NOTE — Progress Notes (Signed)
Physical Therapy Treatment Patient Details Name: Carl Figueroa MRN: [id number] DOB: 10-17-1945 Today's Date: 11/06/2019    History of Present Illness Pt admitted for L THR. HIstory includes depression, HLD, OA, and PTSD.     PT Comments    Pt is making good progress towards goals with decreased physical assist for ambulation. Safe technique using RW. UPright posture and little pain in surgical leg. Pt still remains confused and needs step by step instruction for task performance. Little carryover noted. Pt has met all goals for PT, safe to dc home this date, RN informed. Reviewed written HEP and stair training.   Follow Up Recommendations  Home health PT;Supervision/Assistance - 24 hour     Equipment Recommendations  None recommended by PT    Recommendations for Other Services       Precautions / Restrictions Precautions Precautions: Anterior Hip;Fall Precaution Booklet Issued: Yes (comment) Precaution Comments: reviewed stair training and wrote instructions for sequencing Restrictions Weight Bearing Restrictions: Yes LLE Weight Bearing: Weight bearing as tolerated    Mobility  Bed Mobility Overal bed mobility: Needs Assistance Bed Mobility: Supine to Sit     Supine to sit: Min assist     General bed mobility comments: needs cues for sequencing. ONce seated, upright posture noted  Transfers Overall transfer level: Needs assistance Equipment used: Rolling walker (2 wheeled) Transfers: Sit to/from Stand Sit to Stand: Min assist         General transfer comment: able to rise from lower surface. Needs cues for hand placement and anterior translation of balance  Ambulation/Gait Ambulation/Gait assistance: Min guard Gait Distance (Feet): 40 Feet Assistive device: Rolling walker (2 wheeled) Gait Pattern/deviations: Step-through pattern     General Gait Details: ambulated in room with safe technique. No cues needed for proximity of body<>RW. Able to follow  commands safely. Upright posture   Stairs             Wheelchair Mobility    Modified Rankin (Stroke Patients Only)       Balance Overall balance assessment: Needs assistance Sitting-balance support: Feet supported;No upper extremity supported Sitting balance-Leahy Scale: Good     Standing balance support: Bilateral upper extremity supported Standing balance-Leahy Scale: Good                              Cognition Arousal/Alertness: Awake/alert Behavior During Therapy: WFL for tasks assessed/performed Overall Cognitive Status: History of cognitive impairments - at baseline Area of Impairment: Orientation;Following commands;Memory                 Orientation Level: Place   Memory: Decreased short-term memory Following Commands: Follows one step commands consistently;Follows one step commands with increased time       General Comments: slight confusion, no impulsive behavior noted      Exercises Other Exercises Other Exercises: Patient transitioned to EOB, completed sit to stand with MIN A.  MIN A for SPT to recliner.  Performed grooming tasks while seated in recliner with SPV/SBA. Other Exercises: supine ther-ex performed on L LE including AP, quad sets, hip abd/add, and SAQ. All ther-ex performed x 15 reps with cga/min assist. Written HEP given and reviewed.    General Comments General comments (skin integrity, edema, etc.): Demonstrated improved participation, but remains confused.  Frequently preoccupied with items he saw on floor.  Needs cues to maintain attention to task as well as for task initiation.      Pertinent  Vitals/Pain Pain Assessment: No/denies pain    Home Living                      Prior Function            PT Goals (current goals can now be found in the care plan section) Acute Rehab PT Goals Patient Stated Goal: to go home PT Goal Formulation: With patient Time For Goal Achievement: 11/18/19 Potential  to Achieve Goals: Good Progress towards PT goals: Progressing toward goals    Frequency    BID      PT Plan Current plan remains appropriate    Co-evaluation PT/OT/SLP Co-Evaluation/Treatment: Yes Reason for Co-Treatment: Necessary to address cognition/behavior during functional activity PT goals addressed during session: Mobility/safety with mobility        AM-PAC PT "6 Clicks" Mobility   Outcome Measure  Help needed turning from your back to your side while in a flat bed without using bedrails?: A Little Help needed moving from lying on your back to sitting on the side of a flat bed without using bedrails?: A Little Help needed moving to and from a bed to a chair (including a wheelchair)?: A Little Help needed standing up from a chair using your arms (e.g., wheelchair or bedside chair)?: A Little Help needed to walk in hospital room?: A Little Help needed climbing 3-5 steps with a railing? : A Little 6 Click Score: 18    End of Session Equipment Utilized During Treatment: Gait belt Activity Tolerance: Patient tolerated treatment well Patient left: in chair(left with OT finishing treatment) Nurse Communication: Mobility status PT Visit Diagnosis: Muscle weakness (generalized) (M62.81);Difficulty in walking, not elsewhere classified (R26.2);Pain Pain - Right/Left: Left Pain - part of body: Hip     Time: 5625-6389 PT Time Calculation (min) (ACUTE ONLY): 23 min  Charges:  $Therapeutic Exercise: 8-22 mins                     Greggory Stallion, PT, DPT [phone]    [name] 11/06/2019, 11:54 AM

## 2019-11-06 NOTE — Progress Notes (Addendum)
   Subjective: 3 Days Post-Op Procedure(s) (LRB): TOTAL HIP ARTHROPLASTY ANTERIOR APPROACH (Left) Patient reports pain as mild.   Patient is well, and has had no acute complaints or problems Denies any CP, SOB, ABD pain. We will continue therapy today.  Plan is to go Home after hospital stay.  Objective: Vital signs in last 24 hours: Temp:  [98.2 F (36.8 C)-99 F (37.2 C)] 99 F (37.2 C) (03/18 2359) Pulse Rate:  [77-92] 92 (03/18 2359) Resp:  [15-20] 18 (03/18 2359) BP: (113-152)/(50-64) 152/64 (03/18 2359) SpO2:  [95 %-99 %] 99 % (03/18 2359)  Intake/Output from previous day: 03/18 0701 - 03/19 0700 In: 0  Out: 725 [Urine:725] Intake/Output this shift: No intake/output data recorded.  Recent Labs    11/03/19 1730 11/04/19 0545 11/05/19 0430  HGB 12.7* 10.8* 9.9*   Recent Labs    11/04/19 0545 11/05/19 0430  WBC 6.8 8.4  RBC 3.80* 3.49*  HCT 32.6* 29.8*  PLT 140* 130*   Recent Labs    11/04/19 0545 11/05/19 0430  NA 144 144  K 3.6 3.8  CL 111 110  CO2 27 28  BUN 13 13  CREATININE 1.27* 1.30*  GLUCOSE 133* 123*  CALCIUM 8.6* 8.8*   No results for input(s): LABPT, INR in the last 72 hours.  EXAM General - Patient is Alert and Appropriate.  Noted to have slight confusion this morning. Extremity - Neurovascular intact Sensation intact distally Intact pulses distally Dorsiflexion/Plantar flexion intact No cellulitis present Compartment soft Dressing -clean dry and intact.   Motor Function - intact, moving foot and toes well on exam.   Past Medical History:  Diagnosis Date  . Anxiety   . Arthritis   . Hypertension     Assessment/Plan:   3 Days Post-Op Procedure(s) (LRB): TOTAL HIP ARTHROPLASTY ANTERIOR APPROACH (Left) Active Problems:   Status post total hip replacement, left  Estimated body mass index is 28.3 kg/m as calculated from the following:   Height as of this encounter: 5' 6.5" (1.689 m).   Weight as of this encounter: 80.7  kg. Advance diet Up with therapy  Needs BM Labs stable VSS Pain seems to be well controlled.  Noted to have slight confusion this morning. Has mild underlying dementia. Discussed with nurse.  Will hold tramadol and oxycodone.  Tylenol only for pain.  We will continue to monitor.   Plan on discharge to home with HHPT today pending BM.  DVT Prophylaxis - Lovenox, TED hose and SCDs Weight-Bearing as tolerated to left leg   T. Cranston Neighbor, PA-C [facility] 11/06/2019, 7:56 AM

## 2019-11-06 NOTE — Progress Notes (Signed)
Occupational Therapy Treatment Patient Details Name: Carl Figueroa MRN: [id number] DOB: 02/23/1946 Today's Date: 11/06/2019    History of present illness Pt admitted for L THR. HIstory includes depression, HLD, OA, and PTSD.    OT comments  Patient seen this AM.  Supine in bed and agreeable to therapy with encouragement.  Patient remains confused and also with hallucinations (e.g. seeing dogs in the room, thinking his wife is on television), but this is baseline.  Required MOD verbal/tactile cues for sequencing during large tasks such as bed mobility, functional transfers and functional mobility.  Required MIN A physical assist with use of RW.  Partial co-tx with PT this date for optimal performance and safety.  Patient participated in grooming tasks while seated with SBA/SPV; pt requires cues for sequencing and task initiation.  Patient participated well this date.  Upgrading recommendation to [facility] OT with 24/7 SPV for optimal safety.      Follow Up Recommendations  Home health OT;Supervision/Assistance - 24 hour(Wife plans to take pt home.)    Equipment Recommendations  None recommended by OT    Recommendations for Other Services      Precautions / Restrictions Precautions Precautions: Anterior Hip;Fall Restrictions Weight Bearing Restrictions: Yes LLE Weight Bearing: Weight bearing as tolerated       Mobility Bed Mobility Overal bed mobility: Needs Assistance             General bed mobility comments: PT available to cue for compensatory technique and task initiation  Transfers Overall transfer level: Needs assistance   Transfers: Sit to/from Stand Sit to Stand: Min assist         General transfer comment: Requires cues for appropriate body mechanics, sequencing and hand placement    Balance                                           ADL either performed or assessed with clinical judgement   ADL Overall ADL's : Needs assistance/impaired      Grooming: Wash/dry hands;Wash/dry face;Oral care;Supervision/safety;Set up;Sitting Grooming Details (indicate cue type and reason): Requires SPV for accurate sequencing with multistep grooming         Upper Body Dressing : Minimal assistance;Sitting Upper Body Dressing Details (indicate cue type and reason): MIN A for correct identification of sleeves when donning gown                   General ADL Comments: Demonstrated improved participation, however, patient is still confused (baseline dementia)     Vision Baseline Vision/History: Wears glasses Patient Visual Report: No change from baseline     Perception     Praxis      Cognition Arousal/Alertness: Awake/alert Behavior During Therapy: WFL for tasks assessed/performed Overall Cognitive Status: History of cognitive impairments - at baseline Area of Impairment: Orientation;Following commands;Memory                 Orientation Level: Place   Memory: Decreased short-term memory Following Commands: Follows one step commands consistently;Follows one step commands with increased time                Exercises Other Exercises Other Exercises: Patient transitioned to EOB, completed sit to stand with MIN A.  MIN A for SPT to recliner.  Performed grooming tasks while seated in recliner with SPV/SBA.   Shoulder Instructions  General Comments Demonstrated improved participation, but remains confused.  Frequently preoccupied with items he saw on floor.  Needs cues to maintain attention to task as well as for task initiation.    Pertinent Vitals/ Pain       Pain Assessment: No/denies pain  Home Living                                          Prior Functioning/Environment              Frequency  Min 2X/week        Progress Toward Goals  OT Goals(current goals can now be found in the care plan section)  Progress towards OT goals: Progressing toward goals     Plan  Discharge plan needs to be updated;Frequency remains appropriate    Co-evaluation                 AM-PAC OT "6 Clicks" Daily Activity     Outcome Measure   Help from another person eating meals?: None Help from another person taking care of personal grooming?: A Little Help from another person toileting, which includes using toliet, bedpan, or urinal?: A Little Help from another person bathing (including washing, rinsing, drying)?: A Lot Help from another person to put on and taking off regular upper body clothing?: A Little Help from another person to put on and taking off regular lower body clothing?: A Lot 6 Click Score: 17    End of Session Equipment Utilized During Treatment: Gait belt;Rolling walker  OT Visit Diagnosis: Unsteadiness on feet (R26.81);History of falling (Z91.81)   Activity Tolerance Patient tolerated treatment well   Patient Left in chair;with call bell/phone within reach;with chair alarm set   Nurse Communication          Time: [phone] OT Time Calculation (min): 28 min  Charges: OT General Charges $OT Visit: 1 Visit OT Treatments $Self Care/Home Management : 8-22 mins  Baldomero Lamy, MS, OTR/L 11/06/19, 10:50 AM

## 2019-11-16 ENCOUNTER — Telehealth: Payer: Self-pay | Admitting: Adult Health Nurse Practitioner

## 2019-11-16 NOTE — Telephone Encounter (Signed)
Scheduled [facility] Palliative visit for 11-30-19 at 3:00.

## 2019-11-30 ENCOUNTER — Other Ambulatory Visit: Payer: Medicare Other | Admitting: Adult Health Nurse Practitioner

## 2019-11-30 ENCOUNTER — Other Ambulatory Visit: Payer: Self-pay

## 2019-11-30 DIAGNOSIS — Z515 Encounter for palliative care: Secondary | ICD-10-CM

## 2019-11-30 DIAGNOSIS — R413 Other amnesia: Secondary | ICD-10-CM

## 2019-11-30 NOTE — Progress Notes (Signed)
Therapist, nutritional Palliative Care Consult Note Telephone: [phone]  Fax: [phone]  PATIENT NAME: Carl Figueroa DOB: [birth year] MRN: [id number]  PRIMARY CARE PROVIDER:   Mick Sell, MD  REFERRING PROVIDER:  Mick Sell, MD [address] [place],  [place] [address]  RESPONSIBLE PARTY:   Self and wife, Carl Figueroa  [phone] Wife's cell [phone]    RECOMMENDATIONS and PLAN:  1.  Advanced care planning.Patient is full code.  2.  OA.  Patient underwent left hip arthroplasty, anterior approach on 11/03/19.  He did have bad dreams after surgery due to the anesthesia.  They had a lady from the church who was able to help them in the home so that the patient was able to recover and receive therapy at home.  He has done well and is getting around with a cane.  He is able to perform ADLs independently and help with light house cleaning.  He does still have pain that starts in left hip and radiates into back of left thigh.  States that it is worse when he is lying in bed.  He does take tramadol and naproxen that helps relieve some of the pain.  Have suggested gabapentin just at night since that seems to be when the pain bothers him the most but do have concerns with this over sedating him.  Wife does not want to add any other medications at this time.  He also use Voltaren gel and lidocaine 4% cream that helps with the pain. He does state that he gets relief when the weather is warmer and have suggested that he could use a heating pad on the hip since the warmth helps ease the pain.  He is also having pain in his right shoulder.  Has upcoming appointment with orthopedic surgeon and wife states that she will let them know about his shoulder pain.   Will forward note to surgeon as well.  Overall patient is doing well after surgery.  Wife states that his appetite was not good at first but is now picking up.  Has had occasional constipation  relieved with Dulcolax and MOM.  Last BM today. Denies SOB, cough, fever, N/V/D, chest pain, headaches, dizziness.  Palliative will continue to monitor for symptom management and make recommendations as needed.  Will call in 6-8 weeks for follow up visit.  Encouraged to call with any concerns.  I spent 60 minutes providing this consultation,  from 3:00 to 4:00 including time with patient/family, chart review, provider coordination, documentation. More than 50% of the time in this consultation was spent coordinating communication.   HISTORY OF PRESENT ILLNESS:  Carl Figueroa is a 74 y.o. year old male with multiple medical problems including memory loss or impairment, HTN, OA. Palliative Care was asked to help address goals of care.   CODE STATUS: full code  PPS: 60% HOSPICE ELIGIBILITY/DIAGNOSIS: TBD  PHYSICAL EXAM:  BP 110/62  HR  85  O2 98% on RA General: sitting in chair in NAD Cardiovascular: regular rate and rhythm Pulmonary: lung sounds clear; normal respiratory effort Abdomen: soft, nontender, + bowel sounds GU: no suprapubic tenderness Extremities: no edema, no joint deformities Skin: no rashes Neurological: Weakness but otherwise nonfocal  PAST MEDICAL HISTORY:  Past Medical History:  Diagnosis Date  . Anxiety   . Arthritis   . Hypertension     SOCIAL HX:  Social History   Tobacco Use  . Smoking status: Former Smoker  Packs/day: 0.25    Years: 20.00    Pack years: 5.00    Types: Cigarettes    Quit date: 10/28/2019    Years since quitting: 0.0  . Smokeless tobacco: Never Used  Substance Use Topics  . Alcohol use: No    ALLERGIES:  Allergies  Allergen Reactions  . Morphine Nausea Only     PERTINENT MEDICATIONS:  Outpatient Encounter Medications as of 11/30/2019  Medication Sig  . amLODipine (NORVASC) 5 MG tablet Take 1 tablet (5 mg total) by mouth daily. (Patient taking differently: Take 5 mg by mouth every morning. )  . atorvastatin (LIPITOR) 40 MG  tablet Take 1 tablet (40 mg total) by mouth daily. (Patient taking differently: Take 40 mg by mouth every morning. )  . clonazePAM (KLONOPIN) 0.5 MG tablet Take 0.5 tablets (0.25 mg total) by mouth 3 (three) times daily. (Patient taking differently: Take 0.25 mg by mouth at bedtime. )  . docusate sodium (COLACE) 100 MG capsule Take 1 capsule (100 mg total) by mouth 2 (two) times daily.  Marland Kitchen donepezil (ARICEPT) 10 MG tablet Take 10 mg by mouth every morning.   . enoxaparin (LOVENOX) 40 MG/0.4ML injection Inject 0.4 mLs (40 mg total) into the skin daily for 14 days.  Marland Kitchen lisinopril (ZESTRIL) 40 MG tablet Take 20 mg by mouth every morning.   . methocarbamol (ROBAXIN) 500 MG tablet Take 1 tablet (500 mg total) by mouth every 6 (six) hours as needed for muscle spasms.  Marland Kitchen oxyCODONE (OXY IR/ROXICODONE) 5 MG immediate release tablet Take 1-2 tablets (5-10 mg total) by mouth every 4 (four) hours as needed for moderate pain (pain score 4-6).  Marland Kitchen venlafaxine XR (EFFEXOR-XR) 150 MG 24 hr capsule Take 300 mg by mouth daily with breakfast.    No facility-administered encounter medications on file as of 11/30/2019.    PHYSICAL EXAM:   General: NAD, frail appearing, thin Cardiovascular: regular rate and rhythm Pulmonary: clear ant fields Abdomen: soft, nontender, + bowel sounds GU: no suprapubic tenderness Extremities: no edema, no joint deformities Skin: no rashes Neurological: Weakness but otherwise nonfocal  Margrit Minner Jenetta Downer, NP

## 2020-07-26 ENCOUNTER — Ambulatory Visit: Payer: Medicare Other

## 2020-07-28 ENCOUNTER — Ambulatory Visit: Payer: Medicare Other | Attending: Internal Medicine

## 2020-07-28 DIAGNOSIS — Z23 Encounter for immunization: Secondary | ICD-10-CM

## 2020-07-28 NOTE — Progress Notes (Signed)
   Covid-19 Vaccination Clinic  Name:  Carl Figueroa    MRN: [id number] DOB: Jun 18, 1946  07/28/2020  Carl Figueroa was observed post Covid-19 immunization for 15 minutes without incident. He was provided with Vaccine Information Sheet and instruction to access the V-Safe system.   Carl Figueroa was instructed to call 911 with any severe reactions post vaccine: Marland Kitchen Difficulty breathing  . Swelling of face and throat  . A fast heartbeat  . A bad rash all over body  . Dizziness and weakness   Immunizations Administered    Name Date Dose VIS Date Route   Pfizer COVID-19 Vaccine 07/28/2020  4:26 PM 0.3 mL 06/08/2020 Intramuscular   Manufacturer: [employer], [employer]   Lot: [id number]   NDC: [address]-5

## 2020-10-11 ENCOUNTER — Telehealth: Payer: Self-pay

## 2020-10-11 NOTE — Telephone Encounter (Signed)
Volunteer called patient on behalf of Palliative Care. Patient is doing well at this time.  

## 2020-11-08 ENCOUNTER — Other Ambulatory Visit: Payer: Self-pay | Admitting: Orthopedic Surgery

## 2020-11-08 DIAGNOSIS — M5416 Radiculopathy, lumbar region: Secondary | ICD-10-CM

## 2020-11-19 ENCOUNTER — Ambulatory Visit
Admission: RE | Admit: 2020-11-19 | Discharge: 2020-11-19 | Disposition: A | Discharge status: Home / Self Care | Payer: Medicare Other | Source: Ambulatory Visit | Attending: Orthopedic Surgery | Admitting: Orthopedic Surgery

## 2020-11-19 ENCOUNTER — Other Ambulatory Visit: Payer: Self-pay

## 2020-11-19 DIAGNOSIS — M5416 Radiculopathy, lumbar region: Secondary | ICD-10-CM | POA: Insufficient documentation

## 2020-11-19 IMAGING — MR MR LUMBAR SPINE W/O CM
5 series · 31 of 48 positions shown · non-contrast
Comparison: None.

CLINICAL DATA: Low back pain with bilateral hip pain

EXAM:
MRI LUMBAR SPINE WITHOUT CONTRAST
TECHNIQUE: Multiplanar, multisequence MR imaging of the lumbar spine was
performed. No intravenous contrast was administered.

[Series 12: T2 · sagittal · 4.0mm · 0.81mm/px · 6 of 17 slices shown (1 of 2)]
[im 1/17]
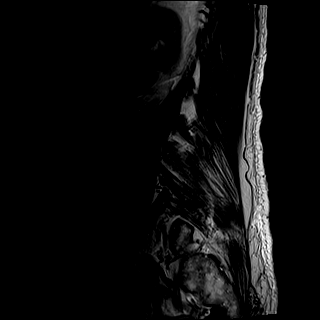
[im 4/17]
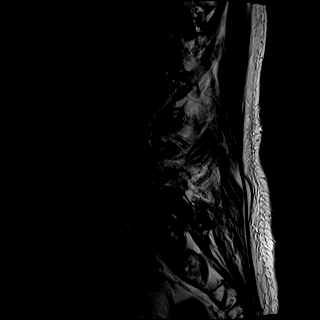
[im 7/17]
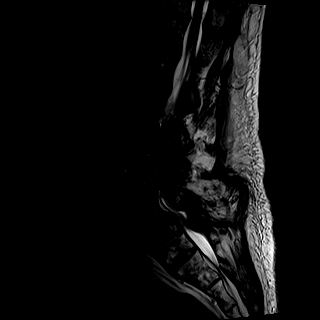
[im 10/17]
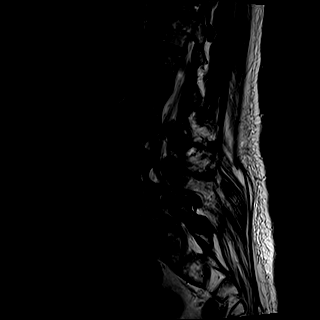
[im 13/17]
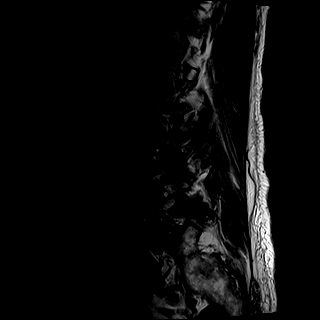
[im 17/17]
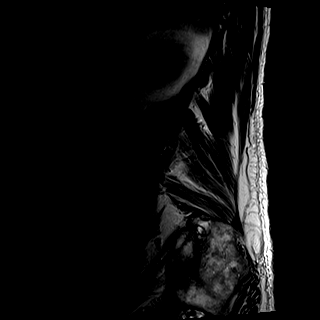

[Series 13: STIR · sagittal · 4.0mm · 0.41mm/px · 2 of 17 slices shown]
[im 1/17]
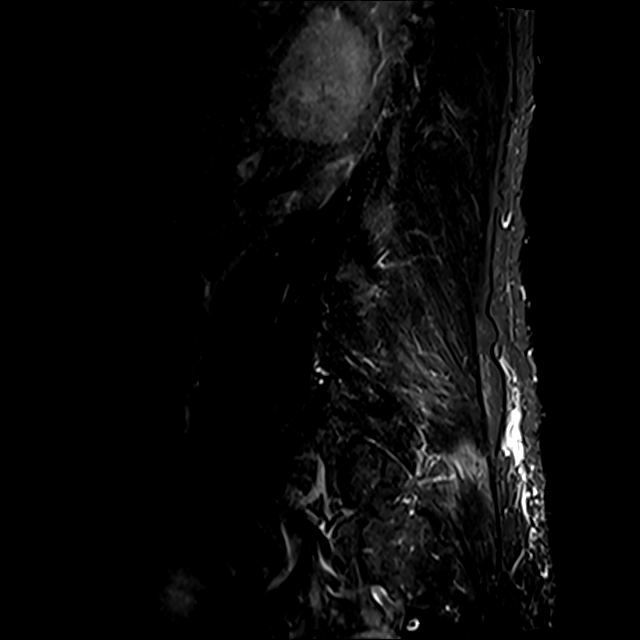
[im 3/17]
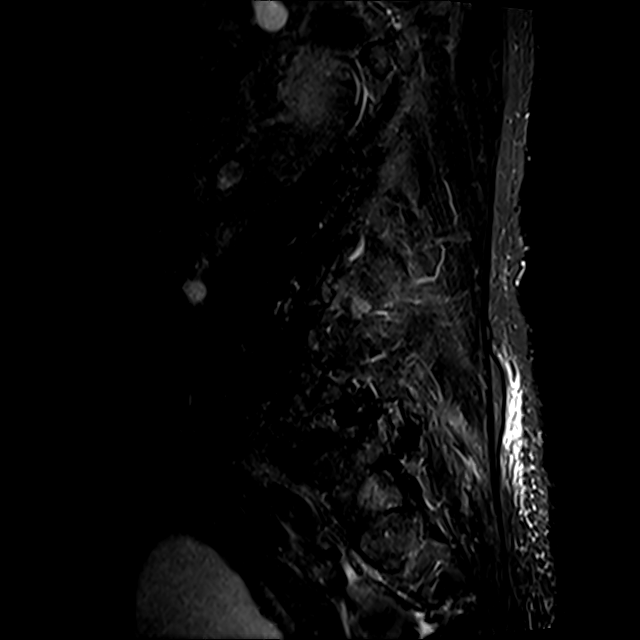

[Series 14: T1 · sagittal · 4.0mm · 0.81mm/px · 7 of 17 slices shown (1 of 2)]
[im 1/17]
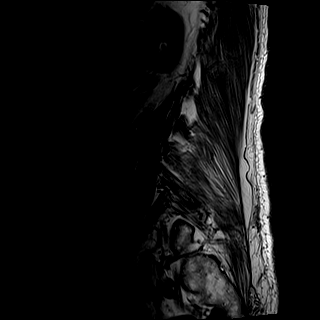
[im 3/17]
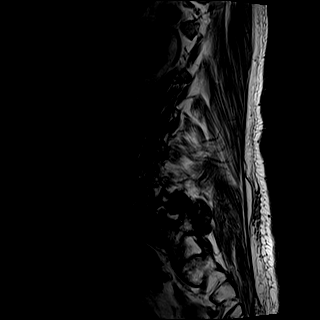
[im 6/17]
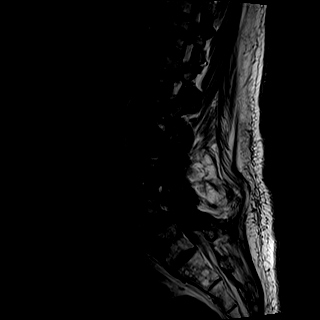
[im 9/17]
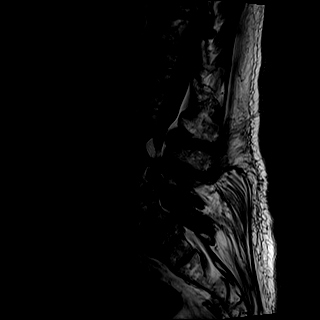
[im 11/17]
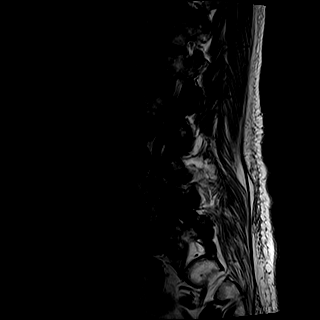
[im 14/17]
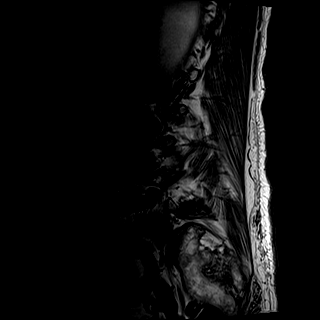
[im 17/17]
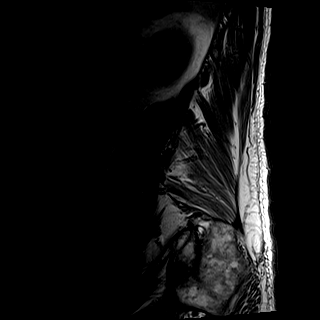

[Series 17: T2 · axial · 4.0mm · 0.78mm/px · z∈[-96,+111]mm · 8 of 36 slices shown (2 of 2)]
[im 1/36]
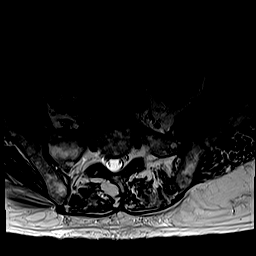
[im 6/36]
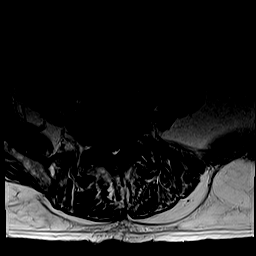
[im 11/36]
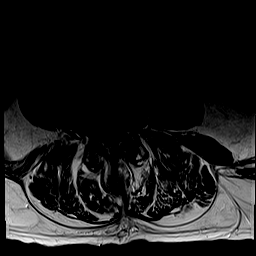
[im 17/36]
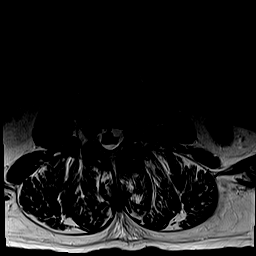
[im 19/36]
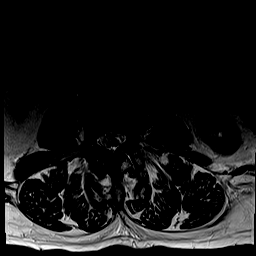
[im 25/36]
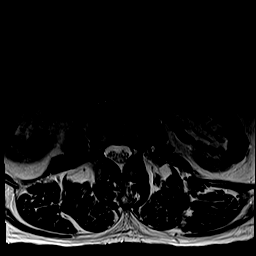
[im 30/36]
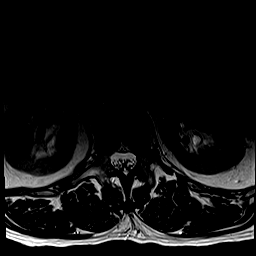
[im 36/36]
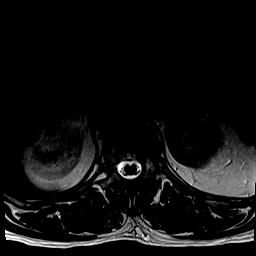

[Series 20: T1 · axial · 4.0mm · 0.39mm/px · z∈[-96,+116]mm · 8 of 36 slices shown (2 of 2)]
[im 1/36]
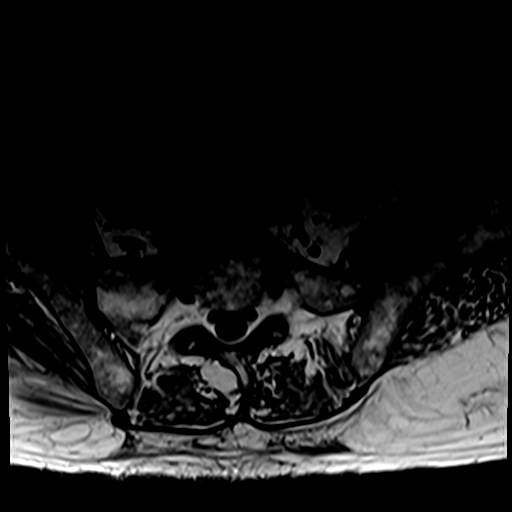
[im 6/36]
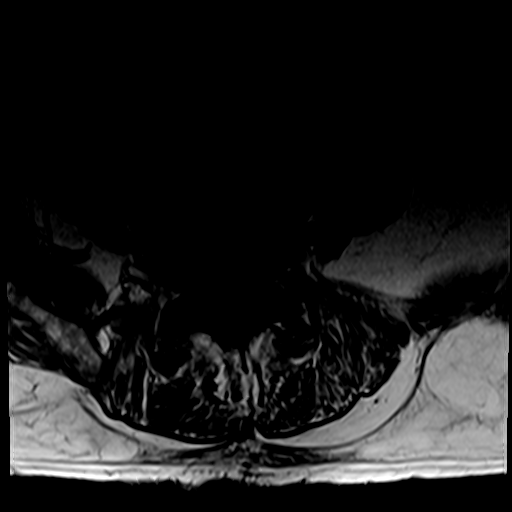
[im 11/36]
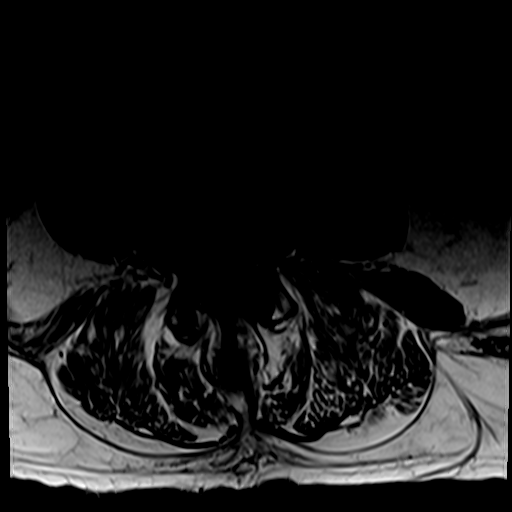
[im 17/36]
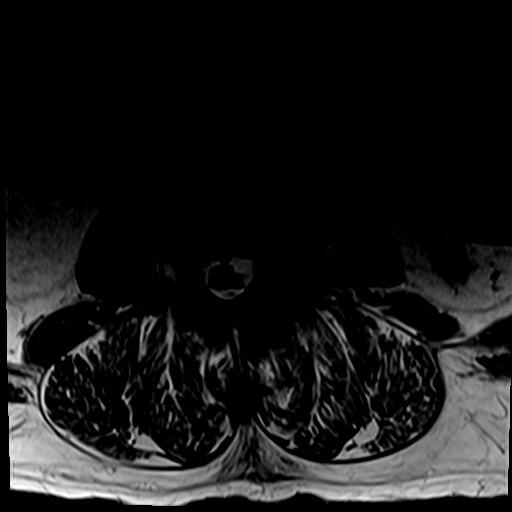
[im 19/36]
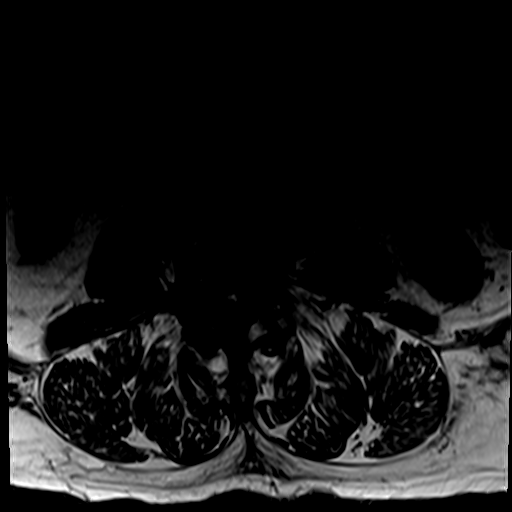
[im 25/36]
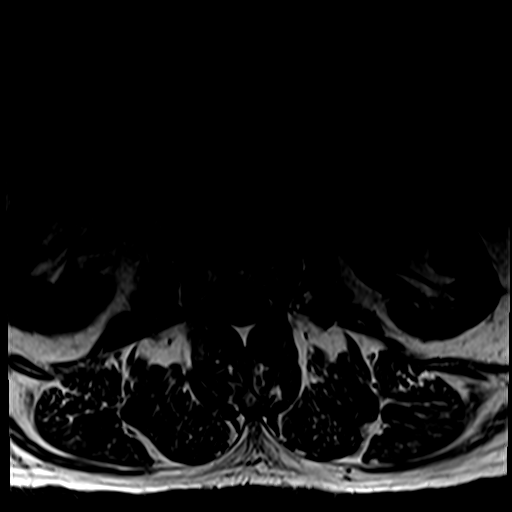
[im 30/36]
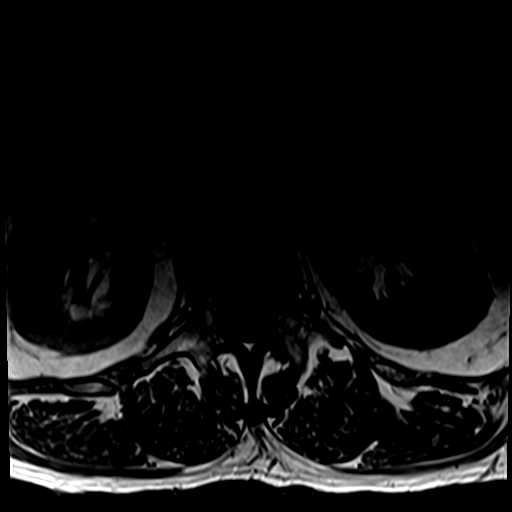
[im 36/36]
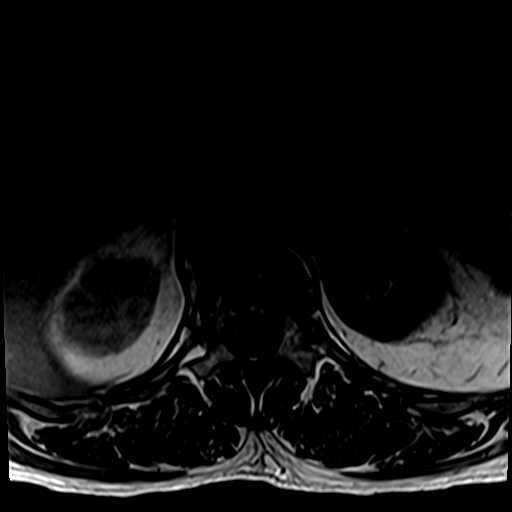

[31 of 48 positions shown; findings below may reference images not displayed]

FINDINGS: Segmentation: Normal segmentation. S1 appears to be partially
lumbarized.

Alignment: 4 mm retrolisthesis L2-3. 7 mm anterolisthesis L4-5. 4 mm
anterolisthesis L5-S1.

Mild lumbar scoliosis.

Vertebrae: Negative for fracture or mass. Asymmetric discogenic
edema on the left at L3-4 related to scoliosis and disc
degeneration.

Conus medullaris and cauda equina: Conus extends to the L1-2 level.
Conus and cauda equina appear normal.

Paraspinal and other soft tissues: Negative for paraspinous mass or
adenopathy.

Disc levels:

T11-12: Disc bulging and facet degeneration. Left foraminal
stenosis. Axial images not included through this level

T12-L1: Mild disc and facet degeneration.  Negative for stenosis

L1-2: Mild facet degeneration.  Negative for stenosis

L2-3: 4 mm retrolisthesis with diffuse disc bulging and endplate
spurring. Moderate facet degeneration. Moderate to severe spinal
stenosis. Moderate subarticular and foraminal stenosis bilaterally

L3-4: Disc degeneration asymmetric to the left. Diffuse endplate
spurring and moderate facet hypertrophy bilaterally. Severe spinal
stenosis. Moderate to severe subarticular and foraminal stenosis
left greater than right.

L4-5: 7 mm anterolisthesis with severe facet degeneration. Diffuse
disc bulging and endplate spurring. Severe spinal stenosis. Severe
subarticular and foraminal stenosis. Compression of the L4 nerve
root in the foramen bilaterally. Bilateral L5 nerve root
impingement.

L5-S1: 4 mm anterolisthesis with diffuse disc bulging and severe
facet degeneration. Severe spinal stenosis with moderate to severe
subarticular and foraminal stenosis bilaterally.
IMPRESSION: Multilevel degenerative change throughout the lumbar spine.
Multilevel severe spinal and foraminal stenosis as described above.

## 2021-01-04 ENCOUNTER — Telehealth: Payer: Self-pay

## 2021-01-04 NOTE — Telephone Encounter (Signed)
Volunteer called patient/family on behalf of [facility]. Patient is doing well at this time.  

## 2021-08-03 ENCOUNTER — Ambulatory Visit (INDEPENDENT_AMBULATORY_CARE_PROVIDER_SITE_OTHER): Payer: Medicare Other | Admitting: Neurology

## 2021-08-03 ENCOUNTER — Other Ambulatory Visit: Payer: Self-pay

## 2021-08-03 ENCOUNTER — Encounter: Payer: Self-pay | Admitting: Neurology

## 2021-08-03 VITALS — BP 141/81 | HR 71 | Ht 66.5 in | Wt 169.5 lb

## 2021-08-03 DIAGNOSIS — R269 Unspecified abnormalities of gait and mobility: Secondary | ICD-10-CM

## 2021-08-03 DIAGNOSIS — R413 Other amnesia: Secondary | ICD-10-CM

## 2021-08-03 DIAGNOSIS — N529 Male erectile dysfunction, unspecified: Secondary | ICD-10-CM | POA: Insufficient documentation

## 2021-08-03 DIAGNOSIS — M2140 Flat foot [pes planus] (acquired), unspecified foot: Secondary | ICD-10-CM | POA: Insufficient documentation

## 2021-08-03 DIAGNOSIS — R251 Tremor, unspecified: Secondary | ICD-10-CM | POA: Diagnosis not present

## 2021-08-03 DIAGNOSIS — F4312 Post-traumatic stress disorder, chronic: Secondary | ICD-10-CM | POA: Insufficient documentation

## 2021-08-03 DIAGNOSIS — H401110 Primary open-angle glaucoma, right eye, stage unspecified: Secondary | ICD-10-CM | POA: Insufficient documentation

## 2021-08-03 MED ORDER — MEMANTINE HCL 10 MG PO TABS: 10.0000 mg | ORAL_TABLET | Freq: Two times a day (BID) | ORAL | 11 refills | Status: DC

## 2021-08-03 NOTE — Progress Notes (Signed)
Chief Complaint  Patient presents with   New Patient (Initial Visit)    Rm 12. Accompanied by wife. New Patient/Paper/PCP is Angus Palms, PA-C. Pt c/o tremors in bilateral hands, worse in right hand.      ASSESSMENT AND PLAN  Carl Figueroa is a 75 y.o. male   Dementia Tremor  There was no significant parkinsonian features, he does not know his family history, the tremor is mostly action component, suggestive of essential tremor  MRI of the brain showed no acute abnormality,  Laboratory evaluation to rule out treatable etiology including thyroid functional test  Add on Namenda 10 mg twice a day Gait abnormality  Multifactorial, deconditioning, joint pain,  Referred to physical therapy  DIAGNOSTIC DATA (LABS, IMAGING, TESTING) - I reviewed patient records, labs, notes, testing and imaging myself where available.   MEDICAL HISTORY:  Carl Figueroa is a 75 year old male, seen in request by his primary care PA Angus Palms A, for evaluation of tremor, memory loss, initial evaluation was on August 03, 2021 with his wife  I reviewed and summarized the referring note. PMHX HTN HLD Chronic insomnia Depression, Anxiety Memory loss Left hip replacment Hx of left foot surgery  He will reported long history of PTSD, depression anxiety, is at the treatment of [facility], taking Effexor 300 mg daily, clonazepam as needed  He retired from post office at age 31, had more than 5 years history of gradual onset bilateral hands tremor, involving both hands, he could no longer using tools such as screwdriver, difficulty holding utensils,  He denied loss sense of smell, has difficulty sleeping, frequent flashback, PTSD, moves a lot during sleep, sometimes even jump out of the bed from his nightmare,  He has gait abnormality, he contributed to his hip pain, chronic knee pain  He also was noted to have gradual worsening memory loss since 2017, MoCA examination 14/30  today,  I personally reviewed MRI of the brain without contrast December 2020, no acute abnormality, age-appropriate generalized atrophy  MRI of lumbar spine April 2022, multilevel degenerative changes, moderate to severe spinal stenosis L2-3, severe spinal stenosis at L3-4, L4-5, severe subarticular and foraminal stenosis, compression of L4 nerve roots and bilateral foraminal  PHYSICAL EXAM:   Vitals:   08/03/21 0903  BP: (!) 141/81  Pulse: 71  Weight: 169 lb 8 oz (76.9 kg)  Height: 5' 6.5" (1.689 m)   Not recorded     Body mass index is 26.95 kg/m.  PHYSICAL EXAMNIATION:  Gen: NAD, conversant, well nourised, well groomed                     Cardiovascular: Regular rate rhythm, no peripheral edema, warm, nontender. Eyes: Conjunctivae clear without exudates or hemorrhage Neck: Supple, no carotid bruits. Pulmonary: Clear to auscultation bilaterally   NEUROLOGICAL EXAM:  MENTAL STATUS: Speech:    Speech is normal; fluent and spontaneous with normal comprehension.  Cognition: Montreal Cognitive Assessment  08/03/2021  Visuospatial/ Executive (0/5) 3  Naming (0/3) 3  Attention: Read list of digits (0/2) 1  Attention: Read list of letters (0/1) 1  Attention: Serial 7 subtraction starting at 100 (0/3) 0  Language: Repeat phrase (0/2) 1  Language : Fluency (0/1) 1  Abstraction (0/2) 1  Delayed Recall (0/5) 0  Orientation (0/6) 3  Total 14  Adjusted Score (based on education) 14      CRANIAL NERVES: CN II: Visual fields are full to confrontation. Pupils are round equal and  briskly reactive to light. CN III, IV, VI: extraocular movement are normal. No ptosis. CN V: Facial sensation is intact to light touch CN VII: Face is symmetric with normal eye closure  CN VIII: Hearing is normal to causal conversation. CN IX, X: Phonation is normal. CN XI: Head turning and shoulder shrug are intact  MOTOR: Bilateral hand posturing tremor, normal strength, no significant  rigidity, bradykinesia, significant bilateral hands tremor drawing spiral circles  REFLEXES: Reflexes are 2+ and symmetric at the biceps, triceps, knees, and ankles. Plantar responses are flexor.  SENSORY: Intact to light touch, pinprick and vibratory sensation are intact in fingers and toes.  COORDINATION: There is no trunk or limb dysmetria noted.  GAIT/STANCE: He needs push-up to get up from seated position, antalgic, wide-based, cautious mildly unsteady  REVIEW OF SYSTEMS:  Full 14 system review of systems performed and notable only for as above All other review of systems were negative.   ALLERGIES: Allergies  Allergen Reactions   Morphine Nausea Only    HOME MEDICATIONS: Current Outpatient Medications  Medication Sig Dispense Refill   amLODipine (NORVASC) 5 MG tablet Take 1 tablet (5 mg total) by mouth daily. (Patient taking differently: Take 5 mg by mouth every morning.) 30 tablet 1   clonazePAM (KLONOPIN) 0.5 MG tablet Take 0.5 tablets (0.25 mg total) by mouth 3 (three) times daily. (Patient taking differently: Take 0.25 mg by mouth at bedtime.) 12 tablet 0   docusate sodium (COLACE) 100 MG capsule Take 1 capsule (100 mg total) by mouth 2 (two) times daily. 10 capsule 0   donepezil (ARICEPT) 10 MG tablet Take 10 mg by mouth every morning.      lisinopril (ZESTRIL) 40 MG tablet Take 20 mg by mouth every morning.      methocarbamol (ROBAXIN) 500 MG tablet Take 1 tablet (500 mg total) by mouth every 6 (six) hours as needed for muscle spasms. 30 tablet 0   oxyCODONE (OXY IR/ROXICODONE) 5 MG immediate release tablet Take 1-2 tablets (5-10 mg total) by mouth every 4 (four) hours as needed for moderate pain (pain score 4-6). 40 tablet 0   venlafaxine XR (EFFEXOR-XR) 150 MG 24 hr capsule Take 300 mg by mouth daily with breakfast.      atorvastatin (LIPITOR) 40 MG tablet Take 1 tablet (40 mg total) by mouth daily. (Patient taking differently: Take 40 mg by mouth every morning. ) 30  tablet 1   enoxaparin (LOVENOX) 40 MG/0.4ML injection Inject 0.4 mLs (40 mg total) into the skin daily for 14 days. 5.6 mL 0   No current facility-administered medications for this visit.    PAST MEDICAL HISTORY: Past Medical History:  Diagnosis Date   Anxiety    Arthritis    Hypertension     PAST SURGICAL HISTORY: Past Surgical History:  Procedure Laterality Date   COLONOSCOPY     FOOT SURGERY     X2   HERNIA REPAIR Right    INGUINAL    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   Procedure: TOTAL HIP ARTHROPLASTY ANTERIOR APPROACH;  Surgeon: Kennedy Bucker, MD;  Location: [facility] ORS;  Service: Orthopedics;  Laterality: Left;    FAMILY HISTORY: Family History  Problem Relation Age of Onset   Cancer Mother    Diabetes Mellitus II Neg Hx     SOCIAL HISTORY: Social History   Socioeconomic History   Marital status: Married    Spouse name: Not on file   Number of children: Not on file   Years  of education: Not on file   Highest education level: Not on file  Occupational History   Not on file  Tobacco Use   Smoking status: Former    Packs/day: 0.25    Years: 20.00    Pack years: 5.00    Types: Cigarettes    Quit date: 10/28/2019    Years since quitting: 1.7   Smokeless tobacco: Never  Vaping Use   Vaping Use: Never used  Substance and Sexual Activity   Alcohol use: No   Drug use: No   Sexual activity: Not on file  Other Topics Concern   Not on file  Social History Narrative   Not on file   Social Determinants of Health   Financial Resource Strain: Not on file  Food Insecurity: Not on file  Transportation Needs: Not on file  Physical Activity: Not on file  Stress: Not on file  Social Connections: Not on file  Intimate Partner Violence: Not on file      Levert Feinstein, M.D. Ph.D.  [facility] [address] [place], [place] [address] Ph: [phone] Fax: [phone]  CC:  Rayetta Humphrey, PA-C [address] [place],   [place] [address]  Rayetta Humphrey, PA-C

## 2021-08-04 LAB — CBC WITH DIFFERENTIAL
Basophils Absolute: 0 10*3/uL (ref 0.0–0.2)
Basos: 0 %
EOS (ABSOLUTE): 0.1 10*3/uL (ref 0.0–0.4)
Eos: 2 %
Hematocrit: 41.6 % (ref 37.5–51.0)
Hemoglobin: 13.5 g/dL (ref 13.0–17.7)
Immature Grans (Abs): 0 10*3/uL (ref 0.0–0.1)
Immature Granulocytes: 0 %
Lymphocytes Absolute: 1.1 10*3/uL (ref 0.7–3.1)
Lymphs: 23 %
MCH: 28.1 pg (ref 26.6–33.0)
MCHC: 32.5 g/dL (ref 31.5–35.7)
MCV: 87 fL (ref 79–97)
Monocytes Absolute: 0.5 10*3/uL (ref 0.1–0.9)
Monocytes: 10 %
Neutrophils Absolute: 3.1 10*3/uL (ref 1.4–7.0)
Neutrophils: 65 %
RBC: 4.81 x10E6/uL (ref 4.14–5.80)
RDW: 12.9 % (ref 11.6–15.4)
WBC: 4.8 10*3/uL (ref 3.4–10.8)

## 2021-08-04 LAB — COMPREHENSIVE METABOLIC PANEL
ALT: 6 IU/L (ref 0–44)
AST: 11 IU/L (ref 0–40)
Albumin/Globulin Ratio: 1.9 (ref 1.2–2.2)
Albumin: 4.3 g/dL (ref 3.7–4.7)
Alkaline Phosphatase: 89 IU/L (ref 44–121)
BUN/Creatinine Ratio: 9 — ABNORMAL LOW (ref 10–24)
BUN: 11 mg/dL (ref 8–27)
Bilirubin Total: 0.6 mg/dL (ref 0.0–1.2)
CO2: 25 mmol/L (ref 20–29)
Calcium: 10 mg/dL (ref 8.6–10.2)
Chloride: 109 mmol/L — ABNORMAL HIGH (ref 96–106)
Creatinine, Ser: 1.29 mg/dL — ABNORMAL HIGH (ref 0.76–1.27)
Globulin, Total: 2.3 g/dL (ref 1.5–4.5)
Glucose: 84 mg/dL (ref 70–99)
Potassium: 4 mmol/L (ref 3.5–5.2)
Sodium: 148 mmol/L — ABNORMAL HIGH (ref 134–144)
Total Protein: 6.6 g/dL (ref 6.0–8.5)
eGFR: 58 mL/min/{1.73_m2} — ABNORMAL LOW (ref >59)

## 2021-08-04 LAB — VITAMIN B12: Vitamin B-12: 349 pg/mL (ref 232–1245)

## 2021-08-04 LAB — TSH: TSH: 1.56 u[IU]/mL (ref 0.450–4.500)

## 2021-08-04 LAB — RPR: RPR Ser Ql: NONREACTIVE

## 2021-08-29 ENCOUNTER — Telehealth: Payer: Self-pay | Admitting: Neurology

## 2021-08-29 NOTE — Telephone Encounter (Signed)
[facility] (Janet)calling in regards to referral sent for rehabilitation. Pt's wife said [employer] should cover the cost for rehab. [facility] should have sent the paperwork [facility]. Would like a call from the nurse.

## 2021-08-30 NOTE — Telephone Encounter (Signed)
I checked with Pod 2 and Debra to verify if we had received documentation on this. We have not.  ------------------------------------------------------------  I called [facility] and spoke with Angie. She sts the referral from 08/03/21 was received for physical therapy evaluation/treatment but in order to schedule the pt the referral would need to be made from the [place] would need to make the referral. She sts by the [facility] making the referral the claim for payment will be accepted. She advised the pt would need to call the [facility] to have this sent to them.  --------------------------------------------------------------- I caled the pt and spoke with pt's wife Fannie Knee, ok per dpr) and relayed information from [employer]. She verbalized understanding and sts she will work on this today for the pt. ---------------------------------------------------------------  Nothing from our office needed at this time.

## 2021-09-11 ENCOUNTER — Ambulatory Visit: Payer: Medicare Other | Admitting: Physical Therapy

## 2021-09-14 ENCOUNTER — Encounter: Payer: Self-pay | Admitting: Physical Therapy

## 2021-09-14 ENCOUNTER — Ambulatory Visit: Payer: Medicare Other | Attending: Neurology | Admitting: Physical Therapy

## 2021-09-14 ENCOUNTER — Telehealth: Payer: Self-pay | Admitting: Neurology

## 2021-09-14 ENCOUNTER — Other Ambulatory Visit: Payer: Self-pay

## 2021-09-14 DIAGNOSIS — R413 Other amnesia: Secondary | ICD-10-CM | POA: Diagnosis not present

## 2021-09-14 DIAGNOSIS — M6281 Muscle weakness (generalized): Secondary | ICD-10-CM | POA: Insufficient documentation

## 2021-09-14 DIAGNOSIS — R251 Tremor, unspecified: Secondary | ICD-10-CM | POA: Diagnosis not present

## 2021-09-14 DIAGNOSIS — R262 Difficulty in walking, not elsewhere classified: Secondary | ICD-10-CM | POA: Diagnosis present

## 2021-09-14 DIAGNOSIS — R2681 Unsteadiness on feet: Secondary | ICD-10-CM | POA: Diagnosis present

## 2021-09-14 NOTE — Therapy (Signed)
[place]. [place], [place], [address] Phone: [phone]   Fax:  [phone]  Physical Therapy Evaluation  Patient Details  Name: Carl Figueroa MRN: [id number] Date of Birth: 1946/03/10 Referring Provider (PT): Carl Figueroa   Encounter Date: 09/14/2021   PT End of Session - 09/14/21 1626     Visit Number 1    Number of Visits 17    Date for PT Re-Evaluation 11/09/21    Authorization Type M[employer] and [employer]    Authorization Time Period 09/14/21 to 10/19/21    Progress Note Due on Visit 10    PT Start Time [id number]    PT Stop Time [id number]    PT Time Calculation (min) 43 min    Activity Tolerance Patient tolerated treatment well    Behavior During Therapy [facility] for tasks assessed/performed             Past Medical History:  Diagnosis Date   Anxiety    Arthritis    Hypertension     Past Surgical History:  Procedure Laterality Date   COLONOSCOPY     FOOT SURGERY     X2   HERNIA REPAIR Right    INGUINAL    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   Procedure: TOTAL HIP ARTHROPLASTY ANTERIOR APPROACH;  Surgeon: Carl Knows, MD;  Location: [facility] ORS;  Service: Orthopedics;  Laterality: Left;    There were no vitals filed for this visit.    Subjective Assessment - 09/14/21 1534     Subjective I feel like I could do better if I were exercising more- I get tired when I'm trying to use that right arm, I have a hard time walking. No falls or close calls with falling. I feel like I'm dragging. I also have problems with my right hand. If I'm holding a glass of water in my right hand, it will splash everywhere. If I'm sitting in church or something like that, my neck will start shaking. No parkinsons that I know of. I have an ear infection, when I walk its not the same as when I don't have an ear infection; the MD gave me a drug to help. My right arm has a mind of its own.    Patient Stated Goals get going moving a  little better, reduce tremor as much as possible    Currently in Pain? No/denies                [facility] PT Assessment - 09/14/21 0001       Assessment   Medical Diagnosis tremor/memory loss    Referring Provider (PT) Carl Figueroa    Onset Date/Surgical Date --   chronic/ongoing   Next MD Visit unsure    Prior Therapy PT in the past post-op [facility]      Precautions   Precautions Fall      Restrictions   Weight Bearing Restrictions No      Balance Screen   Has the patient fallen in the past 6 months No    Has the patient had a decrease in activity level because of a fear of falling?  Yes    Is the patient reluctant to leave their home because of a fear of falling?  No      Home Environment   Living Environment Private residence      Prior Function   Level of Independence Requires assistive device for independence    Vocation Retired  Leisure playing chess, cleaning/washing/dishes      Posture/Postural Control   Posture Comments very stiff, tight hamstrings and hip flexors, plantar flexors, lumbar spine      ROM / Strength   AROM / PROM / Strength Strength      Strength   Strength Assessment Site Hip;Knee;Ankle    Right/Left Hip Left;Right    Right Hip Flexion 3+/5    Right Hip ABduction 2+/5    Left Hip Flexion 3+/5    Right/Left Knee Right;Left    Right Knee Flexion 4/5    Right Knee Extension 4+/5    Left Knee Flexion 4/5    Left Knee Extension 4+/5    Right/Left Ankle Right;Left    Right Ankle Dorsiflexion 4+/5    Left Ankle Dorsiflexion 4+/5      6 minute walk test results    Endurance additional comments 35ft with SPC, had to stop and rest at 2 min 30 seconds      Standardized Balance Assessment   Standardized Balance Assessment Berg Balance Test              Orthostatics:  Supine 127/71 HR 56 Sitting 118/74 HR 74 Standing 104/72 HR 78 Standing x3 minutes 101/68  HR 83          Objective measurements completed on examination:  See above findings.                PT Education - 09/14/21 1626     Education Details exam findings, POC, benefit of working with OT for UE concerns    Person(s) Educated Patient    Methods Explanation    Comprehension Verbalized understanding              PT Short Term Goals - 09/14/21 1629       PT SHORT TERM GOAL #1   Title Will be compliant with appropriate progressive HEP    Time 4    Period Weeks    Status New    Target Date 10/12/21      PT SHORT TERM GOAL #2   Title Will be able to ambulate at least 516ft during with LRAD to show improved mobility and functional activity tolerance    Time 4    Period Weeks    Status New      PT SHORT TERM GOAL #3   Title Will improve step/stride length, toe clearance, and upright posture to show improved gait pattern/mobility    Time 4    Period Weeks    Status New      PT SHORT TERM GOAL #4   Title Will show at least 50% improvement in hamstring and hip flexor tightness in order to allow him to have more upright posture    Time 4    Period Weeks    Status New               PT Long Term Goals - 09/14/21 1633       PT LONG TERM GOAL #1   Title MMT to have improved by at least 1 grade in all weak groups    Time 8    Period Weeks    Status New    Target Date 11/09/21      PT LONG TERM GOAL #2   Title Will score at least 45 on the Berg balance test in order to show reduced fall risk    Time 8    Period Weeks  Status New      PT LONG TERM GOAL #3   Title Will be able to ambulate at least 1073ft in 6MWT with LRAD to show improved mobility and better community access    Time 8    Period Weeks    Status New      PT LONG TERM GOAL #4   Title Will be able to tolerate at least 15 minutes of machine (Nustep, reclining bike, etc) based exercise without increased fatigue to show improved functional activity tolerance    Time 8    Period Weeks    Status New      PT LONG TERM GOAL #5   Title  Will participate in gym or community based exercise program in order to maintain functional gains and prevent recurrence of symptoms    Time 8    Period Weeks    Status New                    Plan - 09/14/21 1627     Clinical Impression Statement Carl Figueroa arrives today doing well; tells me he has concerns about his tremor and that he would feel better in general if he started exercising. Also concerned about RUE weakness and tremor, also grip strength as well as L thumb pain and popping- I direct messaged his referring MD about this and she is agreeable to place OT referral. Exam  reveals significant limitations in functional muscle strength and flexibility, functional activity tolerance, and general balance; I also question if he might be having a bit of vestibular apparatus irritation from recent ear infection. Will benefit from skilled PT services to address impairments and reduce fall risk.    Personal Factors and Comorbidities Age;Fitness;Comorbidity 2    Examination-Activity Limitations Locomotion Level;Transfers;Bed Mobility;Squat;Stand    Examination-Participation Restrictions Church;Community Activity;Yard Work;Interpersonal Relationship    Stability/Clinical Decision Making Evolving/Moderate complexity    Clinical Decision Making Moderate    Rehab Potential Good    PT Frequency 2x / week    PT Duration 8 weeks    PT Treatment/Interventions ADLs/Self Care Home Management;Biofeedback;Cryotherapy;Electrical Stimulation;Iontophoresis 4mg /ml Dexamethasone;Moist Heat;Ultrasound;DME Instruction;Gait training;Stair training;Functional mobility training;Therapeutic activities;Therapeutic exercise;Balance training;Neuromuscular re-education;Cognitive remediation;Patient/family education;Manual techniques;Dry needling;Energy conservation;Taping    PT Next Visit Plan still need to do Berg and assign HEP; general strength, conditioning, balance, improving upright posture. Possible  vestibular screen?    Recommended Other Services OT for L thumb pain and R UE weakness/tremor    Consulted and Agree with Plan of Care Patient;Family member/caregiver    Family Member Consulted spouse             Patient will benefit from skilled therapeutic intervention in order to improve the following deficits and impairments:  Abnormal gait, Difficulty walking, Increased fascial restricitons, Decreased safety awareness, Dizziness, Decreased activity tolerance, Decreased balance, Hypomobility, Impaired flexibility, Improper body mechanics, Decreased mobility, Decreased strength, Postural dysfunction  Visit Diagnosis: Muscle weakness (generalized)  Difficulty in walking, not elsewhere classified  Unsteadiness on feet     Problem List Patient Active Problem List   Diagnosis Date Noted   Chronic post-traumatic stress disorder 08/03/2021   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/2021   Impotence 08/03/2021   Primary open angle glaucoma, 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   Tremor 08/03/2021   Memory loss 08/03/2021   Gait abnormality 08/03/2021   Status post total hip replacement, 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   Demand ischemia ([place]) 07/30/2019   Anxiety [id number]   Acute metabolic encephalopathy [id number]   Hematuria 07/08/2019   Thrombocytopenia ([place])  07/08/2019   Elevated troponin 07/08/2019   Sepsis ([place]) 07/07/2019   Essential hypertension 07/07/2019   Acute lower UTI 07/07/2019   ARF (acute renal failure) ([place]) 07/07/2019   Ann Lions PT, DPT, PN2   Supplemental Physical Therapist [facility]    Pager [phone] Acute Rehab Office [place]. [place], [place], [address] Phone: [phone]   Fax:  [phone]  Name: Carl Figueroa MRN: [id number] Date of Birth: November 01, 1945

## 2021-09-19 ENCOUNTER — Other Ambulatory Visit: Payer: Self-pay

## 2021-09-19 ENCOUNTER — Encounter: Payer: Self-pay | Admitting: Physical Therapy

## 2021-09-19 ENCOUNTER — Ambulatory Visit: Payer: Medicare Other | Admitting: Physical Therapy

## 2021-09-19 DIAGNOSIS — R2681 Unsteadiness on feet: Secondary | ICD-10-CM

## 2021-09-19 DIAGNOSIS — M6281 Muscle weakness (generalized): Secondary | ICD-10-CM | POA: Diagnosis not present

## 2021-09-19 DIAGNOSIS — R262 Difficulty in walking, not elsewhere classified: Secondary | ICD-10-CM

## 2021-09-19 NOTE — Therapy (Signed)
[place]. [place], [place], [address] Phone: [phone]   Fax:  [phone]  Physical Therapy Treatment  Patient Details  Name: Carl Figueroa MRN: [id number] Date of Birth: [birth year] Referring Provider (PT): Marcial Pacas   Encounter Date: 09/19/2021   PT End of Session - 09/19/21 1140     Visit Number 2    Date for PT Re-Evaluation 11/09/21    Authorization Type MCR and Tric[employer]    PT Start Time 1100    PT Stop Time 1143    PT Time Calculation (min) 43 min    Activity Tolerance Patient tolerated treatment well    Behavior During Therapy [facility] for tasks assessed/performed             Past Medical History:  Diagnosis Date   Anxiety    Arthritis    Hypertension     Past Surgical History:  Procedure Laterality Date   COLONOSCOPY     FOOT SURGERY     X2   HERNIA REPAIR Right    INGUINAL    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   Procedure: TOTAL HIP ARTHROPLASTY ANTERIOR APPROACH;  Surgeon: Hessie Knows, MD;  Location: [facility] ORS;  Service: Orthopedics;  Laterality: Left;    There were no vitals filed for this visit.   Subjective Assessment - 09/19/21 1101     Subjective Pain in the shoulders and knees. Clicking in the L thumb. No change since evaluation.    Currently in Pain? Yes    Pain Location --   Thumb   Pain Orientation Left                               [facility] Adult PT Treatment/Exercise - 09/19/21 0001       Exercises   Exercises Lumbar      Lumbar Exercises: Aerobic   UBE (Upper Arm Bike) L1 x 1 min each      Lumbar Exercises: Machines for Strengthening   Cybex Knee Extension 5lb 2x10    Cybex Knee Flexion 15lb 2x15      Lumbar Exercises: Standing   Other Standing Lumbar Exercises Shoulder Ext yellow 2x10      Lumbar Exercises: Seated   Sit to Stand 5 reps   x2 with and w/o UE   Other Seated Lumbar Exercises Rows red 2x10                        PT Short Term Goals - 09/14/21 1629       PT SHORT TERM GOAL #1   Title Will be compliant with appropriate progressive HEP    Time 4    Period Weeks    Status New    Target Date 10/12/21      PT SHORT TERM GOAL #2   Title Will be able to ambulate at least 511ft during 3MWT with LRAD to show improved mobility and functional activity tolerance    Time 4    Period Weeks    Status New      PT SHORT TERM GOAL #3   Title Will improve step/stride length, toe clearance, and upright posture to show improved gait pattern/mobility    Time 4    Period Weeks    Status New      PT SHORT TERM GOAL #4   Title Will show at least 50% improvement in  hamstring and hip flexor tightness in order to allow him to have more upright posture    Time 4    Period Weeks    Status New               PT Long Term Goals - 09/14/21 1633       PT LONG TERM GOAL #1   Title MMT to have improved by at least 1 grade in all weak groups    Time 8    Period Weeks    Status New    Target Date 11/09/21      PT LONG TERM GOAL #2   Title Will score at least 45 on the Berg balance test in order to show reduced fall risk    Time 8    Period Weeks    Status New      PT LONG TERM GOAL #3   Title Will be able to ambulate at least 1016ft in 6MWT with LRAD to show improved mobility and better community access    Time 8    Period Weeks    Status New      PT LONG TERM GOAL #4   Title Will be able to tolerate at least 15 minutes of machine (Nustep, reclining bike, etc) based exercise without increased fatigue to show improved functional activity tolerance    Time 8    Period Weeks    Status New      PT LONG TERM GOAL #5   Title Will participate in gym or community based exercise program in order to maintain functional gains and prevent recurrence of symptoms    Time 8    Period Weeks    Status New                   Plan - 09/19/21 1141     Clinical Impression  Statement Pt able to complete all interventions. Pt moves very slowly requiring  Increase time to complete interventions and mobility. Progressed with sit to stands completing some reps without UE assist. Cue for full ROM needed with leg curls and extensions. Postural cues needed with standing rows and seated rows.    Personal Factors and Comorbidities Age;Fitness;Comorbidity 2    Examination-Activity Limitations Locomotion Level;Transfers;Bed Mobility;Squat;Stand    Examination-Participation Restrictions Church;Community Activity;Yard Work;Interpersonal Relationship    Stability/Clinical Decision Making Evolving/Moderate complexity    Rehab Potential Good    PT Frequency 2x / week    PT Duration 8 weeks    PT Treatment/Interventions ADLs/Self Care Home Management;Biofeedback;Cryotherapy;Electrical Stimulation;Iontophoresis 4mg /ml Dexamethasone;Moist Heat;Ultrasound;DME Instruction;Gait training;Stair training;Functional mobility training;Therapeutic activities;Therapeutic exercise;Balance training;Neuromuscular re-education;Cognitive remediation;Patient/family education;Manual techniques;Dry needling;Energy conservation;Taping    PT Next Visit Plan still need to do Berg and assign HEP; general strength, conditioning, balance, improving upright posture. Possible vestibular screen?             Patient will benefit from skilled therapeutic intervention in order to improve the following deficits and impairments:  Abnormal gait, Difficulty walking, Increased fascial restricitons, Decreased safety awareness, Dizziness, Decreased activity tolerance, Decreased balance, Hypomobility, Impaired flexibility, Improper body mechanics, Decreased mobility, Decreased strength, Postural dysfunction  Visit Diagnosis: Muscle weakness (generalized)  Unsteadiness on feet  Difficulty in walking, not elsewhere classified     Problem List Patient Active Problem List   Diagnosis Date Noted   Chronic  post-traumatic stress disorder 08/03/2021   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/2021   Impotence 08/03/2021   Primary open angle glaucoma, 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   Tremor 08/03/2021  Memory loss 08/03/2021   Gait abnormality 08/03/2021   Status post total hip replacement, 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   Demand ischemia ([place]) 07/30/2019   Anxiety [id number]   Acute metabolic encephalopathy [id number]   Hematuria 07/08/2019   Thrombocytopenia ([place]) 07/08/2019   Elevated troponin 07/08/2019   Sepsis ([place]) 07/07/2019   Essential hypertension 07/07/2019   Acute lower UTI 07/07/2019   ARF (acute renal failure) ([place]) 07/07/2019    Scot Jun, PTA 09/19/2021, 11:46 AM  [place]. [place], [place], [address] Phone: [phone]   Fax:  [phone]  Name: Carl Figueroa MRN: [id number] Date of Birth: 08-18-1946

## 2021-09-21 ENCOUNTER — Ambulatory Visit: Payer: Medicare Other | Attending: Neurology | Admitting: Physical Therapy

## 2021-09-21 ENCOUNTER — Encounter: Payer: Self-pay | Admitting: Physical Therapy

## 2021-09-21 ENCOUNTER — Other Ambulatory Visit: Payer: Self-pay

## 2021-09-21 DIAGNOSIS — R2681 Unsteadiness on feet: Secondary | ICD-10-CM | POA: Insufficient documentation

## 2021-09-21 DIAGNOSIS — R262 Difficulty in walking, not elsewhere classified: Secondary | ICD-10-CM | POA: Diagnosis present

## 2021-09-21 DIAGNOSIS — R41842 Visuospatial deficit: Secondary | ICD-10-CM | POA: Insufficient documentation

## 2021-09-21 DIAGNOSIS — M6281 Muscle weakness (generalized): Secondary | ICD-10-CM | POA: Insufficient documentation

## 2021-09-21 DIAGNOSIS — R251 Tremor, unspecified: Secondary | ICD-10-CM | POA: Insufficient documentation

## 2021-09-21 DIAGNOSIS — R41844 Frontal lobe and executive function deficit: Secondary | ICD-10-CM | POA: Insufficient documentation

## 2021-09-21 NOTE — Therapy (Signed)
[place]. [place], [place], [address] Phone: [phone]   Fax:  [phone]  Physical Therapy Treatment  Patient Details  Name: Carl Figueroa MRN: [id number] Date of Birth: 20-Sep-1945 Referring Provider (PT): Marcial Pacas   Encounter Date: 09/21/2021   PT End of Session - 09/21/21 1743     Visit Number 3    Date for PT Re-Evaluation 11/09/21    Authorization Type MCR and Tricare    PT Start Time 1654    PT Stop Time 1742    PT Time Calculation (min) 48 min    Activity Tolerance Patient tolerated treatment well    Behavior During Therapy [facility] for tasks assessed/performed             Past Medical History:  Diagnosis Date   Anxiety    Arthritis    Hypertension     Past Surgical History:  Procedure Laterality Date   COLONOSCOPY     FOOT SURGERY     X2   HERNIA REPAIR Right    INGUINAL    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   Procedure: TOTAL HIP ARTHROPLASTY ANTERIOR APPROACH;  Surgeon: Hessie Knows, MD;  Location: [facility] ORS;  Service: Orthopedics;  Laterality: Left;    There were no vitals filed for this visit.   Subjective Assessment - 09/21/21 1655     Subjective Patient reports pain i the right shoulder and the hip and back, he reports that the exercises caused some pain after, reports he hopes it will get better as he works more    Currently in Pain? Yes    Pain Score 6     Pain Location Back    Pain Orientation Lower    Pain Descriptors / Indicators Aching;Sore    Aggravating Factors  just movements    Pain Relieving Factors just hurts, I don't like taking pills, I do put some rubs on it that help a little                               [facility] Adult PT Treatment/Exercise - 09/21/21 0001       High Level Balance   High Level Balance Comments ball toss, on airex head turns, eyes closed, 6" toe touches with cand and without      Lumbar Exercises: Aerobic    UBE (Upper Arm Bike) Level 1 x 3 minutes total    Nustep level 1 x 5 minutes      Lumbar Exercises: Machines for Strengthening   Cybex Knee Extension 5lb 2x10    Cybex Knee Flexion 15lb 2x15    Other Lumbar Machine Exercise tricep 15# 2x6      Lumbar Exercises: Seated   Other Seated Lumbar Exercises biceps with 3# stick                       PT Short Term Goals - 09/21/21 1745       PT SHORT TERM GOAL #1   Title Will be compliant with appropriate progressive HEP    Status On-going               PT Long Term Goals - 09/21/21 1745       PT LONG TERM GOAL #1   Title MMT to have improved by at least 1 grade in all weak groups    Status On-going  Plan - 09/21/21 1743     Clinical Impression Statement Patient very slow moving, able to do most of the exercises, does c/o some pain in the neck, shoulders and back with the activitie, I added more balance activities today, tends to not shift weight and was a little back on his heels.    PT Next Visit Plan continue to work on his strength and balance             Patient will benefit from skilled therapeutic intervention in order to improve the following deficits and impairments:  Abnormal gait, Difficulty walking, Increased fascial restricitons, Decreased safety awareness, Dizziness, Decreased activity tolerance, Decreased balance, Hypomobility, Impaired flexibility, Improper body mechanics, Decreased mobility, Decreased strength, Postural dysfunction  Visit Diagnosis: Muscle weakness (generalized)  Unsteadiness on feet  Difficulty in walking, not elsewhere classified     Problem List Patient Active Problem List   Diagnosis Date Noted   Chronic post-traumatic stress disorder 08/03/2021   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/2021   Impotence 08/03/2021   Primary open angle glaucoma, 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   Tremor 08/03/2021   Memory loss 08/03/2021   Gait abnormality 08/03/2021   Status post  total hip replacement, 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   Demand ischemia ([place]) 07/30/2019   Anxiety [id number]   Acute metabolic encephalopathy [id number]   Hematuria 07/08/2019   Thrombocytopenia ([place]) 07/08/2019   Elevated troponin 07/08/2019   Sepsis ([place]) 07/07/2019   Essential hypertension 07/07/2019   Acute lower UTI 07/07/2019   ARF (acute renal failure) ([place]) 07/07/2019    Sumner Boast, PT 09/21/2021, 5:46 PM  [place] [address]. [place], [place], [address] Phone: [phone]   Fax:  [phone]  Name: Carl Figueroa MRN: [id number] Date of Birth: 08/25/1945

## 2021-09-22 NOTE — Telephone Encounter (Signed)
error 

## 2021-10-03 ENCOUNTER — Ambulatory Visit: Payer: Medicare Other | Admitting: Physical Therapy

## 2021-10-04 ENCOUNTER — Other Ambulatory Visit: Payer: Self-pay

## 2021-10-04 ENCOUNTER — Ambulatory Visit: Payer: Medicare Other | Admitting: Occupational Therapy

## 2021-10-04 ENCOUNTER — Ambulatory Visit: Payer: Medicare Other | Admitting: Physical Therapy

## 2021-10-04 ENCOUNTER — Encounter: Payer: Self-pay | Admitting: Physical Therapy

## 2021-10-04 VITALS — BP 153/80 | HR 56

## 2021-10-04 DIAGNOSIS — M6281 Muscle weakness (generalized): Secondary | ICD-10-CM

## 2021-10-04 DIAGNOSIS — R251 Tremor, unspecified: Secondary | ICD-10-CM

## 2021-10-04 DIAGNOSIS — R2681 Unsteadiness on feet: Secondary | ICD-10-CM

## 2021-10-04 DIAGNOSIS — R262 Difficulty in walking, not elsewhere classified: Secondary | ICD-10-CM

## 2021-10-04 DIAGNOSIS — R41842 Visuospatial deficit: Secondary | ICD-10-CM

## 2021-10-04 DIAGNOSIS — R41844 Frontal lobe and executive function deficit: Secondary | ICD-10-CM

## 2021-10-04 NOTE — Therapy (Signed)
[address] [place], [place], [address] Phone: [phone]   Fax:  [phone]  Physical Therapy Treatment  Patient Details  Name: Carl Figueroa MRN: [id number] Date of Birth: Apr 12, 1946 Referring Provider (PT): Marcial Pacas   Encounter Date: 10/04/2021   PT End of Session - 10/04/21 1707     Visit Number 4    Number of Visits 17    Date for PT Re-Evaluation 11/09/21    Authorization Type MCR and Tricare    Authorization Time Period 09/14/21 to 10/19/21    Progress Note Due on Visit 10    PT Start Time 1618    PT Stop Time 1700    PT Time Calculation (min) 42 min    Activity Tolerance Patient tolerated treatment well    Behavior During Therapy [facility] for tasks assessed/performed             Past Medical History:  Diagnosis Date   Anxiety    Arthritis    Hypertension     Past Surgical History:  Procedure Laterality Date   COLONOSCOPY     FOOT SURGERY     X2   HERNIA REPAIR Right    INGUINAL    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   Procedure: TOTAL HIP ARTHROPLASTY ANTERIOR APPROACH;  Surgeon: Hessie Knows, MD;  Location: [facility] ORS;  Service: Orthopedics;  Laterality: Left;    There were no vitals filed for this visit.   Subjective Assessment - 10/04/21 1621     Subjective Feeling good today, saw OT today. No falls or anything recently.    Patient Stated Goals get going moving a little better, reduce tremor as much as possible    Currently in Pain? Yes    Pain Score 6     Pain Location Shoulder    Pain Orientation Right;Left    Pain Descriptors / Indicators Aching   like a pain                              [facility] Adult PT Treatment/Exercise - 10/04/21 0001       Lumbar Exercises: Aerobic   Nustep level 1-2 x 5 minutes   alternated due to reports of pain in BLEs and back     Lumbar Exercises: Standing   Heel Raises 10 reps    Heel Raises Limitations BUE support       Lumbar Exercises: Seated   Other Seated Lumbar Exercises standing step ups on 2 inch box 1x10 B forward and lateral   tried 4 inch step but this aggravated knee pain   Other Seated Lumbar Exercises seated hip ABD with red TB x10, seated hip flexion x10, LAQs red TB 1x10                 Balance Exercises - 10/04/21 0001       Balance Exercises: Standing   Tandem Stance Eyes open;3 reps;15 secs;Foam/compliant surface    Tandem Gait Forward;4 reps   in // bars   Retro Gait 4 reps   in // bars   Other Standing Exercises standing eyes closed on foam pad 15 seconds x3 narrow BOS                PT Education - 10/04/21 1707     Education Details exericse form/purpose    Person(s) Educated Patient    Methods Explanation    Comprehension  Verbalized understanding              PT Short Term Goals - 09/21/21 1745       PT SHORT TERM GOAL #1   Title Will be compliant with appropriate progressive HEP    Status On-going               PT Long Term Goals - 09/21/21 1745       PT LONG TERM GOAL #1   Title MMT to have improved by at least 1 grade in all weak groups    Status On-going                   Plan - 10/04/21 1707     Clinical Impression Statement Mr. Pink arrives doing well, just finished OT. BP was a little high with OT, did a little light work on Nustep to see if this helped lower pressures through vasodilation, 151/80 HR 60 after light activity on Nustep. Otherwise continued to work on general strength and balance- he is very pleasant and wants to improve, did need some encouragement to work through muscle pain/soreness and general joint discomfort but able to progress activities this afternoon. Encouraged f/u with PCP about HTN.    Personal Factors and Comorbidities Age;Fitness;Comorbidity 2    Examination-Activity Limitations Locomotion Level;Transfers;Bed Mobility;Squat;Stand    Examination-Participation Restrictions Church;Community  Activity;Yard Work;Interpersonal Relationship    Stability/Clinical Decision Making Evolving/Moderate complexity    Clinical Decision Making Moderate    Rehab Potential Good    PT Frequency 2x / week    PT Duration 8 weeks    PT Treatment/Interventions ADLs/Self Care Home Management;Biofeedback;Cryotherapy;Electrical Stimulation;Iontophoresis 4mg /ml Dexamethasone;Moist Heat;Ultrasound;DME Instruction;Gait training;Stair training;Functional mobility training;Therapeutic activities;Therapeutic exercise;Balance training;Neuromuscular re-education;Cognitive remediation;Patient/family education;Manual techniques;Dry needling;Energy conservation;Taping    PT Next Visit Plan continue to work on his strength and balance    Consulted and Agree with Plan of Care Patient;Family member/caregiver    Family Member Consulted spouse             Patient will benefit from skilled therapeutic intervention in order to improve the following deficits and impairments:  Abnormal gait, Difficulty walking, Increased fascial restricitons, Decreased safety awareness, Dizziness, Decreased activity tolerance, Decreased balance, Hypomobility, Impaired flexibility, Improper body mechanics, Decreased mobility, Decreased strength, Postural dysfunction  Visit Diagnosis: Muscle weakness (generalized)  Unsteadiness on feet  Difficulty in walking, not elsewhere classified     Problem List Patient Active Problem List   Diagnosis Date Noted   Chronic post-traumatic stress disorder 08/03/2021   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/2021   Impotence 08/03/2021   Primary open angle glaucoma, 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   Tremor 08/03/2021   Memory loss 08/03/2021   Gait abnormality 08/03/2021   Status post total hip replacement, 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   Demand ischemia ([place]) 07/30/2019   Anxiety [id number]   Acute metabolic encephalopathy [id number]   Hematuria 07/08/2019   Thrombocytopenia ([place]) 07/08/2019   Elevated troponin 07/08/2019   Sepsis  ([place]) 07/07/2019   Essential hypertension 07/07/2019   Acute lower UTI 07/07/2019   ARF (acute renal failure) ([place]) 07/07/2019   Ann Lions PT, DPT, PN2   Supplemental Physical Therapist [place]. [place], [place], [address] Phone: [phone]   Fax:  [phone]  Name: Hanif Yannuzzi MRN: [id number] Date of Birth: May 26, 1946

## 2021-10-05 ENCOUNTER — Encounter: Payer: Self-pay | Admitting: Physical Therapy

## 2021-10-05 ENCOUNTER — Ambulatory Visit: Payer: Medicare Other | Admitting: Physical Therapy

## 2021-10-05 DIAGNOSIS — R2681 Unsteadiness on feet: Secondary | ICD-10-CM

## 2021-10-05 DIAGNOSIS — M6281 Muscle weakness (generalized): Secondary | ICD-10-CM

## 2021-10-05 DIAGNOSIS — R262 Difficulty in walking, not elsewhere classified: Secondary | ICD-10-CM

## 2021-10-05 NOTE — Therapy (Signed)
[place]. [place], [place], [address] Phone: [phone]   Fax:  [phone]  Physical Therapy Treatment  Patient Details  Name: Carl Figueroa MRN: [id number] Date of Birth: 09-May-1946 Referring Provider (PT): Marcial Pacas   Encounter Date: 10/05/2021   PT End of Session - 10/05/21 1445     Visit Number 5    Number of Visits 17    Date for PT Re-Evaluation 11/09/21    Authorization Type MCR and Tricare    Authorization Time Period 09/14/21 to 10/19/21    Progress Note Due on Visit 10    PT Start Time 1404    PT Stop Time 1443    PT Time Calculation (min) 39 min    Activity Tolerance Patient tolerated treatment well    Behavior During Therapy [facility] for tasks assessed/performed             Past Medical History:  Diagnosis Date   Anxiety    Arthritis    Hypertension     Past Surgical History:  Procedure Laterality Date   COLONOSCOPY     FOOT SURGERY     X2   HERNIA REPAIR Right    INGUINAL    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   Procedure: TOTAL HIP ARTHROPLASTY ANTERIOR APPROACH;  Surgeon: Hessie Knows, MD;  Location: [facility] ORS;  Service: Orthopedics;  Laterality: Left;    There were no vitals filed for this visit.   Subjective Assessment - 10/05/21 1408     Subjective Feeling OK today, main complaint is stiffness in my shoulder and back    Patient Stated Goals get going moving a little better, reduce tremor as much as possible    Currently in Pain? Yes    Pain Score 6     Pain Location Back    Pain Orientation Right;Left;Medial    Pain Descriptors / Indicators Aching    Pain Type Chronic pain    Pain Radiating Towards none                               [facility] Adult PT Treatment/Exercise - 10/05/21 0001       Lumbar Exercises: Stretches   Passive Hamstring Stretch Right;Left;3 reps;30 seconds    Single Knee to Chest Stretch 5 reps    Piriformis Stretch 3  reps;Right;Left;30 seconds    Gastroc Stretch Right;Left;3 reps;30 seconds    Other Lumbar Stretch Exercise lumbar rotation stretches 5x5 second holds B                 Balance Exercises - 10/05/21 0001       Balance Exercises: Standing   SLS Eyes open;Solid surface;2 reps;15 secs    Other Standing Exercises heel raises narrow BOS x10    Other Standing Exercises Comments external perturbations x30 solid surface                PT Education - 10/05/21 1445     Education Details purpose of new exercises/modified session today    Person(s) Educated Patient    Methods Explanation    Comprehension Verbalized understanding              PT Short Term Goals - 09/21/21 1745       PT SHORT TERM GOAL #1   Title Will be compliant with appropriate progressive HEP    Status On-going  PT Long Term Goals - 09/21/21 1745       PT LONG TERM GOAL #1   Title MMT to have improved by at least 1 grade in all weak groups    Status On-going                   Plan - 10/05/21 1445     Clinical Impression Statement Carl Figueroa arrives today really complaining about back stiffness and soreness, we modified session and focused on loosening up his back and improving stiffness today. Tolerated low back stretches very well, if he feels they helped, we can certainly add these stretches to his home program. Needed a bit of time to settle due to possible OH. 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 Very pleasant gentleman, thank you for the opportunity to participate in his care!    Personal Factors and Comorbidities Age;Fitness;Comorbidity 2    Examination-Activity Limitations Locomotion Level;Transfers;Bed Mobility;Squat;Stand    Examination-Participation Restrictions Church;Community Activity;Yard Work;Interpersonal Relationship    Stability/Clinical Decision Making Evolving/Moderate complexity    Clinical Decision Making Moderate    Rehab Potential  Good    PT Frequency 2x / week    PT Duration 8 weeks    PT Treatment/Interventions ADLs/Self Care Home Management;Biofeedback;Cryotherapy;Electrical Stimulation;Iontophoresis 4mg /ml Dexamethasone;Moist Heat;Ultrasound;DME Instruction;Gait training;Stair training;Functional mobility training;Therapeutic activities;Therapeutic exercise;Balance training;Neuromuscular re-education;Cognitive remediation;Patient/family education;Manual techniques;Dry needling;Energy conservation;Taping    PT Next Visit Plan how did he feel after new stretches? assign to HEP if they helped. See if we can get back to higher level tasks otherwise    Consulted and Agree with Plan of Care Patient             Patient will benefit from skilled therapeutic intervention in order to improve the following deficits and impairments:  Abnormal gait, Difficulty walking, Increased fascial restricitons, Decreased safety awareness, Dizziness, Decreased activity tolerance, Decreased balance, Hypomobility, Impaired flexibility, Improper body mechanics, Decreased mobility, Decreased strength, Postural dysfunction  Visit Diagnosis: Muscle weakness (generalized)  Difficulty in walking, not elsewhere classified  Unsteadiness on feet     Problem List Patient Active Problem List   Diagnosis Date Noted   Chronic post-traumatic stress disorder 08/03/2021   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/2021   Impotence 08/03/2021   Primary open angle glaucoma, 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   Tremor 08/03/2021   Memory loss 08/03/2021   Gait abnormality 08/03/2021   Status post total hip replacement, 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   Demand ischemia ([place]) 07/30/2019   Anxiety [id number]   Acute metabolic encephalopathy [id number]   Hematuria 07/08/2019   Thrombocytopenia ([place]) 07/08/2019   Elevated troponin 07/08/2019   Sepsis ([place]) 07/07/2019   Essential hypertension 07/07/2019   Acute lower UTI 07/07/2019   ARF (acute renal failure) ([place]) 07/07/2019   Ann Lions PT, DPT, PN2    Supplemental Physical Therapist [place]. [place], [place], [address] Phone: [phone]   Fax:  [phone]  Name: Carl Figueroa MRN: [id number] Date of Birth: Dec 16, 1945

## 2021-10-05 NOTE — Therapy (Signed)
[place] [place], [place], [address] Phone: [phone]   Fax:  [phone]  Occupational Therapy Evaluation  Patient Details  Name: Carl Figueroa MRN: [id number] Date of Birth: 06/14/1946 No data recorded  Encounter Date: 10/04/2021   OT End of Session - 10/04/21 1546     Visit Number 1    Number of Visits 9    Date for OT Re-Evaluation 01/02/22    Authorization Type Medicare (primary); Tricare (secondary)    Progress Note Due on Visit 10    OT Start Time 1530    OT Stop Time 1612    OT Time Calculation (min) 42 min    Activity Tolerance Patient tolerated treatment well    Behavior During Therapy WFL for tasks assessed/performed            Past Medical History:  Diagnosis Date   Anxiety    Arthritis    Hypertension     Past Surgical History:  Procedure Laterality Date   COLONOSCOPY     FOOT SURGERY     X2   HERNIA REPAIR Right    INGUINAL    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   Procedure: TOTAL HIP ARTHROPLASTY ANTERIOR APPROACH;  Surgeon: Hessie Knows, MD;  Location: [facility] ORS;  Service: Orthopedics;  Laterality: Left;    Vitals:   10/04/21 1536  BP: (!) 153/80  Pulse: (!) 56     Subjective Assessment - 10/04/21 1535     Subjective  Pt arrives to OT session w/ primary concerns related to bilateral hand tremor w/ R hand (dominant) more affected than L. Pt states this has been progressively worsening over the past few years and he now has difficulty using eating utensils, FM tasks, and dropping objects. Pt also reports feeling like he is having more difficulty w/ his memory as well.    Pertinent History Bilateral hand action tremor; memory loss; and gait abnormality (multifactorial, deconditioning, joint pain); PMH includes anxiety, arthritis, HTN, and hx of THA in March 2021   Patient Stated Goals Button and unbutton my shirt; use eating utensils normally as much as possible     Currently in Pain? Yes    Pain Score 7     Pain Location Finger (Comment which one)   L thumb around [facility]   Pain Orientation Left    Pain Descriptors / Indicators Aching    Pain Onset In the past 7 days    Pain Frequency Constant    Aggravating Factors  Opening hand    Pain Relieving Factors Massage             [facility] OT Assessment - 10/04/21 1541       Assessment   Medical Diagnosis Tremor/memory loss    Referring Provider Marcial Pacas, MD   Hand Dominance Right    Prior Therapy Currently in PT      Precautions   Precautions Fall      Balance Screen   Has the patient fallen in the past 6 months No      Home  Environment   Type of [place] One level    Bathroom Shower/Tub Tub/Shower unit    [place] seat;Grab bars - tub/shower;Grab bars - toilet;Cane - single point;Walker - 2 wheels;Associate Professor     Prior Function   Level of Independence Requires assistive device for independence  Vocation Retired    Leisure Automotive engineer; watching TV      ADL   Eating/Feeding Needs assist with cutting food    Grooming Modified independent    Upper Body Bathing Modified independent    Lower Body Bathing Modified independent    Upper Body Dressing Needs assist for fasteners    Lower Body Dressing Needs assist for fasteners    Tub/Shower Transfer Modified independent      IADL   Light Housekeeping Performs light daily tasks such as dishwashing, bed making    [place] own vehicle   Local, short-distances     Written Expression   Dominant Hand Right      Vision - History   Baseline Vision Bifocals    Visual History Glaucoma   Cataract surgery in OD last week     Sensation   Additional Comments No deficits, per pt report      Coordination   Gross Motor Movements are Fluid and Coordinated No    Fine Motor Movements are Fluid and Coordinated No    9 Hole Peg Test Right;Left    Right 9 Hole Peg  Test 34 sec   Left 9 Hole Peg Test 37 sec   Box and Blocks 23 w/ RUE; 22 w/ LUE    Tremors Bilateral hand tremors; R > L      ROM / Strength   AROM / PROM / Strength AROM      AROM   Overall AROM  Within functional limits for tasks performed    AROM Assessment Site Shoulder;Elbow;Forearm;Wrist;Finger;Thumb      Hand Function   Right Hand Grip (lbs) 58 lbs    Right Hand 3 Point Pinch 7 lbs    Left Hand Grip (lbs) 68 lbs    Left 3 point pinch 9 lbs             OT Education - 10/04/21 [id number]     Education Details Education provided on role and purpose of OT, as well as potential interventions and goals for therapy based on initial evaluation findings.    Person(s) Educated Patient    Methods Explanation    Comprehension Verbalized understanding             OT Short Term Goals - 10/04/21 1558       OT SHORT TERM GOAL #1   Title Pt will demonstrate understanding of at least 2 memory compensatory strategies during simulated daily activities    Baseline Decreased knowledge of strategies    Time 4    Period Weeks    Status New    Target Date 11/03/21             OT Long Term Goals - 10/04/21 1559       OT LONG TERM GOAL #1   Title Pt will be able to button/unbutton at least 5 buttons w/ Mod I, using AE prn    Baseline Unable to fasten shirt buttons    Time 8    Period Weeks    Status New    Target Date 12/01/21      OT LONG TERM GOAL #2   Title Pt will be independent w/ wear and care of prefab or custom-fit orthosis recommended for hand pain/support, if needed    Baseline Bilateral hand pain    Time 8    Period Weeks    Status New    Target Date 12/01/21      OT LONG  TERM GOAL #3   Title Pt will increased R hand 3-point pinch strength by at least 2 lbs to improve participation in functional FM tasks    Baseline 7 lb w/ R hand (9 lb w/ L)    Time 8    Period Weeks    Status New    Target Date 12/01/21      OT LONG TERM GOAL #4   Title Pt will be  able to scoop 5/5 beans out of a bowl using spoon w/ or w/out AE incorporated and no drops/spills    Baseline Reports difficulty using eating utensils due to tremor    Time 8    Period Weeks    Status New    Target Date 12/01/21             Plan - 10/04/21 1653     Clinical Impression Statement Pt is a 76 y/o male who presents to OP OT due to bilateral hand tremor and memory loss. PMH includes anxiety, arthritis, HTN, open angle glaucoma of R eye, and hx of THA in March 2021. Pt currently lives with his wife in a single-level home, is retired, and has good access/understanding of DME. Pt demonstrates decreased control/coordination and strength and will benefit from skilled occupational therapy to address these limitations, as well as pain management, memory compensatory strategies, introduction of adaptive techniques/AE for daily tasks prn, and implementation of an HEP to improve participation and safety during desired ADL tasks.    OT Occupational Profile and History Detailed Assessment- Review of Records and additional review of physical, cognitive, psychosocial history related to current functional performance    Occupational performance deficits (Please refer to evaluation for details): ADL's;IADL's;Leisure    Body Structure / Function / Physical Skills ADL;GMC;UE functional use;Endurance;Balance;FMC;Mobility;Vision;IADL;Dexterity;Coordination;Body mechanics    Cognitive Skills Memory    Rehab Potential Good    Clinical Decision Making Several treatment options, min-mod task modification necessary    Comorbidities Affecting Occupational Performance: Presence of comorbidities impacting occupational performance    Modification or Assistance to Complete Evaluation  Min-Moderate modification of tasks or assist with assess necessary to complete eval    OT Frequency 1x / week    OT Duration 8 weeks    OT Treatment/Interventions Self-care/ADL training;Therapeutic exercise;Energy  conservation;Therapist, nutritional;Therapeutic activities;Visual/perceptual remediation/compensation;Balance training;Patient/family education;Cognitive remediation/compensation;Manual Therapy;DME and/or AE instruction;Neuromuscular education;Cryotherapy;Moist Heat    Recommended Other Services Currently receiving PT at this clinic    Consulted and Agree with Plan of Care Patient            Patient will benefit from skilled therapeutic intervention in order to improve the following deficits and impairments:   Body Structure / Function / Physical Skills: ADL, GMC, UE functional use, Endurance, Balance, FMC, Mobility, Vision, IADL, Dexterity, Coordination, Body mechanics, Strength Cognitive Skills: Memory   Visit Diagnosis: Tremor  Frontal lobe and executive function deficit  Muscle weakness (generalized)  Visuospatial deficit   Problem List Patient Active Problem List   Diagnosis Date Noted   Chronic post-traumatic stress disorder 08/03/2021   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/2021   Impotence 08/03/2021   Primary open angle glaucoma, 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   Tremor 08/03/2021   Memory loss 08/03/2021   Gait abnormality 08/03/2021   Status post total hip replacement, 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   Demand ischemia ([place]) 07/30/2019   Anxiety [id number]   Acute metabolic encephalopathy [id number]   Hematuria 07/08/2019   Thrombocytopenia ([place]) 07/08/2019   Elevated troponin 07/08/2019   Sepsis ([place]) 07/07/2019   Essential  hypertension 07/07/2019   Acute lower UTI 07/07/2019   ARF (acute renal failure) ([place]) 07/07/2019    Kathrine Cords, MSOT, OTR/L 10/04/2021, 5:00 PM  [place]. [place], [place], [address] Phone: [phone]   Fax:  [phone]  Name: Carl Figueroa MRN: [id number] Date of Birth: [birth year]

## 2021-10-10 ENCOUNTER — Ambulatory Visit: Payer: Medicare Other | Admitting: Physical Therapy

## 2021-10-10 ENCOUNTER — Ambulatory Visit: Payer: Medicare Other | Admitting: Occupational Therapy

## 2021-10-12 ENCOUNTER — Ambulatory Visit: Payer: Medicare Other | Admitting: Physical Therapy

## 2021-10-12 ENCOUNTER — Encounter: Payer: Self-pay | Admitting: Physical Therapy

## 2021-10-12 ENCOUNTER — Other Ambulatory Visit: Payer: Self-pay

## 2021-10-12 DIAGNOSIS — R2681 Unsteadiness on feet: Secondary | ICD-10-CM

## 2021-10-12 DIAGNOSIS — M6281 Muscle weakness (generalized): Secondary | ICD-10-CM

## 2021-10-12 DIAGNOSIS — R262 Difficulty in walking, not elsewhere classified: Secondary | ICD-10-CM

## 2021-10-12 NOTE — Therapy (Signed)
[facility] [place] [facility] [address] [place], [place], [address] Phone: [phone]   Fax:  [phone]  Patient Details  Name: Carl Figueroa MRN: [id number] Date of Birth: Jan 03, 1946 Referring Provider:  Rayetta Humphrey, PA-C  Encounter Date: 10/12/2021  [name] arrives today feeling quite dizzy, asked me to check his BP which was as follows:  Seated BP #1: 93/56 HR 79  Seated BP #2: 92/58 HR 80  Standing BP: 87/53 HR 92  His BP is usually on the high side (150s systolic, 70-90s diastolic) so readings this low are a bit concerning especially since he is symptomatic. I did give him a print out of some lumbar exercises we did last session since they were successful in reducing his pain.   Walked him out to his car, left him in care of his wife and educated her on significance of BPs this low, I would like them to call PCP and get worked up to make sure there is not something more insidious causing this sudden change in pressures.   No charge for today's session.   Lerry Liner PT, DPT, PN2   Supplemental Physical Therapist        [facility] [place] [facility] [address] [place], [place], [address] Phone: [phone]   Fax:  [phone]

## 2021-10-17 ENCOUNTER — Ambulatory Visit: Payer: Medicare Other | Admitting: Physical Therapy

## 2021-10-17 ENCOUNTER — Other Ambulatory Visit: Payer: Self-pay

## 2021-10-17 ENCOUNTER — Ambulatory Visit: Payer: Medicare Other | Admitting: Occupational Therapy

## 2021-10-17 ENCOUNTER — Encounter: Payer: Self-pay | Admitting: Physical Therapy

## 2021-10-17 VITALS — BP 177/92 | HR 55

## 2021-10-17 DIAGNOSIS — M6281 Muscle weakness (generalized): Secondary | ICD-10-CM | POA: Diagnosis not present

## 2021-10-17 DIAGNOSIS — R2681 Unsteadiness on feet: Secondary | ICD-10-CM

## 2021-10-17 DIAGNOSIS — R41844 Frontal lobe and executive function deficit: Secondary | ICD-10-CM

## 2021-10-17 DIAGNOSIS — R262 Difficulty in walking, not elsewhere classified: Secondary | ICD-10-CM

## 2021-10-17 NOTE — Therapy (Signed)
[place]. [place], [place], [address] Phone: [phone]   Fax:  [phone]  Occupational Therapy Treatment  Patient Details  Name: Carl Figueroa MRN: [id number] Date of Birth: 04-17-1946 Referring Provider (OT): Marcial Pacas, MD   Encounter Date: 10/17/2021   OT End of Session - 10/17/21 1559     Visit Number 2    Number of Visits 9    Date for OT Re-Evaluation 01/02/22    Authorization Type Medicare (primary); Tricare (secondary)    Progress Note Due on Visit 10    OT Start Time 1515    OT Stop Time 1540    OT Time Calculation (min) 25 min    Activity Tolerance Patient tolerated treatment well    Behavior During Therapy WFL for tasks assessed/performed            Past Medical History:  Diagnosis Date   Anxiety    Arthritis    Hypertension     Past Surgical History:  Procedure Laterality Date   COLONOSCOPY     FOOT SURGERY     X2   HERNIA REPAIR Right    INGUINAL    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   Procedure: TOTAL HIP ARTHROPLASTY ANTERIOR APPROACH;  Surgeon: Hessie Knows, MD;  Location: [facility] ORS;  Service: Orthopedics;  Laterality: Left;    Vitals:   10/17/21 1516  BP: (!) 177/92  Pulse: (!) 55     Subjective Assessment - 10/17/21 1516     Subjective  Pt stated he would like to proceed w/ shortened OT session today    Pertinent History Bilateral hand action tremor; memory loss; and gait abnormality (multifactorial, deconditioning, joint pain); PMH includes anxiety, arthritis, HTN, and hx of THA in March 2021    Patient Stated Goals Button and unbutton my shirt; use eating utensils normally as much as possible    Currently in Pain? Yes    Pain Score --    Pain Location Back    Pain Onset --             OT Education - 10/17/21 1555     Education Details Education provided on memory compensatory strategies and discussed potential pt-specific examples of ways to  incorporate techniques in daily habits/routine prn. Strategies included using sticky notes/notepad to write reminders, having a box or folder near the door, and using a calendar/planner. OT provided sample options (monthly, weekly, daily) for pt to trial at home and allow time to problem-solve w/ OT next week prior to potential self-purchase of a calendar/planner.    Person(s) Educated Patient    Methods Explanation;Handout    Comprehension Verbalized understanding             OT Short Term Goals - 10/17/21 1602       OT SHORT TERM GOAL #1   Title Pt will demonstrate understanding of at least 2 memory compensatory strategies during simulated daily activities    Baseline Decreased knowledge of strategies    Time 4    Period Weeks    Status On-going    Target Date 11/03/21             OT Long Term Goals - 10/04/21 1559       OT LONG TERM GOAL #1   Title Pt will be able to button/unbutton at least 5 buttons w/ Mod I, using AE prn    Baseline Unable to fasten shirt buttons  Time 8    Period Weeks    Status New    Target Date 12/01/21      OT LONG TERM GOAL #2   Title Pt will be independent w/ wear and care of prefab or custom-fit orthosis recommended for hand pain/support, if needed    Baseline Bilateral hand pain    Time 8    Period Weeks    Status New    Target Date 12/01/21      OT LONG TERM GOAL #3   Title Pt will increased R hand 3-point pinch strength by at least 2 lbs to improve participation in functional FM tasks    Baseline 7 lb w/ R hand (9 lb w/ L)    Time 8    Period Weeks    Status New    Target Date 12/01/21      OT LONG TERM GOAL #4   Title Pt will be able to scoop 5/5 beans out of a bowl using spoon w/ or w/out AE incorporated and no drops/spills    Baseline Reports difficulty using eating utensils due to tremor    Time 8    Period Weeks    Status New    Target Date 12/01/21             Plan - 10/17/21 1602     Clinical Impression  Statement Pt transitioned into session from PT w/ physical therapist reporting session was not completed today due to persistent high BP measurements and pt's report of dizziness. OT facilitated dicussion w/ pt regarding potential options for OT session today w/ pt requesting to proceed, stating that he was feeling "okay." OT completed education regarding memory compensatory strategies and then re-assessed BP. BP remained elevated at 177/92, pulse 55, and OT recommended d/c of session, strongly encouraging pt to follow-up w/ his physician today. OT provided additional education on benefit of seeking additional medical care if BP remains high or pt begins to experience new or worsening symptoms; pt verbalized understanding and was able to walk out of clinic w/out difficulty.    OT Occupational Profile and History Detailed Assessment- Review of Records and additional review of physical, cognitive, psychosocial history related to current functional performance    Occupational performance deficits (Please refer to evaluation for details): ADL's;IADL's;Leisure    Body Structure / Function / Physical Skills ADL;GMC;UE functional use;Endurance;Balance;FMC;Mobility;Vision;IADL;Dexterity;Coordination;Body mechanics;Strength    Cognitive Skills Memory    Rehab Potential Good    Clinical Decision Making Several treatment options, min-mod task modification necessary    Comorbidities Affecting Occupational Performance: Presence of comorbidities impacting occupational performance    Modification or Assistance to Complete Evaluation  Min-Moderate modification of tasks or assist with assess necessary to complete eval    OT Frequency 1x / week    OT Duration 8 weeks    OT Treatment/Interventions Self-care/ADL training;Therapeutic exercise;Energy conservation;Therapist, nutritional;Therapeutic activities;Visual/perceptual remediation/compensation;Balance training;Patient/family education;Cognitive  remediation/compensation;Manual Therapy;DME and/or AE instruction;Neuromuscular education;Cryotherapy;Moist Heat    Recommended Other Services Currently receiving PT at this clinic    Consulted and Agree with Plan of Care Patient            Patient will benefit from skilled therapeutic intervention in order to improve the following deficits and impairments:   Body Structure / Function / Physical Skills: ADL, GMC, UE functional use, Endurance, Balance, FMC, Mobility, Vision, IADL, Dexterity, Coordination, Body mechanics, Strength Cognitive Skills: Memory    Visit Diagnosis: Frontal lobe and executive function deficit  Muscle weakness (generalized)  Problem List Patient Active Problem List   Diagnosis Date Noted   Chronic post-traumatic stress disorder 08/03/2021   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/2021   Impotence 08/03/2021   Primary open angle glaucoma, 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   Tremor 08/03/2021   Memory loss 08/03/2021   Gait abnormality 08/03/2021   Status post total hip replacement, 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   Demand ischemia ([place]) 07/30/2019   Anxiety [id number]   Acute metabolic encephalopathy [id number]   Hematuria 07/08/2019   Thrombocytopenia ([place]) 07/08/2019   Elevated troponin 07/08/2019   Sepsis ([place]) 07/07/2019   Essential hypertension 07/07/2019   Acute lower UTI 07/07/2019   ARF (acute renal failure) ([place]) 07/07/2019    Kathrine Cords, MSOT, OTR/L 10/17/2021, 4:12 PM  [place]. [place], [place], [address] Phone: [phone]   Fax:  [phone]  Name: Carl Figueroa MRN: [id number] Date of Birth: August 23, 1945

## 2021-10-17 NOTE — Therapy (Signed)
[facility] [place] [facility] [address]. [place], [place], [address] Phone: [phone]   Fax:  [phone]  Patient Details  Name: Doyle Kunath MRN: [id number] Date of Birth: 10/13/1945 Referring Provider:  Rayetta Humphrey, PA-C  Encounter Date: 10/17/2021  Mr. Mcclure arrives today doing OK, still dizzy. Saw his MD and they are having him log BPs at home and plan to do some more tests for further w/u.  Took orthostatics today as follows:  Supine 176/85 HR 51  Sitting 182/97 HR 56   Standing 173/94 HR 61  Standing x3 minutes 173/94 HR 65  Given elevated symptomatic BP I did not feel safe using exercise interventions today- wrote down pressures and advised him to continue following up with MD. We may need to consider going on hold until BP is stabilized- will discuss this if his pressure continues to be concerning next session.   No charge for today's session.   Lerry Liner PT, DPT, PN2   Supplemental Physical Therapist [place]       [facility] [place] [facility] [address] [place], [place], [address] Phone: [phone]   Fax:  [phone]

## 2021-10-20 ENCOUNTER — Other Ambulatory Visit: Payer: Self-pay

## 2021-10-20 ENCOUNTER — Ambulatory Visit: Payer: Medicare Other | Attending: Neurology | Admitting: Physical Therapy

## 2021-10-20 ENCOUNTER — Encounter: Payer: Self-pay | Admitting: Physical Therapy

## 2021-10-20 DIAGNOSIS — R262 Difficulty in walking, not elsewhere classified: Secondary | ICD-10-CM | POA: Diagnosis present

## 2021-10-20 DIAGNOSIS — M6281 Muscle weakness (generalized): Secondary | ICD-10-CM | POA: Diagnosis not present

## 2021-10-20 DIAGNOSIS — R41844 Frontal lobe and executive function deficit: Secondary | ICD-10-CM | POA: Diagnosis present

## 2021-10-20 DIAGNOSIS — R41842 Visuospatial deficit: Secondary | ICD-10-CM | POA: Insufficient documentation

## 2021-10-20 DIAGNOSIS — R2681 Unsteadiness on feet: Secondary | ICD-10-CM | POA: Insufficient documentation

## 2021-10-20 DIAGNOSIS — R251 Tremor, unspecified: Secondary | ICD-10-CM | POA: Diagnosis present

## 2021-10-20 NOTE — Therapy (Signed)
[place] [facility] ?[address] ?[place], [place], [address] ?Phone: [phone]   Fax:  [phone] ? ?Physical Therapy Treatment ? ?Patient Details  ?Name: Carl Figueroa ?MRN: [id number] ?Date of Birth: [birth year] ?Referring Provider (PT): Levert Feinstein ? ? ?Encounter Date: 10/20/2021 ? ? PT End of Session - 10/20/21 1049   ? ? Visit Number 6   ? Number of Visits 17   ? Date for PT Re-Evaluation 11/09/21   ? PT Start Time 1014   ? PT Stop Time 1052   ? PT Time Calculation (min) 38 min   ? Activity Tolerance Patient tolerated treatment well   ? Behavior During Therapy [facility] for tasks assessed/performed   ? ?  ?  ? ?  ? ? ?Past Medical History:  ?Diagnosis Date  ? Anxiety   ? Arthritis   ? Hypertension   ? ? ?Past Surgical History:  ?Procedure Laterality Date  ? COLONOSCOPY    ? FOOT SURGERY    ? X2  ? HERNIA REPAIR Right   ? INGUINAL   ? 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  ? Procedure: TOTAL HIP ARTHROPLASTY ANTERIOR APPROACH;  Surgeon: Kennedy Bucker, MD;  Location: [facility] ORS;  Service: Orthopedics;  Laterality: Left;  ? ? ?There were no vitals filed for this visit. ? ? Subjective Assessment - 10/20/21 1015   ? ? Subjective Patietn reports he had multiple tests performed yesterday and they plan to have an MRI.   ? Currently in Pain? No/denies   ? ?  ?  ? ?  ? ? ? ? ? ? ? ? ? ? ? ? ? ? ? ? ? ? ? ? [facility] Adult PT Treatment/Exercise - 10/20/21 0001   ? ?  ? Lumbar Exercises: Stretches  ? Other Lumbar Stretch Exercise Seated rotation to R, reach hands out and back, repeat to L. 3 x each way. Modified/limited with C/O pain in hip.   ?  ? Lumbar Exercises: Machines for Strengthening  ? Cybex Knee Extension 5# 2 x 10 reps   ? Cybex Knee Flexion 20# 2 x 10   ?  ? Lumbar Exercises: Seated  ? Long [employer] on Chair Strengthening;Both;1 set;10 reps   ? LAQ on Chair Limitations Red Tband   ? Other Seated Lumbar Exercises Clamshells, and then Knee flexion against Red Tband x 10 reps  each.   ? Other Seated Lumbar Exercises Heel raise/Toe raise x 10 reps   ? ?  ?  ? ?  ? ? ? ? ? ? ? ? ? ? ? ? PT Short Term Goals - 10/20/21 1045   ? ?  ? PT SHORT TERM GOAL #1  ? Title Will be compliant with appropriate progressive HEP   ? Baseline Iniitated   ? Time 2   ? Period Weeks   ? Status On-going   ? Target Date 11/03/21   ?  ? PT SHORT TERM GOAL #3  ? Title Will improve step/stride length, toe clearance, and upright posture to show improved gait pattern/mobility   ? Baseline Still catches toes at times.   ? Time 2   ? Period Weeks   ? Status On-going   ? Target Date 11/03/21   ? ?  ?  ? ?  ? ? ? ? PT Long Term Goals - 09/21/21 1745   ? ?  ? PT LONG TERM GOAL #1  ? Title MMT to have improved by at  least 1 grade in all weak groups   ? Status On-going   ? ?  ?  ? ?  ? ? ? ? ? ? ? ? Plan - 10/20/21 1031   ? ? Clinical Impression Statement Patient reports mild dizziness. BP measured 116/68. Treatemtn focused on strength in BLE as well as trunk strength and mobility. He tolerated activities well, required frequent rest breaks, with reports of pain with some of the stretching, so modified.   ? Personal Factors and Comorbidities Age;Fitness;Comorbidity 2   ? Examination-Activity Limitations Locomotion Level;Transfers;Bed Mobility;Squat;Stand   ? Examination-Participation Restrictions Church;Community Activity;Yard Work;Interpersonal Relationship   ? Stability/Clinical Decision Making Evolving/Moderate complexity   ? Clinical Decision Making Moderate   ? Rehab Potential Good   ? PT Frequency 2x / week   ? PT Duration 6 weeks   ? PT Treatment/Interventions ADLs/Self Care Home Management;Biofeedback;Cryotherapy;Electrical Stimulation;Iontophoresis 4mg /ml Dexamethasone;Moist Heat;Ultrasound;DME Instruction;Gait training;Stair training;Functional mobility training;Therapeutic activities;Therapeutic exercise;Balance training;Neuromuscular re-education;Cognitive remediation;Patient/family education;Manual techniques;Dry  needling;Energy conservation;Taping   ? PT Next Visit Plan Stretch for hips   ? Consulted and Agree with Plan of Care Patient   ? ?  ?  ? ?  ? ? ?Patient will benefit from skilled therapeutic intervention in order to improve the following deficits and impairments:  Abnormal gait, Difficulty walking, Increased fascial restricitons, Decreased safety awareness, Dizziness, Decreased activity tolerance, Decreased balance, Hypomobility, Impaired flexibility, Improper body mechanics, Decreased mobility, Decreased strength, Postural dysfunction ? ?Visit Diagnosis: ?Muscle weakness (generalized) ? ?Unsteadiness on feet ? ?Difficulty in walking, not elsewhere classified ? ? ? ? ?Problem List ?Patient Active Problem List  ? Diagnosis Date Noted  ? Chronic post-traumatic stress disorder 08/03/2021  ? 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/2021  ? Impotence 08/03/2021  ? Primary open angle glaucoma, 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  ? Tremor 08/03/2021  ? Memory loss 08/03/2021  ? Gait abnormality 08/03/2021  ? Status post total hip replacement, 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  ? Demand ischemia (HCC) 07/30/2019  ? Anxiety 07/30/2019  ? Acute metabolic encephalopathy 07/08/2019  ? Hematuria 07/08/2019  ? Thrombocytopenia (HCC) 07/08/2019  ? Elevated troponin 07/08/2019  ? Sepsis (HCC) 07/07/2019  ? Essential hypertension 07/07/2019  ? Acute lower UTI 07/07/2019  ? ARF (acute renal failure) (HCC) 07/07/2019  ? ? ?07/09/2019, DPT ?10/20/2021, 10:51 AM ? ?[place] [facility] ?[address] ?[place], [place], [place] ?Phone: [phone]   Fax:  [phone] ? ?Name: Carl Figueroa ?MRN: Jamey Ripa ?Date of Birth: 1946/05/25 ? ? ? ?

## 2021-10-23 ENCOUNTER — Encounter: Payer: Self-pay | Admitting: Physical Therapy

## 2021-10-23 ENCOUNTER — Other Ambulatory Visit: Payer: Self-pay

## 2021-10-23 ENCOUNTER — Ambulatory Visit: Payer: Medicare Other | Admitting: Physical Therapy

## 2021-10-23 ENCOUNTER — Ambulatory Visit: Payer: Medicare Other | Admitting: Occupational Therapy

## 2021-10-23 DIAGNOSIS — R41842 Visuospatial deficit: Secondary | ICD-10-CM

## 2021-10-23 DIAGNOSIS — M6281 Muscle weakness (generalized): Secondary | ICD-10-CM

## 2021-10-23 DIAGNOSIS — R262 Difficulty in walking, not elsewhere classified: Secondary | ICD-10-CM

## 2021-10-23 DIAGNOSIS — R2681 Unsteadiness on feet: Secondary | ICD-10-CM

## 2021-10-23 DIAGNOSIS — R41844 Frontal lobe and executive function deficit: Secondary | ICD-10-CM

## 2021-10-23 DIAGNOSIS — R251 Tremor, unspecified: Secondary | ICD-10-CM

## 2021-10-23 NOTE — Therapy (Signed)
[facility] [address] [place], [place], [address] Phone: [phone]   Fax:  [phone]  Occupational Therapy Treatment  Patient Details  Name: Carl Figueroa MRN: [id number] Date of Birth: August 03, 1946 Referring Provider (OT): Levert Feinstein, MD   Encounter Date: 10/23/2021   OT End of Session - 10/23/21 1536     Visit Number 3    Number of Visits 9    Date for OT Re-Evaluation 01/02/22    Authorization Type Medicare (primary); Tricare (secondary)    Progress Note Due on Visit 10    OT Start Time 1530    OT Stop Time 1615    OT Time Calculation (min) 45 min    Activity Tolerance Patient tolerated treatment well    Behavior During Therapy [facility] for tasks assessed/performed            Past Medical History:  Diagnosis Date   Anxiety    Arthritis    Hypertension     Past Surgical History:  Procedure Laterality Date   COLONOSCOPY     FOOT SURGERY     X2   HERNIA REPAIR Right    INGUINAL    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   Procedure: TOTAL HIP ARTHROPLASTY ANTERIOR APPROACH;  Surgeon: Kennedy Bucker, MD;  Location: [facility] ORS;  Service: Orthopedics;  Laterality: Left;    There were no vitals filed for this visit.   Subjective Assessment - 10/23/21 1529     Subjective  Pt reports that he feels dizzy when he stands and it makes him "feel like something's wrong"; "it gets on my nerves"    Pertinent History Bilateral hand action tremor; memory loss; and gait abnormality (multifactorial, deconditioning, joint pain); PMH includes anxiety, arthritis, HTN, and hx of THA in March 2021    Patient Stated Goals Button and unbutton my shirt; use eating utensils normally as much as possible    Currently in Pain? Yes    Pain Score 7     Pain Location Shoulder    Pain Orientation Right    Pain Descriptors / Indicators Cramping;Squeezing    Pain Relieving Factors Pain-relieving ointment this morning             OT  Treatment - 10/23/21    Shoulder Exercises  Attempted gentle self-stretching in gravity-assisted position supine on mat w/ pillow positioned under lower back and 2 pillows under head; pt unable to tolerate due to back pain and activity was d/c  Closed-chain forward chest press holding 2 lb dowel rod; pt positioned supine w/ pillow under lower back and 2 pillows under head. Completed 1 set of 10 reps w/out difficulty and reported back discomfort during 2nd set; activity was d/c and pt was transitioned to sitting EOM. OT provided verbal cues and visual target for consistency each rep  Re-attempted self-stretching w/ hands clasped while sitting EOM w/ improved results and no report of discomfort. Pt encouraged to gently move through arc of movement within tolerable level of stretch and hold end-range approx 5 sec each rep; completed 5x slowly  Attempted mid-range AAROM of R shoulder abduction w/ cane while sitting EOM to facilitate increased arc of motion and strength; pt unable to tolerate due to discomfort in R anterior UE and exercise was d/c. Also attempted AAROM of R shoulder external rotation w/ cane to similar result; exercise also d/c  Trunk rotation w/ incorporated shoulder horizontal abduction/adduction (UEs crossed at approximately 90 degrees of flexion) completed gently  5x each side while sitting EOM. OT provided demonstration and occasional verbal/tactile cues for positioning            OT Short Term Goals - 10/17/21 1602       OT SHORT TERM GOAL #1   Title Pt will demonstrate understanding of at least 2 memory compensatory strategies during simulated daily activities    Baseline Decreased knowledge of strategies    Time 4    Period Weeks    Status On-going    Target Date 11/03/21             OT Long Term Goals - 10/04/21 1559       OT LONG TERM GOAL #1   Title Pt will be able to button/unbutton at least 5 buttons w/ Mod I, using AE prn    Baseline Unable to fasten  shirt buttons    Time 8    Period Weeks    Status New    Target Date 12/01/21      OT LONG TERM GOAL #2   Title Pt will be independent w/ wear and care of prefab or custom-fit orthosis recommended for hand pain/support, if needed    Baseline Bilateral hand pain    Time 8    Period Weeks    Status New    Target Date 12/01/21      OT LONG TERM GOAL #3   Title Pt will increased R hand 3-point pinch strength by at least 2 lbs to improve participation in functional FM tasks    Baseline 7 lb w/ R hand (9 lb w/ L)    Time 8    Period Weeks    Status New    Target Date 12/01/21      OT LONG TERM GOAL #4   Title Pt will be able to scoop 5/5 beans out of a bowl using spoon w/ or w/out AE incorporated and no drops/spills    Baseline Reports difficulty using eating utensils due to tremor    Time 8    Period Weeks    Status New    Target Date 12/01/21             Plan - 10/23/21 1537     Clinical Impression Statement Considering pt's report of tightness in RUE (indicated R anterior UE and proximally toward anterior shoulder), OT introduced gentle self-stretching as well as strengthening exercises. To focus on gentle shoulder ROM w/ good alignment, OT facilitated exercises in supine, but pt was unable to tolerate position due to back pain and pt was transitioned to sitting EOM. Pt did require UE support/assist from OT during supine-to-sit transition; denied requiring assist at home w/ bed mobility. OT then completed additional stretching sitting EOM w/ improved results and no report of additional back pain; printed HEP provided to pt. OT also briefly discussed pt's report of persistent dizziness, inquiring about when pt first noticed this (reports it was after an ear flush) and when/if he notices any apecific activities or positional changes that improve or worsen his symptoms; unable to identify anything specific. OT recommended pt discuss this w/ his PT w/ pt verbalizing understanding.     OT Occupational Profile and History Detailed Assessment- Review of Records and additional review of physical, cognitive, psychosocial history related to current functional performance    Occupational performance deficits (Please refer to evaluation for details): ADL's;IADL's;Leisure    Body Structure / Function / Physical Skills ADL;GMC;UE functional use;Endurance;Balance;FMC;Mobility;Vision;IADL;Dexterity;Coordination;Body mechanics;Strength    Cognitive  Skills Memory    Rehab Potential Good    Clinical Decision Making Several treatment options, min-mod task modification necessary    Comorbidities Affecting Occupational Performance: Presence of comorbidities impacting occupational performance    Modification or Assistance to Complete Evaluation  Min-Moderate modification of tasks or assist with assess necessary to complete eval    OT Frequency 1x / week    OT Duration 8 weeks    OT Treatment/Interventions Self-care/ADL training;Therapeutic exercise;Energy conservation;Building services engineer;Therapeutic activities;Visual/perceptual remediation/compensation;Balance training;Patient/family education;Cognitive remediation/compensation;Manual Therapy;DME and/or AE instruction;Neuromuscular education;Cryotherapy;Moist Heat    Plan Start w/ wb through BUE for tremor; introduce putty exercises; review/practice memory compensatory strategies; discuss compression glove for pain mgmt   Recommended Other Services Currently receiving PT at this clinic    Consulted and Agree with Plan of Care Patient            Patient will benefit from skilled therapeutic intervention in order to improve the following deficits and impairments:   Body Structure / Function / Physical Skills: ADL, GMC, UE functional use, Endurance, Balance, FMC, Mobility, Vision, IADL, Dexterity, Coordination, Body mechanics, Strength Cognitive Skills: Memory   Visit Diagnosis: Muscle weakness (generalized)  Frontal lobe and  executive function deficit  Tremor  Visuospatial deficit   Problem List Patient Active Problem List   Diagnosis Date Noted   Chronic post-traumatic stress disorder 08/03/2021   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/2021   Impotence 08/03/2021   Primary open angle glaucoma, 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   Tremor 08/03/2021   Memory loss 08/03/2021   Gait abnormality 08/03/2021   Status post total hip replacement, 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   Demand ischemia (HCC) 07/30/2019   Anxiety 07/30/2019   Acute metabolic encephalopathy 07/08/2019   Hematuria 07/08/2019   Thrombocytopenia (HCC) 07/08/2019   Elevated troponin 07/08/2019   Sepsis (HCC) 07/07/2019   Essential hypertension 07/07/2019   Acute lower UTI 07/07/2019   ARF (acute renal failure) (HCC) 07/07/2019    Rosie Fate, MSOT, OTR/L 10/23/2021, 5:31 PM  [facility] [address] [place], [place], [address] Phone: [phone]   Fax:  [phone]  Name: Carl Figueroa MRN: [id number] Date of Birth: 09/08/1945

## 2021-10-23 NOTE — Therapy (Signed)
[place] [facility] ?[address] ?[place], [place], [address] ?Phone: [phone]   Fax:  [phone] ? ?Physical Therapy Treatment ? ?Patient Details  ?Name: Carl Figueroa ?MRN: [id number] ?Date of Birth: 09-21-1945 ?Referring Provider (PT): Carl Figueroa ? ? ?Encounter Date: 10/23/2021 ? ? PT End of Session - 10/23/21 1530   ? ? Visit Number 7   ? Number of Visits 17   ? Date for PT Re-Evaluation 11/09/21   ? Authorization Type MCR and Tricare   ? Authorization Time Period 09/14/21 to 10/19/21   ? Progress Note Due on Visit 10   ? PT Start Time 1502   arrived late  ? PT Stop Time 1528   ? PT Time Calculation (min) 26 min   ? Activity Tolerance Patient tolerated treatment well   ? Behavior During Therapy [facility] for tasks assessed/performed   ? ?  ?  ? ?  ? ? ?Past Medical History:  ?Diagnosis Date  ? Anxiety   ? Arthritis   ? Hypertension   ? ? ?Past Surgical History:  ?Procedure Laterality Date  ? COLONOSCOPY    ? FOOT SURGERY    ? X2  ? HERNIA REPAIR Right   ? INGUINAL   ? 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  ? Procedure: TOTAL HIP ARTHROPLASTY ANTERIOR APPROACH;  Surgeon: Kennedy Bucker, MD;  Location: [facility] ORS;  Service: Orthopedics;  Laterality: Left;  ? ? ?There were no vitals filed for this visit. ? ? Subjective Assessment - 10/23/21 1506   ? ? Subjective I was rushing to try to get here, I feel pretty dizzy right now.   ? Patient Stated Goals get going moving a little better, reduce tremor as much as possible   ? Currently in Pain? Yes   ? Pain Score 7    ? Pain Location Back   ? Pain Orientation Right;Left   ? ?  ?  ? ?  ? ? ? ? ? [facility] PT Assessment - 10/23/21 0001   ? ?  ? Observation/Other Assessments  ? Observations 116/67 HR 74   ? ?  ?  ? ?  ? ? ? ? ? ? ? ? ? ? ? ? ? ? ? ? [facility] Adult PT Treatment/Exercise - 10/23/21 0001   ? ?  ? Lumbar Exercises: Standing  ? Other Standing Lumbar Exercises squat + B march with 5# weights on each leg 1x10; lateral side steps  with 5# weights + butt kicks x10   ? Other Standing Lumbar Exercises farmers carry 6# x278ft   ? ?  ?  ? ?  ? ? ? ? ? ? Balance Exercises - 10/23/21 0001   ? ?  ? Balance Exercises: Standing  ? SLS Eyes open;Solid surface;3 reps   ? Other Standing Exercises toe raises on foam pad; marches on blue foam pad   ? ?  ?  ? ?  ? ? ? ? ? PT Education - 10/23/21 1529   ? ? Education Details normal BP measures   ? Person(s) Educated Patient   ? Methods Explanation   ? Comprehension Verbalized understanding   ? ?  ?  ? ?  ? ? ? PT Short Term Goals - 10/20/21 1045   ? ?  ? PT SHORT TERM GOAL #1  ? Title Will be compliant with appropriate progressive HEP   ? Baseline Iniitated   ? Time 2   ? Period Weeks   ?  Status On-going   ? Target Date 11/03/21   ?  ? PT SHORT TERM GOAL #3  ? Title Will improve step/stride length, toe clearance, and upright posture to show improved gait pattern/mobility   ? Baseline Still catches toes at times.   ? Time 2   ? Period Weeks   ? Status On-going   ? Target Date 11/03/21   ? ?  ?  ? ?  ? ? ? ? PT Long Term Goals - 09/21/21 1745   ? ?  ? PT LONG TERM GOAL #1  ? Title MMT to have improved by at least 1 grade in all weak groups   ? Status On-going   ? ?  ?  ? ?  ? ? ? ? ? ? ? ? Plan - 10/23/21 1530   ? ? Clinical Impression Statement [name] arrived a bit late today, BP was OK so we were still able to work on functional strength and balance this afternoon as much as time allowed. Did need cues for safety and sequencing today more than typical, easily fatigued. Will continue efforts.   ? Personal Factors and Comorbidities Age;Fitness;Comorbidity 2   ? Examination-Activity Limitations Locomotion Level;Transfers;Bed Mobility;Squat;Stand   ? Examination-Participation Restrictions Church;Community Activity;Yard Work;Interpersonal Relationship   ? Stability/Clinical Decision Making Evolving/Moderate complexity   ? Clinical Decision Making Moderate   ? Rehab Potential Good   ? PT Frequency 2x / week    ? PT Duration 6 weeks   ? PT Treatment/Interventions ADLs/Self Care Home Management;Biofeedback;Cryotherapy;Electrical Stimulation;Iontophoresis 4mg /ml Dexamethasone;Moist Heat;Ultrasound;DME Instruction;Gait training;Stair training;Functional mobility training;Therapeutic activities;Therapeutic exercise;Balance training;Neuromuscular re-education;Cognitive remediation;Patient/family education;Manual techniques;Dry needling;Energy conservation;Taping   ? PT Next Visit Plan balance, strength, endurance   ? Consulted and Agree with Plan of Care Patient   ? ?  ?  ? ?  ? ? ?Patient will benefit from skilled therapeutic intervention in order to improve the following deficits and impairments:  Abnormal gait, Difficulty walking, Increased fascial restricitons, Decreased safety awareness, Dizziness, Decreased activity tolerance, Decreased balance, Hypomobility, Impaired flexibility, Improper body mechanics, Decreased mobility, Decreased strength, Postural dysfunction ? ?Visit Diagnosis: ?Muscle weakness (generalized) ? ?Unsteadiness on feet ? ?Difficulty in walking, not elsewhere classified ? ? ? ? ?Problem List ?Patient Active Problem List  ? Diagnosis Date Noted  ? Chronic post-traumatic stress disorder 08/03/2021  ? 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/2021  ? Impotence 08/03/2021  ? Primary open angle glaucoma, 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  ? Tremor 08/03/2021  ? Memory loss 08/03/2021  ? Gait abnormality 08/03/2021  ? Status post total hip replacement, 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  ? Demand ischemia (HCC) 07/30/2019  ? Anxiety 07/30/2019  ? Acute metabolic encephalopathy 07/08/2019  ? Hematuria 07/08/2019  ? Thrombocytopenia (HCC) 07/08/2019  ? Elevated troponin 07/08/2019  ? Sepsis (HCC) 07/07/2019  ? Essential hypertension 07/07/2019  ? Acute lower UTI 07/07/2019  ? ARF (acute renal failure) (HCC) 07/07/2019  ? ?Alliyah Roesler U PT, DPT, PN2  ? ?Supplemental Physical Therapist ?[place]  ? ? ? ? ? ?[place] [facility] ?[address] ?[place], [place], [place] ?Phone: [phone]   Fax:  [phone] ? ?Name: Carl Figueroa ?MRN: Jamey Ripa ?Date of Birth: July 09, 1946 ? ? ? ?

## 2021-10-25 ENCOUNTER — Ambulatory Visit: Payer: Medicare Other | Admitting: Physical Therapy

## 2021-10-31 ENCOUNTER — Other Ambulatory Visit: Payer: Self-pay

## 2021-10-31 ENCOUNTER — Ambulatory Visit: Payer: Medicare Other | Admitting: Physical Therapy

## 2021-10-31 ENCOUNTER — Ambulatory Visit: Payer: Medicare Other | Admitting: Occupational Therapy

## 2021-10-31 ENCOUNTER — Encounter: Payer: Self-pay | Admitting: Physical Therapy

## 2021-10-31 ENCOUNTER — Encounter: Payer: Self-pay | Admitting: Occupational Therapy

## 2021-10-31 DIAGNOSIS — R41842 Visuospatial deficit: Secondary | ICD-10-CM

## 2021-10-31 DIAGNOSIS — R41844 Frontal lobe and executive function deficit: Secondary | ICD-10-CM

## 2021-10-31 DIAGNOSIS — M6281 Muscle weakness (generalized): Secondary | ICD-10-CM

## 2021-10-31 DIAGNOSIS — R251 Tremor, unspecified: Secondary | ICD-10-CM

## 2021-10-31 DIAGNOSIS — R262 Difficulty in walking, not elsewhere classified: Secondary | ICD-10-CM

## 2021-10-31 DIAGNOSIS — R2681 Unsteadiness on feet: Secondary | ICD-10-CM

## 2021-10-31 NOTE — Therapy (Signed)
[place] ?[facility] ?[address] ?[place], [place], [address] ?Phone: [phone]   Fax:  [phone] ? ?Physical Therapy Treatment ? ?Patient Details  ?Name: Carl Figueroa ?MRN: [id number] ?Date of Birth: 1946-01-31 ?Referring Provider (PT): Levert Feinstein ? ? ?Encounter Date: 10/31/2021 ? ? PT End of Session - 10/31/21 1444   ? ? Visit Number 8   ? Number of Visits 17   ? Date for PT Re-Evaluation 11/09/21   ? Authorization Type MCR and Tricare   ? Authorization Time Period 09/14/21 to 10/19/21   ? Progress Note Due on Visit 10   ? PT Start Time 1405   ? PT Stop Time 1443   ? PT Time Calculation (min) 38 min   ? Activity Tolerance Patient tolerated treatment well   ? Behavior During Therapy [facility] for tasks assessed/performed   ? ?  ?  ? ?  ? ? ?Past Medical History:  ?Diagnosis Date  ? Anxiety   ? Arthritis   ? Hypertension   ? ? ?Past Surgical History:  ?Procedure Laterality Date  ? COLONOSCOPY    ? FOOT SURGERY    ? X2  ? HERNIA REPAIR Right   ? INGUINAL   ? 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  ? Procedure: TOTAL HIP ARTHROPLASTY ANTERIOR APPROACH;  Surgeon: Kennedy Bucker, MD;  Location: [facility] ORS;  Service: Orthopedics;  Laterality: Left;  ? ? ?There were no vitals filed for this visit. ? ? Subjective Assessment - 10/31/21 1405   ? ? Subjective I'm feeling dizzy, just a little bit. Nothing else is going on.   ? Patient Stated Goals get going moving a little better, reduce tremor as much as possible   ? Currently in Pain? Yes   ? Pain Score 7    ? Pain Location Hip   ? Pain Orientation Left   ? Pain Descriptors / Indicators Dull   ? ?  ?  ? ?  ? ? ? ? ? [facility] PT Assessment - 10/31/21 0001   ? ?  ? Observation/Other Assessments  ? 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 ?  ?  ? ?  ? ? ? ? ? ? ? ? ? ? ? ? ? ? ? ? [facility] Adult PT Treatment/Exercise - 10/31/21 0001   ? ?  ? Lumbar  Exercises: Aerobic  ? Recumbent Bike L4 x6 minutes   ?  ? Lumbar Exercises: Standing  ? Other Standing Lumbar Exercises standing lumbar rotation stretch x5 B   ? ?  ?  ? ?  ? ? ? ? ? ? Balance Exercises - 10/31/21 0001   ? ?  ? Balance Exercises: Standing  ? Standing Eyes Opened Narrow base of support (BOS);Foam/compliant surface;3 reps;20 secs   ? Tandem Gait Forward;4 reps   ? Retro Gait 4 reps   ? ?  ?  ? ?  ? ? ? ? ? PT Education - 10/31/21 1443   ? ? Education Details VOR exercises   ? Person(s) Educated Patient   ? Methods Explanation;Handout   ? Comprehension Verbalized understanding;Need further instruction   ? ?  ?  ? ?  ? ? ? PT Short Term Goals - 10/20/21 1045   ? ?  ? PT SHORT TERM GOAL #1  ? Title Will be compliant with appropriate progressive HEP   ? Baseline  Iniitated   ? Time 2   ? Period Weeks   ? Status On-going   ? Target Date 11/03/21   ?  ? PT SHORT TERM GOAL #3  ? Title Will improve step/stride length, toe clearance, and upright posture to show improved gait pattern/mobility   ? Baseline Still catches toes at times.   ? Time 2   ? Period Weeks   ? Status On-going   ? Target Date 11/03/21   ? ?  ?  ? ?  ? ? ? ? PT Long Term Goals - 09/21/21 1745   ? ?  ? PT LONG TERM GOAL #1  ? Title MMT to have improved by at least 1 grade in all weak groups   ? Status On-going   ? ?  ?  ? ?  ? ? ? ? ? ? ? ? Plan - 10/31/21 1444   ? ? Clinical Impression Statement Mr. Giller arrives doing OK today, still a bit dizzy. BP 143/72 with check at start of session. We then moved onto checking central and vestibular systems. 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 Otherwise we worked on functional strength, conditioning, and balance today. Will continue to progress as able and tolerated. Gave him some VOR exercises  to work on with his wife today too.   ? Personal Factors and Comorbidities  Age;Fitness;Comorbidity 2   ? Examination-Activity Limitations Locomotion Level;Transfers;Bed Mobility;Squat;Stand   ? Examination-Participation Restrictions Church;Community Activity;Yard Work;Interpersonal Relationship   ? Stability/Clinical Decision Making Evolving/Moderate complexity   ? Clinical Decision Making Moderate   ? Rehab Potential Good   ? PT Frequency 2x / week   ? PT Duration 6 weeks   ? PT Treatment/Interventions ADLs/Self Care Home Management;Biofeedback;Cryotherapy;Electrical Stimulation;Iontophoresis 4mg /ml Dexamethasone;Moist Heat;Ultrasound;DME Instruction;Gait training;Stair training;Functional mobility training;Therapeutic activities;Therapeutic exercise;Balance training;Neuromuscular re-education;Cognitive remediation;Patient/family education;Manual techniques;Dry needling;Energy conservation;Taping   ? PT Next Visit Plan balance, strength, endurance   ? Consulted and Agree with Plan of Care Patient   ? ?  ?  ? ?  ? ? ?Patient will benefit from skilled therapeutic intervention in order to improve the following deficits and impairments:  Abnormal gait, Difficulty walking, Increased fascial restricitons, Decreased safety awareness, Dizziness, Decreased activity tolerance, Decreased balance, Hypomobility, Impaired flexibility, Improper body mechanics, Decreased mobility, Decreased strength, Postural dysfunction ? ?Visit Diagnosis: ?Muscle weakness (generalized) ? ?Difficulty in walking, not elsewhere classified ? ?Unsteadiness on feet ? ? ? ? ?Problem List ?Patient Active Problem List  ? Diagnosis Date Noted  ? Chronic post-traumatic stress disorder 08/03/2021  ? 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/2021  ? Impotence 08/03/2021  ? Primary open angle glaucoma, 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  ? Tremor 08/03/2021  ? Memory loss 08/03/2021  ? Gait abnormality 08/03/2021  ? Status post total hip replacement, 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  ? Demand ischemia (HCC) 07/30/2019  ? Anxiety 07/30/2019  ? Acute metabolic encephalopathy 07/08/2019  ?  Hematuria 07/08/2019  ? Thrombocytopenia (HCC) 07/08/2019  ? Elevated troponin 07/08/2019  ? Sepsis (HCC) 07/07/2019  ? Essential hypertension 07/07/2019  ? Acute lower UTI 07/07/2019  ? ARF (acute renal failure) (HCC) 07/07/2019  ? ?Brittie Whisnant U PT, DPT, PN2  ? ?Supplemental Physical Therapist ?[place]  ? ? ? ? ? ? ?[facility] ?[address] ?[place], [place], [place] ?Phone: [phone]   Fax:  [phone] ? ?Name: Ayodeji Keimig ?MRN: Jamey Ripa ?Date of Birth: May 03, 1946 ? ? ? ?

## 2021-11-01 NOTE — Therapy (Signed)
[place] ?[facility] ?[address] ?[place], [place], [address] ?Phone: [phone]   Fax:  [phone] ? ?Occupational Therapy Treatment ? ?Patient Details  ?Name: Carl Figueroa ?MRN: [id number] ?Date of Birth: 1946-05-28 ?Referring Provider (OT): Levert Feinstein, MD ? ? ?Encounter Date: 10/31/2021 ? ? OT End of Session - 10/31/21 1349   ? ? Visit Number 4   ? Number of Visits 9   ? Date for OT Re-Evaluation 01/02/22   ? Authorization Type Medicare (primary); Tricare (secondary)   ? Progress Note Due on Visit 10   ? OT Start Time 1321   pt arrival time  ? OT Stop Time 1400   ? OT Time Calculation (min) 39 min   ? Activity Tolerance Patient tolerated treatment well   ? Behavior During Therapy [facility] for tasks assessed/performed   ? ?  ?  ? ?  ? ?Past Medical History:  ?Diagnosis Date  ? Anxiety   ? Arthritis   ? Hypertension   ? ? ?Past Surgical History:  ?Procedure Laterality Date  ? COLONOSCOPY    ? FOOT SURGERY    ? X2  ? HERNIA REPAIR Right   ? INGUINAL   ? 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  ? Procedure: TOTAL HIP ARTHROPLASTY ANTERIOR APPROACH;  Surgeon: Kennedy Bucker, MD;  Location: [facility];  Service: Orthopedics;  Laterality: Left;  ? ? ?There were no vitals filed for this visit. ? ? Subjective Assessment - 10/31/21 1326   ? ? Subjective  Pt reports he had a CT scan of his chest and head and they both came back "normal"   ? Pertinent History Bilateral hand action tremor; memory loss; and gait abnormality (multifactorial, deconditioning, joint pain); PMH includes anxiety, arthritis, HTN, and hx of THA in March 2021   ? Patient Stated Goals Button and unbutton my shirt; use eating utensils normally as much as possible   ? Currently in Pain? Yes   ? Pain Score 7    ? Pain Location Hip   ? Pain Orientation Left   ? Pain Descriptors / Indicators Dull   ? Pain Onset In the past 7 days   ? ?  ?  ? ?  ? ? [facility] OT Assessment - 10/31/21 1448   ? ?  ? Cognition  ? Area of  Impairment Memory;Awareness;Problem solving;Attention   ? Current Attention Level Focused;Sustained;Selective   ? Memory Decreased short-term memory   ? Awareness Intellectual   ? Problem Solving Difficulty sequencing;Slow processing   ? Cognition Comments SLUMS: 16/30   ? ?  ?  ? ?  ? ? OT Education - 10/31/21 1353   ? ? Education Details Education provided related to interpretation of results of cognitive screen   ? Person(s) Educated Patient   ? Methods Explanation   ? Comprehension Verbalized understanding   ? ?  ?  ? ?  ? ? OT Treatment - 10/31/21  ? ?  ? Power Grip Exerciser Picking up 1" blocks w/ power grip exerciser and placing in a container to facilitate GMC and hand strengthening; pt completed 15 blocks w/ each hand x2 and no drops ?  ? Adaptive Equipment Trialed large grip weighted spoon: transferring scoop of water to a cup and then transferring dried beans one at a time from bowl to a container. Completed w/ decreased spills and decreased time when using AE vs standard spoon  ? ?  ?  ? ?  ? ?  OT Short Term Goals - 10/17/21 1602   ? ?  ? OT SHORT TERM GOAL #1  ? Title Pt will demonstrate understanding of at least 2 memory compensatory strategies during simulated daily activities   ? Baseline Decreased knowledge of strategies   ? Time 4   ? Period Weeks   ? Status On-going   ? Target Date 11/03/21   ? ?  ?  ? ?  ? ? OT Long Term Goals - 10/04/21 1559   ? ?  ? OT LONG TERM GOAL #1  ? Title Pt will be able to button/unbutton at least 5 buttons w/ Mod I, using AE prn   ? Baseline Unable to fasten shirt buttons   ? Time 8   ? Period Weeks   ? Status New   ? Target Date 12/01/21   ?  ? OT LONG TERM GOAL #2  ? Title Pt will be independent w/ wear and care of prefab or custom-fit orthosis recommended for hand pain/support, if needed   ? Baseline Bilateral hand pain   ? Time 8   ? Period Weeks   ? Status New   ? Target Date 12/01/21   ?  ? OT LONG TERM GOAL #3  ? Title Pt will increased R hand 3-point pinch  strength by at least 2 lbs to improve participation in functional FM tasks   ? Baseline 7 lb w/ R hand (9 lb w/ L)   ? Time 8   ? Period Weeks   ? Status New   ? Target Date 12/01/21   ?  ? OT LONG TERM GOAL #4  ? Title Pt will be able to scoop 5/5 beans out of a bowl using spoon w/ or w/out AE incorporated and no drops/spills   ? Baseline Reports difficulty using eating utensils due to tremor   ? Time 8   ? Period Weeks   ? Status New   ? Target Date 12/01/21   ? ?  ?  ? ?  ? ? Plan - 10/31/21 1329   ? ? Clinical Impression Statement OT completed short cognitive screen (SLUMS Exam) due to observed difficulty w/ executive functioning and pt's report of difficulty w/ memory. Interpretation of screen indicated most difficulty w/ delayed recall, free recall, registration and executive functioning, and visuospatial skills. OT discussed this w/ pt and potential impacts on functional activities (e.g., med management, recall of MD appts) w/ pt verbalizing understanding. OT then continued to address tremor and hand weakness w/ light strengthening and trial of AE (built-up weighted handles); pt did well w/ AE and will benefit from continued practice in OT.  ? OT Occupational Profile and History Detailed Assessment- Review of Records and additional review of physical, cognitive, psychosocial history related to current functional performance   ? Occupational performance deficits (Please refer to evaluation for details): ADL's;IADL's;Leisure   ? Body Structure / Function / Physical Skills ADL;GMC;UE functional use;Endurance;Balance;FMC;Mobility;Vision;IADL;Dexterity;Coordination;Body mechanics;Strength   ? Cognitive Skills Memory   ? Rehab Potential Good   ? Clinical Decision Making Several treatment options, min-mod task modification necessary   ? Comorbidities Affecting Occupational Performance: Presence of comorbidities impacting occupational performance   ? Modification or Assistance to Complete Evaluation  Min-Moderate  modification of tasks or assist with assess necessary to complete eval   ? OT Frequency 1x / week   ? OT Duration 8 weeks   ? OT Treatment/Interventions Self-care/ADL training;Therapeutic exercise;Energy conservation;Building services engineer;Therapeutic activities;Visual/perceptual remediation/compensation;Balance training;Patient/family education;Cognitive remediation/compensation;Manual  Therapy;DME and/or AE instruction;Neuromuscular education;Cryotherapy;Moist Heat   ? Plan Start w/ wb through BUE for tremor; introduce putty exercises; practice memory compensatory strategies; compression glove for pain mgmt?  ? Recommended Other Services Currently receiving PT at this clinic   ? Consulted and Agree with Plan of Care Patient   ? ?  ?  ? ?  ? ?Patient will benefit from skilled therapeutic intervention in order to improve the following deficits and impairments:   ?Body Structure / Function / Physical Skills: ADL, GMC, UE functional use, Endurance, Balance, FMC, Mobility, Vision, IADL, Dexterity, Coordination, Body mechanics, Strength ?Cognitive Skills: Memory ? ? ?Visit Diagnosis: ?Tremor ? ?Muscle weakness (generalized) ? ?Frontal lobe and executive function deficit ? ?Visuospatial deficit ? ? ?Problem List ?Patient Active Problem List  ? Diagnosis Date Noted  ? Chronic post-traumatic stress disorder 08/03/2021  ? 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/2021  ? Impotence 08/03/2021  ? Primary open angle glaucoma, 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  ? Tremor 08/03/2021  ? Memory loss 08/03/2021  ? Gait abnormality 08/03/2021  ? Status post total hip replacement, 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  ? Demand ischemia (HCC) 07/30/2019  ? Anxiety 07/30/2019  ? Acute metabolic encephalopathy 07/08/2019  ? Hematuria 07/08/2019  ? Thrombocytopenia (HCC) 07/08/2019  ? Elevated troponin 07/08/2019  ? Sepsis (HCC) 07/07/2019  ? Essential hypertension 07/07/2019  ? Acute lower UTI 07/07/2019  ? ARF (acute renal failure) (HCC) 07/07/2019  ? ? ?Rosie FateKelsey Vermelle Cammarata, MSOT, OTR/L ?10/31/2021,  2:51 PM ? ?[place] ?[facility] ?[id number] ?[place], [place], [address] ?Phone: [phone]   Fax:  [phone] ? ?Name: Carl Figueroa ?MRN: [id number] ?Dat

## 2021-11-02 ENCOUNTER — Encounter: Payer: Self-pay | Admitting: Physical Therapy

## 2021-11-02 ENCOUNTER — Ambulatory Visit: Payer: Medicare Other | Admitting: Adult Health

## 2021-11-02 ENCOUNTER — Ambulatory Visit: Payer: Medicare Other | Admitting: Physical Therapy

## 2021-11-02 ENCOUNTER — Other Ambulatory Visit: Payer: Self-pay

## 2021-11-02 DIAGNOSIS — M6281 Muscle weakness (generalized): Secondary | ICD-10-CM | POA: Diagnosis not present

## 2021-11-02 NOTE — Therapy (Signed)
[place] ?[facility] ?[address] ?[place], [place], [address] ?Phone: [phone]   Fax:  [phone] ? ?Physical Therapy Treatment ? ?Patient Details  ?Name: Carl Figueroa ?MRN: [id number] ?Date of Birth: 1946-04-25 ?Referring Provider (PT): Levert Feinstein ? ? ?Encounter Date: 11/02/2021 ? ? PT End of Session - 11/02/21 1400   ? ? Visit Number 9   ? Number of Visits 17   ? Date for PT Re-Evaluation 11/09/21   ? Authorization Type MCR and Tricare   ? Authorization Time Period 09/14/21 to 10/19/21   ? Progress Note Due on Visit 10   ? PT Start Time 1317   ? PT Stop Time 1359   ? PT Time Calculation (min) 42 min   ? Activity Tolerance Patient tolerated treatment well   ? Behavior During Therapy [facility] for tasks assessed/performed   ? ?  ?  ? ?  ? ? ?Past Medical History:  ?Diagnosis Date  ? Anxiety   ? Arthritis   ? Hypertension   ? ? ?Past Surgical History:  ?Procedure Laterality Date  ? COLONOSCOPY    ? FOOT SURGERY    ? X2  ? HERNIA REPAIR Right   ? INGUINAL   ? 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  ? Procedure: TOTAL HIP ARTHROPLASTY ANTERIOR APPROACH;  Surgeon: Kennedy Bucker, MD;  Location: [facility] OR[facility];  Service: Orthopedics;  Laterality: Left;  ? ? ?There were no vitals filed for this visit. ? ? Subjective Assessment - 11/02/21 1323   ? ? Subjective I worked on those exercises you gave me, I told my doctor too and they are scheduling a head MRI just to make sure nothing is going on   ? ?  ?  ? ?  ? Pain 6/10 B hips stiff/spasm, dull tooth ache  ? ? ? ? [facility] PT Assessment - 11/02/21 0001   ? ?  ? Observation/Other Assessments  ? Observations 144/84 HR 63   ? ?  ?  ? ?  ? ? ? ? ? ? ? ? ? ? ? ? ? ? ? ? [facility] Adult PT Treatment/Exercise - 11/02/21 0001   ? ?  ? Lumbar Exercises: Stretches  ? Active Hamstring Stretch Right;Left;30 seconds;2 reps   ? Piriformis Stretch Right;Left;2 reps;30 seconds   ? Other Lumbar Stretch Exercise hip flexor stretch 2x30 seconds B   ?  ? Lumbar  Exercises: Aerobic  ? Recumbent Bike L4 x6 minutes   BLEs only  ?  ? Lumbar Exercises: Seated  ? Other Seated Lumbar Exercises horizontal VORs x60 seconds 3 rounds first at self selected pace, then at 50-60BPM, then 60BPM metronome   ? Other Seated Lumbar Exercises VOR cancellation 2x10   ? ?  ?  ? ?  ? ? ? ? ? ? ? ? ? ? PT Education - 11/02/21 1400   ? ? Education Details please schedule more appts- PT 2x/week, OT 1x/week   ? Person(s) Educated Patient   ? Methods Explanation   ? Comprehension Verbalized understanding   ? ?  ?  ? ?  ? ? ? PT Short Term Goals - 10/20/21 1045   ? ?  ? PT SHORT TERM GOAL #1  ? Title Will be compliant with appropriate progressive HEP   ? Baseline Iniitated   ? Time 2   ? Period Weeks   ? Status On-going   ? Target Date 11/03/21   ?  ? PT SHORT TERM  GOAL #3  ? Title Will improve step/stride length, toe clearance, and upright posture to show improved gait pattern/mobility   ? Baseline Still catches toes at times.   ? Time 2   ? Period Weeks   ? Status On-going   ? Target Date 11/03/21   ? ?  ?  ? ?  ? ? ? ? PT Long Term Goals - 09/21/21 1745   ? ?  ? PT LONG TERM GOAL #1  ? Title MMT to have improved by at least 1 grade in all weak groups   ? Status On-going   ? ?  ?  ? ?  ? ? ? ? ? ? ? ? Plan - 11/02/21 1401   ? ? Clinical Impression Statement Mr. Foisy arrives doing OK today, his wife helped him with VOR exercises and his dizziness is a little better. BP 144/84 today. We warmed up on the Nustep, then worked on more stretching and vestibular based interventions as mm spasms and dizziness were his main complaints today. Will need progress note next session. I think he likely has made some progress, note we did have some complications with low then high BP values that did limit frequency and intensity of treatments recently though.   ? Personal Factors and Comorbidities Age;Fitness;Comorbidity 2   ? Examination-Activity Limitations Locomotion Level;Transfers;Bed Mobility;Squat;Stand   ?  Examination-Participation Restrictions Church;Community Activity;Yard Work;Interpersonal Relationship   ? Stability/Clinical Decision Making Evolving/Moderate complexity   ? Clinical Decision Making Moderate   ? Rehab Potential Good   ? PT Frequency 2x / week   ? PT Duration 6 weeks   ? PT Treatment/Interventions ADLs/Self Care Home Management;Biofeedback;Cryotherapy;Electrical Stimulation;Iontophoresis 4mg /ml Dexamethasone;Moist Heat;Ultrasound;DME Instruction;Gait training;Stair training;Functional mobility training;Therapeutic activities;Therapeutic exercise;Balance training;Neuromuscular re-education;Cognitive remediation;Patient/family education;Manual techniques;Dry needling;Energy conservation;Taping   ? PT Next Visit Plan progress note   ? Consulted and Agree with Plan of Care Patient   ? Family Member Consulted spouse   ? ?  ?  ? ?  ? ? ?Patient will benefit from skilled therapeutic intervention in order to improve the following deficits and impairments:  Abnormal gait, Difficulty walking, Increased fascial restricitons, Decreased safety awareness, Dizziness, Decreased activity tolerance, Decreased balance, Hypomobility, Impaired flexibility, Improper body mechanics, Decreased mobility, Decreased strength, Postural dysfunction ? ?Visit Diagnosis: ?Muscle weakness (generalized) ? ?Difficulty in walking, not elsewhere classified ? ?Unsteadiness on feet ? ? ? ? ?Problem List ?Patient Active Problem List  ? Diagnosis Date Noted  ? Chronic post-traumatic stress disorder 08/03/2021  ? 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/2021  ? Impotence 08/03/2021  ? Primary open angle glaucoma, 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  ? Tremor 08/03/2021  ? Memory loss 08/03/2021  ? Gait abnormality 08/03/2021  ? Status post total hip replacement, 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  ? Demand ischemia (HCC) 07/30/2019  ? Anxiety 07/30/2019  ? Acute metabolic encephalopathy 07/08/2019  ? Hematuria 07/08/2019  ? Thrombocytopenia (HCC) 07/08/2019  ? Elevated troponin 07/08/2019  ? Sepsis  (HCC) 07/07/2019  ? Essential hypertension 07/07/2019  ? Acute lower UTI 07/07/2019  ? ARF (acute renal failure) (HCC) 07/07/2019  ? ?Curtez Brallier U PT, DPT, PN2  ? ?Supplemental Physical Therapist ?[place]  ? ? ? ? ? ?[place] ?[facility] ?[address] ?[place], [place], [place] ?Phone: [phone]   Fax:  [phone] ? ?Name: Windel Keziah ?MRN: Jamey Ripa ?Date of Birth: June 17, 1946 ? ? ? ?

## 2021-11-15 ENCOUNTER — Encounter: Payer: Self-pay | Admitting: Adult Health

## 2021-11-15 ENCOUNTER — Telehealth: Payer: Self-pay | Admitting: Neurology

## 2021-11-15 ENCOUNTER — Ambulatory Visit (INDEPENDENT_AMBULATORY_CARE_PROVIDER_SITE_OTHER): Payer: Medicare Other | Admitting: Adult Health

## 2021-11-15 VITALS — BP 117/70 | HR 65 | Ht 67.0 in | Wt 167.0 lb

## 2021-11-15 DIAGNOSIS — R413 Other amnesia: Secondary | ICD-10-CM

## 2021-11-15 DIAGNOSIS — R251 Tremor, unspecified: Secondary | ICD-10-CM

## 2021-11-15 DIAGNOSIS — R269 Unspecified abnormalities of gait and mobility: Secondary | ICD-10-CM | POA: Diagnosis not present

## 2021-11-15 NOTE — Telephone Encounter (Signed)
Change his follow up appt with me in 6 months instead of Jessica, see addendum at today's office note ?

## 2021-11-15 NOTE — Telephone Encounter (Signed)
Pt's wife changed his appt to Dr. Terrace Arabia at checkout today. ?

## 2021-11-15 NOTE — Progress Notes (Signed)
?[facility] ?[id number] ?[place]. [place] [address] ?[phone] ? ?     OFFICE FOLLOW UP NOTE ? ?[name] ?Date of Birth:  [birth year] ?Medical Record Number:  [id number]  ? ?Reason for visit: Tremor and memory loss follow-up ? ? ? ?SUBJECTIVE: ? ? ?CHIEF COMPLAINT:  ?Chief Complaint  ?Patient presents with  ? Follow-up  ?  RM 2 alone ?Pt is well, states memory is stable but tremors has worsen.   ? ? ?HPI:  ? ?UPDATE 11/15/2021 JM: Patient returns for 83-month follow-up unaccompanied.   ? ?Reports cognition has been stable since prior visit.  Remains on Namenda 10 mg twice daily and Aricept 10mg  daily, denies side effects.  ? ?Is concerned that tremors continue to fluctuate (but denies actual overall worsening after further conversation) - will have difficulty with eating/drinking such as holding a utensil or cup and can have overall worsening with increased anxiety especially going to restaurants as he is embarrassed about this tremors. He is on clonazepam per psychiatry for underlying anxiety - he has used prior to going out to restaurants which is helpful. He does have a dull pain sensation in right outer bicep - can worsen with lifting or pushing, this has been going on for a while.  ? ?[facility] and tremor complaints. ? ?[facility] which was completed on 3/9 for memory loss and "feels 'out of sorts'" which showed mild parietal and temporal lobe atrophy with no acute intracranial pathology. ? ? ? ? ? ? ?Copy from Dr. 5/9 initial consult visit on 08/03/2021 for reference purposes only ?Carl Figueroa is a 76 year old male, seen in request by his primary care PA 61, for evaluation of tremor, memory loss, initial evaluation was on August 03, 2021 with his wife ?  ?I reviewed and summarized the referring note. PMHX ?HTN ?HLD ?Chronic insomnia ?Depression, Anxiety ?Memory loss ?Left hip  replacment ?Hx of left foot surgery ?  ?He will reported long history of PTSD, depression anxiety, is at the treatment of [facility], taking Effexor 300 mg daily, clonazepam as needed ? ?He retired from post office at age 24, had more than 5 years history of gradual onset bilateral hands tremor, involving both hands, he could no longer using tools such as screwdriver, difficulty holding utensils, ? ?He denied loss sense of smell, has difficulty sleeping, frequent flashback, PTSD, moves a lot during sleep, sometimes even jump out of the bed from his nightmare, ?  ?He has gait abnormality, he contributed to his hip pain, chronic knee pain ? ?He also was noted to have gradual worsening memory loss since 2017, MoCA examination 14/30 today, ?  ?I personally reviewed MRI of the brain without contrast December 2020, no acute abnormality, age-appropriate generalized atrophy ? ?MRI of lumbar spine April 2022, multilevel degenerative changes, moderate to severe spinal stenosis L2-3, severe spinal stenosis at L3-4, L4-5, severe subarticular and foraminal stenosis, compression of L4 nerve roots and bilateral foraminal ? ? ? ? ?ROS:   ?14 system review of systems performed and negative with exception of those listed in HPI ? ?PMH:  ?Past Medical History:  ?Diagnosis Date  ? Anxiety   ? Arthritis   ? Hypertension   ? ? ?PSH:  ?Past Surgical History:  ?Procedure Laterality Date  ? COLONOSCOPY    ? FOOT SURGERY    ? X2  ? HERNIA REPAIR Right   ?  INGUINAL   ? 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  ? Procedure: TOTAL HIP ARTHROPLASTY ANTERIOR APPROACH;  Surgeon: Kennedy Bucker, MD;  Location: [facility] ORS;  Service: Orthopedics;  Laterality: Left;  ? ? ?Social History:  ?Social History  ? ?Socioeconomic History  ? Marital status: Married  ?  Spouse name: Not on file  ? Number of children: Not on file  ? Years of education: Not on file  ? Highest education level: Not on file  ?Occupational History  ? Not on file  ?Tobacco Use  ? Smoking  status: Former  ?  Packs/day: 0.25  ?  Years: 20.00  ?  Pack years: 5.00  ?  Types: Cigarettes  ?  Quit date: 10/28/2019  ?  Years since quitting: 2.0  ? Smokeless tobacco: Never  ?Vaping Use  ? Vaping Use: Never used  ?Substance and Sexual Activity  ? Alcohol use: No  ? Drug use: No  ? Sexual activity: Not on file  ?Other Topics Concern  ? Not on file  ?Social History Narrative  ? Not on file  ? ?Social Determinants of Health  ? ?Financial Resource Strain: Not on file  ?Food Insecurity: Not on file  ?Transportation Needs: Not on file  ?Physical Activity: Not on file  ?Stress: Not on file  ?Social Connections: Not on file  ?Intimate Partner Violence: Not on file  ? ? ?Family History:  ?Family History  ?Problem Relation Age of Onset  ? Cancer Mother   ? Diabetes Mellitus II Neg Hx   ? ? ?Medications:   ?Current Outpatient Medications on File Prior to Visit  ?Medication Sig Dispense Refill  ? atorvastatin (LIPITOR) 40 MG tablet Take 1 tablet (40 mg total) by mouth daily. (Patient taking differently: Take 40 mg by mouth every morning.) 30 tablet 1  ? clonazePAM (KLONOPIN) 0.5 MG tablet Take 0.5 tablets (0.25 mg total) by mouth 3 (three) times daily. (Patient taking differently: Take 0.25 mg by mouth at bedtime.) 12 tablet 0  ? donepezil (ARICEPT) 10 MG tablet Take 10 mg by mouth every morning.     ? lisinopril (ZESTRIL) 40 MG tablet Take 20 mg by mouth every morning.     ? memantine (NAMENDA) 10 MG tablet Take 1 tablet (10 mg total) by mouth 2 (two) times daily. 60 tablet 11  ? venlafaxine XR (EFFEXOR-XR) 150 MG 24 hr capsule Take 300 mg by mouth daily with breakfast.     ? ?No current facility-administered medications on file prior to visit.  ? ? ?Allergies:   ?Allergies  ?Allergen Reactions  ? Morphine Nausea Only  ? ? ? ? ?OBJECTIVE: ? ?Physical Exam ? ?Vitals:  ? 11/15/21 1109  ?BP: 117/70  ?Pulse: 65  ?Weight: 167 lb (75.8 kg)  ?Height: 5\' 7"  (1.702 m)  ? ?Body mass index is 26.16 kg/m? ?No results  found. ? ?General: well developed, well nourished, very pleasant elderly AA male, seated, in no evident distress ?Head: head normocephalic and atraumatic.   ?Neck: supple with no carotid or supraclavicular bruits ?Cardiovascular: regular rate and rhythm, no murmurs ?Musculoskeletal: no deformity ?Skin:  no rash/petichiae ?Vascular:  Normal pulses all extremities ?  ?Neurologic Exam ?Mental Status: Awake and fully alert. Oriented to place and time. Recent and remote memory intact. Attention span, concentration and fund of knowledge appropriate. Mood and affect appropriate.  ?Cranial Nerves: Pupils equal, briskly reactive to light. Extraocular movements full without nystagmus. Visual fields full to confrontation. Hearing intact. Facial sensation intact. Face, tongue,  palate moves normally and symmetrically.  ?Motor: Normal bulk and tone. Normal strength in all tested extremity muscles. Bilateral hand posturing tremor, no evidence of rigidity, bradykinesia or resting tremor  ?Sensory.: intact to touch , pinprick , position and vibratory sensation.  ?Coordination: Rapid alternating movements normal in all extremities. Finger-to-nose and heel-to-shin performed accurately bilaterally. ?Gait and Station: Arises from chair without difficulty. Stance is slightly hunched. Wide based gait and decreased stride length and step height with mild unsteadiness with use of cane ?Reflexes: 1+ and symmetric. Toes downgoing.  ? ? ? ? ? ? ? ?ASSESSMENT: Carl Figueroa is a 76 y.o. 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  ? ? ? ? ?PLAN: ? ?Memory loss: stable. Continue Namenda and Aricept. Will plan on repeat MOCA at follow up visit ?Tremor: stable although interferes with eating/drinking and worsens with anxiety. Discussed use of weighted pens and utensils for possible benefit. Continue clonazepam as needed for worsening tremor with anxiety - managed  by psychiatry. Continue OT. Will discuss if any other treatment options are available with Dr. Terrace ArabiaYan but suspect risk would outweigh benefit at this present time ?Gait abnormality: multifactorial. Continue working with t

## 2021-11-15 NOTE — Patient Instructions (Signed)
Your Plan: ? ?Continue current treatment plan ? ?Keep working with therapies ? ?Will discuss other options for tremors with Dr. Krista Blue and we will let you know via MyChart if she has any other recommendations ? ? ? ?Follow up in 6 months or call earlier if needed ? ? ? ? ?Thank you for coming to see [place] at [facility]. I hope we have been able to provide you high quality care today. ? ?You may receive a patient satisfaction survey over the next few weeks. We would appreciate your feedback and comments so that we may continue to improve ourselves and the health of our patients. ? ?

## 2021-11-22 NOTE — Progress Notes (Signed)
Chart reviewed, agree above plan ?

## 2022-01-11 ENCOUNTER — Emergency Department (HOSPITAL_COMMUNITY): Payer: No Typology Code available for payment source

## 2022-01-11 ENCOUNTER — Other Ambulatory Visit: Payer: Self-pay

## 2022-01-11 ENCOUNTER — Emergency Department (HOSPITAL_COMMUNITY)
Admission: EM | Admit: 2022-01-11 | Discharge: 2022-01-12 | Disposition: A | Discharge status: Home / Self Care | Payer: No Typology Code available for payment source | Attending: Emergency Medicine | Admitting: Emergency Medicine

## 2022-01-11 ENCOUNTER — Encounter (HOSPITAL_COMMUNITY): Payer: Self-pay

## 2022-01-11 DIAGNOSIS — Z046 Encounter for general psychiatric examination, requested by authority: Secondary | ICD-10-CM | POA: Insufficient documentation

## 2022-01-11 DIAGNOSIS — R4182 Altered mental status, unspecified: Secondary | ICD-10-CM | POA: Insufficient documentation

## 2022-01-11 DIAGNOSIS — Z644 Discord with counselors: Secondary | ICD-10-CM | POA: Insufficient documentation

## 2022-01-11 DIAGNOSIS — F419 Anxiety disorder, unspecified: Secondary | ICD-10-CM | POA: Diagnosis not present

## 2022-01-11 DIAGNOSIS — F329 Major depressive disorder, single episode, unspecified: Secondary | ICD-10-CM | POA: Diagnosis present

## 2022-01-11 DIAGNOSIS — F22 Delusional disorders: Secondary | ICD-10-CM | POA: Diagnosis not present

## 2022-01-11 DIAGNOSIS — Z20822 Contact with and (suspected) exposure to covid-19: Secondary | ICD-10-CM | POA: Insufficient documentation

## 2022-01-11 DIAGNOSIS — Z79899 Other long term (current) drug therapy: Secondary | ICD-10-CM | POA: Insufficient documentation

## 2022-01-11 DIAGNOSIS — Z658 Other specified problems related to psychosocial circumstances: Secondary | ICD-10-CM

## 2022-01-11 DIAGNOSIS — F4312 Post-traumatic stress disorder, chronic: Secondary | ICD-10-CM | POA: Diagnosis not present

## 2022-01-11 LAB — URINALYSIS, ROUTINE W REFLEX MICROSCOPIC
Bacteria, UA: NONE SEEN
Bilirubin Urine: NEGATIVE
Glucose, UA: NEGATIVE mg/dL
Hgb urine dipstick: NEGATIVE
Ketones, ur: NEGATIVE mg/dL
Nitrite: NEGATIVE
Protein, ur: NEGATIVE mg/dL
Specific Gravity, Urine: 1.011 (ref 1.005–1.030)
pH: 5 (ref 5.0–8.0)

## 2022-01-11 LAB — COMPREHENSIVE METABOLIC PANEL
ALT: 10 U/L (ref 0–44)
AST: 15 U/L (ref 15–41)
Albumin: 3.9 g/dL (ref 3.5–5.0)
Alkaline Phosphatase: 60 U/L (ref 38–126)
Anion gap: 4 — ABNORMAL LOW (ref 5–15)
BUN: 14 mg/dL (ref 8–23)
CO2: 28 mmol/L (ref 22–32)
Calcium: 9.6 mg/dL (ref 8.9–10.3)
Chloride: 112 mmol/L — ABNORMAL HIGH (ref 98–111)
Creatinine, Ser: 1.3 mg/dL — ABNORMAL HIGH (ref 0.61–1.24)
GFR, Estimated: 57 mL/min — ABNORMAL LOW (ref >60)
Glucose, Bld: 101 mg/dL — ABNORMAL HIGH (ref 70–99)
Potassium: 4.2 mmol/L (ref 3.5–5.1)
Sodium: 144 mmol/L (ref 135–145)
Total Bilirubin: 0.8 mg/dL (ref 0.3–1.2)
Total Protein: 6.8 g/dL (ref 6.5–8.1)

## 2022-01-11 LAB — CBC
HCT: 38.7 % — ABNORMAL LOW (ref 39.0–52.0)
Hemoglobin: 13 g/dL (ref 13.0–17.0)
MCH: 29.5 pg (ref 26.0–34.0)
MCHC: 33.6 g/dL (ref 30.0–36.0)
MCV: 87.8 fL (ref 80.0–100.0)
Platelets: 167 10*3/uL (ref 150–400)
RBC: 4.41 MIL/uL (ref 4.22–5.81)
RDW: 12.8 % (ref 11.5–15.5)
WBC: 4.7 10*3/uL (ref 4.0–10.5)
nRBC: 0 % (ref 0.0–0.2)

## 2022-01-11 LAB — RESP PANEL BY RT-PCR (FLU A&B, COVID) ARPGX2
Influenza A by PCR: NEGATIVE
Influenza B by PCR: NEGATIVE
SARS Coronavirus 2 by RT PCR: NEGATIVE

## 2022-01-11 LAB — RAPID URINE DRUG SCREEN, HOSP PERFORMED
Amphetamines: NOT DETECTED
Barbiturates: NOT DETECTED
Benzodiazepines: NOT DETECTED
Cocaine: NOT DETECTED
Opiates: NOT DETECTED
Tetrahydrocannabinol: NOT DETECTED

## 2022-01-11 LAB — ETHANOL: Alcohol, Ethyl (B): <10 mg/dL (ref <10)

## 2022-01-11 MED ORDER — CLONAZEPAM 0.125 MG PO TBDP
0.2500 mg | ORAL_TABLET | Freq: Three times a day (TID) | ORAL | Status: DC | PRN
Start: 2022-01-11 — End: 2022-01-12

## 2022-01-11 MED ORDER — VENLAFAXINE HCL ER 75 MG PO CP24
300.0000 mg | ORAL_CAPSULE | Freq: Every day | ORAL | Status: DC
  Administered 2022-01-12: 300 mg via ORAL
  Filled 2022-01-11: qty 4

## 2022-01-11 MED ORDER — LISINOPRIL 10 MG PO TABS
20.0000 mg | ORAL_TABLET | ORAL | Status: DC
  Administered 2022-01-12: 20 mg via ORAL
  Filled 2022-01-11: qty 2

## 2022-01-11 MED ORDER — DONEPEZIL HCL 5 MG PO TABS
10.0000 mg | ORAL_TABLET | ORAL | Status: DC
  Administered 2022-01-12: 10 mg via ORAL
  Filled 2022-01-11: qty 2

## 2022-01-11 MED ORDER — MEMANTINE HCL 5 MG PO TABS
10.0000 mg | ORAL_TABLET | Freq: Two times a day (BID) | ORAL | Status: DC
  Administered 2022-01-11 – 2022-01-12 (×2): 10 mg via ORAL
  Filled 2022-01-11 (×3): qty 2

## 2022-01-11 NOTE — ED Provider Notes (Signed)
[facility]-EMERGENCY DEPT Provider Note   CSN: [id number] Arrival date & time: 01/11/22  1254     History  Chief Complaint  Patient presents with   IVC    Carl Figueroa is a 76 y.o. male.  Patient presents with [employer] after being IVC'd by spouse. Spouse/ivc indicates patient with paranoid thoughts of people not there being out to get him, seeing people not there, wandering away from house, not sleeping at night, restless/anxious.  Pt limited historian  - states he feels fine and that his wife is the problem. Pt indicates he is compliant w his home meds. Denies any specific pain or other c/o. No headache. No chest pain or sob. No abd pain or nvd. No dysuria. No trauma/fall. Denies fever or chills. Pt denies thoughts of harm to self or others.   The history is provided by the patient, medical records and the police.      Home Medications Prior to Admission medications   Medication Sig Start Date End Date Taking? Authorizing Provider  atorvastatin (LIPITOR) 40 MG tablet Take 1 tablet (40 mg total) by mouth daily. Patient taking differently: Take 40 mg by mouth every morning. 07/09/19 11/15/21  Lorretta Harp, MD  clonazePAM (KLONOPIN) 0.5 MG tablet Take 0.5 tablets (0.25 mg total) by mouth 3 (three) times daily. Patient taking differently: Take 0.25 mg by mouth at bedtime. 07/29/19   End, Cristal Deer, MD  donepezil (ARICEPT) 10 MG tablet Take 10 mg by mouth every morning.  07/29/19   [provider]  lisinopril (ZESTRIL) 40 MG tablet Take 20 mg by mouth every morning.     [provider]  memantine (NAMENDA) 10 MG tablet Take 1 tablet (10 mg total) by mouth 2 (two) times daily. 08/03/21   Levert Feinstein, MD  venlafaxine XR (EFFEXOR-XR) 150 MG 24 hr capsule Take 300 mg by mouth daily with breakfast.  08/04/19   [provider]      Allergies    Morphine    Review of Systems   Review of Systems  Constitutional:  Negative for chills and  fever.  HENT:  Negative for sore throat.   Eyes:  Negative for redness.  Respiratory:  Negative for shortness of breath.   Cardiovascular:  Negative for chest pain.  Gastrointestinal:  Negative for abdominal pain and vomiting.  Genitourinary:  Negative for dysuria and flank pain.  Musculoskeletal:  Negative for back pain and neck pain.  Skin:  Negative for rash.  Neurological:  Negative for headaches.  Hematological:  Does not bruise/bleed easily.  Psychiatric/Behavioral:  The patient is nervous/anxious.    Physical Exam Updated Vital Signs BP (!) 145/70 (BP Location: Left Arm)   Pulse 73   Temp 98.2 F (36.8 C) (Oral)   Resp 17   Ht 1.702 m (5\' 7" )   Wt 79.4 kg   SpO2 98%   BMI 27.41 kg/m  Physical Exam Vitals and nursing note reviewed.  Constitutional:      Appearance: Normal appearance. He is well-developed.  HENT:     Head: Atraumatic.     Nose: Nose normal.     Mouth/Throat:     Mouth: Mucous membranes are moist.     Pharynx: Oropharynx is clear.  Eyes:     General: No scleral icterus.    Conjunctiva/sclera: Conjunctivae normal.     Pupils: Pupils are equal, round, and reactive to light.  Neck:     Vascular: No carotid bruit.  Trachea: No tracheal deviation.     Comments: Thyroid not grossly enlarged or tender. No stiffness or rigidity.  Cardiovascular:     Rate and Rhythm: Normal rate and regular rhythm.     Pulses: Normal pulses.     Heart sounds: Normal heart sounds. No murmur heard.   No friction rub. No gallop.  Pulmonary:     Effort: Pulmonary effort is normal. No accessory muscle usage or respiratory distress.     Breath sounds: Normal breath sounds.  Abdominal:     General: Bowel sounds are normal. There is no distension.     Palpations: Abdomen is soft.     Tenderness: There is no abdominal tenderness.  Genitourinary:    Comments: No cva tenderness. Musculoskeletal:        General: No swelling.     Cervical back: Normal range of motion and  neck supple. No rigidity.  Skin:    General: Skin is warm and dry.     Findings: No rash.  Neurological:     Mental Status: He is alert.     Comments: Alert, speech clear. Motor/sens grossly intact bil.   Psychiatric:     Comments: Mildly anxious appearing. Pt minimizes behaviors outlined in his ivc. When asked if sleeps ok at night, he says 'yes', but then indicates he had to go sleep in 'different home from wife', and that he sees people looking in on his [place] at night ([place] [facility] [place] issues with anxiety, not sleeping, being up). IVC notes hallucinations and paranoid delusions. Pt at times does appear to be responding to internal stimuli. Denies thoughts of harm to self or others.     ED Results / Procedures / Treatments   Labs (all labs ordered are listed, but only abnormal results are displayed) Results for orders placed or performed during the hospital encounter of 01/11/22  Comprehensive metabolic panel  Result Value Ref Range   Sodium 144 135 - 145 mmol/L   Potassium 4.2 3.5 - 5.1 mmol/L   Chloride 112 (H) 98 - 111 mmol/L   CO2 28 22 - 32 mmol/L   Glucose, Bld 101 (H) 70 - 99 mg/dL   BUN 14 8 - 23 mg/dL   Creatinine, Ser 1.611.30 (H) 0.61 - 1.24 mg/dL   Calcium 9.6 8.9 - 09.610.3 mg/dL   Total Protein 6.8 6.5 - 8.1 g/dL   Albumin 3.9 3.5 - 5.0 g/dL   AST 15 15 - 41 U/L   ALT 10 0 - 44 U/L   Alkaline Phosphatase 60 38 - 126 U/L   Total Bilirubin 0.8 0.3 - 1.2 mg/dL   GFR, Estimated 57 (L) >60 mL/min   Anion gap 4 (L) 5 - 15  CBC  Result Value Ref Range   WBC 4.7 4.0 - 10.5 K/uL   RBC 4.41 4.22 - 5.81 MIL/uL   Hemoglobin 13.0 13.0 - 17.0 g/dL   HCT 04.538.7 (L) 40.939.0 - 81.152.0 %   MCV 87.8 80.0 - 100.0 fL   MCH 29.5 26.0 - 34.0 pg   MCHC 33.6 30.0 - 36.0 g/dL   RDW 91.412.8 78.211.5 - 95.615.5 %   Platelets 167 150 - 400 K/uL   nRBC 0.0 0.0 - 0.2 %  Ethanol  Result Value Ref Range   Alcohol, Ethyl (B) <10 <10 mg/dL    EKG None  Radiology CT Head Wo Contrast  Result  Date: 01/11/2022 CLINICAL DATA:  Altered mental status EXAM: CT HEAD WITHOUT CONTRAST TECHNIQUE: Contiguous  axial images were obtained from the base of the skull through the vertex without intravenous contrast. RADIATION DOSE REDUCTION: This exam was performed according to the departmental dose-optimization program which includes automated exposure control, adjustment of the mA and/or kV according to patient size and/or use of iterative reconstruction technique. COMPARISON:  MRI head 08/12/2019 FINDINGS: Brain: No acute intracranial hemorrhage, mass effect, or herniation. No extra-axial fluid collections. No evidence of acute territorial infarct. No hydrocephalus. Moderate cortical volume loss. Patchy hypodensities in the periventricular and subcortical white matter, likely secondary to chronic microvascular ischemic changes. Vascular: Mild calcified plaques in the carotid siphons. Skull: Normal. Negative for fracture or focal lesion. Sinuses/Orbits: No acute finding. Other: None. IMPRESSION: Chronic changes with no acute intracranial process identified. Electronically Signed   By: Jannifer Hick M.D.   On: 01/11/2022 15:09    Procedures Procedures    Medications Ordered in ED Medications - No data to display  ED Course/ Medical Decision Making/ A&P                           Medical Decision Making Problems Addressed: Anxiety: acute illness or injury with systemic symptoms that poses a threat to life or bodily functions Paranoid delusion [facility]): acute illness or injury with systemic symptoms that poses a threat to life or bodily functions  Amount and/or Complexity of Data Reviewed Independent Historian: spouse    Details: hx Labs: ordered. Decision-making details documented in ED Course. Radiology: ordered and independent interpretation performed. Decision-making details documented in ED Course.  Risk Prescription drug management. Decision regarding hospitalization.   Iv ns. Continuous  pulse ox and cardiac monitoring. Labs ordered/sent.   Diff dx includes psychosis, delusions, anxiety, etc. Disposition decision includes potential need for admission - will get [facility] consult and revisit dispo decision.   Reviewed nursing notes and prior charts for additional history. External reports reviewed. Additional history from: [employer].   Cardiac monitor: sinus rhythm, rate 74.  Labs reviewed/interpreted by me - chem normal.   CT reviewed/interpreted by me - no hem.   TTS/BH team consulted.  Pharmacy consulted re med rec. Will order home meds once complete.  The patient has been placed in psychiatric observation due to the need to provide a safe environment for the patient while obtaining psychiatric consultation and evaluation, as well as ongoing medical and medication management to treat the patient's condition.  The patient has been placed under full IVC at this time.  Disposition per [facility] team.  Signed out to oncoming EDP.        Final Clinical Impression(s) / ED Diagnoses Final diagnoses:  None    Rx / DC Orders ED Discharge Orders     None         Cathren Laine, MD 01/11/22 1520

## 2022-01-11 NOTE — ED Notes (Signed)
[name] stating he doesn't want to take anything that might affect him right now.  He states he wants to stand guard and keep watch tonight.  He also stated he was scared and didn't trust anyone.  Patient reassured that his sitter and I would keep close watch tonight and maybe he should really try to sleep.  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  I advised he had done his watch and please let us keep watch and take care of him.  He was advised that security and police are in the department and they would watching well.  He stated he would be alright and keep watch with [place].

## 2022-01-11 NOTE — ED Notes (Signed)
Patient changed into burgundy scrubs, belongings (x1 bag) placed in cabinet behind 19-22 nurse's station.

## 2022-01-11 NOTE — ED Triage Notes (Addendum)
Pt BIB [facility] with IVC paperwork. Pt denies SI/HI

## 2022-01-12 DIAGNOSIS — Z658 Other specified problems related to psychosocial circumstances: Secondary | ICD-10-CM

## 2022-01-12 DIAGNOSIS — F22 Delusional disorders: Secondary | ICD-10-CM | POA: Diagnosis not present

## 2022-01-12 DIAGNOSIS — F329 Major depressive disorder, single episode, unspecified: Secondary | ICD-10-CM | POA: Diagnosis not present

## 2022-01-12 LAB — CBG MONITORING, ED: Glucose-Capillary: 117 mg/dL — ABNORMAL HIGH (ref 70–99)

## 2022-01-12 MED ORDER — HALOPERIDOL LACTATE 5 MG/ML IJ SOLN
5.0000 mg | Freq: Once | INTRAMUSCULAR | Status: AC
  Administered 2022-01-12: 5 mg via INTRAMUSCULAR
  Filled 2022-01-12: qty 1

## 2022-01-12 MED ORDER — CLONAZEPAM 0.125 MG PO TBDP
0.5000 mg | ORAL_TABLET | Freq: Once | ORAL | Status: AC
  Administered 2022-01-12: 0.5 mg via ORAL
  Filled 2022-01-12: qty 4

## 2022-01-12 NOTE — BH Assessment (Signed)
Comprehensive Clinical Assessment (CCA) Note  01/12/2022 Carl RipaBilly Carlye Figueroa [id number]  Disposition: Nira ConnJason Berry, NP, pending IVC paperwork to determine collateral and make contact. Awaiting IVC paperwork for determination.   The patient demonstrates the following risk factors for suicide: Chronic risk factors for suicide include: N/A. Acute risk factors for suicide include: N/A. Protective factors for this patient include: positive social support, responsibility to others (children, family), and coping skills. Considering these factors, the overall suicide risk at this point appears to be moderate. Patient is not appropriate for outpatient follow up.  [facility] Row [facility]  C-SSRS RISK CATEGORY No Risk      Carl LawsBilly Figueroa is a 76 year old male presenting under IVC due to paranoia. Patient denied SI, HI, psychosis and alcohol / drug usage. When asked, why are you here, patient reported "my heart, it flutters sometimes.... I don't remember". Patient provides limited information. Patient denied prior psych hospitalizations, suicide attempts and self-harming behaviors. Patient denied receiving any outpatient mental health services. Patient denied being prescribed any psych medications. Patient denied access to guns. Patient reported feeling "sorry and lonesome". Patient was calm during assessment.   PER EDP NOTE 01/11/22: Spouse/ivc indicates patient with paranoid thoughts of people not there being out to get him, seeing people not there, wandering away from house, not sleeping at night, restless/anxious.  Pt limited historian  - states he feels fine and that his wife is the problem.  IVC requested.    Chief Complaint:  Chief Complaint  Patient presents with   IVC   Visit Diagnosis:  Major depressive disorder   CCA Screening, Triage and Referral (STR)  Patient Reported Information How did you hear about us? No data  recorded What Is the Reason for Your Visit/Call Today? IVC  How Long Has This Been Causing You Problems? 1 wk - 1 month  What Do You Feel Would Help You the Most Today? Treatment for Depression or other mood problem   Have You Recently Had Any Thoughts About Hurting Yourself? No  Are You Planning to Commit Suicide/Harm Yourself At This time? No   Have you Recently Had Thoughts About Hurting Someone [name]? No  Are You Planning to Harm Someone at This Time? No  Explanation: No data recorded  Have You Used Any Alcohol or Drugs in the Past 24 Hours? No  How Long Ago Did You Use Drugs or Alcohol? No data recorded What Did You Use and How Much? No data recorded  Do You Currently Have a Therapist/Psychiatrist? No  Name of Therapist/Psychiatrist: No data recorded  Have You Been Recently Discharged From Any Office Practice or Programs? No  Explanation of Discharge From Practice/Program: No data recorded    CCA Screening Triage Referral Assessment Type of Contact: Tele-Assessment  Telemedicine Service Delivery:   Is this Initial or Reassessment? Initial Assessment  Date Telepsych consult ordered in [facility]:  01/11/22  Time Telepsych consult ordered in [facility]:  1458  [facility]  [facility]   Collateral Involvement: none reported   Does Patient Have a Automotive engineerCourt Appointed Legal Guardian? No data recorded Name and Contact of Legal Guardian: No data recorded If Minor and Not Living with Parent(s), Who has Custody? No data recorded Is CPS involved or ever been involved? No data recorded Is APS involved or ever been involved? No data recorded  Patient Determined To Be At Risk for Harm To Self or Others  Based on Review of Patient Reported Information or Presenting Complaint? No data recorded Method: No data recorded Availability of Means: No data recorded Intent: No data recorded Notification Required: No data  recorded Additional Information for Danger to Others Potential: No data recorded Additional Comments for Danger to Others Potential: No data recorded Are There Guns or Other Weapons in Your Home? No data recorded Types of Guns/Weapons: No data recorded Are These Weapons Safely Secured?                            No data recorded Who Could Verify You Are Able To Have These Secured: No data recorded Do You Have any Outstanding Charges, Pending Court Dates, Parole/Probation? No data recorded Contacted To Inform of Risk of Harm To Self or Others: No data recorded   Does Patient Present under Involuntary Commitment? Yes  IVC Papers Initial File Date: 01/11/22   [place] of Residence: [place]   Patient Currently Receiving the Following Services: Not Receiving Services   Determination of Need: Urgent (48 hours)   Options For Referral: Medication Management; Outpatient Therapy     CCA Biopsychosocial Patient Reported Schizophrenia/Schizoaffective Diagnosis in Past: No   Strengths: uta   Mental Health Symptoms Depression:   None   Duration of Depressive symptoms:    Mania:   None   Anxiety:    None   Psychosis:   None   Duration of Psychotic symptoms:    Trauma:   None   Obsessions:   None   Compulsions:   None   Inattention:   None   Hyperactivity/Impulsivity:   None   Oppositional/Defiant Behaviors:   None   Emotional Irregularity:   None   Other Mood/Personality Symptoms:  No data recorded   Mental Status Exam Appearance and self-care  Stature:   Average   Weight:   Average weight   Clothing:   Neat/clean   Grooming:   Normal   Cosmetic use:   None   Posture/gait:   Normal   Motor activity:   Not Remarkable   Sensorium  Attention:   Normal   Concentration:   Normal   Orientation:   Situation; Place; Person; Time   Recall/memory:   Defective in Immediate; Defective in Short-term   Affect and Mood  Affect:    Appropriate   Mood:  No data recorded  Relating  Eye contact:   Normal   Facial expression:   Sad   Attitude toward examiner:   Cooperative   Thought and Language  Speech flow:  Clear and Coherent   Thought content:   Appropriate to Mood and Circumstances   Preoccupation:   None   Hallucinations:   None   Organization:  No data recorded  [employer] of Knowledge:   Average   Intelligence:   Average   Abstraction:   Normal   Judgement:   Poor   Reality Testing:  No data recorded  Insight:  No data recorded  Decision Making:  No data recorded  Social Functioning  Social Maturity:  No data recorded  Social Judgement:  No data recorded  Stress  Stressors:  No data recorded  Coping Ability:  No data recorded  Skill Deficits:  No data recorded  Supports:  No data recorded    Religion:    Leisure/Recreation:    Exercise/Diet:     CCA Employment/Education Employment/Work Situation: Employment / Work Situation Employment Situation: Retired 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) Did You Receive Any Psychiatric Treatment/Services While in the Military?:  Rich Reining)  Education: Education Is Patient Currently Attending School?: No Last Grade Completed: 16 (uta) Did You Attend College?:  (uta) Did You Have An Individualized Education Program (IIEP):  Rich Reining) Did You Have Any Difficulty At School?:  Rich Reining) Patient's Education Has Been Impacted by Current Illness:  (uta)   CCA Family/Childhood History Family and Relationship History: Family history Marital status: Married Number of Years Married: 5.5 What types of issues is patient dealing with in the relationship?: marital discord Does patient have children?: Yes How many children?: 1 How is patient's relationship with their children?: poor  Childhood History:  Childhood History By whom was/is the patient raised?:  (uta) Did patient suffer any  verbal/emotional/physical/sexual abuse as a child?: No Did patient suffer from severe childhood neglect?: No Has patient ever been sexually abused/assaulted/raped as an adolescent or adult?: No Was the patient ever a victim of a crime or a disaster?: No  Child/Adolescent Assessment:     CCA Substance Use Alcohol/Drug Use: Alcohol / Drug Use Pain Medications: see MAR Prescriptions: see MAR Over the Counter: see MAR History of alcohol / drug use?: No history of alcohol / drug abuse                         ASAM's:  Six Dimensions of Multidimensional Assessment  Dimension 1:  Acute Intoxication and/or Withdrawal Potential:      Dimension 2:  Biomedical Conditions and Complications:      Dimension 3:  Emotional, Behavioral, or Cognitive Conditions and Complications:     Dimension 4:  Readiness to Change:     Dimension 5:  Relapse, Continued use, or Continued Problem Potential:     Dimension 6:  Recovery/Living Environment:     ASAM Severity Score:    ASAM Recommended Level of Treatment:     Substance use Disorder (SUD)    Recommendations for Services/Supports/Treatments:    Discharge Disposition:    DSM5 Diagnoses: Patient Active Problem List   Diagnosis Date Noted   Chronic post-traumatic stress disorder 08/03/2021   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/2021   Impotence 08/03/2021   Primary open angle glaucoma, 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   Tremor 08/03/2021   Memory loss 08/03/2021   Gait abnormality 08/03/2021   Status post total hip replacement, 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   Demand ischemia (HCC) 07/30/2019   Anxiety 07/30/2019   Acute metabolic encephalopathy 07/08/2019   Hematuria 07/08/2019   Thrombocytopenia (HCC) 07/08/2019   Elevated troponin 07/08/2019   Sepsis (HCC) 07/07/2019   Essential hypertension 07/07/2019   Acute lower UTI 07/07/2019   ARF (acute renal failure) (HCC) 07/07/2019     Referrals to Alternative Service(s): Referred to Alternative Service(s):   Place:    Date:   Time:    Referred to Alternative Service(s):   Place:   Date:   Time:    Referred to Alternative Service(s):   Place:   Date:   Time:    Referred to Alternative Service(s):   Place:   Date:   Time:     Burnetta Sabin, [facility]

## 2022-01-12 NOTE — ED Notes (Signed)
Pt attempting to walk out, explained to pt that he is not able to leave at this time, pt adamant that he needs his clothes so he can stand watch, offered to place pt in a darker room to sleep, states he does not want to be in a dark room because he is not safe.  Explained to pt this staff is here to make sure he is safe.  Pt attempting to walk out, stating, "do not put your hands on me."  Pt placed in wheelchair and taken back to bed.  MD notified.
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
## 2022-01-12 NOTE — ED Notes (Addendum)
APS at bedside 

## 2022-01-12 NOTE — ED Notes (Addendum)
Pt ambulated to the restroom using his cane and with minimal staff assistance.

## 2022-01-12 NOTE — Progress Notes (Signed)
TOC CSW spoke with pt at bedside pt stated he did not want CSW to contact his wife. Pt stated he will be pick up by [facility] but did not know contact information. CSW received  permission to contact Trena Platt [phone]) who reported pt's friend Christena Flake is waiting in the lobby to pick pt up upon d/c.    Pt's APS worker Paris Lore [phone]) was at bedside prior to conversation. CSW provided spouse contact number. TOC sign off.   Valentina Shaggy.Twanisha Foulk, MSW, LCSWA [facility]  Transitions of Care Clinical Social Worker I Direct Dial: [phone]  Fax: [phone] [email or web address]

## 2022-01-12 NOTE — ED Provider Notes (Signed)
The patient was evaluated by the behavioral team and felt to be reasonably safe and stable for discharge.  He has a family member is here to pick him up.  At the time of discharge, the patient reportedly was witnessed to slide out of his bed onto the ground from standing.  He appeared dazed and slow to speak afterwards.  I immediately assessed him in the room, the patient is awake, reporting he has a mild headache.  He does appear to have some tremors in the bilateral hands.  I reviewed his PDMP and appears he is prescribed clonazepam 0.5 mg tablets and has been feeling this for many months.  He has not received any clonazepam in his 24 hours in the ER.  I wonder whether there may be an element of benzo withdrawal.  I do not see any clear evidence that he had a seizure.  I have asked for the clonazepam to be given in for the patient to be ambulated prior to discharge.  His blood pressure was within normal limits on reassessment.  Blood work and CT scan from yesterday otherwise reviewed and unremarkable.   Terald Sleeper, MD 01/12/22 [phone]

## 2022-01-12 NOTE — ED Notes (Signed)
Patient was given a breakfast sandwich and a sprit

## 2022-01-12 NOTE — ED Notes (Signed)
Pt awake, walks around, walked to another clean stretcher and layed down.

## 2022-01-12 NOTE — ED Notes (Signed)
Pt found seated on the ground, leaning up against linen cart. Pt states he does not remember falling. Denies hitting head. Pt alert to person. Needed to be lifted by staff into bed.

## 2022-01-12 NOTE — ED Notes (Signed)
Pt ambulated with his cane approximately 100' in the hallway unassisted. Pt HR remained in the low 90's and O2 saturation 98%.

## 2022-03-19 ENCOUNTER — Other Ambulatory Visit
Admission: RE | Admit: 2022-03-19 | Discharge: 2022-03-19 | Disposition: A | Discharge status: Home / Self Care | Payer: Medicare Other | Source: Ambulatory Visit | Attending: Infectious Diseases | Admitting: Infectious Diseases

## 2022-03-19 DIAGNOSIS — R600 Localized edema: Secondary | ICD-10-CM | POA: Insufficient documentation

## 2022-03-19 LAB — BRAIN NATRIURETIC PEPTIDE: B Natriuretic Peptide: 164.8 pg/mL — ABNORMAL HIGH (ref 0.0–100.0)

## 2022-04-19 ENCOUNTER — Ambulatory Visit: Payer: Medicare Other | Attending: Internal Medicine | Admitting: Internal Medicine

## 2022-04-19 ENCOUNTER — Encounter: Payer: Self-pay | Admitting: Internal Medicine
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
## 2022-05-09 ENCOUNTER — Encounter: Payer: Self-pay | Admitting: Medical

## 2022-05-09 ENCOUNTER — Ambulatory Visit (INDEPENDENT_AMBULATORY_CARE_PROVIDER_SITE_OTHER): Payer: Medicare Other

## 2022-05-09 ENCOUNTER — Other Ambulatory Visit
Admission: RE | Admit: 2022-05-09 | Discharge: 2022-05-09 | Disposition: A | Discharge status: Home / Self Care | Payer: Medicare Other | Source: Ambulatory Visit | Attending: Medical | Admitting: Medical

## 2022-05-09 ENCOUNTER — Ambulatory Visit: Payer: Medicare Other | Attending: Medical | Admitting: Medical

## 2022-05-09 VITALS — BP 130/78 | HR 57 | Ht 67.0 in | Wt 168.2 lb

## 2022-05-09 DIAGNOSIS — R42 Dizziness and giddiness: Secondary | ICD-10-CM | POA: Diagnosis not present

## 2022-05-09 DIAGNOSIS — Z13220 Encounter for screening for lipoid disorders: Secondary | ICD-10-CM | POA: Insufficient documentation

## 2022-05-09 DIAGNOSIS — I951 Orthostatic hypotension: Secondary | ICD-10-CM | POA: Diagnosis not present

## 2022-05-09 DIAGNOSIS — R600 Localized edema: Secondary | ICD-10-CM | POA: Insufficient documentation

## 2022-05-09 DIAGNOSIS — I5032 Chronic diastolic (congestive) heart failure: Secondary | ICD-10-CM | POA: Insufficient documentation

## 2022-05-09 LAB — BASIC METABOLIC PANEL
Anion gap: 5 (ref 5–15)
BUN: 11 mg/dL (ref 8–23)
CO2: 31 mmol/L (ref 22–32)
Calcium: 9.1 mg/dL (ref 8.9–10.3)
Chloride: 110 mmol/L (ref 98–111)
Creatinine, Ser: 1.35 mg/dL — ABNORMAL HIGH (ref 0.61–1.24)
GFR, Estimated: 54 mL/min — ABNORMAL LOW (ref >60)
Glucose, Bld: 80 mg/dL (ref 70–99)
Potassium: 3.4 mmol/L — ABNORMAL LOW (ref 3.5–5.1)
Sodium: 146 mmol/L — ABNORMAL HIGH (ref 135–145)

## 2022-05-09 MED ORDER — FUROSEMIDE 40 MG PO TABS: 40.0000 mg | ORAL_TABLET | Freq: Every day | ORAL | 3 refills | Status: DC | PRN

## 2022-05-09 NOTE — Addendum Note (Signed)
Addended by: Kavin Leech on: 05/09/2022 02:21 PM   Modules accepted: Orders

## 2022-05-09 NOTE — Patient Instructions (Signed)
.Medication Instructions:   Your physician has recommended you make the following change in your medication:    INCREASE your Furosemide (Lasix) to 40 MG once a day.  *If you need a refill on your cardiac medications before your next appointment, please call your pharmacy*   Lab Work:  Please go to the [facility] after your appointment today for a BMP lab draw.  2.   Your physician recommends that you return for lab work (BMP, Fasting Lipid) in: 2 weeks (05/23/22)  - You will need to be fasting. Please do not have anything to eat or drink after midnight the morning you have the lab work. You may only have water or black coffee with no cream or sugar.   - Please go to the [facility]. You will check in at the front desk to the right as you walk into the atrium. Valet Parking is offered if needed. - No appointment needed. You may go any day between 7 am and 6 pm.   Testing/Procedures:  Your physician has recommended that you wear a Zio XT monitor for 2 weeks. This will be mailed to your home address in 4-5 business days.   Your clinician has requested a Zio heart rhythm monitor by [employer] to be mailed to your home for you to wear for 14 days. You should expect a small box to arrive via USPS (or FedEx in some cases) within this next week. If you do not receive it please call [employer] at [phone].  Closely watching your heart at this time will help your care team understand more and provide information needed to develop your plan of care.  Please apply your Zio patch monitor the day you receive it. Keep this packaging, you will use this to return your Zio monitor.  You will easily be able to apply the monitor with the instructions provided in the Patient Guide.  If you need assistance, [employer] representatives are available 24/7 at [phone].  You can also download the [facility] app on your phone to view detailed application instructions and log symptoms.  After you wear  your monitor for 14 days, place it back in the blue box or envelope, along with your Symptom Log.  To send your monitor back: Simply use the pre-addressed and pre-paid box/envelope.  Send it back through [employer] the same day you remove it via your local post office or by placing it in your mailbox.  As soon as we receive the results, they will be reviewed and your clinician will contact you.  For the first 24 hours- it is essential to not shower or exercise, to allow the patch to adhere to your skin. Avoid excessive sweating to help maximize wear time. Do not submerge the device, no hot tubs, and no swimming pools. Keep any lotions or oils away from the patch. After 24 hours you may shower with the patch on. Take brief showers with your back facing the shower head.  Do not remove patch once it has been placed because that will interrupt data and decrease adhesive wear time. Push the button when you have any symptoms and write down what you were feeling. Once you have completed wearing your monitor, remove and place into box which has postage paid and place in your outgoing mailbox.  If for some reason you have misplaced your box then call our office and we can provide another box and/or mail it off for you.    Follow-Up: At [facility], you and your health needs are our priority.  As part of our continuing mission to provide you with exceptional heart care, we have created designated Provider Care Teams.  These Care Teams include your primary Cardiologist (physician) and Advanced Practice Providers (APPs -  Physician Assistants and Nurse Practitioners) who all work together to provide you with the care you need, when you need it.  We recommend signing up for the patient portal called "MyChart".  Sign up information is provided on this After Visit Summary.  MyChart is used to connect with patients for Virtual Visits (Telemedicine).  Patients are able to view lab/test results,  encounter notes, upcoming appointments, etc.  Non-urgent messages can be sent to your provider as well.   To learn more about what you can do with MyChart, go to [email or web address].    Your next appointment:   4-6 week(s)  The format for your next appointment:   In Person  Provider:   You may see Nelva Bush, MD or one of the following Advanced Practice Providers on your designated Care Team:   Murray Hodgkins, NP Christell Faith, PA-C Cadence Kathlen Mody, PA-C Gerrie Nordmann, NP    Other Instructions   Important Information About Sugar

## 2022-05-09 NOTE — Progress Notes (Signed)
Cardiology Office Note:    Date:  05/09/2022   ID:  Carl Figueroa, Carl Figueroa 15-Jan-1946, MRN [id number]  PCP:  Chaney Malling  [facility] Cardiologist:  Nelva Bush, MD  [place] Electrophysiologist:  None   Referring MD: Phineas Inches, PA-C   Chief Complaint: lower leg edema  History of Present Illness:    Carl Figueroa is a 76 y.o. 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   He was admitted 06/2019 for urosepsis and troponins were noted to be elevated. He denies anginal symptoms. Myoview Lexican showed no ischemia or scar. Primary prevention was recommended.   Last seen 08/2019 and was overall doing well from a cardiac standpoint.    Today, the patient is accompanied by his wife who reports he has Lewy body dementia. She also reports he has not been taking his cardiac medications. He has been taking lisinopril 40mg  dialy, but not Lipitor. The patient has lower leg edema, which has been present for about 2 weeks. He takes lasix 20mg  daily. [facility] at the [place]. He denies chest pain or SOB. Wife reports labile Bps at home with systolics up to 354S at times. Today BP is good. Patient reports constant dizziness, worse with changing positions.   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 (Patient taking differently: Take 0.25 mg by mouth at bedtime.)   lisinopril (ZESTRIL) 40 MG tablet Take 20 mg by mouth every morning.    [DISCONTINUED] furosemide (LASIX) 20 MG tablet Take 20 mg by mouth daily as needed. As needed for leg edema     Allergies:   Morphine   Social History   Socioeconomic History   Marital status: Married    Spouse name: Not on file   Number of children: Not on file   Years of education: Not on file   Highest education level: Not on file  Occupational History   Not on file  Tobacco Use   Smoking status: Former    Packs/day: 0.25    Years: 20.00    Total pack years: 5.00    Types: Cigarettes    Quit date: 10/28/2019    Years since quitting: 2.5   Smokeless tobacco: Never  Vaping Use   Vaping Use: Never used  Substance and Sexual Activity   Alcohol use: No   Drug use: No   Sexual activity: Not on file  Other Topics Concern   Not on file  Social History Narrative   Not on file   Social Determinants of Health   Financial Resource Strain: Not on file  Food Insecurity: Not on file  Transportation Needs: Not on file  Physical Activity: Not on file  Stress: Not on file  Social Connections: Not on file     Family History: The patient's family history includes Cancer in his mother. There is no history of Diabetes Mellitus II.  ROS:   Please see the history of present illness.     All other systems reviewed and are negative.  EKGs/Labs/Other Studies Reviewed:    The following studies were reviewed today:  Echo 06/2019  1. Left ventricular ejection fraction, by visual estimation, is 60 to  65%. The left ventricle has normal function. There is mildly increased  left ventricular hypertrophy.   2. Elevated left atrial pressure.   3. Left ventricular diastolic parameters are consistent with Grade I  diastolic dysfunction (impaired relaxation).   4. The left ventricle has no regional wall motion abnormalities.   5. Global right  ventricle has low normal systolic function.The right  ventricular size is normal. Right vetricular wall thickness was not  assessed.   6. Left atrial size was normal.   7. Right atrial size was normal.   8. The mitral valve is grossly normal. Trace mitral valve regurgitation.  No evidence of mitral stenosis.   9. The tricuspid valve is not well visualized. Tricuspid valve  regurgitation is not demonstrated.  10. The aortic valve was not well visualized. Aortic valve regurgitation  is not visualized. Mild aortic valve sclerosis without stenosis.  11. The pulmonic valve was not well visualized. Pulmonic valve  regurgitation is not visualized.  12. TR signal is inadequate for assessing pulmonary artery systolic  pressure.  13. The interatrial septum was not well visualized.   EKG:  EKG is ordered today.  The ekg ordered today demonstrates SB 57bpm, nonspecific T wave changes  Recent Labs: 08/03/2021: TSH 1.560 01/11/2022: ALT 10; BUN 14; Creatinine, Ser 1.30; Hemoglobin 13.0; Platelets 167; Potassium 4.2; Sodium 144 03/19/2022: B Natriuretic Peptide 164.8  Recent Lipid Panel    Component Value Date/Time   CHOL 149 07/08/2019 0513   TRIG 80 07/08/2019 0513   HDL 47 07/08/2019 0513   CHOLHDL 3.2 07/08/2019 0513   VLDL 16 07/08/2019 0513   LDLCALC 86 07/08/2019 0513    Physical Exam:    VS:  BP 130/78 (BP Location: Left Arm, Patient Position: Sitting, Cuff Size: Normal)   Pulse (!) 57   Ht 5\' 7"  (1.702 m)   Wt 168 lb 3.2 oz (76.3 kg)   SpO2 98%   BMI 26.34 kg/m     Wt Readings from Last 3 Encounters:  05/09/22 168 lb 3.2 oz (76.3 kg)  01/11/22 175 lb (79.4 kg)  11/15/21 167 lb (75.8 kg)     GEN:  Well nourished, well developed in no acute distress HEENT: Normal NECK: No JVD; No carotid bruits LYMPHATICS: No lymphadenopathy CARDIAC: RRR, no murmurs, rubs, gallops RESPIRATORY:  Clear to auscultation without rales, wheezing or rhonchi  ABDOMEN: Soft, non-tender,  non-distended MUSCULOSKELETAL:  1-2+ lower leg edema; No deformity  SKIN: Warm and dry NEUROLOGIC:  Alert and oriented x 3 PSYCHIATRIC:  Normal affect   ASSESSMENT:    1. Dizziness   2. Lower leg edema   3. Chronic diastolic heart failure (HCC)   4. Orthostasis    PLAN:    In order of problems listed above:  Lower leg edema Chronic diastolic dysfunction [facility] He was referred for lower leg edema, reportedly worse over the last two weeks. He denies SOB, orthopnea or pnd. He takes lasix 20mg  daily. The wife reported an  echocardiogram was done at the [facility], however do not see in our records, we will try and request it. I will increase lasix to 40mg  daily. I will check a BMET today and in 2 weeks.   Dizziness/Orthostasis He reports constant dizziness, worse with orthostasis. I will check a 2 week heart monitor. Orthostatics show a drop in BP but not considered positive. Recommend good hydration and caution with position change.   HLD Patient was previously on Lipitor. I will update a fasting lipid panel.   Disposition: Follow up in 4-6 week(s) with MD/APP     Signed, Hodge Stachnik , PA-C  05/09/2022 12:10 PM    [facility]

## 2022-05-14 DIAGNOSIS — R42 Dizziness and giddiness: Secondary | ICD-10-CM | POA: Diagnosis not present

## 2022-05-15 ENCOUNTER — Telehealth: Payer: Self-pay

## 2022-05-15 DIAGNOSIS — E876 Hypokalemia: Secondary | ICD-10-CM

## 2022-05-15 NOTE — Telephone Encounter (Signed)
Called back to speak with the pt wife as requested. Lmtcb.

## 2022-05-15 NOTE — Telephone Encounter (Signed)
-----   Message from [place], PA-C sent at 05/15/2022  9:59 AM EDT ----- Labs overall stable. Patient should be getting repeat labs in 1-2 weeks. Also can we start klor-con 22mEQ daily

## 2022-05-15 NOTE — Telephone Encounter (Addendum)
Called the patient to review lab results and Cadence Furth, [place] recommendation. Patient asked if I could call back in 15-20 mins to talk with his wife.

## 2022-05-18 MED ORDER — POTASSIUM CHLORIDE ER 10 MEQ PO TBCR: 10.0000 meq | EXTENDED_RELEASE_TABLET | Freq: Every day | ORAL | 2 refills | Status: DC

## 2022-05-18 NOTE — Telephone Encounter (Signed)
Pt wife made aware of lab results and Cadence Furth, [place] recommendation with verbalized understanding. Rx for potassium 10 meq daily sent to the pt pharmacy. Pt will have a repeat bmp in 1-2 wks [facility].

## 2022-05-20 HISTORY — PX: OTHER SURGICAL HISTORY: SHX169

## 2022-05-21 ENCOUNTER — Ambulatory Visit: Payer: Medicare Other | Admitting: Adult Health

## 2022-05-29 ENCOUNTER — Ambulatory Visit: Payer: Medicare Other | Admitting: Neurology

## 2022-06-01 ENCOUNTER — Emergency Department
Admission: EM | Admit: 2022-06-01 | Discharge: 2022-06-02 | Discharge status: Against Medical Advice | Payer: Medicare Other

## 2022-06-06 ENCOUNTER — Telehealth: Payer: Self-pay | Admitting: Internal Medicine

## 2022-06-06 NOTE — Telephone Encounter (Signed)
Wife called stating patient is currently in [facility] ICU. Wife stated patient wore a monitor and she would like to get the results of his monitor test.  Wife also noted the patient also had COVID during the monitor testing period.

## 2022-06-06 NOTE — Telephone Encounter (Signed)
I called and spoke with Mrs. Bobbye Charleston. She advised the patient had been having belly pain for ~ 1 week, but his communication is not very good and so she was having a hard time interpreting what his level of pain was.   She took him to the [facility] on Saturday 06/02/22. Per her report: - he had a "kink" in his intestines - went to have a BM and release an "air bubble" to his heart - his heart stopped and he had to be resuscitated - he was put on life support - he has undergone 3 surgeries after his belly swelled up to the size of a basketball - his kidney function and been up and down and he has required dialysis - he is still on "life support" but able to communicate with his eyes - currently stable per Mrs. Bobbye Charleston  Mrs. Jump also wanted to report that she and the patient both had COVID (dx on 05/19/22) while the patient was wearing his heart monitor.  I advised Mrs. Bachtel that the patient's monitor has not resulted back to [place].  However, according to the [employer], processing for the monitor just started today.  She is aware that I will forward an update to Dr. Saunders Revel Cadence, PA of the above, but that we will also call with the results of the monitor once received.   Mrs. Rockers voices understanding of the above and is agreeable.

## 2022-06-20 ENCOUNTER — Ambulatory Visit: Payer: Medicare Other | Admitting: Medical

## 2022-07-07 ENCOUNTER — Emergency Department: Payer: Medicare Other

## 2022-07-07 ENCOUNTER — Inpatient Hospital Stay
Admission: EM | Admit: 2022-07-07 | Discharge: 2022-07-17 | DRG: 871 | Disposition: A | Discharge status: Home Health | Payer: Medicare Other | Attending: Internal Medicine | Admitting: Internal Medicine

## 2022-07-07 ENCOUNTER — Other Ambulatory Visit: Payer: Self-pay

## 2022-07-07 DIAGNOSIS — F0284 Dementia in other diseases classified elsewhere, unspecified severity, with anxiety: Secondary | ICD-10-CM | POA: Diagnosis present

## 2022-07-07 DIAGNOSIS — Z978 Presence of other specified devices: Secondary | ICD-10-CM

## 2022-07-07 DIAGNOSIS — R338 Other retention of urine: Secondary | ICD-10-CM | POA: Diagnosis present

## 2022-07-07 DIAGNOSIS — Z9889 Other specified postprocedural states: Secondary | ICD-10-CM

## 2022-07-07 DIAGNOSIS — Z8719 Personal history of other diseases of the digestive system: Secondary | ICD-10-CM

## 2022-07-07 DIAGNOSIS — R34 Anuria and oliguria: Secondary | ICD-10-CM | POA: Diagnosis present

## 2022-07-07 DIAGNOSIS — G3183 Dementia with Lewy bodies: Secondary | ICD-10-CM | POA: Diagnosis present

## 2022-07-07 DIAGNOSIS — Z79899 Other long term (current) drug therapy: Secondary | ICD-10-CM

## 2022-07-07 DIAGNOSIS — R404 Transient alteration of awareness: Secondary | ICD-10-CM | POA: Diagnosis not present

## 2022-07-07 DIAGNOSIS — Z792 Long term (current) use of antibiotics: Secondary | ICD-10-CM | POA: Diagnosis not present

## 2022-07-07 DIAGNOSIS — E870 Hyperosmolality and hypernatremia: Secondary | ICD-10-CM | POA: Diagnosis not present

## 2022-07-07 DIAGNOSIS — D649 Anemia, unspecified: Secondary | ICD-10-CM | POA: Diagnosis present

## 2022-07-07 DIAGNOSIS — I2510 Atherosclerotic heart disease of native coronary artery without angina pectoris: Secondary | ICD-10-CM | POA: Diagnosis present

## 2022-07-07 DIAGNOSIS — J690 Pneumonitis due to inhalation of food and vomit: Secondary | ICD-10-CM | POA: Diagnosis present

## 2022-07-07 DIAGNOSIS — G9389 Other specified disorders of brain: Secondary | ICD-10-CM | POA: Diagnosis present

## 2022-07-07 DIAGNOSIS — R54 Age-related physical debility: Secondary | ICD-10-CM | POA: Diagnosis present

## 2022-07-07 DIAGNOSIS — Z932 Ileostomy status: Secondary | ICD-10-CM

## 2022-07-07 DIAGNOSIS — G9341 Metabolic encephalopathy: Secondary | ICD-10-CM | POA: Diagnosis present

## 2022-07-07 DIAGNOSIS — F05 Delirium due to known physiological condition: Secondary | ICD-10-CM | POA: Diagnosis present

## 2022-07-07 DIAGNOSIS — J189 Pneumonia, unspecified organism: Secondary | ICD-10-CM

## 2022-07-07 DIAGNOSIS — R0902 Hypoxemia: Secondary | ICD-10-CM | POA: Diagnosis not present

## 2022-07-07 DIAGNOSIS — Z933 Colostomy status: Secondary | ICD-10-CM

## 2022-07-07 DIAGNOSIS — Z789 Other specified health status: Secondary | ICD-10-CM | POA: Diagnosis not present

## 2022-07-07 DIAGNOSIS — E872 Acidosis, unspecified: Secondary | ICD-10-CM | POA: Diagnosis present

## 2022-07-07 DIAGNOSIS — E875 Hyperkalemia: Secondary | ICD-10-CM

## 2022-07-07 DIAGNOSIS — I10 Essential (primary) hypertension: Secondary | ICD-10-CM | POA: Diagnosis present

## 2022-07-07 DIAGNOSIS — N179 Acute kidney failure, unspecified: Secondary | ICD-10-CM | POA: Diagnosis present

## 2022-07-07 DIAGNOSIS — I38 Endocarditis, valve unspecified: Secondary | ICD-10-CM | POA: Diagnosis present

## 2022-07-07 DIAGNOSIS — A4189 Other specified sepsis: Secondary | ICD-10-CM

## 2022-07-07 DIAGNOSIS — L8990 Pressure ulcer of unspecified site, unspecified stage: Secondary | ICD-10-CM | POA: Insufficient documentation

## 2022-07-07 DIAGNOSIS — I959 Hypotension, unspecified: Secondary | ICD-10-CM

## 2022-07-07 DIAGNOSIS — I76 Septic arterial embolism: Secondary | ICD-10-CM | POA: Diagnosis not present

## 2022-07-07 DIAGNOSIS — R4182 Altered mental status, unspecified: Principal | ICD-10-CM

## 2022-07-07 DIAGNOSIS — A419 Sepsis, unspecified organism: Secondary | ICD-10-CM | POA: Diagnosis not present

## 2022-07-07 DIAGNOSIS — R413 Other amnesia: Secondary | ICD-10-CM | POA: Diagnosis present

## 2022-07-07 DIAGNOSIS — Y95 Nosocomial condition: Secondary | ICD-10-CM | POA: Diagnosis present

## 2022-07-07 DIAGNOSIS — I9589 Other hypotension: Secondary | ICD-10-CM | POA: Diagnosis not present

## 2022-07-07 DIAGNOSIS — F02811 Dementia in other diseases classified elsewhere, unspecified severity, with agitation: Secondary | ICD-10-CM | POA: Diagnosis present

## 2022-07-07 DIAGNOSIS — F0390 Unspecified dementia without behavioral disturbance: Secondary | ICD-10-CM | POA: Insufficient documentation

## 2022-07-07 DIAGNOSIS — Z23 Encounter for immunization: Secondary | ICD-10-CM

## 2022-07-07 DIAGNOSIS — R6520 Severe sepsis without septic shock: Secondary | ICD-10-CM | POA: Diagnosis present

## 2022-07-07 DIAGNOSIS — I96 Gangrene, not elsewhere classified: Secondary | ICD-10-CM | POA: Diagnosis present

## 2022-07-07 DIAGNOSIS — R1310 Dysphagia, unspecified: Secondary | ICD-10-CM | POA: Diagnosis present

## 2022-07-07 DIAGNOSIS — Z96642 Presence of left artificial hip joint: Secondary | ICD-10-CM | POA: Diagnosis present

## 2022-07-07 DIAGNOSIS — N1831 Chronic kidney disease, stage 3a: Secondary | ICD-10-CM | POA: Diagnosis present

## 2022-07-07 DIAGNOSIS — I129 Hypertensive chronic kidney disease with stage 1 through stage 4 chronic kidney disease, or unspecified chronic kidney disease: Secondary | ICD-10-CM | POA: Diagnosis present

## 2022-07-07 DIAGNOSIS — Z885 Allergy status to narcotic agent status: Secondary | ICD-10-CM

## 2022-07-07 DIAGNOSIS — Z515 Encounter for palliative care: Secondary | ICD-10-CM

## 2022-07-07 DIAGNOSIS — M1990 Unspecified osteoarthritis, unspecified site: Secondary | ICD-10-CM | POA: Diagnosis present

## 2022-07-07 DIAGNOSIS — L89152 Pressure ulcer of sacral region, stage 2: Secondary | ICD-10-CM | POA: Diagnosis present

## 2022-07-07 DIAGNOSIS — Z9049 Acquired absence of other specified parts of digestive tract: Secondary | ICD-10-CM

## 2022-07-07 DIAGNOSIS — Z87891 Personal history of nicotine dependence: Secondary | ICD-10-CM

## 2022-07-07 DIAGNOSIS — Z809 Family history of malignant neoplasm, unspecified: Secondary | ICD-10-CM

## 2022-07-07 DIAGNOSIS — N401 Enlarged prostate with lower urinary tract symptoms: Secondary | ICD-10-CM | POA: Diagnosis present

## 2022-07-07 HISTORY — DX: Other specified postprocedural states: Z98.890

## 2022-07-07 LAB — CBC WITH DIFFERENTIAL/PLATELET
Abs Immature Granulocytes: 0.06 10*3/uL (ref 0.00–0.07)
Basophils Absolute: 0 10*3/uL (ref 0.0–0.1)
Basophils Relative: 0 %
Eosinophils Absolute: 0 10*3/uL (ref 0.0–0.5)
Eosinophils Relative: 0 %
HCT: 29.3 % — ABNORMAL LOW (ref 39.0–52.0)
Hemoglobin: 9.4 g/dL — ABNORMAL LOW (ref 13.0–17.0)
Immature Granulocytes: 0 %
Lymphocytes Relative: 6 %
Lymphs Abs: 0.9 10*3/uL (ref 0.7–4.0)
MCH: 27.8 pg (ref 26.0–34.0)
MCHC: 32.1 g/dL (ref 30.0–36.0)
MCV: 86.7 fL (ref 80.0–100.0)
Monocytes Absolute: 1.1 10*3/uL — ABNORMAL HIGH (ref 0.1–1.0)
Monocytes Relative: 7 %
Neutro Abs: 13.4 10*3/uL — ABNORMAL HIGH (ref 1.7–7.7)
Neutrophils Relative %: 87 %
Platelets: 403 10*3/uL — ABNORMAL HIGH (ref 150–400)
RBC: 3.38 MIL/uL — ABNORMAL LOW (ref 4.22–5.81)
RDW: 14.9 % (ref 11.5–15.5)
WBC: 15.4 10*3/uL — ABNORMAL HIGH (ref 4.0–10.5)
nRBC: 0 % (ref 0.0–0.2)

## 2022-07-07 LAB — GASTROINTESTINAL PANEL BY PCR, STOOL (REPLACES STOOL CULTURE)

## 2022-07-07 LAB — COMPREHENSIVE METABOLIC PANEL
ALT: 20 U/L (ref 0–44)
AST: 35 U/L (ref 15–41)
Albumin: 2.8 g/dL — ABNORMAL LOW (ref 3.5–5.0)
Alkaline Phosphatase: 127 U/L — ABNORMAL HIGH (ref 38–126)
Anion gap: 10 (ref 5–15)
BUN: 24 mg/dL — ABNORMAL HIGH (ref 8–23)
CO2: 22 mmol/L (ref 22–32)
Calcium: 10.7 mg/dL — ABNORMAL HIGH (ref 8.9–10.3)
Chloride: 111 mmol/L (ref 98–111)
Creatinine, Ser: 1.79 mg/dL — ABNORMAL HIGH (ref 0.61–1.24)
GFR, Estimated: 39 mL/min — ABNORMAL LOW (ref >60)
Glucose, Bld: 143 mg/dL — ABNORMAL HIGH (ref 70–99)
Potassium: 5.8 mmol/L — ABNORMAL HIGH (ref 3.5–5.1)
Sodium: 143 mmol/L (ref 135–145)
Total Bilirubin: 0.8 mg/dL (ref 0.3–1.2)
Total Protein: 8.7 g/dL — ABNORMAL HIGH (ref 6.5–8.1)

## 2022-07-07 LAB — URINALYSIS, ROUTINE W REFLEX MICROSCOPIC
Bilirubin Urine: NEGATIVE
Glucose, UA: NEGATIVE mg/dL
Ketones, ur: NEGATIVE mg/dL
Nitrite: NEGATIVE
Protein, ur: 100 mg/dL — AB
Specific Gravity, Urine: 1.017 (ref 1.005–1.030)
Squamous Epithelial / HPF: NONE SEEN (ref 0–5)
pH: 5 (ref 5.0–8.0)

## 2022-07-07 LAB — TROPONIN I (HIGH SENSITIVITY)
Troponin I (High Sensitivity): 57 ng/L — ABNORMAL HIGH (ref <18)
Troponin I (High Sensitivity): 64 ng/L — ABNORMAL HIGH (ref <18)

## 2022-07-07 LAB — BLOOD GAS, VENOUS
Acid-base deficit: 1.9 mmol/L (ref 0.0–2.0)
Bicarbonate: 25.5 mmol/L (ref 20.0–28.0)
O2 Saturation: 30.5 %
Patient temperature: 37
pCO2, Ven: 53 mmHg (ref 44–60)
pH, Ven: 7.29 (ref 7.25–7.43)
pO2, Ven: <31 mmHg — CL (ref 32–45)

## 2022-07-07 LAB — C DIFFICILE QUICK SCREEN W PCR REFLEX
C Diff antigen: NEGATIVE
C Diff interpretation: NOT DETECTED
C Diff toxin: NEGATIVE

## 2022-07-07 LAB — LACTIC ACID, PLASMA
Lactic Acid, Venous: 4.3 mmol/L (ref 0.5–1.9)
Lactic Acid, Venous: 3.5 mmol/L (ref 0.5–1.9)

## 2022-07-07 MED ORDER — ORAL CARE MOUTH RINSE: 15.0000 mL | OROMUCOSAL | Status: DC | PRN

## 2022-07-07 MED ORDER — STROKE: EARLY STAGES OF RECOVERY BOOK: Freq: Once | Status: DC

## 2022-07-07 MED ORDER — SODIUM CHLORIDE 0.9 % IV SOLN: 75.0000 mL/h | INTRAVENOUS | Status: DC

## 2022-07-07 MED ORDER — LACTATED RINGERS IV SOLN: INTRAVENOUS | Status: AC

## 2022-07-07 MED ORDER — LORAZEPAM 2 MG/ML IJ SOLN: 2.0000 mg | INTRAMUSCULAR | Status: DC | PRN

## 2022-07-07 MED ORDER — LACTATED RINGERS IV BOLUS (SEPSIS)
250.0000 mL | Freq: Once | INTRAVENOUS | Status: AC
  Administered 2022-07-07: 250 mL via INTRAVENOUS

## 2022-07-07 MED ORDER — ENOXAPARIN SODIUM 40 MG/0.4ML IJ SOSY
40.0000 mg | PREFILLED_SYRINGE | INTRAMUSCULAR | Status: DC
  Administered 2022-07-08 – 2022-07-17 (×10): 40 mg via SUBCUTANEOUS
  Filled 2022-07-07 (×10): qty 0.4

## 2022-07-07 MED ORDER — ONDANSETRON HCL 4 MG PO TABS: 4.0000 mg | ORAL_TABLET | Freq: Four times a day (QID) | ORAL | Status: DC | PRN

## 2022-07-07 MED ORDER — VANCOMYCIN HCL 750 MG/150ML IV SOLN
750.0000 mg | INTRAVENOUS | Status: DC
  Administered 2022-07-08: 750 mg via INTRAVENOUS
  Filled 2022-07-07 (×2): qty 150

## 2022-07-07 MED ORDER — LACTATED RINGERS IV BOLUS (SEPSIS)
1000.0000 mL | Freq: Once | INTRAVENOUS | Status: AC
  Administered 2022-07-07: 1000 mL via INTRAVENOUS

## 2022-07-07 MED ORDER — ACETAMINOPHEN 650 MG RE SUPP: 650.0000 mg | RECTAL | Status: DC | PRN

## 2022-07-07 MED ORDER — ACETAMINOPHEN 325 MG RE SUPP: 650.0000 mg | Freq: Four times a day (QID) | RECTAL | Status: DC | PRN

## 2022-07-07 MED ORDER — IOHEXOL 300 MG/ML  SOLN
80.0000 mL | Freq: Once | INTRAMUSCULAR | Status: AC | PRN
  Administered 2022-07-07: 80 mL via INTRAVENOUS

## 2022-07-07 MED ORDER — SODIUM CHLORIDE 0.9 % IV SOLN
3.0000 g | Freq: Three times a day (TID) | INTRAVENOUS | Status: DC
  Administered 2022-07-08 (×2): 3 g via INTRAVENOUS
  Filled 2022-07-07 (×2): qty 8

## 2022-07-07 MED ORDER — ACETAMINOPHEN 325 MG PO TABS: 650.0000 mg | ORAL_TABLET | Freq: Four times a day (QID) | ORAL | Status: DC | PRN

## 2022-07-07 MED ORDER — ACETAMINOPHEN 325 MG PO TABS
650.0000 mg | ORAL_TABLET | ORAL | Status: DC | PRN
  Administered 2022-07-10 – 2022-07-17 (×6): 650 mg via ORAL
  Filled 2022-07-07 (×6): qty 2

## 2022-07-07 MED ORDER — ONDANSETRON HCL 4 MG/2ML IJ SOLN: 4.0000 mg | Freq: Four times a day (QID) | INTRAMUSCULAR | Status: DC | PRN

## 2022-07-07 MED ORDER — VANCOMYCIN HCL 1250 MG/250ML IV SOLN
1250.0000 mg | Freq: Once | INTRAVENOUS | Status: AC
  Administered 2022-07-07: 1250 mg via INTRAVENOUS
  Filled 2022-07-07: qty 250

## 2022-07-07 MED ORDER — METRONIDAZOLE 500 MG/100ML IV SOLN
500.0000 mg | Freq: Once | INTRAVENOUS | Status: AC
  Administered 2022-07-07: 500 mg via INTRAVENOUS
  Filled 2022-07-07: qty 100

## 2022-07-07 MED ORDER — ORAL CARE MOUTH RINSE
15.0000 mL | OROMUCOSAL | Status: DC
  Administered 2022-07-08 – 2022-07-17 (×69): 15 mL via OROMUCOSAL
  Filled 2022-07-07 (×11): qty 15

## 2022-07-07 MED ORDER — SODIUM CHLORIDE 0.9 % IV SOLN
2.0000 g | Freq: Once | INTRAVENOUS | Status: AC
  Administered 2022-07-07: 2 g via INTRAVENOUS
  Filled 2022-07-07: qty 12.5

## 2022-07-07 MED ORDER — VANCOMYCIN HCL IN DEXTROSE 1-5 GM/200ML-% IV SOLN: 1000.0000 mg | Freq: Once | INTRAVENOUS | Status: DC

## 2022-07-07 NOTE — Assessment & Plan Note (Signed)
Hyperkalemia protocol Monitor and correct as needed

## 2022-07-07 NOTE — Assessment & Plan Note (Signed)
CT showing distended bladder in spite of Foley We will attempt repositioning Foley UA minimally abnormal so UTI not suspected at this time 

## 2022-07-07 NOTE — ED Triage Notes (Signed)
"  He came home from the [facility] hospital yesterday after being in the hospital one month and three days. He had episode of freezing up and not responding last night and then another one today so I called EMS because I thought it could be a seizure from the medication" per spouse.

## 2022-07-07 NOTE — Consult Note (Signed)
PHARMACY -  BRIEF ANTIBIOTIC NOTE   Pharmacy has received consult(s) for vancomycin and cefepime from an ED provider.  The patient's profile has been reviewed for ht/wt/allergies/indication/available labs.    One time order(s) placed for vancomycin 1250 mg and cefepime 2 g   Further antibiotics/pharmacy consults should be ordered by admitting physician if indicated.                       Thank you, Derrek Gu, PharmD 07/07/2022  6:32 PM

## 2022-07-07 NOTE — Assessment & Plan Note (Signed)
Delirium precautions 

## 2022-07-07 NOTE — ED Notes (Signed)
Foley irrigated per MD's request. Foley flowing appropriately

## 2022-07-07 NOTE — Assessment & Plan Note (Signed)
Secondary to sepsis IV hydration and monitor renal function and avoid nephrotoxins

## 2022-07-07 NOTE — Assessment & Plan Note (Signed)
Secondary to sepsis.  Holding all antihypertensives Continue IV sepsis fluid resuscitation

## 2022-07-07 NOTE — Progress Notes (Signed)
Pharmacy Antibiotic Note  Christipher Rieger is a 76 y.o. male admitted on 07/07/2022 with aspiration pneumonia.  Pharmacy has been consulted for Unasyn & Vancomycin dosing for x 7 days.  Plan: Unasyn 3 gm q8hr per indication & renal fxn.  Pt given Vancomycin 1250 mg once. Vancomycin 750 mg IV Q 24 hrs. Goal AUC 400-550. Expected AUC: 484.1 SCr used: 1.79  Pharmacy will continue to follow and will adjust abx dosing whenever warranted.  Temp (24hrs), Avg:97.6 F (36.4 C), Min:97.6 F (36.4 C), Max:97.6 F (36.4 C)   Recent Labs  Lab 07/07/22 1633 07/07/22 1845  WBC 15.4*  --   CREATININE 1.79*  --   LATICACIDVEN 4.3* 3.5*    Estimated Creatinine Clearance: 32.8 mL/min (A) (by C-G formula based on SCr of 1.79 mg/dL (H)).    Allergies  Allergen Reactions   Morphine Nausea Only    Antimicrobials this admission: 11/18 Cefepime >> x 1 dose 11/18 Vancomycin >> x 7 days 11/19 Unasyn >> x 7 days  Microbiology results: 11/18 BCx: Pending  Thank you for allowing pharmacy to be a part of this patient's care.  Otelia Sergeant, PharmD, [facility] 07/07/2022 10:43 PM

## 2022-07-07 NOTE — ED Provider Notes (Signed)
[facility] Provider Note    None    (approximate)   History   Altered Mental Status   HPI  Carl Figueroa is a 76 y.o. male with an episode of staring twice today.  He was in the hospital recently with an appendectomy and some abscesses he has had a large vertical abdominal incision with colostomy he has a Foley.  He has some breakdown on his buttocks is tachypneic and tachycardic.  He has a history of dementia.  Patient himself says he feels okay.  Nothing is bothering him.      Physical Exam   Triage Vital Signs: ED Triage Vitals  Enc Vitals Group     BP      Pulse      Resp      Temp      Temp src      SpO2      Weight      Height      Head Circumference      Peak Flow      Pain Score      Pain Loc      Pain Edu?      Excl. in GC?     Most recent vital signs: Vitals:   07/07/22 2053 07/07/22 2135  BP: (!) 118/58 (!) 83/71  Pulse:    Resp: 14 17  Temp:  97.6 F (36.4 C)  SpO2:  100%     General: Awake, no distress.  CV:  Good peripheral perfusion.  Heart regular rate and rhythm no audible murmurs Resp:  Normal effort.  Lungs are clear Abd:  Abdomen is scaphoid patient has a large healing vertical incision he has a right-sided colostomy with what appears to be diarrheal stool in the bag Extremities no edema Buttocks there is a decubitus that is down through the skin into the subcu tissue about 4 x 6 cm irregularly shaped.  Does not appear to be infected currently.  ED Results / Procedures / Treatments   Labs (all labs ordered are listed, but only abnormal results are displayed) Labs Reviewed  COMPREHENSIVE METABOLIC PANEL - Abnormal; Notable for the following components:      Result Value   Potassium 5.8 (*)    Glucose, Bld 143 (*)    BUN 24 (*)    Creatinine, Ser 1.79 (*)    Calcium 10.7 (*)    Total Protein 8.7 (*)    Albumin 2.8 (*)    Alkaline Phosphatase 127 (*)    GFR, Estimated 39 (*)    All other  components within normal limits  CBC WITH DIFFERENTIAL/PLATELET - Abnormal; Notable for the following components:   WBC 15.4 (*)    RBC 3.38 (*)    Hemoglobin 9.4 (*)    HCT 29.3 (*)    Platelets 403 (*)    Neutro Abs 13.4 (*)    Monocytes Absolute 1.1 (*)    All other components within normal limits  URINALYSIS, ROUTINE W REFLEX MICROSCOPIC - Abnormal; Notable for the following components:   Color, Urine YELLOW (*)    APPearance CLOUDY (*)    Hgb urine dipstick SMALL (*)    Protein, ur 100 (*)    Leukocytes,Ua TRACE (*)    Bacteria, UA FEW (*)    All other components within normal limits  LACTIC ACID, PLASMA - Abnormal; Notable for the following components:   Lactic Acid, Venous 4.3 (*)    All other components within  normal limits  LACTIC ACID, PLASMA - Abnormal; Notable for the following components:   Lactic Acid, Venous 3.5 (*)    All other components within normal limits  BLOOD GAS, VENOUS - Abnormal; Notable for the following components:   pO2, Ven <31 (*)    All other components within normal limits  TROPONIN I (HIGH SENSITIVITY) - Abnormal; Notable for the following components:   Troponin I (High Sensitivity) 57 (*)    All other components within normal limits  TROPONIN I (HIGH SENSITIVITY) - Abnormal; Notable for the following components:   Troponin I (High Sensitivity) 64 (*)    All other components within normal limits  GASTROINTESTINAL PANEL BY PCR, STOOL (REPLACES STOOL CULTURE)  C DIFFICILE QUICK SCREEN W PCR REFLEX    CULTURE, BLOOD (SINGLE)  LIPID PANEL  HEMOGLOBIN A1C  CBC  CREATININE, SERUM  CBC  BASIC METABOLIC PANEL  POTASSIUM  POTASSIUM  POTASSIUM  POTASSIUM  POTASSIUM  POTASSIUM     EKG  EKG read and interpreted by me shows normal sinus rhythm at 96 normal axis there is flipped T's inferiorly and V5 and V6 which are new from previous EKG. CT of the chest abdomen and pelvis was read by radiology reviewed and interpreted by Medium not sure  about the radiology reading of the catheter being in the small bowel that was possible.  Below is the reading which I agree wi1. Left lower lobe pneumonia or aspiration with small layering parapneumonic effusion. 2. Aortic and coronary artery atherosclerosis. No thoracic or abdominal aortic aneurysm. 3. Aortic valve leaflet calcifications. Echocardiography may be helpful to assess for aortic stenosis. 4. Diffuse mesenteric haziness/edema. Unclear whether this is related to fluid overload, congestive, malnutrition or hepatic dysfunction. Only minimal ascites. No free air. 5. Cholelithiasis without evidence of cholecystitis. 6. Nonobstructive micronephrolithiasis. 7. 2 cm cystic lesion at the junction of the body and tail of the pancreas. MRI without and with contrast recommended when clinically feasible. Benign and malignant etiologies are possible. 8. 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 No parastomal hernia. 9. Slightly dilated left mid abdominal small bowel segments, unknown if this is due to partial SBO or ileus. 10. The rectum remains, and there is rectal thickening and surrounding stranding which could be congestive or due to proctitis. 11. Thickened stomach and scattered mid to lower abdominal small bowel segments which could be due to gastroenteritis or congestive. 12. Bladder catheterized but despite this is still distended with air and fluid. Clinical correlation recommended for catheter dysfunction. Cystitis versus bladder nondistention. 13. Prostatomegaly. 14. 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 15. Remaining findings described above.th  RADIOLOGY Chest x-ray read by radiology reviewed and interpreted by me shows no acute disease Head CT shows no acute disease read by radiology reviewed and interpreted by me   PROCEDURES:  Critical Care  performed: Critical care time half an hour.  This includes seeing the patient return to see the patient again with his wife when he was much more alert and then seeing the patient 1 more time after the wife of the left when he was much less alert.  Discussed the patient in detail with hospitalist.  I really do not think the patient is quite stable enough to go on a long ambulance ride to the [place] so I have not contacted the [place] at this point.  Patient's blood pressure is down.  We  will have to give more fluids.  He is got sepsis orders going.  Procedures   MEDICATIONS ORDERED IN ED: Medications  lactated ringers infusion ( Intravenous New Bag/Given 07/07/22 2313)  Oral care mouth rinse (has no administration in time range)  Oral care mouth rinse (has no administration in time range)   stroke: early stages of recovery book (has no administration in time range)  LORazepam (ATIVAN) injection 2 mg (has no administration in time range)  acetaminophen (TYLENOL) tablet 650 mg (has no administration in time range)    Or  acetaminophen (TYLENOL) suppository 650 mg (has no administration in time range)  ondansetron (ZOFRAN) tablet 4 mg (has no administration in time range)    Or  ondansetron (ZOFRAN) injection 4 mg (has no administration in time range)  enoxaparin (LOVENOX) injection 40 mg (has no administration in time range)  Ampicillin-Sulbactam (UNASYN) 3 g in sodium chloride 0.9 % 100 mL IVPB (has no administration in time range)  vancomycin (VANCOREADY) IVPB 750 mg/150 mL (has no administration in time range)  lactated ringers bolus 1,000 mL (0 mLs Intravenous Stopped 07/07/22 2058)    And  lactated ringers bolus 1,000 mL (0 mLs Intravenous Stopped 07/07/22 2237)    And  lactated ringers bolus 250 mL (0 mLs Intravenous Stopped 07/07/22 2244)  ceFEPIme (MAXIPIME) 2 g in sodium chloride 0.9 % 100 mL IVPB (0 g Intravenous Stopped 07/07/22 2033)  metroNIDAZOLE (FLAGYL) IVPB 500 mg (0 mg Intravenous  Stopped 07/07/22 2057)  vancomycin (VANCOREADY) IVPB 1250 mg/250 mL (0 mg Intravenous Stopped 07/07/22 2237)  iohexol (OMNIPAQUE) 300 MG/ML solution 80 mL (80 mLs Intravenous Contrast Given 07/07/22 1940)     IMPRESSION / MDM / ASSESSMENT AND PLAN / ED COURSE  I reviewed the triage vital signs and the nursing notes.   Differential diagnosis includes, but is not limited to, pneumonia hospital-acquired.  Patient also has Foley which is not draining well and we have to irrigate.  Patient's belly was not tender or distended when I saw him some not as worried about something going on inside his abdomen although it certainly still a possibility.  Patient is quite sick.  Patient's presentation is most consistent with acute presentation with potential threat to life or bodily function.  he patient is on the cardiac monitor to evaluate for evidence of arrhythmia and/or significant heart rate changes.  None have been seen.      FINAL CLINICAL IMPRESSION(S) / ED DIAGNOSES   Final diagnoses:  Altered mental status, unspecified altered mental status type  AKI (acute kidney injury) (HCC)  HCAP (healthcare-associated pneumonia)  Sepsis due to other etiology [facility])     Rx / DC Orders   ED Discharge Orders     None        Note:  This document was prepared using Dragon voice recognition software and may include unintentional dictation errors.   Arnaldo Natal, MD 07/08/22 0001

## 2022-07-07 NOTE — ED Triage Notes (Signed)
"  History of dementia, had 2 episodes today of starring off in to space today, call came out as possible seizures. Had recent surgery for bowel perforation and has been on antibiotics" per EMS

## 2022-07-07 NOTE — Assessment & Plan Note (Deleted)
CT showing distended bladder in spite of Foley We will attempt repositioning Foley UA minimally abnormal so UTI not suspected at this time

## 2022-07-07 NOTE — Assessment & Plan Note (Signed)
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

## 2022-07-07 NOTE — ED Notes (Signed)
Patient transported to CT 

## 2022-07-07 NOTE — Sepsis Progress Note (Signed)
Monitoring for Sepsis.

## 2022-07-07 NOTE — H&P (Addendum)
History and Physical    Patient: Carl Figueroa NWG:[id number] DOB: 1946/01/11 DOA: 07/07/2022 DOS: the patient was seen and examined on 07/07/2022 PCP: Mick Sell, MD  Patient coming from: Home  Chief Complaint:  Chief Complaint  Patient presents with   Altered Mental Status    HPI: Carl Figueroa is a 76 y.o. male with medical history significant for Hypertension, dementia, CKD 3a, discharged 11/17 from a 1 month admission at the [facility] for appendicitis and intra-abdominal abscesses where he underwent laparotomy with  colectomy and ileostomy placement and discharged with Foley catheter and Augmentin and Zyvox until 12/17(discharge summary unavailable), who was brought to the ED for staring episode x2 since arrival home, once on the day of discharge on 11/17 and another on 11/18.  He was unresponsive to his name during the episode.  Wife was concerned about a seizure and called EMS.  Patient is intermittently somnolent and unable to contribute to history.  When awakened he denies complaints.ED course and data review: BP nadir 79/45 in the ED, fluid responsive to 118/58.  Pulse 106 on arrival improving to 86 with fluids.  Respirations 21, O2 sat 96% on room air.  VBG on room air with pH 7.29 and PCO2 53.  Labs otherwise significant for WBC 15,400 with lactic acid 3.5.  Hemoglobin 9.4, down from baseline of 13.5 about 5 months prior.  Creatinine 1.79, up from baseline of 1.35.  Potassium 5.8.  Urinalysis with trace leukocytes.  Troponin 64. EKG, personally viewed and interpreted shows sinus rhythm at 96 with nonspecific ST-T wave changes.  CT head nonacute.  CT chest abdomen and pelvis significant for left lower lobe pneumonia with other details outlined as follows: IMPRESSION: 1. Left lower lobe pneumonia or aspiration with small layering parapneumonic effusion. 2. Aortic and coronary artery atherosclerosis. No thoracic or abdominal aortic aneurysm. 3. Aortic valve leaflet  calcifications. Echocardiography may be helpful to assess for aortic stenosis. 4. Diffuse mesenteric haziness/edema. Unclear whether this is related to fluid overload, congestive, malnutrition or hepatic dysfunction. Only minimal ascites. No free air. 5. Cholelithiasis without evidence of cholecystitis. 6. Nonobstructive micronephrolithiasis. 7. 2 cm cystic lesion at the junction of the body and tail of the pancreas. MRI without and with contrast recommended when clinically feasible. Benign and malignant etiologies are possible. 8. 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 No parastomal hernia. 9. Slightly dilated left mid abdominal small bowel segments, unknown if this is due to partial SBO or ileus. 10. The rectum remains, and there is rectal thickening and surrounding stranding which could be congestive or due to proctitis. 11. Thickened stomach and scattered mid to lower abdominal small bowel segments which could be due to gastroenteritis or congestive. 12. Bladder catheterized but despite this is still distended with air and fluid. Clinical correlation recommended for catheter dysfunction. Cystitis versus bladder nondistention. 13. Prostatomegaly. 14. 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 15. Remaining findings described above.  Patient treated with sepsis fluids started on cefepime and metronidazole and vancomycin and hospitalist consulted for admission.     Past Medical History:  Diagnosis Date   Anxiety    Arthritis    Hypertension    Lewy body dementia (HCC)    Past Surgical History:  Procedure Laterality Date   COLONOSCOPY     FOOT SURGERY     X2   HERNIA REPAIR Right    INGUINAL  perforated bowel  05/2022   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   Procedure: TOTAL HIP ARTHROPLASTY ANTERIOR APPROACH;  Surgeon: Kennedy Bucker, MD;  Location:  [facility] ORS;  Service: Orthopedics;  Laterality: Left;   Social History:  reports that he quit smoking about 2 years ago. His smoking use included cigarettes. He has a 5.00 pack-year smoking history. He has never used smokeless tobacco. He reports that he does not drink alcohol and does not use drugs.  Allergies  Allergen Reactions   Morphine Nausea Only    Family History  Problem Relation Age of Onset   Cancer Mother    Diabetes Mellitus II Neg Hx     Prior to Admission medications   Medication Sig Start Date End Date Taking? Authorizing Provider  amoxicillin-clavulanate (AUGMENTIN) 875-125 MG tablet Take 1 tablet by mouth 2 (two) times daily. 07/06/22 08/05/22 Yes [provider]  finasteride (PROSCAR) 5 MG tablet Take 5 mg by mouth daily. 07/06/22 08/05/22 Yes [provider]  linezolid (ZYVOX) 600 MG tablet Take 600 mg by mouth 2 (two) times daily. 07/06/22 08/05/22 Yes [provider]  lisinopril (ZESTRIL) 40 MG tablet Take 20 mg by mouth every morning.    Yes [provider]  potassium chloride (KLOR-CON) 10 MEQ tablet Take 1 tablet (10 mEq total) by mouth daily. 05/18/22  Yes Furth, Cadence H, PA-C  sertraline (ZOLOFT) 25 MG tablet Take 25 mg by mouth daily.   Yes [provider]  tamsulosin (FLOMAX) 0.4 MG CAPS capsule Take 0.4 mg by mouth daily. 07/06/22 08/05/22 Yes [provider]  traZODone (DESYREL) 50 MG tablet Take 50 mg by mouth 2 (two) times daily. 07/06/22 08/05/22 Yes [provider]  furosemide (LASIX) 40 MG tablet Take 1 tablet (40 mg total) by mouth daily as needed. As needed for leg edema 05/09/22 06/09/23  Marianne Sofia, [place]    Physical Exam: Vitals:   07/07/22 1900 07/07/22 2008 07/07/22 2053 07/07/22 2135  BP: (!) 103/54 (!) 110/52 (!) 118/58 (!) 83/71  Pulse: 87     Resp: (!) 24 19 14 17   Temp:      TempSrc:      SpO2: 99%     Weight:      Height:       Physical Exam  Labs on Admission:  I have personally reviewed following labs and imaging studies  CBC: Recent Labs  Lab 07/07/22 1633  WBC 15.4*  NEUTROABS 13.4*  HGB 9.4*  HCT 29.3*  MCV 86.7  PLT 403*   Basic Metabolic Panel: Recent Labs  Lab 07/07/22 1633  NA 143  K 5.8*  CL 111  CO2 22  GLUCOSE 143*  BUN 24*  CREATININE 1.79*  CALCIUM 10.7*   GFR: Estimated Creatinine Clearance: 32.8 mL/min (A) (by C-G formula based on SCr of 1.79 mg/dL (H)). Liver Function Tests: Recent Labs  Lab 07/07/22 1633  AST 35  ALT 20  ALKPHOS 127*  BILITOT 0.8  PROT 8.7*  ALBUMIN 2.8*   No results for input(s): "LIPASE", "AMYLASE" in the last 168 hours. No results for input(s): "AMMONIA" in the last 168 hours. Coagulation Profile: No results for input(s): "INR", "PROTIME" in the last 168 hours. Cardiac Enzymes: No results for input(s): "CKTOTAL", "CKMB", "CKMBINDEX", "TROPONINI" in the last 168 hours. BNP (last 3 results) No results for input(s): "PROBNP" in the last 8760 hours. HbA1C: No results for input(s): "HGBA1C" in the last 72 hours. CBG: No results for input(s): "GLUCAP" in the  last 168 hours. Lipid Profile: No results for input(s): "CHOL", "HDL", "LDLCALC", "TRIG", "CHOLHDL", "LDLDIRECT" in the last 72 hours. Thyroid Function Tests: No results for input(s): "TSH", "T4TOTAL", "FREET4", "T3FREE", "THYROIDAB" in the last 72 hours. Anemia Panel: No results for input(s): "VITAMINB12", "FOLATE", "FERRITIN", "TIBC", "IRON", "RETICCTPCT" in the last 72 hours. Urine analysis:    Component Value Date/Time   COLORURINE YELLOW (A) 07/07/2022 1633   APPEARANCEUR CLOUDY (A) 07/07/2022 1633   LABSPEC 1.017 07/07/2022 1633   PHURINE 5.0 07/07/2022 1633   GLUCOSEU NEGATIVE 07/07/2022 1633   HGBUR SMALL (A) 07/07/2022 1633   BILIRUBINUR NEGATIVE 07/07/2022 1633   KETONESUR NEGATIVE 07/07/2022 1633   PROTEINUR 100 (A) 07/07/2022 1633   NITRITE NEGATIVE 07/07/2022 1633   LEUKOCYTESUR TRACE (A) 07/07/2022 1633     Radiological Exams on Admission: CT CHEST ABDOMEN PELVIS W CONTRAST  Result Date: 07/07/2022 CLINICAL DATA:  Recent surgery for perforated bowel at some point last month, presents with acute abdominal pain with leukocytosis. EXAM: CT CHEST, ABDOMEN, AND PELVIS WITH CONTRAST TECHNIQUE: Multidetector CT imaging of the chest, abdomen and pelvis was performed following the standard protocol during bolus administration of intravenous contrast. RADIATION DOSE REDUCTION: This exam was performed according to the departmental dose-optimization program which includes automated exposure control, adjustment of the mA and/or kV according to patient size and/or use of iterative reconstruction technique. CONTRAST:  80mL OMNIPAQUE IOHEXOL 300 MG/ML  SOLN COMPARISON:  Portable chest today, chest radiograph 07/06/2019. No prior chest CT. Comparison CT abdomen and pelvis without contrast is available dated 07/06/2019. FINDINGS: CT CHEST FINDINGS Cardiovascular: The cardiac size is normal. There is no pericardial effusion. There are scattered calcific plaques in the LAD coronary artery. There are scattered calcifications of the aortic valve leaflets, mild calcific plaques in the aortic arch and subclavian arteries. There is no aortic or great vessel aneurysm, dissection or stenosis. The pulmonary arteries and veins are normal caliber. The pulmonary arteries are centrally clear but not evaluated distal to the hila. Mediastinum/Nodes: No enlarged mediastinal, hilar, or axillary lymph nodes. Thyroid gland, trachea, and esophagus demonstrate no significant findings. Lungs/Pleura: Eventration and asymmetric elevation noted left hemidiaphragm. There is patchy airspace disease in the left lower lobe consistent with pneumonia or aspiration with small layering parapneumonic pleural effusion without evidence of empyema. There is no right pleural effusion. No pneumothorax or pleural thickening. Respiratory motion is noted on exam.  Central airways and remaining lungs are clear, as visualized. Musculoskeletal: Moderate bilateral shoulder DJD. Mild dextroscoliosis of advanced marginal osteophytosis thoracic spine. 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 No acute or other significant osseous findings. No chest wall mass is seen. CT ABDOMEN PELVIS FINDINGS Technical note: Limited image resolution in the abdomen and pelvis due to patient motion, breathing motion, and streak artifact from the patient's arms in the field and overlying wires. Hepatobiliary: No obvious abnormality. There are tiny stones in the gallbladder proximally but no wall thickening. The bile ducts are normal caliber. Pancreas: No solid mass enhancement. There is a 2 cm homogeneous rounded cystic lesion at the junction of the body and tail of the pancreas, posterior aspect, Hounsfield density of 7 units (see series 2, axial images 54-56). This could be a pseudocyst, serous or mucinous cystadenoma or side branch IPMN. Other etiologies such as malignancy are possible especially given the motion and streak artifacts through the area. MRI without and with contrast recommended when clinically feasible. There  is no pancreatic ductal dilatation or other focal abnormality. Spleen: Not well seen but no obvious abnormality.  No splenomegaly. Adrenals/Urinary Tract: There is no adrenal mass. There is a 1 cm homogeneous cyst in the posterior aspect of the left kidney, Hounsfield density of 7 units. There are a few additional bilateral scattered cortical hypodensities which are too small to characterize. No follow-up imaging is recommended. For reference see JACR 2018 Feb; 264-273, Management of the Incidental Renal Mass on CT, [employer] 2021; 814-848, Bosniak Classification of Cystic Renal Masses, Version 2019. There is bilateral mild renal cortical thinning. There are punctate  nonobstructive caliceal stones posteriorly in left kidney. No nephrolithiasis is seen on the right. No hydronephrosis or ureteral stones. The bladder is catheterized but despite this is still distended with air and fluid. Clinical correlation recommended for catheter dysfunction. There is mild bladder thickening versus underdistention. Stomach/Bowel: The patient appears to have undergone a near total colectomy. The rectum remains and is sutured closed in the presacral space, with mild rectal wall thickening and perirectal stranding, the latter which could be congestive or related to fluid overload or proctitis. 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 A pigtail catheter is noted entering the lateral right mid abdominal wall with the pigtail within a right mid abdominal small bowel segment. There are a few slightly dilated left mid abdominal small bowel segments, maximum caliber 2.9 cm, unknown if this is due to partial small bowel obstruction or ileus but there is no bowel dilatation elsewhere in the abdomen or pelvis. A transitional segment could not be found. Vascular/Lymphatic: There is diffuse mesenteric haziness. Unclear whether related to fluid overload, congestive, malnutrition or hepatic dysfunction. The abdominal aorta is unremarkable. There are patchy calcific plaques in the common iliac and internal iliac arteries. The portal vein is normal caliber and patent. Accounting for patient motion no enlarged lymph nodes are seen throughout. Reproductive: The prostate is not well seen due to streak artifact from left hip replacement, but transversely it measures enlarged at 5.4 cm. Seminal vesicles are normal in size. Both testicles are in the scrotal sac and there are small scrotal hydroceles. Other: Minimal abdominal and pelvic ascites. There is no free air, abscess or free  hemorrhage. Presence of diffuse mesenteric edema limits assessment for focal inflammatory change. There has been interval weight loss since 07/06/2019 with reduction in the intra-abdominal and subcutaneous body fat stores. There is mild body wall anasarca. Musculoskeletal: Left THA. Moderately advanced right hip DJD. There is dextroscoliosis and advanced degenerative change of the lumbar spine. Advanced facet hypertrophy L3-4 down is noted with degenerative grade 1 anterolisthesis at L4-5 and L5-S1 and severe acquired foraminal and spinal canal stenosis at both levels. 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 There is no destructive bone lesion. IMPRESSION: 1. Left lower lobe pneumonia or aspiration with small layering parapneumonic effusion. 2. Aortic and coronary artery atherosclerosis. No thoracic or abdominal aortic aneurysm. 3. Aortic valve leaflet calcifications. Echocardiography may be helpful to assess for aortic stenosis. 4. Diffuse mesenteric haziness/edema. Unclear whether this is related to fluid overload, congestive, malnutrition or hepatic dysfunction. Only minimal ascites. No free air. 5. Cholelithiasis without evidence of cholecystitis. 6. Nonobstructive micronephrolithiasis. 7. 2 cm cystic lesion at the junction of the body and tail of the pancreas. MRI without and with contrast recommended when clinically feasible. Benign and malignant etiologies are possible.  8. 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 No parastomal hernia. 9. Slightly dilated left mid abdominal small bowel segments, unknown if this is due to partial SBO or ileus. 10. The rectum remains, and there is rectal thickening and surrounding stranding which could be congestive or due to proctitis. 11. Thickened stomach and scattered mid to lower abdominal small bowel segments which could be due to gastroenteritis or congestive. 12. Bladder catheterized but despite this is still distended with air and  fluid. Clinical correlation recommended for catheter dysfunction. Cystitis versus bladder nondistention. 13. Prostatomegaly. 14. 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 15. Remaining findings described above. Electronically Signed   By: Almira BarKeith  Chesser M.D.   On: 07/07/2022 20:41   CT Head Wo Contrast  Result Date: 07/07/2022 CLINICAL DATA:  Mental status change. EXAM: CT HEAD WITHOUT CONTRAST TECHNIQUE: Contiguous axial images were obtained from the base of the skull through the vertex without intravenous contrast. RADIATION DOSE REDUCTION: This exam was performed according to the departmental dose-optimization program which includes automated exposure control, adjustment of the mA and/or kV according to patient size and/or use of iterative reconstruction technique. COMPARISON:  Jan 11, 2022 CT scan the brain FINDINGS: Brain: Ventricles and sulci are prominent but stable. No mass effect or midline shift. No cyst subdural, epidural, or subarachnoid hemorrhage. Cerebellum, brainstem, and basal cisterns are normal. Scattered white matter changes are identified. No acute cortical ischemia or infarct. Vascular: No hyperdense vessel or unexpected calcification. Skull: Normal. Negative for fracture or focal lesion. Sinuses/Orbits: No acute finding. Other: None. IMPRESSION: Chronic white matter changes. No acute intracranial abnormalities. Electronically Signed   By: Gerome Samavid  Williams III M.D.   On: 07/07/2022 17:18   DG Chest Portable 1 View  Result Date: 07/07/2022 CLINICAL DATA:  Shortness of breath and tachycardia. EXAM: PORTABLE CHEST 1 VIEW COMPARISON:  07/06/2019 FINDINGS: The cardiomediastinal silhouette is unremarkable. Elevation of the LEFT hemidiaphragm with mild LEFT basilar atelectasis/scarring again noted. There is no evidence of focal airspace disease, pulmonary edema, suspicious pulmonary nodule/mass, pleural  effusion, or pneumothorax. No acute bony abnormalities are identified. IMPRESSION: No active disease. Electronically Signed   By: Harmon PierJeffrey  Hu M.D.   On: 07/07/2022 16:37     Data Reviewed: Relevant notes from primary care and specialist visits, past discharge summaries as available in EHR, including Care Everywhere. Prior diagnostic testing as pertinent to current admission diagnoses Updated medications and problem lists for reconciliation ED course, including vitals, labs, imaging, treatment and response to treatment Triage notes, nursing and pharmacy notes and ED provider's notes Notable results as noted in HPI   Assessment and Plan: Left lower lobe pneumonia Severe sepsis Sepsis criteria includes hypotension, tachycardia, leukocytosis with lactic acidosis, AKI and acute encephalopathy Likely source is left lower lobe pneumonia from aspiration based on findings of CT chest abdomen and pelvis Unasyn and vancomycin for aspiration and HCAP Sepsis fluids N.p.o. pending SLP eval Patient was discharged on 11/17 with a 1 month supply of Augmentin and Zyvox - ?  Bacteremia with complications +/-endocarditis [facility]  Foley catheter in place on admission CT showing distended bladder in spite of Foley We will attempt repositioning Foley UA minimally abnormal so UTI not suspected at this time  Staring episodes Possible TIAs Acute metabolic encephalopathy Reportedly 2 staring episodes at home during which she was unresponsive Likely related to sepsis but cannot  rule out a seizure CT head is negative We will get EEG, echocardiogram(if not recently done at the [place]) Continue rest cardiac monitoring Neurologic checks, fall and aspiration precautions Consider neurology consult  Acute renal failure superimposed on stage 3a chronic kidney disease (HCC) Secondary to sepsis IV hydration and monitor renal function and avoid  nephrotoxins  Hypotension Secondary to sepsis.  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[facility]) S/p laparotomy with near total colectomy 10-06/2022 Patient had appendicitis with intra-abdominal abscesses Follow up discharge summary  Hyperkalemia Hyperkalemia protocol Monitor and correct as needed   Dementia without behavioral disturbance (HCC) Delirium precautions  Essential hypertension Hold antihypertensives, Lasix and lisinopril due to hypotension     DVT prophylaxis: Lovenox  Consults: none  Advance Care Planning:   Code Status: Prior   Family Communication: none  Disposition Plan: Back to previous home environment  Severity of Illness: The appropriate patient status for this patient is INPATIENT. Inpatient status is judged to be reasonable and necessary in order to provide the required intensity of service to ensure the patient's safety. The patient's presenting symptoms, physical exam findings, and initial radiographic and laboratory data in the context of their chronic comorbidities is felt to place them at high risk for further clinical deterioration. Furthermore, it is not anticipated that the patient will be medically stable for discharge from the hospital within 2 midnights of admission.   * I certify that at the point of admission it is my clinical judgment that the patient will require inpatient hospital care spanning beyond 2 midnights from the point of admission due to high intensity of service, high risk for further deterioration and high frequency of surveillance required.*  Author: Andris Baumann, MD 07/07/2022 9:51 PM  For on call review [email or web address].

## 2022-07-07 NOTE — Assessment & Plan Note (Signed)
Hold antihypertensives, Lasix and lisinopril due to hypotension

## 2022-07-07 NOTE — Consult Note (Signed)
CODE SEPSIS - PHARMACY COMMUNICATION  **Broad Spectrum Antibiotics should be administered within 1 hour of Sepsis diagnosis**  Time Code Sepsis Called/Page Received: 1827  Antibiotics Ordered: 1827  Time of 1st antibiotic administration: 2021  Additional action taken by pharmacy: N/A  If necessary, Name of Provider/Nurse Contacted: N/A    Derrek Gu ,PharmD Clinical Pharmacist  07/07/2022  6:31 PM

## 2022-07-07 NOTE — Assessment & Plan Note (Addendum)
Possible TIAs Acute metabolic encephalopathy Reportedly 2 staring episodes at home during which she was unresponsive Likely related to sepsis but cannot rule out a seizure CT head is negative We will get EEG, echocardiogram(if not recently done at the [facility]) Continue rest cardiac monitoring Neurologic checks, fall and aspiration precautions Consider neurology consult

## 2022-07-07 NOTE — Assessment & Plan Note (Signed)
Patient currently on Augmentin and Zyvox from 11/17 to 12/17 Follow-up discharge summary from the [place] on 11/17 to see indication

## 2022-07-07 NOTE — Assessment & Plan Note (Addendum)
Severe sepsis Sepsis criteria includes hypotension, tachycardia, leukocytosis with lactic acidosis, AKI and acute encephalopathy Likely source is left lower lobe pneumonia from aspiration based on findings of CT chest abdomen and pelvis Unasyn and vancomycin for aspiration and HCAP Sepsis fluids N.p.o. pending SLP eval Patient was discharged on 11/17 with a 1 month supply of Augmentin and Zyvox - ?  Bacteremia with complications +/-endocarditis Attempt to obtain discharge summary from the [place]

## 2022-07-08 ENCOUNTER — Inpatient Hospital Stay: Payer: Medicare Other

## 2022-07-08 DIAGNOSIS — R404 Transient alteration of awareness: Secondary | ICD-10-CM

## 2022-07-08 DIAGNOSIS — I9589 Other hypotension: Secondary | ICD-10-CM

## 2022-07-08 DIAGNOSIS — A419 Sepsis, unspecified organism: Secondary | ICD-10-CM | POA: Diagnosis not present

## 2022-07-08 DIAGNOSIS — J189 Pneumonia, unspecified organism: Secondary | ICD-10-CM | POA: Diagnosis not present

## 2022-07-08 DIAGNOSIS — F0390 Unspecified dementia without behavioral disturbance: Secondary | ICD-10-CM

## 2022-07-08 DIAGNOSIS — N179 Acute kidney failure, unspecified: Secondary | ICD-10-CM

## 2022-07-08 DIAGNOSIS — N1831 Chronic kidney disease, stage 3a: Secondary | ICD-10-CM

## 2022-07-08 DIAGNOSIS — E875 Hyperkalemia: Secondary | ICD-10-CM

## 2022-07-08 DIAGNOSIS — R6520 Severe sepsis without septic shock: Secondary | ICD-10-CM

## 2022-07-08 DIAGNOSIS — I10 Essential (primary) hypertension: Secondary | ICD-10-CM

## 2022-07-08 DIAGNOSIS — Z9889 Other specified postprocedural states: Secondary | ICD-10-CM

## 2022-07-08 DIAGNOSIS — Z792 Long term (current) use of antibiotics: Secondary | ICD-10-CM

## 2022-07-08 DIAGNOSIS — G9341 Metabolic encephalopathy: Secondary | ICD-10-CM

## 2022-07-08 DIAGNOSIS — Z933 Colostomy status: Secondary | ICD-10-CM

## 2022-07-08 DIAGNOSIS — Z978 Presence of other specified devices: Secondary | ICD-10-CM

## 2022-07-08 LAB — LIPID PANEL
Cholesterol: 139 mg/dL (ref 0–200)
HDL: 34 mg/dL — ABNORMAL LOW (ref >40)
LDL Cholesterol: 86 mg/dL (ref 0–99)
Total CHOL/HDL Ratio: 4.1 RATIO
Triglycerides: 93 mg/dL (ref <150)
VLDL: 19 mg/dL (ref 0–40)

## 2022-07-08 LAB — CBC
HCT: 23.3 % — ABNORMAL LOW (ref 39.0–52.0)
Hemoglobin: 7.5 g/dL — ABNORMAL LOW (ref 13.0–17.0)
MCH: 27.7 pg (ref 26.0–34.0)
MCHC: 32.2 g/dL (ref 30.0–36.0)
MCV: 86 fL (ref 80.0–100.0)
Platelets: 302 10*3/uL (ref 150–400)
RBC: 2.71 MIL/uL — ABNORMAL LOW (ref 4.22–5.81)
RDW: 14.9 % (ref 11.5–15.5)
WBC: 12.4 10*3/uL — ABNORMAL HIGH (ref 4.0–10.5)
nRBC: 0 % (ref 0.0–0.2)

## 2022-07-08 LAB — BASIC METABOLIC PANEL
Anion gap: 4 — ABNORMAL LOW (ref 5–15)
BUN: 23 mg/dL (ref 8–23)
CO2: 25 mmol/L (ref 22–32)
Calcium: 10.1 mg/dL (ref 8.9–10.3)
Chloride: 115 mmol/L — ABNORMAL HIGH (ref 98–111)
Creatinine, Ser: 1.52 mg/dL — ABNORMAL HIGH (ref 0.61–1.24)
GFR, Estimated: 47 mL/min — ABNORMAL LOW (ref >60)
Glucose, Bld: 104 mg/dL — ABNORMAL HIGH (ref 70–99)
Potassium: 5.5 mmol/L — ABNORMAL HIGH (ref 3.5–5.1)
Sodium: 144 mmol/L (ref 135–145)

## 2022-07-08 LAB — POTASSIUM
Potassium: 4.8 mmol/L (ref 3.5–5.1)
Potassium: 4.7 mmol/L (ref 3.5–5.1)
Potassium: 5.3 mmol/L — ABNORMAL HIGH (ref 3.5–5.1)
Potassium: 4.9 mmol/L (ref 3.5–5.1)
Potassium: 4.7 mmol/L (ref 3.5–5.1)

## 2022-07-08 MED ORDER — SODIUM CHLORIDE 0.9 % IV SOLN
3.0000 g | Freq: Four times a day (QID) | INTRAVENOUS | Status: DC
  Administered 2022-07-08 – 2022-07-10 (×8): 3 g via INTRAVENOUS
  Filled 2022-07-08 (×2): qty 8
  Filled 2022-07-08: qty 3
  Filled 2022-07-08: qty 8
  Filled 2022-07-08: qty 3
  Filled 2022-07-08: qty 8
  Filled 2022-07-08: qty 3
  Filled 2022-07-08 (×4): qty 8

## 2022-07-08 MED ORDER — INFLUENZA VAC A&B SA ADJ QUAD 0.5 ML IM PRSY
0.5000 mL | PREFILLED_SYRINGE | INTRAMUSCULAR | Status: AC
  Administered 2022-07-09: 0.5 mL via INTRAMUSCULAR
  Filled 2022-07-08: qty 0.5

## 2022-07-08 MED ORDER — LACTATED RINGERS IV BOLUS
500.0000 mL | Freq: Once | INTRAVENOUS | Status: AC
  Administered 2022-07-08: 500 mL via INTRAVENOUS

## 2022-07-08 MED ORDER — HALOPERIDOL LACTATE 5 MG/ML IJ SOLN
2.0000 mg | Freq: Four times a day (QID) | INTRAMUSCULAR | Status: DC | PRN
  Administered 2022-07-08: 2 mg via INTRAVENOUS
  Filled 2022-07-08: qty 1

## 2022-07-08 MED ORDER — SODIUM CHLORIDE 0.9 % IV SOLN: INTRAVENOUS | Status: DC | PRN

## 2022-07-08 NOTE — ED Notes (Signed)
Pt report some buttock pain, turned pt

## 2022-07-08 NOTE — Evaluation (Signed)
Physical Therapy Evaluation Patient Details Name: Carl Figueroa MRN: [id number] DOB: 18-Jan-1946 Today's Date: 07/08/2022  History of Present Illness  Pt is a 76 y/o M admitted on 07/07/22. Pt's wife reports pt had 2 staring episodes following d/c home from [place] where pt was unresponsive to his name during the episode & wife was concerned about a seizure. Pt is being treated for LLL PNA & severe sepsis. PMH: HTN, lewy body dementia, CKD3A, 1 month admission at the [place] with d/c on 07/06/22 following tx for appendicitis & intraabdominal abscesses where he underwent laparotomy with colectomy & ileostomy placement, anxiety  Clinical Impression  Pt seen for PT evaluation with pt's wife [name]) present for beginning & end of session. Prior to admission to the [place] pt was ambulatory with a RW but since pt d/c from [place] pt has required extensive assistance. On this date, pt is only oriented to himself & requires +2 assist for supine<>sit. While sitting EOB pt begins shaking but pt endorses being cold & wife reports pt does shake when he's cold. Pt noted to have drainage from colostomy bag & nurse notified. Pt rolled L<>R to allow PT/OT to change sheets. PT & OT educated pt's wife on recommendation of STR upon d/c & pt's wife agreeable. Will continue to follow pt acutely to address activity tolerance, strengthening, bed mobility & transfers as able.   Recommendations for follow up therapy are one component of a multi-disciplinary discharge planning process, led by the attending physician.  Recommendations may be updated based on patient status, additional functional criteria and insurance authorization.  Follow Up Recommendations Skilled nursing-short term rehab (<3 hours/day) Can patient physically be transported by private vehicle: No    Assistance Recommended at Discharge Frequent or constant Supervision/Assistance  Patient can return home with the following  Two people to help with walking and/or  transfers;Two people to help with bathing/dressing/bathroom;Help with stairs or ramp for entrance;Direct supervision/assist for medications management;Assistance with feeding;Assist for transportation;Assistance with cooking/housework;Direct supervision/assist for financial management    Equipment Recommendations None recommended by PT (TBD in next venue)  Recommendations for Other Services       Functional Status Assessment Patient has had a recent decline in their functional status and demonstrates the ability to make significant improvements in function in a reasonable and predictable amount of time.     Precautions / Restrictions Precautions Precautions: Fall Precaution Comments: ostomy bag, JP drain Restrictions Weight Bearing Restrictions: No      Mobility  Bed Mobility Overal bed mobility: Needs Assistance Bed Mobility: Supine to Sit, Sit to Supine, Rolling Rolling: Total assist   Supine to sit: Total assist, +2 for physical assistance, HOB elevated Sit to supine: Total assist, +2 for physical assistance   General bed mobility comments: +2 to transition supine<>sit    Transfers                        Ambulation/Gait                  Stairs            Wheelchair Mobility    Modified Rankin (Stroke Patients Only)       Balance Overall balance assessment: Needs assistance Sitting-balance support: Feet unsupported, Bilateral upper extremity supported Sitting balance-Leahy Scale: Zero   Postural control: Posterior lean  Pertinent Vitals/Pain Pain Assessment Pain Assessment: Faces Faces Pain Scale: No hurt    Home Living Family/patient expects to be discharged to:: Private residence Living Arrangements: Spouse/significant other Available Help at Discharge: Family               Additional Comments: Per wife pt was ambulatory with RW    Prior Function                Mobility Comments: Per wife pt was ambulatory with RW prior to [place] admission. Upon d/c from [place] pt was immobile.       Hand Dominance        Extremity/Trunk Assessment   Upper Extremity Assessment Upper Extremity Assessment: Generalized weakness;Difficult to assess due to impaired cognition (BUE very rigid & flexed to trunk)    Lower Extremity Assessment Lower Extremity Assessment: Generalized weakness;Difficult to assess due to impaired cognition    Cervical / Trunk Assessment Cervical / Trunk Assessment:  (rounded shoulders)  Communication   Communication: No difficulties  Cognition Arousal/Alertness: Awake/alert Behavior During Therapy: Flat affect Overall Cognitive Status: Impaired/Different from baseline Area of Impairment: Orientation, Attention, Memory, Following commands, Safety/judgement, Awareness, Problem solving                 Orientation Level: Disoriented to, Place, Time, Situation   Memory: Decreased short-term memory, Decreased recall of precautions Following Commands: Follows one step commands inconsistently, Follows one step commands with increased time Safety/Judgement: Decreased awareness of safety, Decreased awareness of deficits Awareness: Intellectual Problem Solving: Slow processing, Decreased initiation, Difficulty sequencing, Requires verbal cues, Requires tactile cues General Comments: Pt with hx of lewy body dementia, per wife, pt's cognition is worse than baseline. Pt only oriented to self, not able to recall his wife's name. Follows simple commands inconsistently with extra time.        General Comments      Exercises     Assessment/Plan    PT Assessment Patient needs continued PT services  PT Problem List Decreased strength;Decreased coordination;Cardiopulmonary status limiting activity;Decreased range of motion;Decreased cognition;Decreased activity tolerance;Decreased knowledge of use of DME;Decreased balance;Decreased safety  awareness;Decreased mobility       PT Treatment Interventions DME instruction;Therapeutic exercise;Gait training;Balance training;Stair training;Neuromuscular re-education;Functional mobility training;Patient/family education;Therapeutic activities;Cognitive remediation;Wheelchair mobility training    PT Goals (Current goals can be found in the Care Plan section)  Acute Rehab PT Goals Patient Stated Goal: to get better PT Goal Formulation: With family Time For Goal Achievement: 07/22/22 Potential to Achieve Goals: Fair    Frequency Min 2X/week     Co-evaluation PT/OT/SLP Co-Evaluation/Treatment: Yes Reason for Co-Treatment: Complexity of the patient's impairments (multi-system involvement);Necessary to address cognition/behavior during functional activity;For patient/therapist safety PT goals addressed during session: Mobility/safety with mobility         AM-PAC PT "6 Clicks" Mobility  Outcome Measure Help needed turning from your back to your side while in a flat bed without using bedrails?: Total Help needed moving from lying on your back to sitting on the side of a flat bed without using bedrails?: Total Help needed moving to and from a bed to a chair (including a wheelchair)?: Total Help needed standing up from a chair using your arms (e.g., wheelchair or bedside chair)?: Total Help needed to walk in hospital room?: Total Help needed climbing 3-5 steps with a railing? : Total 6 Click Score: 6    End of Session Equipment Utilized During Treatment: Oxygen Activity Tolerance: Patient tolerated treatment well Patient left: in bed;with nursing/sitter  in room;with family/visitor present Nurse Communication: Mobility status PT Visit Diagnosis: Muscle weakness (generalized) (M62.81);Difficulty in walking, not elsewhere classified (R26.2);Other abnormalities of gait and mobility (R26.89)    Time: 1151-1209 PT Time Calculation (min) (ACUTE ONLY): 18 min   Charges:   PT  Evaluation $PT Eval High Complexity: 1 High          Lavone Nian, PT, DPT 07/08/22, 12:48 PM   Waunita Schooner 07/08/2022, 12:43 PM

## 2022-07-08 NOTE — Consult Note (Signed)
Subjective:   CC: Sepsis  HPI:  Carl Figueroa is a 76 y.o. male who was consulted by Para March for evaluation of above.  History obtained via chart review secondary to patient mentation.    Recently underwent abdominal surgery over at [facility] discharged home on long-term antibiotics.  Presenting to [facility] for altered mental status.  Work-up concerning for sepsis with possible sources likely from pneumonia.  CT finding noted as below.  Surgery consulted for further eval, considering his most recent surgery.  Past Medical History:  has a past medical history of Anxiety, Arthritis, Hypertension, and Lewy body dementia (HCC).  Past Surgical History:  has a past surgical history that includes Foot surgery; Hernia repair (Right); Colonoscopy; Total hip arthroplasty (Left, 11/03/2019); and perforated bowel (05/2022).  Family History: family history includes Cancer in his mother.  Social History:  reports that he quit smoking about 2 years ago. His smoking use included cigarettes. He has a 5.00 pack-year smoking history. He has never used smokeless tobacco. He reports that he does not drink alcohol and does not use drugs.  Current Medications:  Prior to Admission medications   Medication Sig Start Date End Date Taking? Authorizing Provider  amoxicillin-clavulanate (AUGMENTIN) 875-125 MG tablet Take 1 tablet by mouth 2 (two) times daily. 07/06/22 08/05/22 Yes [provider]  finasteride (PROSCAR) 5 MG tablet Take 5 mg by mouth daily. 07/06/22 08/05/22 Yes [provider]  linezolid (ZYVOX) 600 MG tablet Take 600 mg by mouth 2 (two) times daily. 07/06/22 08/05/22 Yes [provider]  lisinopril (ZESTRIL) 40 MG tablet Take 20 mg by mouth every morning.    Yes [provider]  potassium chloride (KLOR-CON) 10 MEQ tablet Take 1 tablet (10 mEq total) by mouth daily. 05/18/22  Yes Furth, Cadence H, PA-C  sertraline (ZOLOFT) 25 MG tablet Take 25 mg by mouth daily.   Yes  [provider]  tamsulosin (FLOMAX) 0.4 MG CAPS capsule Take 0.4 mg by mouth daily. 07/06/22 08/05/22 Yes [provider]  traZODone (DESYREL) 50 MG tablet Take 50 mg by mouth 2 (two) times daily. 07/06/22 08/05/22 Yes [provider]  furosemide (LASIX) 40 MG tablet Take 1 tablet (40 mg total) by mouth daily as needed. As needed for leg edema 05/09/22 06/09/23  Furth, Cadence H, PA-C    Allergies:  Allergies  Allergen Reactions   Morphine Nausea Only    ROS:  Unable to obtain secondary to patient mentation.  Objective:     BP 112/63 (BP Location: Right Arm)   Pulse 81   Temp 97.6 F (36.4 C) (Oral)   Resp 18   Ht 5\' 7"  (1.702 m)   Wt 68 kg   SpO2 100%   BMI 23.49 kg/m   Constitutional :  alert and no distress  Lymphatics/Throat:  no asymmetry, masses, or scars  Respiratory:  clear to auscultation bilaterally  Cardiovascular:  regular rate and rhythm  Gastrointestinal: soft, non-tender; bowel sounds normal; no masses,  no organomegaly and right lower quadrant ostomy site with bowel consistent with likely ileostomy with productive brown liquid stool.  Large caliber white intra-abdominal drain noted to be connected to a three-way stopcock with lower lock at the end.  Large plastic jar of some sort of reservoir noted at bedside not attached to anything .  Musculoskeletal: Steady gait and movement  Skin: Cool and moist, midline incision healing well  Psychiatric: Normal affect, non-agitated, not confused       LABS:  Latest Ref Rng & Units 07/08/2022    8:40 AM 07/08/2022    3:19 AM 07/07/2022    4:33 PM  CMP  Glucose 70 - 99 mg/dL  161104  096143   BUN 8 - 23 mg/dL  23  24   Creatinine 0.450.61 - 1.24 mg/dL  4.091.52  8.111.79   Sodium 914135 - 145 mmol/L  144  143   Potassium 3.5 - 5.1 mmol/L 4.8  5.5  5.8   Chloride 98 - 111 mmol/L  115  111   CO2 22 - 32 mmol/L  25  22   Calcium 8.9 - 10.3 mg/dL  78.210.1  95.610.7   Total Protein 6.5 - 8.1 g/dL   8.7   Total  Bilirubin 0.3 - 1.2 mg/dL   0.8   Alkaline Phos 38 - 126 U/L   127   AST 15 - 41 U/L   35   ALT 0 - 44 U/L   20       Latest Ref Rng & Units 07/08/2022    3:19 AM 07/07/2022    4:33 PM 01/11/2022    1:50 PM  CBC  WBC 4.0 - 10.5 K/uL 12.4  15.4  4.7   Hemoglobin 13.0 - 17.0 g/dL 7.5  9.4  21.313.0   Hematocrit 39.0 - 52.0 % 23.3  29.3  38.7   Platelets 150 - 400 K/uL 302  403  167     RADS: CLINICAL DATA:  Recent surgery for perforated bowel at some point last month, presents with acute abdominal pain with leukocytosis.   EXAM: CT CHEST, ABDOMEN, AND PELVIS WITH CONTRAST   TECHNIQUE: Multidetector CT imaging of the chest, abdomen and pelvis was performed following the standard protocol during bolus administration of intravenous contrast.   RADIATION DOSE REDUCTION: This exam was performed according to the departmental dose-optimization program which includes automated exposure control, adjustment of the mA and/or kV according to patient size and/or use of iterative reconstruction technique.   CONTRAST:  80mL OMNIPAQUE IOHEXOL 300 MG/ML  SOLN   COMPARISON:  Portable chest today, chest radiograph 07/06/2019. No prior chest CT. Comparison CT abdomen and pelvis without contrast is available dated 07/06/2019.   FINDINGS: CT CHEST FINDINGS   Cardiovascular: The cardiac size is normal. There is no pericardial effusion. There are scattered calcific plaques in the LAD coronary artery. There are scattered calcifications of the aortic valve leaflets, mild calcific plaques in the aortic arch and subclavian arteries.   There is no aortic or great vessel aneurysm, dissection or stenosis. The pulmonary arteries and veins are normal caliber. The pulmonary arteries are centrally clear but not evaluated distal to the hila.   Mediastinum/Nodes: No enlarged mediastinal, hilar, or axillary lymph nodes. Thyroid gland, trachea, and esophagus demonstrate no significant findings.    Lungs/Pleura: Eventration and asymmetric elevation noted left hemidiaphragm. There is patchy airspace disease in the left lower lobe consistent with pneumonia or aspiration with small layering parapneumonic pleural effusion without evidence of empyema. There is no right pleural effusion. No pneumothorax or pleural thickening.   Respiratory motion is noted on exam. Central airways and remaining lungs are clear, as visualized.   Musculoskeletal: Moderate bilateral shoulder DJD. Mild dextroscoliosis of advanced marginal osteophytosis thoracic spine. 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   No acute or other significant osseous findings. No chest wall mass is seen.   CT ABDOMEN PELVIS FINDINGS  Technical note: Limited image resolution in the abdomen and pelvis due to patient motion, breathing motion, and streak artifact from the patient's arms in the field and overlying wires.   Hepatobiliary: No obvious abnormality. There are tiny stones in the gallbladder proximally but no wall thickening. The bile ducts are normal caliber.   Pancreas: No solid mass enhancement. There is a 2 cm homogeneous rounded cystic lesion at the junction of the body and tail of the pancreas, posterior aspect, Hounsfield density of 7 units (see series 2, axial images 54-56).   This could be a pseudocyst, serous or mucinous cystadenoma or side branch IPMN. Other etiologies such as malignancy are possible especially given the motion and streak artifacts through the area. MRI without and with contrast recommended when clinically feasible.   There is no pancreatic ductal dilatation or other focal abnormality.   Spleen: Not well seen but no obvious abnormality.  No splenomegaly.   Adrenals/Urinary Tract: There is no adrenal mass. There is a 1 cm homogeneous cyst in the posterior aspect of the left  kidney, Hounsfield density of 7 units. There are a few additional bilateral scattered cortical hypodensities which are too small to characterize.   No follow-up imaging is recommended. For reference see JACR 2018 Feb; 264-273, Management of the Incidental Renal Mass on CT, [employer] 2021; 814-848, Bosniak Classification of Cystic Renal Masses, Version 2019.   There is bilateral mild renal cortical thinning. There are punctate nonobstructive caliceal stones posteriorly in left kidney. No nephrolithiasis is seen on the right. No hydronephrosis or ureteral stones.   The bladder is catheterized but despite this is still distended with air and fluid. Clinical correlation recommended for catheter dysfunction. There is mild bladder thickening versus underdistention.   Stomach/Bowel: The patient appears to have undergone a near total colectomy. The rectum remains and is sutured closed in the presacral space, with mild rectal wall thickening and perirectal stranding, the latter which could be congestive or related to fluid overload or proctitis.   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   A pigtail catheter is noted entering the lateral right mid abdominal wall with the pigtail within a right mid abdominal small bowel segment.   There are a few slightly dilated left mid abdominal small bowel segments, maximum caliber 2.9 cm, unknown if this is due to partial small bowel obstruction or ileus but there is no bowel dilatation elsewhere in the abdomen or pelvis. A transitional segment could not be found.   Vascular/Lymphatic: There is diffuse mesenteric haziness. Unclear whether related to fluid overload, congestive, malnutrition or hepatic dysfunction.   The abdominal aorta is unremarkable. There are patchy calcific plaques in the common iliac  and internal iliac arteries. The portal vein is normal caliber and patent. Accounting for patient motion no enlarged lymph nodes are seen throughout.   Reproductive: The prostate is not well seen due to streak artifact from left hip replacement, but transversely it measures enlarged at 5.4 cm. Seminal vesicles are normal in size. Both testicles are in the scrotal sac and there are small scrotal hydroceles.   Other: Minimal abdominal and pelvic ascites. There is no free air, abscess or free hemorrhage. Presence of diffuse mesenteric edema limits assessment for focal inflammatory change.   There has been interval weight loss since 07/06/2019 with reduction in the intra-abdominal and subcutaneous body fat stores. There is mild  body wall anasarca.   Musculoskeletal: Left THA. Moderately advanced right hip DJD. There is dextroscoliosis and advanced degenerative change of the lumbar spine.   Advanced facet hypertrophy L3-4 down is noted with degenerative grade 1 anterolisthesis at L4-5 and L5-S1 and severe acquired foraminal and spinal canal stenosis at both levels.   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 There is no destructive bone lesion.   IMPRESSION: 1. Left lower lobe pneumonia or aspiration with small layering parapneumonic effusion. 2. Aortic and coronary artery atherosclerosis. No thoracic or abdominal aortic aneurysm. 3. Aortic valve leaflet calcifications. Echocardiography may be helpful to assess for aortic stenosis. 4. Diffuse mesenteric haziness/edema. Unclear whether this is related to fluid overload, congestive, malnutrition or hepatic dysfunction. Only minimal ascites. No free air. 5. Cholelithiasis without evidence of cholecystitis. 6. Nonobstructive micronephrolithiasis. 7. 2 cm cystic lesion at the junction of the body and tail of the pancreas. MRI without and with contrast recommended when clinically feasible. Benign and malignant etiologies are  possible. 8. 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 No parastomal hernia. 9. Slightly dilated left mid abdominal small bowel segments, unknown if this is due to partial SBO or ileus. 10. The rectum remains, and there is rectal thickening and surrounding stranding which could be congestive or due to proctitis. 11. Thickened stomach and scattered mid to lower abdominal small bowel segments which could be due to gastroenteritis or congestive. 12. Bladder catheterized but despite this is still distended with air and fluid. Clinical correlation recommended for catheter dysfunction. Cystitis versus bladder nondistention. 13. Prostatomegaly. 14. 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 15. Remaining findings described above.     Electronically Signed   By: Almira Bar M.D.   On: 07/07/2022 20:41  Assessment:    Altered mental status secondary to likely sepsis Recent major abdominal surgery per chart report   Plan:     Patient unable to provide adequate history on Percocet drain or the details of recent abdominal surgery secondary to his mentation.  We will attempt to reach wife who supposedly is medical decision-maker to determine purpose of the reservoir and large intra-abdominal drain.  Patient otherwise has a very benign abdominal exam with productive stool from the ileostomy.  No concern for any intra-abdominal pathology despite the CT read as noted above.  The images were difficult to interpret secondary to patient body habitus.  Surgery will peripherally follow for now.  Please call with any additional questions or concerns.  will update the primary team once the purpose of the intra-abdominal drain in reservoir that was noted at bedside is determined   labs/images/medications/previous chart entries reviewed personally and relevant changes/updates noted  above.

## 2022-07-08 NOTE — TOC Initial Note (Signed)
Transition of Care [facility]) - Initial/Assessment Note    Patient Details  Name: Carl Figueroa MRN: [id number] Date of Birth: 31-Oct-1945  Transition [facility] CM/SW Contact:    Elliot Gurney [place], [place] Phone Number: 07/08/2022, 3:02 PM  Clinical Narrative:                 Consult received for SNF placement. This Education officer, museum met with patient's spouse to SNF recommendation. Patient's spouse agreeable stating that she is hopeful that patient to gain enough core strength to hold himself up. She confirms that patient is service connected through the [place] and was hospitalized at the [place] from 06/02/22-07/06/22. Patient discharged home with [facility] services through [place] -initial assessment only completed with the nurse. Patient has also been set up with an aid 2x per week with an agency that is contracted with the [place]. She plans to privately pay for additional days if needed. Patient's spouse confirms that patient has a hospital bed at home as well as a wheelchair , hoyer lift and ileostomy. Patient's spouse would like patient to discharge to a [facility] contracted facility.  Fl2 completed and will be faxed out to local facilities. Patient's spouse has a preference for [employer], would like to stay on the [place] area.  Transition of Care to continue to follow    [place], LCSW Transition of Care    Expected Discharge Plan: [place]     Patient Goals and CMS Choice Patient states their goals for this hospitalization and ongoing recovery are:: Spouse "I want him to get up and walk at least t wheelchair"   Choice offered to / list presented to : Spouse  Expected Discharge Plan and Services Expected Discharge Plan: [place] In-house Referral: Clinical Social Work   Post Acute Care Choice: [place] Living arrangements for the past 2 months: [place]                                      Prior  Living Arrangements/Services Living arrangements for the past 2 months: Single Family Home Lives with:: Spouse   Do you feel safe going back to the place where you live?: Yes (per spouse)      Need for Family Participation in Patient Care: Yes (Comment) Care giver support system in place?: Yes (comment) Current home services: DME Criminal Activity/Legal Involvement Pertinent to Current Situation/Hospitalization: No - Comment as needed  Activities of Daily Living      Permission Sought/Granted                  Emotional Assessment Appearance:: Appears older than stated age Attitude/Demeanor/Rapport: Unable to Assess Affect (typically observed): Unable to Assess Orientation: : Oriented to Self Alcohol / Substance Use: Not Applicable Psych Involvement: No (comment)  Admission diagnosis:  Severe sepsis ([place]) [A41.9, R65.20] Patient Active Problem List   Diagnosis Date Noted   Severe sepsis ([place]) 07/07/2022   Dementia without behavioral disturbance ([place]) 07/07/2022   Foley catheter in place on admission 07/07/2022   Colostomy present ([place]) 07/07/2022   Left lower lobe pneumonia 07/07/2022   S/P laparotomy 07/07/2022   Staring episodes 07/07/2022   Long term (current) use of antibiotics 07/07/2022   Hypotension 07/07/2022   Hyperkalemia 07/07/2022   Social discord 01/12/2022   Chronic post-traumatic stress disorder 08/03/2021   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/2021   Impotence 08/03/2021  Primary open angle glaucoma, 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   Tremor 08/03/2021   Memory loss 08/03/2021   Gait abnormality 08/03/2021   Status post total hip replacement, 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   Demand ischemia 07/30/2019   Anxiety 91/69/4503   Acute metabolic encephalopathy 88/82/8003   Hematuria 07/08/2019   Thrombocytopenia ([place]) 07/08/2019   Elevated troponin 07/08/2019   Sepsis ([place]) 07/07/2019   Essential hypertension 07/07/2019   Acute lower UTI 07/07/2019   Acute renal failure superimposed on stage 3a  chronic kidney disease ([place]) 07/07/2019   PCP:  Leonel Ramsay, MD Pharmacy:   [id number] [address] [id number]: [phone]: [phone]-[place]Phone: [phone]: [phone] [place] [address]Phone: [phone]: [phone]    Social Determinants of Health ([place] Interventions    Readmission Risk Interventions     No data to display

## 2022-07-08 NOTE — NC FL2 (Signed)
[place] LEVEL OF CARE SCREENING TOOL     IDENTIFICATION  Patient Name: Carl Figueroa Birthdate: [birth year] Sex: male Admission Date (Current Location): 07/07/2022  [facility] and [place] Number:  Engineering geologist and Address:  [facility], [address] [place], [place] [address]      Provider Number:    Attending Physician Name and Address:  Athena Masse, MD  Relative Name and Phone Number:  Berman Gurski [phone]    Current Level of Care: SNF Recommended Level of Care: [place] Prior Approval Number:    Date Approved/Denied:   PASRR Number: pending  Discharge Plan: SNF    Current Diagnoses: Patient Active Problem List   Diagnosis Date Noted   Severe sepsis ([place]) 07/07/2022   Dementia without behavioral disturbance ([place]) 07/07/2022   Foley catheter in place on admission 07/07/2022   Colostomy present ([place]) 07/07/2022   Left lower lobe pneumonia 07/07/2022   S/P laparotomy 07/07/2022   Staring episodes 07/07/2022   Long term (current) use of antibiotics 07/07/2022   Hypotension 07/07/2022   Hyperkalemia 07/07/2022   Social discord 01/12/2022   Chronic post-traumatic stress disorder 08/03/2021   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/2021   Impotence 08/03/2021   Primary open angle glaucoma, 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   Tremor 08/03/2021   Memory loss 08/03/2021   Gait abnormality 08/03/2021   Status post total hip replacement, 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   Demand ischemia 07/30/2019   Anxiety [id number]   Acute metabolic encephalopathy [id number]   Hematuria 07/08/2019   Thrombocytopenia ([place]) 07/08/2019   Elevated troponin 07/08/2019   Sepsis ([place]) 07/07/2019   Essential hypertension 07/07/2019   Acute lower UTI 07/07/2019   Acute renal failure superimposed on stage 3a chronic kidney disease ([place]) 07/07/2019    Orientation RESPIRATION BLADDER Height & Weight     Self  Normal External catheter Weight: 150 lb (68  kg) Height:  5\' 7"  (170.2 cm)  BEHAVIORAL SYMPTOMS/MOOD NEUROLOGICAL BOWEL NUTRITION STATUS      Ileostomy Diet  AMBULATORY STATUS COMMUNICATION OF NEEDS Skin   Extensive Assist Verbally Normal                       Personal Care Assistance Level of Assistance  Bathing, Feeding, Dressing Bathing Assistance: Maximum assistance Feeding assistance: Maximum assistance Dressing Assistance: Maximum assistance     Functional Limitations Info  Sight, Hearing, Speech          SPECIAL CARE FACTORS FREQUENCY  PT (By licensed PT), OT (By licensed OT)     PT Frequency: 5x per week OT Frequency: 5x per week            Contractures Contractures Info: Not present    Additional Factors Info  Code Status, Allergies Code Status Info: full code Allergies Info: Morphine-Nausea           Current Medications (07/08/2022):  This is the current hospital active medication list Current Facility-Administered Medications  Medication Dose Route Frequency Provider Last Rate Last Admin    stroke: early stages of recovery book   Does not apply Once Athena Masse, MD       acetaminophen (TYLENOL) tablet 650 mg  650 mg Oral Q4H PRN Athena Masse, MD       Or   acetaminophen (TYLENOL) suppository 650 mg  650 mg Rectal Q4H PRN Athena Masse, MD       Ampicillin-Sulbactam (UNASYN) 3 g in sodium chloride 0.9 % 100  mL IVPB  3 g Intravenous Q6H Tressie Ellis, RPH       enoxaparin (LOVENOX) injection 40 mg  40 mg Subcutaneous Q24H Andris Baumann, MD       [START ON 07/09/2022] influenza vaccine adjuvanted (FLUAD) injection 0.5 mL  0.5 mL Intramuscular Tomorrow-1000 Andris Baumann, MD       LORazepam (ATIVAN) injection 2 mg  2 mg Intravenous Q5 Min x 2 PRN Andris Baumann, MD       ondansetron [facility]) tablet 4 mg  4 mg Oral Q6H PRN Andris Baumann, MD       Or   ondansetron [facility]) injection 4 mg  4 mg Intravenous Q6H PRN Andris Baumann, MD       Oral care mouth rinse  15 mL Mouth  Rinse Q2H Andris Baumann, MD   15 mL at 07/08/22 1344   Oral care mouth rinse  15 mL Mouth Rinse PRN Andris Baumann, MD       vancomycin (VANCOREADY) IVPB 750 mg/150 mL  750 mg Intravenous Q24H Otelia Sergeant, [facility]         Discharge Medications: Please see discharge summary for a list of discharge medications.  Relevant Imaging Results:  Relevant Lab Results:   Additional Information SS# [id number]  Verna Czech [place], [place]

## 2022-07-08 NOTE — Hospital Course (Addendum)
76 year old with history of hypertension, dementia  CKD 3a, who lives in a nursing facility , recently discharged on 11/17 from a 1 month admission at the [place] for appendicitis and intra-abdominal abscesses where he underwent laparotomy with  total colectomy and ileostomy placement and discharged with Foley cathete, ileostomy  and JP drain brought to the ED for staring episode x2 since arrival home, once on the day of discharge on 11/17 and another on 11/18.  He was unresponsive to his name during the episode.  Wife was concerned about a seizure and called EMS .  On presentation he was hypotensive but responded to IV fluids.  Lab work showed leukocytosis, lactic acidosis, AKI.  CT head did not show any acute findings.  CT chest/abdomen/pelvis showed left lower lobe pneumonia, possible aspiration, diffuse mesenteric haziness.  Patient was started on broad-spectrum antibiotics for the suspicion of severe sepsis secondary to left lower lobe pneumonia.  Hospital course also remarkable for confusion, agitation but now has improved.  PT/OT recommending SNF.  Antibiotics has been changed to oral.  He is medically stable to return to skilled nursing facility whenever possible. Discharge delayed due to Thanking Giving holidays.TOC closely following  Per [facility] note: He " patient was discharged with Foley catheter, right lower quadrant percutaneous drain, ileostomy, he had CT abdomen pelvis [facility]/23 [facility].

## 2022-07-08 NOTE — ED Notes (Signed)
Pt positioned on right side. Pt resting comfortably

## 2022-07-08 NOTE — ED Notes (Signed)
Speech at bedside

## 2022-07-08 NOTE — Progress Notes (Addendum)
PROGRESS NOTE    Carl Figueroa   QIH:[id number] DOB: [birth year]  DOA: 07/07/2022 Date of Service: 07/08/22 PCP: Mick SellFitzgerald, Carl P, MD     Brief Narrative / Hospital Course:  Carl PughBilly Carlye Cathlean Figueroa is a 76 y.o. male with medical history significant for Hypertension, dementia, CKD 3a, discharged 11/17 from a 1 month admission [facility] for appendicitis and intra-abdominal abscesses where he underwent laparotomy with  colectomy and ileostomy placement and discharged with Foley catheter and Augmentin and Zyvox until 12/17(discharge summary unavailable), who was brought to the ED for staring episode x2 since arrival home, once on the day of discharge on 11/17 and another on 11/18.  He was unresponsive to his name during the episode.  Wife was concerned about a seizure and called EMS.  Patient is intermittently somnolent and unable to contribute to history.  When awakened he denies complaints. Wife admits that she declined SNF placement and wanted him to to go home with home health because she did not trust that the facilities would provide the care he needs or get out of bed.  11/18: in ED/early admission -  VS and labs: BP nadir 79/45 in the ED, fluid responsive to 118/58.  Pulse 106 on arrival improving to 86 with fluids.  Respirations 21, O2 sat 96% on room air.  VBG on room air with pH 7.29 and PCO2 53. WBC 15,400 with lactic acid 3.5.  Hemoglobin 9.4, down from baseline of 13.5 about 5 months prior. Creatinine 1.79, up from baseline of 1.35.  Potassium 5.8.  Urinalysis with trace leukocytes. Troponin 64. EKG sinus rhythm at 96 with nonspecific ST-T wave changes.    Imaging: CT head nonacute.  CT chest abdomen and pelvis significant for left lower lobe pneumonia/aspiration,  Diffuse mesenteric haziness/edema - related to fluid overload, congestive, malnutrition or hepatic dysfunction. Only minimal ascites. 2 cm cystic lesion of pancreas at the junction of the body and tail of the pancreas. MRI  without and with contrast recommended when clinically feasible. Slightly dilated left mid abdominal small bowel segments - partial SBO or ileus. Rectal thickening and surrounding stranding which could be congestive rectal tissue vs proctitis. Bladder distension despite catheterization. Prostatomegaly.  Treatment: In ED treated with sepsis fluids started on cefepime and metronidazole and vancomycin and hospitalist consulted for admission for severe sepsis likely LLL pneumonia concern for HCAP vs aspiration, started on Unasyn and vancomycin. Further Investigation: [facility] -  was discharged on 11/17 with a 1 month supply of Augmentin and Zyvox - ?  Bacteremia with complications +/-endocarditis. EEG ordered. CDiff, GI PCR ordered. SLP to see. Surgery consult re: CT findings.  IPAL discussion w/ wife - based on that note, pt remains full code and goal to go [facility] but wife may consider SNF.  11/19: pt remains confused, unable to complete swallow eval in early AM but on rounds he is alert, oriented to place and self. K improved but still high at 5.5. Cr improved to 1.52. Tropes flat. WBC improved to 12.4. Cdiff and GI PCR negative. PT/OT recs for SNF. SLP notes mild aspiration risk. Gen Surg (Dr Carl Figueroa) saw pt today - benign exam and no cocnern for intra-abdominal pathology despite the CT read as above, will follow peripherally.   Consultants:  General Surgery   Procedures: None       ASSESSMENT & PLAN:   Principal Problem:   Severe sepsis (HCC) Active Problems:   Left lower lobe pneumonia   Foley catheter  in place on admission   Acute metabolic encephalopathy   Staring episodes   Acute renal failure superimposed on stage 3a chronic kidney disease (HCC)   S/P laparotomy   Long term (current) use of antibiotics   Hypotension   Colostomy present (HCC)   Hyperkalemia   Essential hypertension   Dementia without behavioral disturbance (HCC)   Left lower lobe pneumonia Severe  sepsis Sepsis criteria includes hypotension, tachycardia, leukocytosis with lactic acidosis, AKI and acute encephalopathy Likely source is left lower lobe pneumonia  Gen Surg eval no concern for intraabdominal source  Unasyn and vancomycin for aspiration and HCAP Sepsis fluids N.p.o. pending SLP eval Patient was discharged on 11/17 with a 1 month supply of Augmentin and Zyvox - ?  Bacteremia with complications +/-endocarditis Attempt to obtain discharge summary from the [place]  Staring episodes Possible TIAs Acute metabolic encephalopathy CT head is negative We will get EEG Consider echocardiogram (if not recently done at the [facility]) Continue cardiac monitoring Neurologic checks, fall and aspiration precautions Consider neurology consult if no improvement   Colostomy present [facility]) S/p laparotomy with near total colectomy 10-06/2022 Patient had appendicitis with intra-abdominal abscesses Follow up discharge summary - awaiting records from the [place]  Essential hypertension Hold antihypertensives, Lasix and lisinopril due to hypotension  Acute renal failure superimposed on stage 3a chronic kidney disease (HCC) Secondary to sepsis IV hydration and monitor renal function and avoid nephrotoxins  Dementia without behavioral disturbance (HCC) At baseline, based on conversation with wife, patient walks and talks but has difficulty conversing and expressing himself.. Delirium precautions  Long term (current) use of antibiotics Patient currently on Augmentin and Zyvox from 11/17 to 12/17 Follow-up discharge summary from the [place] on 11/17 to see indication  Foley catheter in place on admission CT showing distended bladder in spite of Foley We will attempt repositioning Foley UA minimally abnormal so UTI not suspected at this time  Hypotension Secondary to sepsis.   Holding all antihypertensives Continue IV sepsis fluid resuscitation  Hyperkalemia Hyperkalemia protocol Monitor and correct  as needed     DVT prophylaxis: lovenox Pertinent IV fluids/nutrition: dysphagia 3 diet per SLP,  Central lines / invasive devices: s/p sepsis fluids   Code Status: FULL CODE per IPAL note   Disposition: inpatient TOC needs: PT/OT recs for SNF< wife has declined in the past, will d/w her pending clinical improvement.  Barriers to discharge / significant pending items: await cultures, pending clinical improvement, pend EEG results              Subjective:  Patient reports no concerns He can tell me his name and he is in the hospital, he follows basic commands, he reports year is 25 and he is unsure who is president of the [place]. He cannot tell me why he was brought to the ER.  Denies CP/SOB.  Pain controlled.     Family Communication: will reach out to wife later today     Objective Findings:  Vitals:   07/08/22 1015 07/08/22 1100 07/08/22 1226 07/08/22 1338  BP: 124/63 112/85 (!) 143/67 109/77  Pulse: 77 79 78 80  Resp: 19 18 18 16   Temp:   98.3 F (36.8 C) 98.3 F (36.8 C)  TempSrc:   Oral Oral  SpO2: 100% 100% 100% 100%  Weight:      Height:        Intake/Output Summary (Last 24 hours) at 07/08/2022 1738 Last data filed at 07/08/2022 1530 Gross per 24 hour  Intake  2237.86 ml  Output 800 ml  Net 1437.86 ml   Filed Weights   07/07/22 1626  Weight: 68 kg    Examination:  Constitutional:  VS as above General Appearance: alert, frail, NAD Respiratory: Minimal respiratory effort CTA at apices, reduced breath sounds at bases  Cardiovascular: S1/S2 normal No murmur RRR No rub/gallop auscultated No lower extremity edema Gastrointestinal: No tenderness Colostomy and drain in place.  Musculoskeletal:  No clubbing/cyanosis of digits Symmetrical movement in all extremities Neurological: No cranial nerve deficit on limited exam Alert Psychiatric: Poor judgment/insight Flat mood and affect       Scheduled Medications:    stroke: early  stages of recovery book   Does not apply Once   enoxaparin (LOVENOX) injection  40 mg Subcutaneous Q24H   [START ON 07/09/2022] influenza vaccine adjuvanted  0.5 mL Intramuscular Tomorrow-1000   mouth rinse  15 mL Mouth Rinse Q2H    Continuous Infusions:  ampicillin-sulbactam (UNASYN) IV     vancomycin      PRN Medications:  acetaminophen **OR** acetaminophen, LORazepam, ondansetron **OR** ondansetron (ZOFRAN) IV, mouth rinse  Antimicrobials:  Anti-infectives (From admission, onward)    Start     Dose/Rate Route Frequency Ordered Stop   07/08/22 2100  vancomycin (VANCOREADY) IVPB 750 mg/150 mL        750 mg 150 mL/hr over 60 Minutes Intravenous Every 24 hours 07/07/22 2248 07/14/22 2059   07/08/22 2000  Ampicillin-Sulbactam (UNASYN) 3 g in sodium chloride 0.9 % 100 mL IVPB        3 g 200 mL/hr over 30 Minutes Intravenous Every 6 hours 07/08/22 1422     07/08/22 0600  Ampicillin-Sulbactam (UNASYN) 3 g in sodium chloride 0.9 % 100 mL IVPB  Status:  Discontinued        3 g 200 mL/hr over 30 Minutes Intravenous Every 8 hours 07/07/22 2240 07/08/22 1422   07/07/22 1845  vancomycin (VANCOREADY) IVPB 1250 mg/250 mL        1,250 mg 166.7 mL/hr over 90 Minutes Intravenous  Once 07/07/22 1833 07/07/22 2237   07/07/22 1830  ceFEPIme (MAXIPIME) 2 g in sodium chloride 0.9 % 100 mL IVPB        2 g 200 mL/hr over 30 Minutes Intravenous  Once 07/07/22 1827 07/07/22 2033   07/07/22 1830  metroNIDAZOLE (FLAGYL) IVPB 500 mg        500 mg 100 mL/hr over 60 Minutes Intravenous  Once 07/07/22 1827 07/07/22 2057   07/07/22 1830  vancomycin (VANCOCIN) IVPB 1000 mg/200 mL premix  Status:  Discontinued        1,000 mg 200 mL/hr over 60 Minutes Intravenous  Once 07/07/22 1827 07/07/22 1833           Data Reviewed: I have personally reviewed following labs and imaging studies  CBC: Recent Labs  Lab 07/07/22 1633 07/08/22 0319  WBC 15.4* 12.4*  NEUTROABS 13.4*  --   HGB 9.4* 7.5*  HCT  29.3* 23.3*  MCV 86.7 86.0  PLT 403* 302   Basic Metabolic Panel: Recent Labs  Lab 07/07/22 1633 07/08/22 0319 07/08/22 0840 07/08/22 1131 07/08/22 1528  NA 143 144  --   --   --   K 5.8* 5.5* 4.8 4.7 5.3*  CL 111 115*  --   --   --   CO2 22 25  --   --   --   GLUCOSE 143* 104*  --   --   --   BUN  24* 23  --   --   --   CREATININE 1.79* 1.52*  --   --   --   CALCIUM 10.7* 10.1  --   --   --    GFR: Estimated Creatinine Clearance: 38.7 mL/min (A) (by C-G formula based on SCr of 1.52 mg/dL (H)). Liver Function Tests: Recent Labs  Lab 07/07/22 1633  AST 35  ALT 20  ALKPHOS 127*  BILITOT 0.8  PROT 8.7*  ALBUMIN 2.8*   No results for input(s): "LIPASE", "AMYLASE" in the last 168 hours. No results for input(s): "AMMONIA" in the last 168 hours. Coagulation Profile: No results for input(s): "INR", "PROTIME" in the last 168 hours. Cardiac Enzymes: No results for input(s): "CKTOTAL", "CKMB", "CKMBINDEX", "TROPONINI" in the last 168 hours. BNP (last 3 results) No results for input(s): "PROBNP" in the last 8760 hours. HbA1C: No results for input(s): "HGBA1C" in the last 72 hours. CBG: No results for input(s): "GLUCAP" in the last 168 hours. Lipid Profile: Recent Labs    07/08/22 0319  CHOL 139  HDL 34*  LDLCALC 86  TRIG 93  CHOLHDL 4.1   Thyroid Function Tests: No results for input(s): "TSH", "T4TOTAL", "FREET4", "T3FREE", "THYROIDAB" in the last 72 hours. Anemia Panel: No results for input(s): "VITAMINB12", "FOLATE", "FERRITIN", "TIBC", "IRON", "RETICCTPCT" in the last 72 hours. Most Recent Urinalysis On File:     Component Value Date/Time   COLORURINE YELLOW (A) 07/07/2022 1633   APPEARANCEUR CLOUDY (A) 07/07/2022 1633   LABSPEC 1.017 07/07/2022 1633   PHURINE 5.0 07/07/2022 1633   GLUCOSEU NEGATIVE 07/07/2022 1633   HGBUR SMALL (A) 07/07/2022 1633   BILIRUBINUR NEGATIVE 07/07/2022 1633   KETONESUR NEGATIVE 07/07/2022 1633   PROTEINUR 100 (A) 07/07/2022  1633   NITRITE NEGATIVE 07/07/2022 1633   LEUKOCYTESUR TRACE (A) 07/07/2022 1633   Sepsis Labs: (procalcitonin:4,lacticidven:4)  Recent Results (from the past 240 hour(s))  Gastrointestinal Panel by PCR , Stool     Status: None   Collection Time: 07/07/22  4:33 PM   Specimen: Stool  Result Value Ref Range Status   Campylobacter species NOT DETECTED NOT DETECTED Final   Plesimonas shigelloides NOT DETECTED NOT DETECTED Final   Salmonella species NOT DETECTED NOT DETECTED Final   Yersinia enterocolitica NOT DETECTED NOT DETECTED Final   Vibrio species NOT DETECTED NOT DETECTED Final   Vibrio cholerae NOT DETECTED NOT DETECTED Final   Enteroaggregative E coli (EAEC) NOT DETECTED NOT DETECTED Final   Enteropathogenic E coli (EPEC) NOT DETECTED NOT DETECTED Final   Enterotoxigenic E coli (ETEC) NOT DETECTED NOT DETECTED Final   Shiga like toxin producing E coli (STEC) NOT DETECTED NOT DETECTED Final   Shigella/Enteroinvasive E coli (EIEC) NOT DETECTED NOT DETECTED Final   Cryptosporidium NOT DETECTED NOT DETECTED Final   Cyclospora cayetanensis NOT DETECTED NOT DETECTED Final   Entamoeba histolytica NOT DETECTED NOT DETECTED Final   Giardia lamblia NOT DETECTED NOT DETECTED Final   Adenovirus F40/41 NOT DETECTED NOT DETECTED Final   Astrovirus NOT DETECTED NOT DETECTED Final   Norovirus GI/GII NOT DETECTED NOT DETECTED Final   Rotavirus A NOT DETECTED NOT DETECTED Final   Sapovirus (I, II, IV, and V) NOT DETECTED NOT DETECTED Final    Comment: Performed at [facility], [address] [place], [place] [address]  C Difficile Quick Screen w PCR reflex     Status: None   Collection Time: 07/07/22  4:33 PM   Specimen: Stool  Result Value Ref Range  Status   C Diff antigen NEGATIVE NEGATIVE Final   C Diff toxin NEGATIVE NEGATIVE Final   C Diff interpretation No C. difficile detected.  Final    Comment: Performed at [facility], [address]  [place], [place] [address]  Culture, blood (single)     Status: None (Preliminary result)   Collection Time: 07/07/22  6:34 PM   Specimen: BLOOD RIGHT ARM  Result Value Ref Range Status   Specimen Description BLOOD RIGHT ARM  Final   Special Requests   Final    BOTTLES DRAWN AEROBIC AND ANAEROBIC Blood Culture results may not be optimal due to an inadequate volume of blood received in culture bottles   Culture   Final    NO GROWTH < 12 HOURS Performed at [facility], [address], [place], [place] [address]    Report Status PENDING  Incomplete         Radiology Studies: US Carotid Bilateral (at [facility] and [facility] only)  Result Date: 07/08/2022 CLINICAL DATA:  Dementia EXAM: BILATERAL CAROTID DUPLEX ULTRASOUND TECHNIQUE: Wallace Cullens scale imaging, color Doppler and duplex ultrasound were performed of bilateral carotid and vertebral arteries in the neck. COMPARISON:  None Available. FINDINGS: Criteria: Quantification of carotid stenosis is based on velocity parameters that correlate the residual internal carotid diameter with NASCET-based stenosis levels, using the diameter of the distal internal carotid lumen as the denominator for stenosis measurement. The following velocity measurements were obtained: RIGHT ICA: 124 cm/sec CCA: 80 cm/sec SYSTOLIC ICA/CCA RATIO:  1.5 ECA: 93 cm/sec LEFT ICA: 91 cm/sec CCA: 83 cm/sec SYSTOLIC ICA/CCA RATIO:  1.1 ECA: 87 cm/sec RIGHT CAROTID ARTERY: Patent without plaque or intimal thickening RIGHT VERTEBRAL ARTERY:  Patent with antegrade flow LEFT CAROTID ARTERY:  Patent without plaque or intimal thickening LEFT VERTEBRAL ARTERY:  Patent with antegrade flow IMPRESSION: 1. Negative study. No carotid stenosis identified in the neck by Doppler criteria. 2. The vertebral arteries are patent with antegrade flow. Electronically Signed   By: Agustin Cree M.D.   On: 07/08/2022 09:17   CT CHEST ABDOMEN PELVIS W CONTRAST  Result Date: 07/07/2022 CLINICAL DATA:  Recent surgery  for perforated bowel at some point last month, presents with acute abdominal pain with leukocytosis. EXAM: CT CHEST, ABDOMEN, AND PELVIS WITH CONTRAST TECHNIQUE: Multidetector CT imaging of the chest, abdomen and pelvis was performed following the standard protocol during bolus administration of intravenous contrast. RADIATION DOSE REDUCTION: This exam was performed according to the departmental dose-optimization program which includes automated exposure control, adjustment of the mA and/or kV according to patient size and/or use of iterative reconstruction technique. CONTRAST:  80mL OMNIPAQUE IOHEXOL 300 MG/ML  SOLN COMPARISON:  Portable chest today, chest radiograph 07/06/2019. No prior chest CT. Comparison CT abdomen and pelvis without contrast is available dated 07/06/2019. FINDINGS: CT CHEST FINDINGS Cardiovascular: The cardiac size is normal. There is no pericardial effusion. There are scattered calcific plaques in the LAD coronary artery. There are scattered calcifications of the aortic valve leaflets, mild calcific plaques in the aortic arch and subclavian arteries. There is no aortic or great vessel aneurysm, dissection or stenosis. The pulmonary arteries and veins are normal caliber. The pulmonary arteries are centrally clear but not evaluated distal to the hila. Mediastinum/Nodes: No enlarged mediastinal, hilar, or axillary lymph nodes. Thyroid gland, trachea, and esophagus demonstrate no significant findings. Lungs/Pleura: Eventration and asymmetric elevation noted left hemidiaphragm. There is patchy airspace disease in the left lower lobe consistent with pneumonia or aspiration with  small layering parapneumonic pleural effusion without evidence of empyema. There is no right pleural effusion. No pneumothorax or pleural thickening. Respiratory motion is noted on exam. Central airways and remaining lungs are clear, as visualized. Musculoskeletal: Moderate bilateral shoulder DJD. Mild dextroscoliosis of  advanced marginal osteophytosis thoracic spine. 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 No acute or other significant osseous findings. No chest wall mass is seen. CT ABDOMEN PELVIS FINDINGS Technical note: Limited image resolution in the abdomen and pelvis due to patient motion, breathing motion, and streak artifact from the patient's arms in the field and overlying wires. Hepatobiliary: No obvious abnormality. There are tiny stones in the gallbladder proximally but no wall thickening. The bile ducts are normal caliber. Pancreas: No solid mass enhancement. There is a 2 cm homogeneous rounded cystic lesion at the junction of the body and tail of the pancreas, posterior aspect, Hounsfield density of 7 units (see series 2, axial images 54-56). This could be a pseudocyst, serous or mucinous cystadenoma or side branch IPMN. Other etiologies such as malignancy are possible especially given the motion and streak artifacts through the area. MRI without and with contrast recommended when clinically feasible. There is no pancreatic ductal dilatation or other focal abnormality. Spleen: Not well seen but no obvious abnormality.  No splenomegaly. Adrenals/Urinary Tract: There is no adrenal mass. There is a 1 cm homogeneous cyst in the posterior aspect of the left kidney, Hounsfield density of 7 units. There are a few additional bilateral scattered cortical hypodensities which are too small to characterize. No follow-up imaging is recommended. For reference see JACR 2018 Feb; 264-273, Management of the Incidental Renal Mass on CT, [employer] 2021; 814-848, Bosniak Classification of Cystic Renal Masses, Version 2019. There is bilateral mild renal cortical thinning. There are punctate nonobstructive caliceal stones posteriorly in left kidney. No nephrolithiasis is seen on the right. No hydronephrosis or ureteral stones.  The bladder is catheterized but despite this is still distended with air and fluid. Clinical correlation recommended for catheter dysfunction. There is mild bladder thickening versus underdistention. Stomach/Bowel: The patient appears to have undergone a near total colectomy. The rectum remains and is sutured closed in the presacral space, with mild rectal wall thickening and perirectal stranding, the latter which could be congestive or related to fluid overload or proctitis. 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 A pigtail catheter is noted entering the lateral right mid abdominal wall with the pigtail within a right mid abdominal small bowel segment. There are a few slightly dilated left mid abdominal small bowel segments, maximum caliber 2.9 cm, unknown if this is due to partial small bowel obstruction or ileus but there is no bowel dilatation elsewhere in the abdomen or pelvis. A transitional segment could not be found. Vascular/Lymphatic: There is diffuse mesenteric haziness. Unclear whether related to fluid overload, congestive, malnutrition or hepatic dysfunction. The abdominal aorta is unremarkable. There are patchy calcific plaques in the common iliac and internal iliac arteries. The portal vein is normal caliber and patent. Accounting for patient motion no enlarged lymph nodes are seen throughout. Reproductive: The prostate is not well seen due to streak artifact from left hip replacement, but transversely it measures enlarged at 5.4 cm. Seminal vesicles are normal in size. Both testicles are in the scrotal sac and there are small  scrotal hydroceles. Other: Minimal abdominal and pelvic ascites. There is no free air, abscess or free hemorrhage. Presence of diffuse mesenteric edema limits assessment for focal inflammatory change. There has been interval weight loss since  07/06/2019 with reduction in the intra-abdominal and subcutaneous body fat stores. There is mild body wall anasarca. Musculoskeletal: Left THA. Moderately advanced right hip DJD. There is dextroscoliosis and advanced degenerative change of the lumbar spine. Advanced facet hypertrophy L3-4 down is noted with degenerative grade 1 anterolisthesis at L4-5 and L5-S1 and severe acquired foraminal and spinal canal stenosis at both levels. 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 There is no destructive bone lesion. IMPRESSION: 1. Left lower lobe pneumonia or aspiration with small layering parapneumonic effusion. 2. Aortic and coronary artery atherosclerosis. No thoracic or abdominal aortic aneurysm. 3. Aortic valve leaflet calcifications. Echocardiography may be helpful to assess for aortic stenosis. 4. Diffuse mesenteric haziness/edema. Unclear whether this is related to fluid overload, congestive, malnutrition or hepatic dysfunction. Only minimal ascites. No free air. 5. Cholelithiasis without evidence of cholecystitis. 6. Nonobstructive micronephrolithiasis. 7. 2 cm cystic lesion at the junction of the body and tail of the pancreas. MRI without and with contrast recommended when clinically feasible. Benign and malignant etiologies are possible. 8. 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 No parastomal hernia. 9. Slightly dilated left mid abdominal small bowel segments, unknown if this is due to partial SBO or ileus. 10. The rectum remains, and there is rectal thickening and surrounding stranding which could be congestive or due to proctitis. 11. Thickened stomach and scattered mid to lower abdominal small bowel segments which could be due to gastroenteritis or congestive. 12. Bladder catheterized but despite this is still distended with air and fluid. Clinical correlation recommended for catheter dysfunction. Cystitis versus bladder nondistention. 13. Prostatomegaly. 14. Advanced  degenerative changes of the lumbar spine with severe acquired foraminal and spinal canal stenosis at the lowest 2 levels. 15. Remaining findings described above. Electronically Signed   By: Almira Bar M.D.   On: 07/07/2022 20:41   CT Head Wo Contrast  Result Date: 07/07/2022 CLINICAL DATA:  Mental status change. EXAM: CT HEAD WITHOUT CONTRAST TECHNIQUE: Contiguous axial images were obtained from the base of the skull through the vertex without intravenous contrast. RADIATION DOSE REDUCTION: This exam was performed according to the departmental dose-optimization program which includes automated exposure control, adjustment of the mA and/or kV according to patient size and/or use of iterative reconstruction technique. COMPARISON:  Jan 11, 2022 CT scan the brain FINDINGS: Brain: Ventricles and sulci are prominent but stable. No mass effect or midline shift. No cyst subdural, epidural, or subarachnoid hemorrhage. Cerebellum, brainstem, and basal cisterns are normal. Scattered white matter changes are identified. No acute cortical ischemia or infarct. Vascular: No hyperdense vessel or unexpected calcification. Skull: Normal. Negative for fracture or focal lesion. Sinuses/Orbits: No acute finding. Other: None. IMPRESSION: Chronic white matter changes. No acute intracranial abnormalities. Electronically Signed   By: Gerome Sam III M.D.   On: 07/07/2022 17:18   DG Chest Portable 1 View  Result Date: 07/07/2022 CLINICAL DATA:  Shortness of breath and tachycardia. EXAM: PORTABLE CHEST 1 VIEW COMPARISON:  07/06/2019 FINDINGS: The cardiomediastinal silhouette is unremarkable. Elevation of the LEFT hemidiaphragm with mild LEFT basilar atelectasis/scarring again noted. There is no evidence of focal airspace disease, pulmonary edema, suspicious pulmonary nodule/mass, pleural effusion, or pneumothorax. No acute bony abnormalities are identified. IMPRESSION: No active disease. Electronically Signed   By: Tinnie Gens  Hu M.D.   On: 07/07/2022 16:37            LOS: 1 day    Time spent: 50 mins    Sunnie Nielsen, DO [facility] 07/08/2022, 5:38 PM    Dictation software may have been used to generate the above note. Typos may occur and escape review in typed/dictated notes. Please contact Dr Lyn Hollingshead directly for clarity if needed.  Staff may message me via secure chat in Epic  but this may not receive an immediate response,  please page me for urgent matters!  If 7PM-7AM, please contact night coverage [email or web address]

## 2022-07-08 NOTE — Evaluation (Signed)
Occupational Therapy Evaluation Patient Details Name: Carl Figueroa MRN: [id number] DOB: 01-30-1946 Today's Date: 07/08/2022   History of Present Illness Pt is a 76 y/o M admitted on 07/07/22. Pt's wife reports pt had 2 staring episodes following d/c home from [place] where pt was unresponsive to his name during the episode & wife was concerned about a seizure. Pt is being treated for LLL PNA & severe sepsis. 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 Co tx completed with PT on this date. Pt is oriented to self only, requires increased time and processing throughout. Pt very recently discharged from the [place] following prolonged hospital stay where he required MAX-TOTAL A for all mobility/ADL. Wife chose to bring him home vs SNF. Pt presents with deficits in strength, endurance, activity tolerance, balance, cognition all affecting safe and optimal ADL completion. MAX-TOTAL A required for all ADL, bed mobility. At this time recommend discharge to STR to address deficits, wife educated on recommendation and in agreement.   Of note, ostomy noted to be leaking, RN in room to address following evaluation.      Recommendations for follow up therapy are one component of a multi-disciplinary discharge planning process, led by the attending physician.  Recommendations may be updated based on patient status, additional functional criteria and insurance authorization.   Follow Up Recommendations  Skilled nursing-short term rehab (<3 hours/day)     Assistance Recommended at Discharge Frequent or constant Supervision/Assistance  Patient can return home with the following Two people to help with walking and/or transfers;Two people to help with  bathing/dressing/bathroom    Functional Status Assessment  Patient has had a recent decline in their functional status and demonstrates the ability to make significant improvements in function in a reasonable and predictable amount of time.  Equipment Recommendations  Other (comment) (per next venue of care)    Recommendations for Other Services       Precautions / Restrictions Precautions Precautions: Fall Precaution Comments: ostomy bag, JP drain Restrictions Weight Bearing Restrictions: No      Mobility Bed Mobility Overal bed mobility: Needs Assistance Bed Mobility: Supine to Sit, Sit to Supine, Rolling Rolling: Total assist   Supine to sit: Total assist, +2 for physical assistance, HOB elevated Sit to supine: Total assist, +2 for physical assistance   General bed mobility comments: while seated on edge of bed pt tremulous, total A for boost up the bed    Transfers                          Balance Overall balance assessment: Needs assistance Sitting-balance support: Feet unsupported, Bilateral upper extremity supported Sitting balance-Leahy Scale: Zero   Postural control: Posterior lean                                 ADL either performed or assessed with clinical judgement   ADL Overall ADL's : Needs assistance/impaired                                       General ADL Comments: MAX A for  LB dressing, MAX A for UB dressing, TOTAL A for all ostomy care (RN present to address at end of session), MAX A for grooming tasks anticipated on this date     Vision   Additional Comments: will continue to assess     Perception     Praxis      Pertinent Vitals/Pain Pain Assessment Pain Assessment: No/denies pain     Hand Dominance     Extremity/Trunk Assessment Upper Extremity Assessment Upper Extremity Assessment: Generalized weakness;Difficult to assess due to impaired cognition (appears rigid throughout mobility,  especially in elbow extension)   Lower Extremity Assessment Lower Extremity Assessment: Generalized weakness;Difficult to assess due to impaired cognition   Cervical / Trunk Assessment Cervical / Trunk Assessment:  (rounded shoulders)   Communication Communication Communication: No difficulties   Cognition Arousal/Alertness: Awake/alert Behavior During Therapy: Flat affect Overall Cognitive Status: Impaired/Different from baseline Area of Impairment: Orientation, Attention, Memory, Following commands, Safety/judgement, Awareness, Problem solving                 Orientation Level: Disoriented to, Place, Time, Situation Current Attention Level: Focused Memory: Decreased short-term memory, Decreased recall of precautions Following Commands: Follows one step commands inconsistently, Follows one step commands with increased time Safety/Judgement: Decreased awareness of safety, Decreased awareness of deficits Awareness: Intellectual Problem Solving: Slow processing, Decreased initiation, Difficulty sequencing, Requires verbal cues, Requires tactile cues General Comments: history of lewy body dementia, pt unable to remember wifes name.     General Comments  vital signs monitored and spo2 down to 60s with seated on edge of bed however ?pleth with return to >90% while in bed immediately    Exercises Other Exercises Other Exercises: edu pt and wife re: role of rehab, discharge recommendations, home safety, role of OT in acute care   Shoulder Instructions      Home Living Family/patient expects to be discharged to:: Private residence Living Arrangements: Spouse/significant other Available Help at Discharge: Family Type of Home: House       Home Layout: One level               Home Equipment: Wheelchair - power;Insurance risk surveyor (2 wheels)   Additional Comments: per wife pt utlized RW, pwc as needed      Prior Functioning/Environment                Mobility Comments: Per wife prior to admission at [place], pt was amb with RW, intermittent use of pwc; After prolonged hospitalization pt requiring significant assist for all mobility ADLs Comments: Prior to extended hospital stay by generally MOD I-I in ADL, now requring significant assist for all ADL/IADL        OT Problem List: Decreased strength;Impaired balance (sitting and/or standing);Decreased activity tolerance;Decreased safety awareness;Decreased cognition;Decreased knowledge of precautions;Decreased knowledge of use of DME or AE      OT Treatment/Interventions: Self-care/ADL training;Patient/family education;Therapeutic exercise;Balance training;Energy conservation;Therapeutic activities;DME and/or AE instruction;Cognitive remediation/compensation    OT Goals(Current goals can be found in the care plan section) Acute Rehab OT Goals Patient Stated Goal: rehab OT Goal Formulation: With patient/family Time For Goal Achievement: 07/22/22 Potential to Achieve Goals: Good  OT Frequency: Min 2X/week    Co-evaluation PT/OT/SLP Co-Evaluation/Treatment: Yes Reason for Co-Treatment: Complexity of the patient's impairments (multi-system involvement);Necessary to address cognition/behavior during functional activity;For patient/therapist safety PT goals addressed during session: Mobility/safety with mobility OT goals addressed during session: ADL's and self-care      AM-PAC OT "6 Clicks" Daily Activity  Outcome Measure Help from another person eating meals?: A Lot Help from another person taking care of personal grooming?: A Lot Help from another person toileting, which includes using toliet, bedpan, or urinal?: Total Help from another person bathing (including washing, rinsing, drying)?: Total Help from another person to put on and taking off regular upper body clothing?: A Lot Help from another person to put on and taking off regular lower body clothing?: A Lot 6 Click Score: 10    End of Session Equipment Utilized During Treatment: Oxygen Nurse Communication: Mobility status  Activity Tolerance: Patient tolerated treatment well Patient left: in bed;with call bell/phone within reach;with family/visitor present;with nursing/sitter in room  OT Visit Diagnosis: Unsteadiness on feet (R26.81);Muscle weakness (generalized) (M62.81)                Time: 1410-3013 OT Time Calculation (min): 18 min Charges:  OT General Charges $OT Visit: 1 Visit OT Evaluation $OT Eval Moderate Complexity: 1 Mod  Oleta Mouse, OTD OTR/L  07/08/22, 1:23 PM

## 2022-07-08 NOTE — IPAL (Signed)
  Interdisciplinary Goals of Care Family Meeting   Date carried out: 07/08/2022  Location of the meeting: Phone conference  Member's involved: Physician and Family Member or next of kin  Durable Power of Attorney or acting medical decision maker: wife, Aydeen Blume    Discussion: We discussed goals of care for Carl Figueroa .  Reviewed in detail the patient's clinical condition, major active diagnoses including severe sepsis, frailty and recent surgeries. She elected to keep him a full code at this time. Her hope is to take him home with home health but she may reconsider SNF     Code status: Full Code  Disposition: Continue current acute care  Time spent for the meeting: 25    Andris Baumann, MD  07/08/2022, 12:53 AM

## 2022-07-08 NOTE — ED Notes (Signed)
Pt in bed, pt opens eyes to voice, pt denies pain, pt confused, re oriented pt, pt unable to stay awake for swallow screen, order placed for SLP

## 2022-07-08 NOTE — Progress Notes (Cosign Needed)
Carl Figueroa October 29, 2045  Please be advised that the above-named patient will require a short-term nursing home stay - anticipated 30 days or less for rehabilitation and strengthening.  The plan is for return home.

## 2022-07-08 NOTE — ED Notes (Signed)
[employer]prox 1100 ml of clear yellow urine drained from foley bag, replaced pt's ostomy bag, per pt his bag was leaking, sig other at bedside, pt awake.

## 2022-07-08 NOTE — ED Notes (Signed)
ED TO INPATIENT HANDOFF REPORT  ED Nurse Name and Phone #: Ned Clines Name/Age/Gender Carl Figueroa 77 y.o. male Room/Bed: ED10A/ED10A  Code Status   Code Status: Full Code  Home/SNF/Other Home Patient oriented to: self and place Is this baseline? No   Triage Complete: Triage complete  Chief Complaint Severe sepsis (HCC) [A41.9, R65.20]  Triage Note "History of dementia, had 2 episodes today of starring off in to space today, call came out as possible seizures. Had recent surgery for bowel perforation and has been on antibiotics" per EMS  "He came home from the [facility] yesterday after being in the hospital one month and three days. He had episode of freezing up and not responding last night and then another one today so I called EMS because I thought it could be a seizure from the medication" per spouse.    Allergies Allergies  Allergen Reactions   Morphine Nausea Only    Level of Care/Admitting Diagnosis ED Disposition     ED Disposition  Admit   Condition  --   Comment  Hospital Area: [facility] [[id number]]  Level of Care: Progressive [102]  Admit to Progressive based on following criteria: MULTISYSTEM THREATS such as stable sepsis, metabolic/electrolyte imbalance with or without encephalopathy that is responding to early treatment.  Covid Evaluation: Asymptomatic - no recent exposure (last 10 days) testing not required  Diagnosis: Severe sepsis [facility]) [[id number]]  Admitting Physician: Andris Baumann [[id number]]  Attending Physician: Andris Baumann [[id number]]  Certification:: I certify this patient will need inpatient services for at least 2 midnights  Estimated Length of Stay: 3          B Medical/Surgery History Past Medical History:  Diagnosis Date   Anxiety    Arthritis    Hypertension    Lewy body dementia (HCC)    Past Surgical History:  Procedure Laterality Date   COLONOSCOPY     FOOT SURGERY     X2    HERNIA REPAIR Right    INGUINAL    perforated bowel  05/2022   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   Procedure: TOTAL HIP ARTHROPLASTY ANTERIOR APPROACH;  Surgeon: Kennedy Bucker, MD;  Location: [facility] ORS;  Service: Orthopedics;  Laterality: Left;     A IV Location/Drains/Wounds Patient Lines/Drains/Airways Status     Active Line/Drains/Airways     Name Placement date Placement time Site Days   Peripheral IV 07/07/22 20 G 1" Left Antecubital 07/07/22  1530  Antecubital  1   Peripheral IV 07/07/22 20 G 1" Anterior;Proximal;Right Forearm 07/07/22  1633  Forearm  1   Incision (Closed) 11/03/19 Hip Left 11/03/19  1447  -- 978            Intake/Output Last 24 hours  Intake/Output Summary (Last 24 hours) at 07/08/2022 1351 Last data filed at 07/08/2022 0654 Gross per 24 hour  Intake 90.8 ml  Output 800 ml  Net -709.2 ml    Labs/Imaging Results for orders placed or performed during the hospital encounter of 07/07/22 (from the past 48 hour(s))  Blood gas, venous     Status: Abnormal   Collection Time: 07/07/22  4:00 PM  Result Value Ref Range   Delivery systems ROOM AIR    pH, Ven 7.29 7.25 - 7.43   pCO2, Ven 53 44 - 60 mmHg   pO2, Ven <31 (LL) 32 - 45 mmHg    Comment: CRITICAL RESULT CALLED TO, READ BACK BY AND VERIFIED  WITH: DR Darnelle Catalan AT 1703 ON 07/07/22 BL    Bicarbonate 25.5 20.0 - 28.0 mmol/L   Acid-base deficit 1.9 0.0 - 2.0 mmol/L   O2 Saturation 30.5 %   Patient temperature 37.0    Collection site VENOUS     Comment: Performed at [facility], [address] [place], [place] [address]  Comprehensive metabolic panel     Status: Abnormal   Collection Time: 07/07/22  4:33 PM  Result Value Ref Range   Sodium 143 135 - 145 mmol/L   Potassium 5.8 (H) 3.5 - 5.1 mmol/L    Comment: HEMOLYSIS AT THIS LEVEL MAY AFFECT RESULT   Chloride 111 98 - 111 mmol/L   CO2 22 22 - 32 mmol/L   Glucose, Bld 143 (H) 70 - 99 mg/dL    Comment: Glucose reference range  applies only to samples taken after fasting for at least 8 hours.   BUN 24 (H) 8 - 23 mg/dL   Creatinine, Ser 6.72 (H) 0.61 - 1.24 mg/dL   Calcium 09.4 (H) 8.9 - 10.3 mg/dL   Total Protein 8.7 (H) 6.5 - 8.1 g/dL   Albumin 2.8 (L) 3.5 - 5.0 g/dL   AST 35 15 - 41 U/L    Comment: HEMOLYSIS AT THIS LEVEL MAY AFFECT RESULT   ALT 20 0 - 44 U/L    Comment: HEMOLYSIS AT THIS LEVEL MAY AFFECT RESULT   Alkaline Phosphatase 127 (H) 38 - 126 U/L   Total Bilirubin 0.8 0.3 - 1.2 mg/dL    Comment: HEMOLYSIS AT THIS LEVEL MAY AFFECT RESULT   GFR, Estimated 39 (L) >60 mL/min    Comment: (NOTE) Calculated using the CKD-EPI Creatinine Equation (2021)    Anion gap 10 5 - 15    Comment: Performed at [facility], [address] [place], [place] [address]  Gastrointestinal Panel by PCR , Stool     Status: None   Collection Time: 07/07/22  4:33 PM   Specimen: Stool  Result Value Ref Range   Campylobacter species NOT DETECTED NOT DETECTED   Plesimonas shigelloides NOT DETECTED NOT DETECTED   Salmonella species NOT DETECTED NOT DETECTED   Yersinia enterocolitica NOT DETECTED NOT DETECTED   Vibrio species NOT DETECTED NOT DETECTED   Vibrio cholerae NOT DETECTED NOT DETECTED   Enteroaggregative E coli (EAEC) NOT DETECTED NOT DETECTED   Enteropathogenic E coli (EPEC) NOT DETECTED NOT DETECTED   Enterotoxigenic E coli (ETEC) NOT DETECTED NOT DETECTED   Shiga like toxin producing E coli (STEC) NOT DETECTED NOT DETECTED   Shigella/Enteroinvasive E coli (EIEC) NOT DETECTED NOT DETECTED   Cryptosporidium NOT DETECTED NOT DETECTED   Cyclospora cayetanensis NOT DETECTED NOT DETECTED   Entamoeba histolytica NOT DETECTED NOT DETECTED   Giardia lamblia NOT DETECTED NOT DETECTED   Adenovirus F40/41 NOT DETECTED NOT DETECTED   Astrovirus NOT DETECTED NOT DETECTED   Norovirus GI/GII NOT DETECTED NOT DETECTED   Rotavirus A NOT DETECTED NOT DETECTED   Sapovirus (I, II, IV, and V) NOT DETECTED NOT DETECTED     Comment: Performed at [facility], [address] [place], [place] [address]  C Difficile Quick Screen w PCR reflex     Status: None   Collection Time: 07/07/22  4:33 PM   Specimen: Stool  Result Value Ref Range   C Diff antigen NEGATIVE NEGATIVE   C Diff toxin NEGATIVE NEGATIVE   C Diff interpretation No C. difficile detected.     Comment: Performed at  [facility], [address], [place], [place] [address]  Troponin I (High Sensitivity)     Status: Abnormal   Collection Time: 07/07/22  4:33 PM  Result Value Ref Range   Troponin I (High Sensitivity) 57 (H) <18 ng/L    Comment: (NOTE) Elevated high sensitivity troponin I (hsTnI) values and significant  changes across serial measurements may suggest ACS but many other  chronic and acute conditions are known to elevate hsTnI results.  Refer to the "Links" section for chest pain algorithms and additional  guidance. Performed at [facility], [address] [place], [place] [address]   CBC with Differential     Status: Abnormal   Collection Time: 07/07/22  4:33 PM  Result Value Ref Range   WBC 15.4 (H) 4.0 - 10.5 K/uL   RBC 3.38 (L) 4.22 - 5.81 MIL/uL   Hemoglobin 9.4 (L) 13.0 - 17.0 g/dL   HCT 63.8 (L) 93.7 - 34.2 %   MCV 86.7 80.0 - 100.0 fL   MCH 27.8 26.0 - 34.0 pg   MCHC 32.1 30.0 - 36.0 g/dL   RDW 87.6 81.1 - 57.2 %   Platelets 403 (H) 150 - 400 K/uL   nRBC 0.0 0.0 - 0.2 %   Neutrophils Relative % 87 %   Neutro Abs 13.4 (H) 1.7 - 7.7 K/uL   Lymphocytes Relative 6 %   Lymphs Abs 0.9 0.7 - 4.0 K/uL   Monocytes Relative 7 %   Monocytes Absolute 1.1 (H) 0.1 - 1.0 K/uL   Eosinophils Relative 0 %   Eosinophils Absolute 0.0 0.0 - 0.5 K/uL   Basophils Relative 0 %   Basophils Absolute 0.0 0.0 - 0.1 K/uL   Immature Granulocytes 0 %   Abs Immature Granulocytes 0.06 0.00 - 0.07 K/uL    Comment: Performed at [facility], [address] [place], [place] [address]  Urinalysis,  Routine w reflex microscopic     Status: Abnormal   Collection Time: 07/07/22  4:33 PM  Result Value Ref Range   Color, Urine YELLOW (A) YELLOW   APPearance CLOUDY (A) CLEAR   Specific Gravity, Urine 1.017 1.005 - 1.030   pH 5.0 5.0 - 8.0   Glucose, UA NEGATIVE NEGATIVE mg/dL   Hgb urine dipstick SMALL (A) NEGATIVE   Bilirubin Urine NEGATIVE NEGATIVE   Ketones, ur NEGATIVE NEGATIVE mg/dL   Protein, ur 597 (A) NEGATIVE mg/dL   Nitrite NEGATIVE NEGATIVE   Leukocytes,Ua TRACE (A) NEGATIVE   RBC / HPF 0-5 0 - 5 RBC/hpf   WBC, UA 11-20 0 - 5 WBC/hpf   Bacteria, UA FEW (A) NONE SEEN   Squamous Epithelial / LPF NONE SEEN 0 - 5   Mucus PRESENT    Hyaline Casts, UA PRESENT     Comment: Performed at [facility], [address] [place], [place] [address]  Lactic acid, plasma     Status: Abnormal   Collection Time: 07/07/22  4:33 PM  Result Value Ref Range   Lactic Acid, Venous 4.3 (HH) 0.5 - 1.9 mmol/L    Comment: CRITICAL RESULT CALLED TO, READ BACK BY AND VERIFIED WITH TIFFANY SOUTHARD AT 1732 07/07/22.PMF Performed at [facility], [address] [place], [place] [address]   Culture, blood (single)     Status: None (Preliminary result)   Collection Time: 07/07/22  6:34 PM   Specimen: BLOOD RIGHT ARM  Result Value Ref Range   Specimen Description BLOOD RIGHT ARM  Special Requests      BOTTLES DRAWN AEROBIC AND ANAEROBIC Blood Culture results may not be optimal due to an inadequate volume of blood received in culture bottles   Culture      NO GROWTH < 12 HOURS Performed at [facility], [address] [place], [place] [address]    Report Status PENDING   Lactic acid, plasma     Status: Abnormal   Collection Time: 07/07/22  6:45 PM  Result Value Ref Range   Lactic Acid, Venous 3.5 (HH) 0.5 - 1.9 mmol/L    Comment: CRITICAL VALUE NOTED. VALUE IS CONSISTENT WITH PREVIOUSLY REPORTED/CALLED VALUE DLB Performed at [facility], [address] [place], [place] [address]   Troponin I (High Sensitivity)     Status: Abnormal   Collection Time: 07/07/22  6:45 PM  Result Value Ref Range   Troponin I (High Sensitivity) 64 (H) <18 ng/L    Comment: (NOTE) Elevated high sensitivity troponin I (hsTnI) values and significant  changes across serial measurements may suggest ACS but many other  chronic and acute conditions are known to elevate hsTnI results.  Refer to the "Links" section for chest pain algorithms and additional  guidance. Performed at [facility], [address] [place], [place] [address]   Lipid panel     Status: Abnormal   Collection Time: 07/08/22  3:19 AM  Result Value Ref Range   Cholesterol 139 0 - 200 mg/dL   Triglycerides 93 <213 mg/dL   HDL 34 (L) >08 mg/dL   Total CHOL/HDL Ratio 4.1 RATIO   VLDL 19 0 - 40 mg/dL   LDL Cholesterol 86 0 - 99 mg/dL    Comment:        Total Cholesterol/HDL:CHD Risk Coronary Heart Disease Risk Table                     Men   Women  1/2 Average Risk   3.4   3.3  Average Risk       5.0   4.4  2 X Average Risk   9.6   7.1  3 X Average Risk  23.4   11.0        Use the calculated Patient Ratio above and the CHD Risk Table to determine the patient's CHD Risk.        ATP III CLASSIFICATION (LDL):  <100     mg/dL   Optimal  657-846  mg/dL   Near or Above                    Optimal  130-159  mg/dL   Borderline  962-952  mg/dL   High  >841     mg/dL   Very High Performed at [facility], [address] [place], [place] [address]   CBC     Status: Abnormal   Collection Time: 07/08/22  3:19 AM  Result Value Ref Range   WBC 12.4 (H) 4.0 - 10.5 K/uL   RBC 2.71 (L) 4.22 - 5.81 MIL/uL   Hemoglobin 7.5 (L) 13.0 - 17.0 g/dL   HCT 10.2 (L) 72.5 - 36.6 %   MCV 86.0 80.0 - 100.0 fL   MCH 27.7 26.0 - 34.0 pg   MCHC 32.2 30.0 - 36.0 g/dL   RDW 44.0 34.7 - 42.5 %   Platelets 302 150 - 400 K/uL   nRBC 0.0 0.0 - 0.2 %    Comment: Performed at [employer]  [facility], [address] [place], [place] [address]  Basic metabolic panel     Status: Abnormal   Collection Time: 07/08/22  3:19 AM  Result Value Ref Range   Sodium 144 135 - 145 mmol/L   Potassium 5.5 (H) 3.5 - 5.1 mmol/L   Chloride 115 (H) 98 - 111 mmol/L   CO2 25 22 - 32 mmol/L   Glucose, Bld 104 (H) 70 - 99 mg/dL    Comment: Glucose reference range applies only to samples taken after fasting for at least 8 hours.   BUN 23 8 - 23 mg/dL   Creatinine, Ser 5.45 (H) 0.61 - 1.24 mg/dL   Calcium 62.5 8.9 - 63.8 mg/dL   GFR, Estimated 47 (L) >60 mL/min    Comment: (NOTE) Calculated using the CKD-EPI Creatinine Equation (2021)    Anion gap 4 (L) 5 - 15    Comment: Performed at [facility], [address], [place], [place] [address]  Potassium     Status: None   Collection Time: 07/08/22  8:40 AM  Result Value Ref Range   Potassium 4.8 3.5 - 5.1 mmol/L    Comment: Performed at [facility], [address], [place], [place] [address]  Potassium     Status: None   Collection Time: 07/08/22 11:31 AM  Result Value Ref Range   Potassium 4.7 3.5 - 5.1 mmol/L    Comment: Performed at [facility], [address] [place], [place] [address]   US Carotid Bilateral (at [facility] and [facility] only)  Result Date: 07/08/2022 CLINICAL DATA:  Dementia EXAM: BILATERAL CAROTID DUPLEX ULTRASOUND TECHNIQUE: Wallace Cullens scale imaging, color Doppler and duplex ultrasound were performed of bilateral carotid and vertebral arteries in the neck. COMPARISON:  None Available. FINDINGS: Criteria: Quantification of carotid stenosis is based on velocity parameters that correlate the residual internal carotid diameter with NASCET-based stenosis levels, using the diameter of the distal internal carotid lumen as the denominator for stenosis measurement. The following velocity measurements were obtained: RIGHT ICA: 124 cm/sec CCA: 80 cm/sec SYSTOLIC ICA/CCA RATIO:  1.5 ECA: 93 cm/sec LEFT ICA: 91 cm/sec  CCA: 83 cm/sec SYSTOLIC ICA/CCA RATIO:  1.1 ECA: 87 cm/sec RIGHT CAROTID ARTERY: Patent without plaque or intimal thickening RIGHT VERTEBRAL ARTERY:  Patent with antegrade flow LEFT CAROTID ARTERY:  Patent without plaque or intimal thickening LEFT VERTEBRAL ARTERY:  Patent with antegrade flow IMPRESSION: 1. Negative study. No carotid stenosis identified in the neck by Doppler criteria. 2. The vertebral arteries are patent with antegrade flow. Electronically Signed   By: Agustin Cree M.D.   On: 07/08/2022 09:17   CT CHEST ABDOMEN PELVIS W CONTRAST  Result Date: 07/07/2022 CLINICAL DATA:  Recent surgery for perforated bowel at some point last month, presents with acute abdominal pain with leukocytosis. EXAM: CT CHEST, ABDOMEN, AND PELVIS WITH CONTRAST TECHNIQUE: Multidetector CT imaging of the chest, abdomen and pelvis was performed following the standard protocol during bolus administration of intravenous contrast. RADIATION DOSE REDUCTION: This exam was performed according to the departmental dose-optimization program which includes automated exposure control, adjustment of the mA and/or kV according to patient size and/or use of iterative reconstruction technique. CONTRAST:  77mL OMNIPAQUE IOHEXOL 300 MG/ML  SOLN COMPARISON:  Portable chest today, chest radiograph 07/06/2019. No prior chest CT. Comparison CT abdomen and pelvis without contrast is available dated 07/06/2019. FINDINGS: CT CHEST FINDINGS Cardiovascular: The cardiac size is normal. There is no pericardial effusion. There are scattered calcific plaques in the LAD  coronary artery. There are scattered calcifications of the aortic valve leaflets, mild calcific plaques in the aortic arch and subclavian arteries. There is no aortic or great vessel aneurysm, dissection or stenosis. The pulmonary arteries and veins are normal caliber. The pulmonary arteries are centrally clear but not evaluated distal to the hila. Mediastinum/Nodes: No enlarged mediastinal,  hilar, or axillary lymph nodes. Thyroid gland, trachea, and esophagus demonstrate no significant findings. Lungs/Pleura: Eventration and asymmetric elevation noted left hemidiaphragm. There is patchy airspace disease in the left lower lobe consistent with pneumonia or aspiration with small layering parapneumonic pleural effusion without evidence of empyema. There is no right pleural effusion. No pneumothorax or pleural thickening. Respiratory motion is noted on exam. Central airways and remaining lungs are clear, as visualized. Musculoskeletal: Moderate bilateral shoulder DJD. Mild dextroscoliosis of advanced marginal osteophytosis thoracic spine. 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 No acute or other significant osseous findings. No chest wall mass is seen. CT ABDOMEN PELVIS FINDINGS Technical note: Limited image resolution in the abdomen and pelvis due to patient motion, breathing motion, and streak artifact from the patient's arms in the field and overlying wires. Hepatobiliary: No obvious abnormality. There are tiny stones in the gallbladder proximally but no wall thickening. The bile ducts are normal caliber. Pancreas: No solid mass enhancement. There is a 2 cm homogeneous rounded cystic lesion at the junction of the body and tail of the pancreas, posterior aspect, Hounsfield density of 7 units (see series 2, axial images 54-56). This could be a pseudocyst, serous or mucinous cystadenoma or side branch IPMN. Other etiologies such as malignancy are possible especially given the motion and streak artifacts through the area. MRI without and with contrast recommended when clinically feasible. There is no pancreatic ductal dilatation or other focal abnormality. Spleen: Not well seen but no obvious abnormality.  No splenomegaly. Adrenals/Urinary Tract: There is no adrenal mass. There is a 1 cm homogeneous  cyst in the posterior aspect of the left kidney, Hounsfield density of 7 units. There are a few additional bilateral scattered cortical hypodensities which are too small to characterize. No follow-up imaging is recommended. For reference see JACR 2018 Feb; 264-273, Management of the Incidental Renal Mass on CT, [employer] 2021; 814-848, Bosniak Classification of Cystic Renal Masses, Version 2019. There is bilateral mild renal cortical thinning. There are punctate nonobstructive caliceal stones posteriorly in left kidney. No nephrolithiasis is seen on the right. No hydronephrosis or ureteral stones. The bladder is catheterized but despite this is still distended with air and fluid. Clinical correlation recommended for catheter dysfunction. There is mild bladder thickening versus underdistention. Stomach/Bowel: The patient appears to have undergone a near total colectomy. The rectum remains and is sutured closed in the presacral space, with mild rectal wall thickening and perirectal stranding, the latter which could be congestive or related to fluid overload or proctitis. 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 A pigtail catheter is noted entering the lateral right mid abdominal wall with the pigtail within a right mid abdominal small bowel segment. There are a few slightly dilated left mid abdominal small bowel segments, maximum caliber 2.9 cm, unknown if this is due to partial small bowel obstruction or ileus but there is no bowel dilatation elsewhere in the abdomen or pelvis. A transitional segment could not be  found. Vascular/Lymphatic: There is diffuse mesenteric haziness. Unclear whether related to fluid overload, congestive, malnutrition or hepatic dysfunction. The abdominal aorta is unremarkable. There are patchy calcific plaques in the common iliac and internal iliac  arteries. The portal vein is normal caliber and patent. Accounting for patient motion no enlarged lymph nodes are seen throughout. Reproductive: The prostate is not well seen due to streak artifact from left hip replacement, but transversely it measures enlarged at 5.4 cm. Seminal vesicles are normal in size. Both testicles are in the scrotal sac and there are small scrotal hydroceles. Other: Minimal abdominal and pelvic ascites. There is no free air, abscess or free hemorrhage. Presence of diffuse mesenteric edema limits assessment for focal inflammatory change. There has been interval weight loss since 07/06/2019 with reduction in the intra-abdominal and subcutaneous body fat stores. There is mild body wall anasarca. Musculoskeletal: Left THA. Moderately advanced right hip DJD. There is dextroscoliosis and advanced degenerative change of the lumbar spine. Advanced facet hypertrophy L3-4 down is noted with degenerative grade 1 anterolisthesis at L4-5 and L5-S1 and severe acquired foraminal and spinal canal stenosis at both levels. 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 There is no destructive bone lesion. IMPRESSION: 1. Left lower lobe pneumonia or aspiration with small layering parapneumonic effusion. 2. Aortic and coronary artery atherosclerosis. No thoracic or abdominal aortic aneurysm. 3. Aortic valve leaflet calcifications. Echocardiography may be helpful to assess for aortic stenosis. 4. Diffuse mesenteric haziness/edema. Unclear whether this is related to fluid overload, congestive, malnutrition or hepatic dysfunction. Only minimal ascites. No free air. 5. Cholelithiasis without evidence of cholecystitis. 6. Nonobstructive micronephrolithiasis. 7. 2 cm cystic lesion at the junction of the body and tail of the pancreas. MRI without and with contrast recommended when clinically feasible. Benign and malignant etiologies are possible. 8. 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  No parastomal hernia. 9. Slightly dilated left mid abdominal small bowel segments, unknown if this is due to partial SBO or ileus. 10. The rectum remains, and there is rectal thickening and surrounding stranding which could be congestive or due to proctitis. 11. Thickened stomach and scattered mid to lower abdominal small bowel segments which could be due to gastroenteritis or congestive. 12. Bladder catheterized but despite this is still distended with air and fluid. Clinical correlation recommended for catheter dysfunction. Cystitis versus bladder nondistention. 13. Prostatomegaly. 14. 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 15. Remaining findings described above. Electronically Signed   By: Almira BarKeith  Chesser M.D.   On: 07/07/2022 20:41   CT Head Wo Contrast  Result Date: 07/07/2022 CLINICAL DATA:  Mental status change. EXAM: CT HEAD WITHOUT CONTRAST TECHNIQUE: Contiguous axial images were obtained from the base of the skull through the vertex without intravenous contrast. RADIATION DOSE REDUCTION: This exam was performed according to the departmental dose-optimization program which includes automated exposure control, adjustment of the mA and/or kV according to patient size and/or use of iterative reconstruction technique. COMPARISON:  Jan 11, 2022 CT scan the brain FINDINGS: Brain: Ventricles and sulci are prominent but stable. No mass effect or midline shift. No cyst subdural, epidural, or subarachnoid hemorrhage. Cerebellum, brainstem, and basal cisterns are normal. Scattered white matter changes are identified. No acute cortical ischemia or infarct. Vascular: No hyperdense vessel or unexpected calcification. Skull: Normal. Negative for fracture or focal lesion. Sinuses/Orbits: No acute finding. Other: None. IMPRESSION: Chronic white matter changes. No acute  intracranial abnormalities. Electronically Signed   By: Gerome Sam III M.D.   On: 07/07/2022 17:18   DG Chest Portable 1 View  Result Date: 07/07/2022 CLINICAL DATA:  Shortness of breath and tachycardia. EXAM: PORTABLE CHEST 1 VIEW COMPARISON:  07/06/2019 FINDINGS: The cardiomediastinal silhouette is unremarkable. Elevation of the LEFT hemidiaphragm with mild LEFT basilar atelectasis/scarring again noted. There is no evidence of focal airspace disease, pulmonary edema, suspicious pulmonary nodule/mass, pleural effusion, or pneumothorax. No acute bony abnormalities are identified. IMPRESSION: No active disease. Electronically Signed   By: Harmon Pier M.D.   On: 07/07/2022 16:37    Pending Labs Unresulted Labs (From admission, onward)     Start     Ordered   07/14/22 0500  Creatinine, serum  (enoxaparin (LOVENOX)    CrCl >/= 30 ml/min)  Weekly,   R     Comments: while on enoxaparin therapy    07/07/22 2224   07/08/22 1200  MRSA Next Gen by PCR, Nasal  (MRSA Screening)  ONCE - URGENT,   R        07/08/22 1159   07/08/22 0500  Hemoglobin A1c  Once,   R        07/08/22 0500   07/07/22 2228  Potassium  STAT Now then every 4 hours ,   R     Comments: Until normal twice.    07/07/22 2228            Vitals/Pain Today's Vitals   07/08/22 1015 07/08/22 1100 07/08/22 1226 07/08/22 1338  BP: 124/63 112/85 (!) 143/67 109/77  Pulse: 77 79 78 80  Resp: Temp:   98.3 F (36.8 C) 98.3 F (36.8 C)  TempSrc:   Oral Oral  SpO2: 100% 100% 100% 100%  Weight:      Height:      PainSc:   7  0-No pain    Isolation Precautions Enteric precautions (UV disinfection)  Medications Medications  lactated ringers infusion ( Intravenous New Bag/Given 07/08/22 1001)  Oral care mouth rinse (15 mLs Mouth Rinse Given 07/08/22 1344)  Oral care mouth rinse (has no administration in time range)   stroke: early stages of recovery book ( Does not apply Not Given 07/08/22 1002)  LORazepam (ATIVAN) injection 2 mg (has no administration in time range)   acetaminophen (TYLENOL) tablet 650 mg (has no administration in time range)    Or  acetaminophen (TYLENOL) suppository 650 mg (has no administration in time range)  ondansetron (ZOFRAN) tablet 4 mg (has no administration in time range)    Or  ondansetron (ZOFRAN) injection 4 mg (has no administration in time range)  enoxaparin (LOVENOX) injection 40 mg (has no administration in time range)  Ampicillin-Sulbactam (UNASYN) 3 g in sodium chloride 0.9 % 100 mL IVPB (3 g Intravenous New Bag/Given 07/08/22 1341)  vancomycin (VANCOREADY) IVPB 750 mg/150 mL (has no administration in time range)  lactated ringers bolus 1,000 mL (0 mLs Intravenous Stopped 07/07/22 2058)    And  lactated ringers bolus 1,000 mL (0 mLs Intravenous Stopped 07/07/22 2237)    And  lactated ringers bolus 250 mL (0 mLs Intravenous Stopped 07/07/22 2244)  ceFEPIme (MAXIPIME) 2 g in sodium chloride 0.9 % 100 mL IVPB (0 g Intravenous Stopped 07/07/22 2033)  metroNIDAZOLE (FLAGYL) IVPB 500 mg (0 mg Intravenous Stopped 07/07/22 2057)  vancomycin (VANCOREADY) IVPB 1250 mg/250 mL (0 mg Intravenous Stopped 07/07/22 2237)  iohexol (OMNIPAQUE) 300 MG/ML solution 80 mL (80  mLs Intravenous Contrast Given 07/07/22 1940)  lactated ringers bolus 500 mL (0 mLs Intravenous Stopped 07/08/22 1610)    Mobility walks with device High fall risk    R Recommendations: See Admitting Provider Note  Report given to:   Additional Notes:

## 2022-07-08 NOTE — ED Notes (Signed)
Advised nurse that patient has assigned bed 

## 2022-07-08 NOTE — Evaluation (Signed)
Clinical/Bedside Swallow Evaluation Patient Details  Name: Carl Figueroa MRN: [id number] Date of Birth: 1946/04/16  Today's Date: 07/08/2022 Time: SLP Start Time (ACUTE ONLY): 1115 SLP Stop Time (ACUTE ONLY): 1145 SLP Time Calculation (min) (ACUTE ONLY): 30 min  Past Medical History:  Past Medical History:  Diagnosis Date   Anxiety    Arthritis    Hypertension    Lewy body dementia (HCC)    Past Surgical History:  Past Surgical History:  Procedure Laterality Date   COLONOSCOPY     FOOT SURGERY     X2   HERNIA REPAIR Right    INGUINAL    perforated bowel  05/2022   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   Procedure: TOTAL HIP ARTHROPLASTY ANTERIOR APPROACH;  Surgeon: Kennedy Bucker, MD;  Location: [facility] ORS;  Service: Orthopedics;  Laterality: Left;   HPI: Per H&P ": Carl Figueroa is a 76 y.o. male with medical history significant for Hypertension, dementia, CKD 3a, discharged 11/17 from a 1 month admission at the [facility] for appendicitis and intra-abdominal abscesses where he underwent laparotomy with  colectomy and ileostomy placement and discharged with Foley catheter and Augmentin and Zyvox until 12/17(discharge summary unavailable), who was brought to the ED for staring episode x2 since arrival home, once on the day of discharge on 11/17 and another on 11/18.  He was unresponsive to his name during the episode.  Wife was concerned about a seizure and called EMS.  Patient is intermittently somnolent and unable to contribute to history.  When awakened he denies complaints. Wife admits that she declined SNF placement and wanted him to to go home with home health because she did not trust that the facilities would provide the care he needs or get out of bed. " Head CT 07/07/22 "Chronic white matter changes. No acute intracranial abnormalities. " CT chest abdomen pelvis 07/07/22 "1. Left lower lobe pneumonia or aspiration with small layering parapneumonic effusion. 2. Aortic and  coronary artery atherosclerosis. No thoracic or abdominal aortic aneurysm. 3. Aortic valve leaflet calcifications. Echocardiography may be helpful to assess for aortic stenosis. 4. Diffuse mesenteric haziness/edema. Unclear whether this is related to fluid overload, congestive, malnutrition or hepatic dysfunction. Only minimal ascites. No free air. 5. Cholelithiasis without evidence of cholecystitis. 6. Nonobstructive micronephrolithiasis. 7. 2 cm cystic lesion at the junction of the body and tail of the pancreas. MRI without and with contrast recommended when clinically feasible. Benign and malignant etiologies are possible. 8. 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 No parastomal hernia. 9. Slightly dilated left mid abdominal small bowel segments, unknown if this is due to partial SBO or ileus. 10. The rectum remains, and there is rectal thickening and surrounding stranding which could be congestive or due to proctitis. 11. Thickened stomach and scattered mid to lower abdominal small bowel segments which could be due to gastroenteritis or congestive. 12. Bladder catheterized but despite this is still distended with air and fluid. Clinical correlation recommended for catheter dysfunction. Cystitis versus bladder nondistention. 13. Prostatomegaly. 14. 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 15. Remaining findings described above."  Per H&P ": Carl Figueroa is a 76 y.o. male with medical history significant for Hypertension, dementia, CKD 3a, discharged 11/17 from a 1 month admission at the [facility] for appendicitis and intra-abdominal abscesses where he underwent laparotomy with  colectomy and ileostomy placement and discharged with Foley catheter and Augmentin  and Zyvox until 12/17(discharge summary unavailable), who was brought to the ED for staring episode x2 since  arrival home, once on the day of discharge on 11/17 and another on 11/18.  He was unresponsive to his name during the episode.  Wife was concerned about a seizure and called EMS.  Patient is intermittently somnolent and unable to contribute to history.  When awakened he denies complaints. Wife admits that she declined SNF placement and wanted him to to go home with home health because she did not trust that the facilities would provide the care he needs or get out of bed."    Assessment / Plan / Recommendation  Clinical Impression  Pt seen for clinical swallowing evaluation. Pt initially lethargic. LOA improved during evaluation. Wife at bedside. Per wife, pt consumed a regular diet with thin liquids PTA. Wife acknowledged recent MBSS at [place] which, per wife, recommended a regular diet with thin liquids.  Pt on 2L/min O2 via [place]. Weak, hoarse vocal quality appreciated. Baseline per wife. Cleared with RN.   Oral motor exam completed and most significant for xerostomia, lingual tremor with volitional movement, and missing dentition.   Pt given trials of solid, pureed, thin (via straw), and ice chips. Pt required total assistance for feeding which is baseline per wife. Pt with s/sx mild oral dysphagia c/b mildly prolonged mastication of solids which is likely due to dental status and overall deconditioned state. Pharyngeal swallow appeared [facility] per clinical assessment. No overt s/sx pharyngeal dysphagia. To palpation, pt with seemingly timely swallow initiation and seemingly adequate laryngeal elevation. Mild improvement in vocal quality noted across trials.   Recommend cautious initiation of a mech soft diet with chopped meats and thin liquids with safe swallowing strategies/aspiration precautions as outlined below.   Pt is at increased risk for aspiration/aspiration PNA given mental status/cognitive status, medical comorbidities, and dependence for feeding.   SLP to f/u per POC for diet tolerance.   Pt,  pt's wife, and RN made aware of results, recommendations, and SLP POC.    SLP Visit Diagnosis: Dysphagia, oral phase (R13.11)    Aspiration Risk  Mild aspiration risk    Diet Recommendation Dysphagia 3 (Mech soft);Thin liquid   Medication Administration:  (whole vs crushed in puree) Supervision: Full supervision/cueing for compensatory strategies Compensations: Minimize environmental distractions;Slow rate;Small sips/bites Postural Changes: Seated upright at 90 degrees;Remain upright for at least 30 minutes after po intake    Other  Recommendations Oral Care Recommendations: Oral care QID;Staff/trained caregiver to provide oral care    Recommendations for follow up therapy are one component of a multi-disciplinary discharge planning process, led by the attending physician.  Recommendations may be updated based on patient status, additional functional criteria and insurance authorization.  Follow up Recommendations  (TBD)         Functional Status Assessment Patient has had a recent decline in their functional status and/or demonstrates limited ability to make significant improvements in function in a reasonable and predictable amount of time  Frequency and Duration min 2x/week  2 weeks       Prognosis Prognosis for Safe Diet Advancement: Fair Barriers to Reach Goals: Severity of deficits (comorbidities)      Swallow Study   General Date of Onset: 07/07/22 HPI: Per H&P ": Carl Figueroa is a 76 y.o. male with medical history significant for Hypertension, dementia, CKD 3a, discharged 11/17 from a 1 month admission at the [facility] for appendicitis and intra-abdominal abscesses where he underwent laparotomy with  colectomy and ileostomy placement and discharged with Foley catheter and Augmentin and Zyvox until 12/17(discharge summary unavailable), who was brought to the ED for staring episode x2 since arrival home, once on the day of discharge on 11/17 and another on 11/18.  He was  unresponsive to his name during the episode.  Wife was concerned about a seizure and called EMS.  Patient is intermittently somnolent and unable to contribute to history.  When awakened he denies complaints. Wife admits that she declined SNF placement and wanted him to to go home with home health because she did not trust that the facilities would provide the care he needs or get out of bed." Type of Study: Bedside Swallow Evaluation Previous Swallow Assessment: MBSS at [facility] 06/2022 with recommendation for regular diet with thin liquids per wife Diet Prior to this Study: NPO Behavior/Cognition: Lethargic/Drowsy;Requires cueing;Pleasant mood Oral Cavity Assessment: Dry Oral Care Completed by SLP: Yes Oral Cavity - Dentition: Missing dentition (some missing teeth; had a partial which had been misplaced PTA per wife) Self-Feeding Abilities: Total assist Patient Positioning: Upright in bed Baseline Vocal Quality: Hoarse;Low vocal intensity (baseline per wife) Volitional Cough: Weak    Oral/Motor/Sensory Function Overall Oral Motor/Sensory Function: Moderate impairment Facial ROM: Within Functional Limits Lingual ROM: Reduced right;Reduced left (tremor with protrusion and lateralization)   Ice Chips Ice chips: Within functional limits Presentation: Spoon   Thin Liquid Thin Liquid: Within functional limits Presentation: Straw Other Comments: ~4 oz; single and sequential sips          Puree Puree: Within functional limits Presentation: Spoon Other Comments: ~2 oz   Solid     Solid: Impaired Oral Phase Impairments: Impaired mastication Oral Phase Functional Implications: Impaired mastication     Clyde Canterbury, M.S., CCC-SLP Speech-Language Pathologist [facility] [phone] (ASCOM)   Woodroe Chen 07/08/2022,12:22 PM

## 2022-07-08 NOTE — ED Notes (Addendum)
Pt does not follow commands well. He will bite on thermometer and not unclinch his arms. Pt will not hold arms out or lift legs when doing NIHSS.  Pt is confused, unable to complete swallow screen.

## 2022-07-08 NOTE — ED Notes (Signed)
Pt in bed, pt has some bloody drainage in his jp drain, pt has some clear yellow urine in foley bag, appears to be draining appropriately, pt in bed with eyes closed, resps even and unlabored

## 2022-07-08 NOTE — Progress Notes (Signed)
PHARMACY NOTE:  ANTIMICROBIAL RENAL DOSAGE ADJUSTMENT  Current antimicrobial regimen includes a mismatch between antimicrobial dosage and estimated renal function.  As per policy approved by the Pharmacy & Therapeutics and Medical Executive Committees, the antimicrobial dosage will be adjusted accordingly.  Current antimicrobial dosage: Unasyn 3 g IV q8h  Indication: Aspiration pneumonia  Renal Function:  Estimated Creatinine Clearance: 38.7 mL/min (A) (by C-G formula based on SCr of 1.52 mg/dL (H)).    Antimicrobial dosage has been changed to:  Unasyn 3 g IV q6h  Thank you for allowing pharmacy to be a part of this patient's care.  Tressie Ellis, [facility] 07/08/2022 2:22 PM

## 2022-07-09 DIAGNOSIS — R6520 Severe sepsis without septic shock: Secondary | ICD-10-CM | POA: Diagnosis not present

## 2022-07-09 DIAGNOSIS — A419 Sepsis, unspecified organism: Secondary | ICD-10-CM | POA: Diagnosis not present

## 2022-07-09 LAB — POTASSIUM: Potassium: 4.5 mmol/L (ref 3.5–5.1)

## 2022-07-09 LAB — CBC
HCT: 25.4 % — ABNORMAL LOW (ref 39.0–52.0)
Hemoglobin: 8.3 g/dL — ABNORMAL LOW (ref 13.0–17.0)
MCH: 28.1 pg (ref 26.0–34.0)
MCHC: 32.7 g/dL (ref 30.0–36.0)
MCV: 86.1 fL (ref 80.0–100.0)
Platelets: 261 10*3/uL (ref 150–400)
RBC: 2.95 MIL/uL — ABNORMAL LOW (ref 4.22–5.81)
RDW: 14.8 % (ref 11.5–15.5)
WBC: 11.5 10*3/uL — ABNORMAL HIGH (ref 4.0–10.5)
nRBC: 0 % (ref 0.0–0.2)

## 2022-07-09 LAB — BASIC METABOLIC PANEL
Anion gap: 5 (ref 5–15)
BUN: 20 mg/dL (ref 8–23)
CO2: 26 mmol/L (ref 22–32)
Calcium: 10.1 mg/dL (ref 8.9–10.3)
Chloride: 114 mmol/L — ABNORMAL HIGH (ref 98–111)
Creatinine, Ser: 1.4 mg/dL — ABNORMAL HIGH (ref 0.61–1.24)
GFR, Estimated: 52 mL/min — ABNORMAL LOW (ref >60)
Glucose, Bld: 79 mg/dL (ref 70–99)
Potassium: 4.7 mmol/L (ref 3.5–5.1)
Sodium: 145 mmol/L (ref 135–145)

## 2022-07-09 LAB — LACTIC ACID, PLASMA: Lactic Acid, Venous: 1 mmol/L (ref 0.5–1.9)

## 2022-07-09 LAB — MRSA NEXT GEN BY PCR, NASAL: MRSA by PCR Next Gen: NOT DETECTED

## 2022-07-09 LAB — HEMOGLOBIN A1C
Hgb A1c MFr Bld: 5.8 % — ABNORMAL HIGH (ref 4.8–5.6)
Mean Plasma Glucose: 120 mg/dL

## 2022-07-09 MED ORDER — VANCOMYCIN HCL IN DEXTROSE 1-5 GM/200ML-% IV SOLN
1000.0000 mg | INTRAVENOUS | Status: AC
  Administered 2022-07-09 – 2022-07-10 (×2): 1000 mg via INTRAVENOUS
  Filled 2022-07-09 (×2): qty 200

## 2022-07-09 NOTE — Progress Notes (Signed)
Occupational Therapy Treatment Patient Details Name: Carl Figueroa MRN: [id number] DOB: [birth year] Today's Date: 07/09/2022   History of present illness Pt is a 76 y/o M admitted on 07/07/22. Pt's wife reports pt had 2 staring episodes following d/c home from [place] where pt was unresponsive to his name during the episode & wife was concerned about a seizure. Pt is being treated for LLL PNA & severe sepsis. PMH: HTN, lewy body dementia, CKD3A, 1 month admission at the [place] with d/c on 07/06/22 following tx for appendicitis & intraabdominal abscesses where he underwent laparotomy with colectomy & ileostomy placement, anxiety   OT comments  Chart reviewed, pt greeted in bed agreeable to OT tx session. Pt is oriented to self and grossly to place on this date. Improved interaction with this therapist as compared to evaluation. Tx session targeted improving functional activity tolerance, ADL participation. Improvements noted in bed mobility requiring MAX A +1, seated on edge of bed for approx 10 minutes with MOD-MAX A for static sitting balance. MOD-MAX A required for grooming tasks. Pt is left as received, all needs met. Progress continues to be made towards goals, discharge recommendation remains appropriate.    Recommendations for follow up therapy are one component of a multi-disciplinary discharge planning process, led by the attending physician.  Recommendations may be updated based on patient status, additional functional criteria and insurance authorization.    Follow Up Recommendations  Skilled nursing-short term rehab (<3 hours/day)     Assistance Recommended at Discharge Frequent or constant Supervision/Assistance  Patient can return home with the following  Two people to help with walking and/or transfers;Two people to help with bathing/dressing/bathroom   Equipment Recommendations  Other (comment) (defer)    Recommendations for Other Services      Precautions / Restrictions  Precautions Precautions: Fall Precaution Comments: ostomy bag, JP drain Restrictions Weight Bearing Restrictions: No       Mobility Bed Mobility Overal bed mobility: Needs Assistance Bed Mobility: Supine to Sit, Sit to Supine     Supine to sit: Max assist Sit to supine: Max assist   General bed mobility comments: improvements noted as evidenced by pt transitioning legs towards edge of bed for supine>sit    Transfers                         Balance Overall balance assessment: Needs assistance Sitting-balance support: Feet unsupported, Bilateral upper extremity supported Sitting balance-Leahy Scale: Poor Sitting balance - Comments: MOD-MAX A throughout for static sitting balance                                   ADL either performed or assessed with clinical judgement   ADL Overall ADL's : Needs assistance/impaired     Grooming: Wash/dry face;Moderate assistance;Maximal assistance;Bed level               Lower Body Dressing: Maximal assistance                      Extremity/Trunk Assessment              Vision       Perception     Praxis      Cognition Arousal/Alertness: Awake/alert Behavior During Therapy: Flat affect Overall Cognitive Status: Impaired/Different from baseline Area of Impairment: Orientation, Attention, Memory, Following commands, Safety/judgement, Awareness, Problem solving  Orientation Level: Disoriented to, Time Current Attention Level: Focused Memory: Decreased short-term memory, Decreased recall of precautions Following Commands: Follows one step commands with increased time Safety/Judgement: Decreased awareness of safety, Decreased awareness of deficits Awareness: Intellectual Problem Solving: Slow processing, Decreased initiation, Difficulty sequencing, Requires verbal cues, Requires tactile cues          Exercises      Shoulder Instructions       General  Comments spo2 100% on 2L via [place] taken by earlobe monitor    Pertinent Vitals/ Pain       Pain Assessment Pain Assessment: No/denies pain  Home Living                                          Prior Functioning/Environment              Frequency  Min 2X/week        Progress Toward Goals  OT Goals(current goals can now be found in the care plan section)  Progress towards OT goals: Progressing toward goals     Plan Discharge plan remains appropriate    Co-evaluation                 AM-PAC OT "6 Clicks" Daily Activity     Outcome Measure   Help from another person eating meals?: A Lot Help from another person taking care of personal grooming?: A Lot Help from another person toileting, which includes using toliet, bedpan, or urinal?: Total Help from another person bathing (including washing, rinsing, drying)?: Total Help from another person to put on and taking off regular upper body clothing?: A Lot Help from another person to put on and taking off regular lower body clothing?: A Lot 6 Click Score: 10    End of Session Equipment Utilized During Treatment: Oxygen  OT Visit Diagnosis: Unsteadiness on feet (R26.81);Muscle weakness (generalized) (M62.81)   Activity Tolerance Patient tolerated treatment well   Patient Left in bed;with call bell/phone within reach;with family/visitor present   Nurse Communication Mobility status        Time: 2956-2130 OT Time Calculation (min): 19 min  Charges: OT General Charges $OT Visit: 1 Visit OT Treatments $Therapeutic Activity: 8-22 mins  Shanon Payor, OTD OTR/L  07/09/22, 1:02 PM

## 2022-07-09 NOTE — Progress Notes (Signed)
Called patients wife Fannie Knee) and left message regarding transfer to new room.

## 2022-07-09 NOTE — TOC Progression Note (Addendum)
Transition of Care [facility] 2) - Progression Note    Patient Details  Name: Isami Mehra MRN: [id number] Date of Birth: 10/27/1945  Transition of Care [facility]) CM/SW [place], LCSW Phone Number: 07/09/2022, 9:25 AM  Clinical Narrative:  Sent secure email to [facility] team that assists with [facility] [facility] placement to confirm patient is eligible for [facility] SNF.  1:26 pm: Met with wife and discuss SNF using Medicare vs seeking [facility]-contracted facilities. She prefers to use his Medicare to keep him closer to home. [facility] due to proximity to home. She is interested in [place] SNF's if they cannot offer. Waiting on confirmation from RN regarding colostomy vs ileostomy and if patient still has foley before sending out referral. Uploaded clinicals into  Must for PASARR review.  2:15 pm: PASARR obtained: [id number]. Expires 12/20. [facility] can offer a bed if no Haldol or behaviors for 48 hours. Admissions asked if he would discharge with JP drain. Attending unsure, added surgeon to secure chat.  Expected Discharge Plan: [place]    Expected Discharge Plan and Services Expected Discharge Plan: [place] In-house Referral: Clinical Social Work   Post Acute Care Choice: [place] Living arrangements for the past 2 months: Single Family Home                                       Social Determinants of Health (SDOH) Interventions    Readmission Risk Interventions     No data to display

## 2022-07-09 NOTE — TOC CM/SW Note (Signed)
RE: Carl Figueroa Date of Birth: 11-12-1945 Date: 07/09/2022   To Whom It May Concern:  Please be advised that the above-named patient will require a short-term nursing home stay - anticipated 30 days or less for rehabilitation and strengthening.  The plan is for return home.

## 2022-07-09 NOTE — [place] FL2 (Signed)
[place] MEDICAID FL2 LEVEL OF CARE SCREENING TOOL     IDENTIFICATION  Patient Name: Carl Figueroa Birthdate: 1946/02/28 Sex: male Admission Date (Current Location): 07/07/2022  [place] and [place] Number:  Chiropodist and Address:  [facility], [address] [place], [place] [address]      Provider Number: [id number]  Attending Physician Name and Address:  Sunnie Nielsen, DO  Relative Name and Phone Number:  Haddon Fyfe [phone]    Current Level of Care: Hospital Recommended Level of Care: Skilled Nursing Facility Prior Approval Number:    Date Approved/Denied:   PASRR Number: Manual review  Discharge Plan: SNF    Current Diagnoses: Patient Active Problem List   Diagnosis Date Noted   Severe sepsis (HCC) 07/07/2022   Dementia without behavioral disturbance (HCC) 07/07/2022   Foley catheter in place on admission 07/07/2022   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   Left lower lobe pneumonia 07/07/2022   S/P laparotomy 07/07/2022   Staring episodes 07/07/2022   Long term (current) use of antibiotics 07/07/2022   Hypotension 07/07/2022   Hyperkalemia 07/07/2022   Social discord 01/12/2022   Chronic post-traumatic stress disorder 08/03/2021   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/2021   Impotence 08/03/2021   Primary open angle glaucoma, 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   Tremor 08/03/2021   Memory loss 08/03/2021   Gait abnormality 08/03/2021   Status post total hip replacement, 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   Demand ischemia 07/30/2019   Anxiety 07/30/2019   Acute metabolic encephalopathy 07/08/2019   Hematuria 07/08/2019   Thrombocytopenia (HCC) 07/08/2019   Elevated troponin 07/08/2019   Sepsis (HCC) 07/07/2019   Essential hypertension 07/07/2019   Acute lower UTI 07/07/2019   Acute renal failure superimposed on stage 3a chronic kidney disease (HCC) 07/07/2019    Orientation RESPIRATION BLADDER Height & Weight     Self  O2 (Nasal Cannula 2 L)  Continent, Indwelling catheter Weight: 150 lb (68 kg) Height:  5\' 7"  (170.2 cm)  BEHAVIORAL SYMPTOMS/MOOD NEUROLOGICAL BOWEL NUTRITION STATUS   (None)  (Dementia) Colostomy Diet (DYS 3. Chopped meats.)  AMBULATORY STATUS COMMUNICATION OF NEEDS Skin   Extensive Assist Verbally PU Stage and Appropriate Care   PU Stage 2 Dressing:  (Bilateral coccyx: Foam every 3 days.)                   Personal Care Assistance Level of Assistance  Bathing, Feeding, Dressing Bathing Assistance: Maximum assistance Feeding assistance: Maximum assistance Dressing Assistance: Maximum assistance     Functional Limitations Info  Sight, Hearing, Speech Sight Info: Adequate Hearing Info: Adequate Speech Info: Adequate    SPECIAL CARE FACTORS FREQUENCY  PT (By licensed PT), OT (By licensed OT)     PT Frequency: 5 x week OT Frequency: 5 x week            Contractures Contractures Info: Not present    Additional Factors Info  Code Status, Allergies Code Status Info: Full code Allergies Info: Morphine           Current Medications (07/09/2022):  This is the current hospital active medication list Current Facility-Administered Medications  Medication Dose Route Frequency Provider Last Rate Last Admin    stroke: early stages of recovery book   Does not apply Once 07/11/2022, MD       0.9 %  sodium chloride infusion   Intravenous PRN Andris Baumann, DO   Stopped at 07/08/22 2225   acetaminophen (TYLENOL) tablet 650 mg  650 mg Oral Q4H  PRN Andris Baumann, MD       Or   acetaminophen (TYLENOL) suppository 650 mg  650 mg Rectal Q4H PRN Andris Baumann, MD       Ampicillin-Sulbactam (UNASYN) 3 g in sodium chloride 0.9 % 100 mL IVPB  3 g Intravenous Q6H Dorothea Ogle B, RPH 200 mL/hr at 07/09/22 0900 3 g at 07/09/22 0900   enoxaparin (LOVENOX) injection 40 mg  40 mg Subcutaneous Q24H Lindajo Royal V, MD   40 mg at 07/08/22 2150   haloperidol lactate (HALDOL) injection 2 mg  2 mg  Intravenous Q6H PRN Sunnie Nielsen, DO   2 mg at 07/08/22 2150   LORazepam (ATIVAN) injection 2 mg  2 mg Intravenous Q5 Min x 2 PRN Andris Baumann, MD       ondansetron [facility]) tablet 4 mg  4 mg Oral Q6H PRN Andris Baumann, MD       Or   ondansetron [facility]) injection 4 mg  4 mg Intravenous Q6H PRN Andris Baumann, MD       Oral care mouth rinse  15 mL Mouth Rinse Q2H Andris Baumann, MD   15 mL at 07/09/22 1157   Oral care mouth rinse  15 mL Mouth Rinse PRN Andris Baumann, MD       vancomycin (VANCOCIN) IVPB 1000 mg/200 mL premix  1,000 mg Intravenous Q24H Celene Squibb, [facility]         Discharge Medications: Please see discharge summary for a list of discharge medications.  Relevant Imaging Results:  Relevant Lab Results:   Additional Information SS#: [id number]  Margarito Liner, LCSW

## 2022-07-09 NOTE — Progress Notes (Addendum)
Speech Language Pathology Treatment:    Patient Details Name: Carl Figueroa MRN: [id number] DOB: March 10, 1946 Today's Date: 07/09/2022 Time: 9024-0973 SLP Time Calculation (min) (ACUTE ONLY): 35 min  Assessment / Plan / Recommendation Clinical Impression  Pt seen for diet tolerance. Pt alert and cooperative. Confusion noted. Wife at bedside.   Pt required +2 assistance for repositioning. 1:1 assistance for feeding. Pt observed with items from meal tray including chopped chicken, soft cooked vegetables (peas, carrots), and mashed potatoes as well as iced tea (via straw). Pt with mildly prolonged, but functional, mastication of solids. Pharyngeal swallow appeared [facility] per clinical assessment. No overt s/sx pharyngeal dysphagia noted.   Spoke with RN. Per RN, pt tolerated mech soft diet with thin liquids from breakfast tray without difficulty. Per chart review, WBC trending downward and temp WNL. No recent chest imaging.  Recommend continuation of a mech soft diet with thin liquids with safe swallowing strategies/aspiration precautions as outlined below.   Per screening, speech is fluent. Confusion noted. Pt with hx of Lewy Body Dementia. Should cognitive-linguistic changes persist, pt may benefit from functional cognitive-linguistic evaluation at next level of care. SLP to sign off as pt has no acute SLP needs at this time.   Pt's wife and RN made aware of results, recommendations, and SLP POC. Wife verbalized understanding/agreement.    HPI HPI: Per H&P ": Carl Figueroa is a 76 y.o. male with medical history significant for Hypertension, dementia, CKD 3a, discharged 11/17 from a 1 month admission at the [facility] for appendicitis and intra-abdominal abscesses where he underwent laparotomy with  colectomy and ileostomy placement and discharged with Foley catheter and Augmentin and Zyvox until 12/17(discharge summary unavailable), who was brought to the ED for staring episode x2 since arrival  home, once on the day of discharge on 11/17 and another on 11/18.  He was unresponsive to his name during the episode.  Wife was concerned about a seizure and called EMS.  Patient is intermittently somnolent and unable to contribute to history.  When awakened he denies complaints. Wife admits that she declined SNF placement and wanted him to to go home with home health because she did not trust that the facilities would provide the care he needs or get out of bed."      SLP Plan  All goals met      Recommendations for follow up therapy are one component of a multi-disciplinary discharge planning process, led by the attending physician.  Recommendations may be updated based on patient status, additional functional criteria and insurance authorization.    Recommendations  Diet recommendations: Dysphagia 3 (mechanical soft);Thin liquid Medication Administration:  (whole vs crushed in puree) Supervision: Full supervision/cueing for compensatory strategies;Trained caregiver to feed patient Compensations: Minimize environmental distractions;Slow rate;Small sips/bites Postural Changes and/or Swallow Maneuvers: Seated upright 90 degrees;Upright 30-60 min after meal (assistance with repositioning)                Oral Care Recommendations: Oral care QID;Staff/trained caregiver to provide oral care Follow Up Recommendations: Skilled nursing-short term rehab (<3 hours/day) Assistance recommended at discharge: Frequent or constant Supervision/Assistance SLP Visit Diagnosis: Dysphagia, oral phase (R13.11) Plan: All goals met          Cherrie Gauze, M.S., [facility] [phone] ([place])   Quintella Baton  07/09/2022, 2:11 PM

## 2022-07-09 NOTE — Progress Notes (Signed)
Pharmacy Antibiotic Note  Carl Figueroa is a 76 y.o. male admitted on 07/07/2022 with aspiration pneumonia. PMH significant for HTN, dementia, CKD3a. 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 He was planned to be on augmentin and linezolid through 12/17. Pharmacy has been consulted for Unasyn & Vancomycin dosing.  Plan: Day 2 of antibiotics Increase Vancomycin dose from 750 mg IV Q24H to 1000 mg IV Q24H. Goal AUC 400-550. Expected AUC: 520.6 Expected Css min: 13.6 SCr used: 1.40  Weight used: IBW, Vd used: 0.72 (BMI 23.4) Continue Unasyn 3 g IV Q6H Continue to monitor renal function and follow culture results  Temp (24hrs), Avg:98.1 F (36.7 C), Min:97.6 F (36.4 C), Max:98.3 F (36.8 C)   Recent Labs  Lab 07/07/22 1633 07/07/22 1845 07/08/22 0319 07/09/22 0231  WBC 15.4*  --  12.4* 11.5*  CREATININE 1.79*  --  1.52* 1.40*  LATICACIDVEN 4.3* 3.5*  --   --      Estimated Creatinine Clearance: 42 mL/min (A) (by C-G formula based on SCr of 1.4 mg/dL (H)).    Allergies  Allergen Reactions   Morphine Nausea Only    Antimicrobials this admission: 11/18 Cefepime x1 11/18 Vancomycin >>  11/19 Unasyn >>   Dose adjustments this admission: 11/20 Vancomycin 750 mg IV Q24H >> Vancomycin 1000 mg IV Q24H   Microbiology results: 11/18 BCx: NG<12H (single set drawn)  Thank you for allowing pharmacy to be a part of this patient's care.  Celene Squibb, PharmD PGY1 Pharmacy Resident 07/09/2022 7:23 AM

## 2022-07-09 NOTE — Progress Notes (Signed)
Eeg done 

## 2022-07-09 NOTE — Progress Notes (Signed)
PROGRESS NOTE    Carl Figueroa   XBM:[id number] DOB: [birth year]  DOA: 07/07/2022 Date of Service: 07/09/22 PCP: Mick Sell, MD     Brief Narrative / Hospital Course:  Carl Figueroa is a 76 y.o. male with medical history significant for Hypertension, dementia, CKD 3a, discharged 11/17 from a 1 month admission at the [facility] for appendicitis and intra-abdominal abscesses where he underwent laparotomy with  colectomy and ileostomy placement and discharged with Foley catheter and Augmentin and Zyvox until 12/17(discharge summary unavailable), who was brought to the ED for staring episode x2 since arrival home, once on the day of discharge on 11/17 and another on 11/18.  He was unresponsive to his name during the episode.  Wife was concerned about a seizure and called EMS.  Patient is intermittently somnolent and unable to contribute to history.  When awakened he denies complaints. Wife admits that she declined SNF placement and wanted him to to go home with home health because she did not trust that the facilities would provide the care he needs or get out of bed.  11/18: in ED/early admission -  VS and labs: BP nadir 79/45 in the ED, fluid responsive to 118/58.  Pulse 106 on arrival improving to 86 with fluids.  Respirations 21, O2 sat 96% on room air.  VBG on room air with pH 7.29 and PCO2 53. WBC 15,400 with lactic acid 3.5.  Hemoglobin 9.4, down from baseline of 13.5 about 5 months prior. Creatinine 1.79, up from baseline of 1.35.  Potassium 5.8.  Urinalysis with trace leukocytes. Troponin 64. EKG sinus rhythm at 96 with nonspecific ST-T wave changes.    Imaging: CT head nonacute.  CT chest abdomen and pelvis significant for left lower lobe pneumonia/aspiration,  Diffuse mesenteric haziness/edema - related to fluid overload, congestive, malnutrition or hepatic dysfunction. Only minimal ascites. 2 cm cystic lesion of pancreas at the junction of the body and tail of the pancreas. MRI  without and with contrast recommended when clinically feasible. Slightly dilated left mid abdominal small bowel segments - partial SBO or ileus. Rectal thickening and surrounding stranding which could be congestive rectal tissue vs proctitis. Bladder distension despite catheterization. Prostatomegaly.  Treatment: In ED treated with sepsis fluids started on cefepime and metronidazole and vancomycin and hospitalist consulted for admission for severe sepsis likely LLL pneumonia concern for HCAP vs aspiration, started on Unasyn and vancomycin. Further Investigation: [facility] -  was discharged on 11/17 with a 1 month supply of Augmentin and Zyvox - ?  Bacteremia with complications +/-endocarditis. EEG ordered. CDiff, GI PCR ordered. SLP to see. Surgery consult re: CT findings.  IPAL discussion w/ wife - based on that note, pt remains full code and goal to go home w/ [facility] but wife may consider SNF.  11/19: pt remains confused, unable to complete swallow eval in early AM but on rounds he is alert, oriented to place and self. K improved but still high at 5.5. Cr improved to 1.52. Tropes flat. WBC improved to 12.4. Cdiff and GI PCR negative. PT/OT recs for SNF. SLP notes mild aspiration risk. Gen Surg (Dr Tonna Boehringer) saw pt today - benign exam and no cocnern for intra-abdominal pathology despite the CT read as above, will follow peripherally.  11/20: VSS, potassium normalized, WBC continue to trend down. Lactate also WNL now. SNF placement in process. Transferred from progressive to med surg   Consultants:  General Surgery   Procedures: None  ASSESSMENT & PLAN:   Principal Problem:   Severe sepsis (HCC) Active Problems:   Left lower lobe pneumonia   Foley catheter in place on admission   Acute metabolic encephalopathy   Staring episodes   Acute renal failure superimposed on stage 3a chronic kidney disease (HCC)   S/P laparotomy   Long term (current) use of antibiotics   Hypotension    Colostomy present (HCC)   Hyperkalemia   Essential hypertension   Dementia without behavioral disturbance (HCC)   Left lower lobe pneumonia Severe sepsis Sepsis criteria includes hypotension, tachycardia, leukocytosis with lactic acidosis, AKI and acute encephalopathy Likely source is left lower lobe pneumonia  Gen Surg eval no concern for intraabdominal source  BCx NGx2d  Unasyn and vancomycin for aspiration and HCAP S/p sepsis fluids N.p.o. pending SLP eval --> continue dysphagia 3 diet  Patient was discharged on 11/17 with a 1 month supply of Augmentin and Zyvox - ?  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 colectomy 10-06/2022 Patient had appendicitis with intra-abdominal abscesses Cdiff NEG Follow up discharge summary - awaiting records from the [place]  Essential hypertension Hold antihypertensives, Lasix and lisinopril due to hypotension  Acute renal failure superimposed on stage 3a chronic kidney disease (HCC) Secondary to sepsis IV hydration and monitor renal function and avoid nephrotoxins  Dementia without behavioral disturbance (HCC) At baseline, based on conversation with wife, patient walks and talks but has difficulty conversing and expressing himself on occasion, remote memory is intact but short term memory is a challenge, she needs assistance w/ feeding and dressing.  Delirium precautions  Long term (current) use of antibiotics Patient currently on Augmentin and Zyvox from 11/17 to 12/17 Follow-up discharge  summary from the [place] on 11/17 to see indication  Foley catheter in place on admission CT showing distended bladder in spite of Foley We will attempt repositioning Foley UA minimally abnormal so UTI not suspected at this time  Hypotension - improving  Secondary to sepsis.   Holding all antihypertensives Continue IV sepsis fluid resuscitation  Hyperkalemia Hyperkalemia protocol Monitor and correct as needed     DVT prophylaxis: lovenox Pertinent IV fluids/nutrition: dysphagia 3 diet per SLP,  Central lines / invasive devices: s/p sepsis fluids   Code Status: FULL CODE per IPAL note and confirmed w/ wife 11/19  Disposition: inpatient TOC needs: SNF Barriers to discharge / significant pending items: await cultures, pending clinical improvement, pend EEG results              Subjective:  Patient reports no concerns He can tell me his name and he is in the hospital, he follows basic commands, he reports year is 1980-something and he is unsure who is president of the [place]. These answers are c/w yesterday's interview.  Denies CP/SOB.  Pain controlled.     Family Communication: Spoke at length w/ wife yesterday evening, will reach out to wife again later today     Objective Findings:  Vitals:   07/08/22 1919 07/08/22 2344 07/09/22 0731 07/09/22 1156  BP: 120/65 (!) 116/55 121/70 119/70  Pulse: 77 79 (!) 41  86  Resp: 18 17 18 18   Temp: 98.1 F (36.7 C) 98 F (36.7 C) 98.9 F (37.2 C) 97.8 F (36.6 C)  TempSrc: Axillary Oral    SpO2: 100%  99% (!) 79%  Weight:      Height:        Intake/Output Summary (Last 24 hours) at 07/09/2022 1420 Last data filed at 07/09/2022 0600 Gross per 24 hour  Intake 2519.54 ml  Output 1600 ml  Net 919.54 ml   Filed Weights   07/07/22 1626  Weight: 68 kg    Examination:  Constitutional:  VS as above General Appearance: alert, frail, NAD Respiratory: Minimal respiratory effort CTA at apices, reduced breath sounds at  bases  Cardiovascular: S1/S2 normal No murmur RRR No rub/gallop auscultated No lower extremity edema Gastrointestinal: No tenderness Colostomy and drain in place.  Musculoskeletal:  No clubbing/cyanosis of digits Symmetrical movement in all extremities Neurological: No cranial nerve deficit on limited exam Alert Psychiatric: Poor judgment/insight Flat mood and affect       Scheduled Medications:    stroke: early stages of recovery book   Does not apply Once   enoxaparin (LOVENOX) injection  40 mg Subcutaneous Q24H   mouth rinse  15 mL Mouth Rinse Q2H    Continuous Infusions:  sodium chloride Stopped (07/08/22 2225)   ampicillin-sulbactam (UNASYN) IV 3 g (07/09/22 0900)   vancomycin      PRN Medications:  sodium chloride, acetaminophen **OR** acetaminophen, haloperidol lactate, LORazepam, ondansetron **OR** ondansetron (ZOFRAN) IV, mouth rinse  Antimicrobials:  Anti-infectives (From admission, onward)    Start     Dose/Rate Route Frequency Ordered Stop   07/09/22 2100  vancomycin (VANCOCIN) IVPB 1000 mg/200 mL premix        1,000 mg 200 mL/hr over 60 Minutes Intravenous Every 24 hours 07/09/22 0727 07/14/22 2059   07/08/22 2100  vancomycin (VANCOREADY) IVPB 750 mg/150 mL  Status:  Discontinued        750 mg 150 mL/hr over 60 Minutes Intravenous Every 24 hours 07/07/22 2248 07/09/22 0727   07/08/22 2000  Ampicillin-Sulbactam (UNASYN) 3 g in sodium chloride 0.9 % 100 mL IVPB        3 g 200 mL/hr over 30 Minutes Intravenous Every 6 hours 07/08/22 1422     07/08/22 0600  Ampicillin-Sulbactam (UNASYN) 3 g in sodium chloride 0.9 % 100 mL IVPB  Status:  Discontinued        3 g 200 mL/hr over 30 Minutes Intravenous Every 8 hours 07/07/22 2240 07/08/22 1422   07/07/22 1845  vancomycin (VANCOREADY) IVPB 1250 mg/250 mL        1,250 mg 166.7 mL/hr over 90 Minutes Intravenous  Once 07/07/22 1833 07/07/22 2237   07/07/22 1830  ceFEPIme (MAXIPIME) 2 g in sodium chloride 0.9  % 100 mL IVPB        2 g 200 mL/hr over 30 Minutes Intravenous  Once 07/07/22 1827 07/07/22 2033   07/07/22 1830  metroNIDAZOLE (FLAGYL) IVPB 500 mg        500 mg 100 mL/hr over 60 Minutes Intravenous  Once 07/07/22 1827 07/07/22 2057   07/07/22 1830  vancomycin (VANCOCIN) IVPB 1000 mg/200 mL premix  Status:  Discontinued        1,000 mg 200 mL/hr over 60 Minutes Intravenous  Once 07/07/22 1827 07/07/22 1833           Data Reviewed: I have personally reviewed following labs and imaging studies  CBC: Recent Labs  Lab 07/07/22 1633 07/08/22 0319 07/09/22 0231  WBC 15.4* 12.4* 11.5*  NEUTROABS 13.4*  --   --   HGB 9.4* 7.5* 8.3*  HCT 29.3* 23.3* 25.4*  MCV 86.7 86.0 86.1  PLT 403* 302 261   Basic Metabolic Panel: Recent Labs  Lab 07/07/22 1633 07/08/22 0319 07/08/22 0840 07/08/22 1528 07/08/22 1914 07/08/22 2257 07/09/22 0231 07/09/22 0630  NA 143 144  --   --   --   --  145  --   K 5.8* 5.5*   < > 5.3* 4.9 4.7 4.7 4.5  CL 111 115*  --   --   --   --  114*  --   CO2 22 25  --   --   --   --  26  --   GLUCOSE 143* 104*  --   --   --   --  79  --   BUN 24* 23  --   --   --   --  20  --   CREATININE 1.79* 1.52*  --   --   --   --  1.40*  --   CALCIUM 10.7* 10.1  --   --   --   --  10.1  --    < > = values in this interval not displayed.   GFR: Estimated Creatinine Clearance: 42 mL/min (A) (by C-G formula based on SCr of 1.4 mg/dL (H)). Liver Function Tests: Recent Labs  Lab 07/07/22 1633  AST 35  ALT 20  ALKPHOS 127*  BILITOT 0.8  PROT 8.7*  ALBUMIN 2.8*   No results for input(s): "LIPASE", "AMYLASE" in the last 168 hours. No results for input(s): "AMMONIA" in the last 168 hours. Coagulation Profile: No results for input(s): "INR", "PROTIME" in the last 168 hours. Cardiac Enzymes: No results for input(s): "CKTOTAL", "CKMB", "CKMBINDEX", "TROPONINI" in the last 168 hours. BNP (last 3 results) No results for input(s): "PROBNP" in the last 8760  hours. HbA1C: Recent Labs    07/08/22 0319  HGBA1C 5.8*   CBG: No results for input(s): "GLUCAP" in the last 168 hours. Lipid Profile: Recent Labs    07/08/22 0319  CHOL 139  HDL 34*  LDLCALC 86  TRIG 93  CHOLHDL 4.1   Thyroid Function Tests: No results for input(s): "TSH", "T4TOTAL", "FREET4", "T3FREE", "THYROIDAB" in the last 72 hours. Anemia Panel: No results for input(s): "VITAMINB12", "FOLATE", "FERRITIN", "TIBC", "IRON", "RETICCTPCT" in the last 72 hours. Most Recent Urinalysis On File:     Component Value Date/Time   COLORURINE YELLOW (A) 07/07/2022 1633   APPEARANCEUR CLOUDY (A) 07/07/2022 1633   LABSPEC 1.017 07/07/2022 1633   PHURINE 5.0 07/07/2022 1633   GLUCOSEU NEGATIVE 07/07/2022 1633   HGBUR SMALL (A) 07/07/2022 1633   BILIRUBINUR NEGATIVE 07/07/2022 1633   KETONESUR NEGATIVE 07/07/2022 1633   PROTEINUR 100 (A) 07/07/2022 1633   NITRITE NEGATIVE 07/07/2022 1633   LEUKOCYTESUR TRACE (A) 07/07/2022 1633   Sepsis Labs: @LABRCNTIP (procalcitonin:4,lacticidven:4)  Recent Results (from the past 240 hour(s))  Gastrointestinal Panel by PCR , Stool     Status: None   Collection Time: 07/07/22  4:33 PM   Specimen: Stool  Result Value Ref Range Status   Campylobacter species NOT DETECTED NOT DETECTED Final   Plesimonas shigelloides NOT DETECTED NOT DETECTED Final   Salmonella species NOT DETECTED NOT DETECTED Final   Yersinia enterocolitica NOT DETECTED NOT DETECTED Final   Vibrio species NOT DETECTED NOT DETECTED Final  Vibrio cholerae NOT DETECTED NOT DETECTED Final   Enteroaggregative E coli (EAEC) NOT DETECTED NOT DETECTED Final   Enteropathogenic E coli (EPEC) NOT DETECTED NOT DETECTED Final   Enterotoxigenic E coli (ETEC) NOT DETECTED NOT DETECTED Final   Shiga like toxin producing E coli (STEC) NOT DETECTED NOT DETECTED Final   Shigella/Enteroinvasive E coli (EIEC) NOT DETECTED NOT DETECTED Final   Cryptosporidium NOT DETECTED NOT DETECTED Final    Cyclospora cayetanensis NOT DETECTED NOT DETECTED Final   Entamoeba histolytica NOT DETECTED NOT DETECTED Final   Giardia lamblia NOT DETECTED NOT DETECTED Final   Adenovirus F40/41 NOT DETECTED NOT DETECTED Final   Astrovirus NOT DETECTED NOT DETECTED Final   Norovirus GI/GII NOT DETECTED NOT DETECTED Final   Rotavirus A NOT DETECTED NOT DETECTED Final   Sapovirus (I, II, IV, and V) NOT DETECTED NOT DETECTED Final    Comment: Performed at [facility], [address], [place], [place] [address]  C Difficile Quick Screen w PCR reflex     Status: None   Collection Time: 07/07/22  4:33 PM   Specimen: Stool  Result Value Ref Range Status   C Diff antigen NEGATIVE NEGATIVE Final   C Diff toxin NEGATIVE NEGATIVE Final   C Diff interpretation No C. difficile detected.  Final    Comment: Performed at [facility], [address] [place], [place] [address]  Culture, blood (single)     Status: None (Preliminary result)   Collection Time: 07/07/22  6:34 PM   Specimen: BLOOD RIGHT ARM  Result Value Ref Range Status   Specimen Description BLOOD RIGHT ARM  Final   Special Requests   Final    BOTTLES DRAWN AEROBIC AND ANAEROBIC Blood Culture results may not be optimal due to an inadequate volume of blood received in culture bottles   Culture   Final    NO GROWTH 2 DAYS Performed at [facility], [address] [place], [place] [address]    Report Status PENDING  Incomplete         Radiology Studies: US Carotid Bilateral (at [facility] and [facility] only)  Result Date: 07/08/2022 CLINICAL DATA:  Dementia EXAM: BILATERAL CAROTID DUPLEX ULTRASOUND TECHNIQUE: Wallace Cullens scale imaging, color Doppler and duplex ultrasound were performed of bilateral carotid and vertebral arteries in the neck. COMPARISON:  None Available. FINDINGS: Criteria: Quantification of carotid stenosis is based on velocity parameters that correlate the residual internal carotid diameter with NASCET-based  stenosis levels, using the diameter of the distal internal carotid lumen as the denominator for stenosis measurement. The following velocity measurements were obtained: RIGHT ICA: 124 cm/sec CCA: 80 cm/sec SYSTOLIC ICA/CCA RATIO:  1.5 ECA: 93 cm/sec LEFT ICA: 91 cm/sec CCA: 83 cm/sec SYSTOLIC ICA/CCA RATIO:  1.1 ECA: 87 cm/sec RIGHT CAROTID ARTERY: Patent without plaque or intimal thickening RIGHT VERTEBRAL ARTERY:  Patent with antegrade flow LEFT CAROTID ARTERY:  Patent without plaque or intimal thickening LEFT VERTEBRAL ARTERY:  Patent with antegrade flow IMPRESSION: 1. Negative study. No carotid stenosis identified in the neck by Doppler criteria. 2. The vertebral arteries are patent with antegrade flow. Electronically Signed   By: Agustin Cree M.D.   On: 07/08/2022 09:17   CT CHEST ABDOMEN PELVIS W CONTRAST  Result Date: 07/07/2022 CLINICAL DATA:  Recent surgery for perforated bowel at some point last month, presents with acute abdominal pain with leukocytosis. EXAM: CT CHEST, ABDOMEN, AND PELVIS WITH CONTRAST TECHNIQUE: Multidetector CT imaging of the chest, abdomen and pelvis was performed  following the standard protocol during bolus administration of intravenous contrast. RADIATION DOSE REDUCTION: This exam was performed according to the departmental dose-optimization program which includes automated exposure control, adjustment of the mA and/or kV according to patient size and/or use of iterative reconstruction technique. CONTRAST:  87mL OMNIPAQUE IOHEXOL 300 MG/ML  SOLN COMPARISON:  Portable chest today, chest radiograph 07/06/2019. No prior chest CT. Comparison CT abdomen and pelvis without contrast is available dated 07/06/2019. FINDINGS: CT CHEST FINDINGS Cardiovascular: The cardiac size is normal. There is no pericardial effusion. There are scattered calcific plaques in the LAD coronary artery. There are scattered calcifications of the aortic valve leaflets, mild calcific plaques in the aortic arch  and subclavian arteries. There is no aortic or great vessel aneurysm, dissection or stenosis. The pulmonary arteries and veins are normal caliber. The pulmonary arteries are centrally clear but not evaluated distal to the hila. Mediastinum/Nodes: No enlarged mediastinal, hilar, or axillary lymph nodes. Thyroid gland, trachea, and esophagus demonstrate no significant findings. Lungs/Pleura: Eventration and asymmetric elevation noted left hemidiaphragm. There is patchy airspace disease in the left lower lobe consistent with pneumonia or aspiration with small layering parapneumonic pleural effusion without evidence of empyema. There is no right pleural effusion. No pneumothorax or pleural thickening. Respiratory motion is noted on exam. Central airways and remaining lungs are clear, as visualized. Musculoskeletal: Moderate bilateral shoulder DJD. Mild dextroscoliosis of advanced marginal osteophytosis thoracic spine. 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 No acute or other significant osseous findings. No chest wall mass is seen. CT ABDOMEN PELVIS FINDINGS Technical note: Limited image resolution in the abdomen and pelvis due to patient motion, breathing motion, and streak artifact from the patient's arms in the field and overlying wires. Hepatobiliary: No obvious abnormality. There are tiny stones in the gallbladder proximally but no wall thickening. The bile ducts are normal caliber. Pancreas: No solid mass enhancement. There is a 2 cm homogeneous rounded cystic lesion at the junction of the body and tail of the pancreas, posterior aspect, Hounsfield density of 7 units (see series 2, axial images 54-56). This could be a pseudocyst, serous or mucinous cystadenoma or side branch IPMN. Other etiologies such as malignancy are possible especially given the motion and streak artifacts through the area. MRI without  and with contrast recommended when clinically feasible. There is no pancreatic ductal dilatation or other focal abnormality. Spleen: Not well seen but no obvious abnormality.  No splenomegaly. Adrenals/Urinary Tract: There is no adrenal mass. There is a 1 cm homogeneous cyst in the posterior aspect of the left kidney, Hounsfield density of 7 units. There are a few additional bilateral scattered cortical hypodensities which are too small to characterize. No follow-up imaging is recommended. For reference see JACR 2018 Feb; 264-273, Management of the Incidental Renal Mass on CT, [employer] 2021; 814-848, Bosniak Classification of Cystic Renal Masses, Version 2019. There is bilateral mild renal cortical thinning. There are punctate nonobstructive caliceal stones posteriorly in left kidney. No nephrolithiasis is seen on the right. No hydronephrosis or ureteral stones. The bladder is catheterized but despite this is still distended with air and fluid. Clinical correlation recommended for catheter dysfunction. There is mild bladder thickening versus underdistention. Stomach/Bowel: The patient appears to have undergone a near total colectomy. The rectum remains and is sutured closed in the presacral space, with mild rectal wall thickening and perirectal stranding, the latter which could be congestive or related to fluid overload  or proctitis. 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 A pigtail catheter is noted entering the lateral right mid abdominal wall with the pigtail within a right mid abdominal small bowel segment. There are a few slightly dilated left mid abdominal small bowel segments, maximum caliber 2.9 cm, unknown if this is due to partial small bowel obstruction or ileus but there is no bowel dilatation elsewhere in the abdomen or pelvis. A transitional segment could not be  found. Vascular/Lymphatic: There is diffuse mesenteric haziness. Unclear whether related to fluid overload, congestive, malnutrition or hepatic dysfunction. The abdominal aorta is unremarkable. There are patchy calcific plaques in the common iliac and internal iliac arteries. The portal vein is normal caliber and patent. Accounting for patient motion no enlarged lymph nodes are seen throughout. Reproductive: The prostate is not well seen due to streak artifact from left hip replacement, but transversely it measures enlarged at 5.4 cm. Seminal vesicles are normal in size. Both testicles are in the scrotal sac and there are small scrotal hydroceles. Other: Minimal abdominal and pelvic ascites. There is no free air, abscess or free hemorrhage. Presence of diffuse mesenteric edema limits assessment for focal inflammatory change. There has been interval weight loss since 07/06/2019 with reduction in the intra-abdominal and subcutaneous body fat stores. There is mild body wall anasarca. Musculoskeletal: Left THA. Moderately advanced right hip DJD. There is dextroscoliosis and advanced degenerative change of the lumbar spine. Advanced facet hypertrophy L3-4 down is noted with degenerative grade 1 anterolisthesis at L4-5 and L5-S1 and severe acquired foraminal and spinal canal stenosis at both levels. 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 There is no destructive bone lesion. IMPRESSION: 1. Left lower lobe pneumonia or aspiration with small layering parapneumonic effusion. 2. Aortic and coronary artery atherosclerosis. No thoracic or abdominal aortic aneurysm. 3. Aortic valve leaflet calcifications. Echocardiography may be helpful to assess for aortic stenosis. 4. Diffuse mesenteric haziness/edema. Unclear whether this is related to fluid overload, congestive, malnutrition or hepatic dysfunction. Only minimal ascites. No free air. 5. Cholelithiasis without evidence of cholecystitis. 6. Nonobstructive  micronephrolithiasis. 7. 2 cm cystic lesion at the junction of the body and tail of the pancreas. MRI without and with contrast recommended when clinically feasible. Benign and malignant etiologies are possible. 8. 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 No parastomal hernia. 9. Slightly dilated left mid abdominal small bowel segments, unknown if this is due to partial SBO or ileus. 10. The rectum remains, and there is rectal thickening and surrounding stranding which could be congestive or due to proctitis. 11. Thickened stomach and scattered mid to lower abdominal small bowel segments which could be due to gastroenteritis or congestive. 12. Bladder catheterized but despite this is still distended with air and fluid. Clinical correlation recommended for catheter dysfunction. Cystitis versus bladder nondistention. 13. Prostatomegaly. 14. 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 15. Remaining findings described above. Electronically Signed   By: Almira Bar M.D.   On: 07/07/2022 20:41   CT Head Wo Contrast  Result Date: 07/07/2022 CLINICAL DATA:  Mental status change. EXAM: CT HEAD WITHOUT CONTRAST TECHNIQUE: Contiguous axial images were obtained from the base of the skull through the vertex without intravenous contrast. RADIATION DOSE REDUCTION: This exam was performed according to the departmental dose-optimization program  which includes automated exposure control, adjustment of the mA and/or kV according to patient size and/or use of iterative reconstruction technique. COMPARISON:  Jan 11, 2022 CT scan the brain FINDINGS: Brain: Ventricles and sulci are prominent but stable. No mass effect or midline shift. No cyst subdural, epidural, or subarachnoid hemorrhage. Cerebellum, brainstem, and basal cisterns are normal. Scattered white matter changes are identified. No acute cortical ischemia  or infarct. Vascular: No hyperdense vessel or unexpected calcification. Skull: Normal. Negative for fracture or focal lesion. Sinuses/Orbits: No acute finding. Other: None. IMPRESSION: Chronic white matter changes. No acute intracranial abnormalities. Electronically Signed   By: Gerome Sam III M.D.   On: 07/07/2022 17:18   DG Chest Portable 1 View  Result Date: 07/07/2022 CLINICAL DATA:  Shortness of breath and tachycardia. EXAM: PORTABLE CHEST 1 VIEW COMPARISON:  07/06/2019 FINDINGS: The cardiomediastinal silhouette is unremarkable. Elevation of the LEFT hemidiaphragm with mild LEFT basilar atelectasis/scarring again noted. There is no evidence of focal airspace disease, pulmonary edema, suspicious pulmonary nodule/mass, pleural effusion, or pneumothorax. No acute bony abnormalities are identified. IMPRESSION: No active disease. Electronically Signed   By: Harmon Pier M.D.   On: 07/07/2022 16:37            LOS: 2 days        Sunnie Nielsen, DO [facility] 07/09/2022, 2:20 PM    Dictation software may have been used to generate the above note. Typos may occur and escape review in typed/dictated notes. Please contact Dr Lyn Hollingshead directly for clarity if needed.  Staff may message me via secure chat in Epic  but this may not receive an immediate response,  please page me for urgent matters!  If 7PM-7AM, please contact night coverage [email or web address]

## 2022-07-09 NOTE — Progress Notes (Signed)
SLP Cancellation Note  Patient Details Name: Carl Figueroa MRN: [id number] DOB: 26-Oct-1945   Cancelled treatment:       Reason Eval/Treat Not Completed: Fatigue/lethargy limiting ability to participate;Patient declined, no reason specified  Chart review completed. Noted WBC trending downward. Temp WNL. No recent CXR. Attempted diet tolerance with pt. Pt lethargic, but roused with repositioning. Despite encouragement/cueing/education, pt politely declined all POs. Recommend cautious continuation of current diet. SLP to continue efforts as appropriate. RN made aware of the above.  Clyde Canterbury, M.S., CCC-SLP Speech-Language Pathologist [facility] [phone] Arnette Felts)   Woodroe Chen 07/09/2022, 9:31 AM

## 2022-07-10 DIAGNOSIS — R6520 Severe sepsis without septic shock: Secondary | ICD-10-CM | POA: Diagnosis not present

## 2022-07-10 DIAGNOSIS — R4182 Altered mental status, unspecified: Secondary | ICD-10-CM | POA: Diagnosis not present

## 2022-07-10 DIAGNOSIS — A419 Sepsis, unspecified organism: Secondary | ICD-10-CM | POA: Diagnosis not present

## 2022-07-10 LAB — BASIC METABOLIC PANEL
Anion gap: 5 (ref 5–15)
BUN: 16 mg/dL (ref 8–23)
CO2: 27 mmol/L (ref 22–32)
Calcium: 10 mg/dL (ref 8.9–10.3)
Chloride: 115 mmol/L — ABNORMAL HIGH (ref 98–111)
Creatinine, Ser: 1.14 mg/dL (ref 0.61–1.24)
GFR, Estimated: >60 mL/min (ref >60)
Glucose, Bld: 98 mg/dL (ref 70–99)
Potassium: 4 mmol/L (ref 3.5–5.1)
Sodium: 147 mmol/L — ABNORMAL HIGH (ref 135–145)

## 2022-07-10 LAB — CBC
HCT: 24.7 % — ABNORMAL LOW (ref 39.0–52.0)
Hemoglobin: 8.1 g/dL — ABNORMAL LOW (ref 13.0–17.0)
MCH: 28.2 pg (ref 26.0–34.0)
MCHC: 32.8 g/dL (ref 30.0–36.0)
MCV: 86.1 fL (ref 80.0–100.0)
Platelets: 242 10*3/uL (ref 150–400)
RBC: 2.87 MIL/uL — ABNORMAL LOW (ref 4.22–5.81)
RDW: 14.6 % (ref 11.5–15.5)
WBC: 9 10*3/uL (ref 4.0–10.5)
nRBC: 0 % (ref 0.0–0.2)

## 2022-07-10 MED ORDER — AMOXICILLIN-POT CLAVULANATE 875-125 MG PO TABS
1.0000 | ORAL_TABLET | Freq: Two times a day (BID) | ORAL | Status: AC
  Administered 2022-07-10 – 2022-07-13 (×7): 1 via ORAL
  Filled 2022-07-10 (×7): qty 1

## 2022-07-10 NOTE — Progress Notes (Signed)
Pharmacy Antibiotic Note  Carl Figueroa is a 76 y.o. male admitted on 07/07/2022 with aspiration pneumonia. PMH significant for HTN, dementia, CKD3a. 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 He was planned to be on augmentin and linezolid through 12/17. Pharmacy has been consulted for Unasyn & Vancomycin dosing.  Plan: Day 2 of antibiotics Increase Vancomycin dose from 750 mg IV Q24H to 1000 mg IV Q24H. Goal AUC 400-550. Expected AUC: 528.5 Expected Css min: 36.8 SCr used: 1.14  Weight used: IBW, Vd used: 0.72 (BMI 23.4) Continue Unasyn 3 g IV Q6H Continue to monitor renal function and follow culture results  Temp (24hrs), Avg:98.1 F (36.7 C), Min:97.8 F (36.6 C), Max:98.3 F (36.8 C)   Recent Labs  Lab 07/07/22 1633 07/07/22 1845 07/08/22 0319 07/09/22 0231 07/09/22 0840 07/10/22 0556  WBC 15.4*  --  12.4* 11.5*  --  9.0  CREATININE 1.79*  --  1.52* 1.40*  --  1.14  LATICACIDVEN 4.3* 3.5*  --   --  1.0  --      Estimated Creatinine Clearance: 43.4 mL/min (by C-G formula based on SCr of 1.14 mg/dL).    Allergies  Allergen Reactions   Morphine Nausea Only    Antimicrobials this admission: 11/18 Cefepime x1 11/18 Vancomycin >>  11/19 Unasyn >>   Dose adjustments this admission: 11/20 Vancomycin 750 mg IV Q24H >> Vancomycin 1000 mg IV Q24H   Microbiology results: 11/18 BCx: NG<12H (single set drawn)  Thank you for allowing pharmacy to be a part of this patient's care.  Selinda Eon Clinical Pharmacist 07/10/2022 8:18 AM

## 2022-07-10 NOTE — Plan of Care (Signed)
  Problem: Fluid Volume: Goal: Hemodynamic stability will improve Outcome: Progressing   Problem: Clinical Measurements: Goal: Diagnostic test results will improve Outcome: Progressing Goal: Signs and symptoms of infection will decrease Outcome: Progressing   Problem: Respiratory: Goal: Ability to maintain adequate ventilation will improve Outcome: Progressing   Problem: Education: Goal: Knowledge of disease or condition will improve Outcome: Progressing Goal: Knowledge of secondary prevention will improve (MUST DOCUMENT ALL) Outcome: Progressing Goal: Knowledge of patient specific risk factors will improve (Mark N/A or DELETE if not current risk factor) Outcome: Progressing   Problem: Ischemic Stroke/TIA Tissue Perfusion: Goal: Complications of ischemic stroke/TIA will be minimized Outcome: Progressing   Problem: Coping: Goal: Will verbalize positive feelings about self Outcome: Progressing Goal: Will identify appropriate support needs Outcome: Progressing   Problem: Health Behavior/Discharge Planning: Goal: Ability to manage health-related needs will improve Outcome: Progressing Goal: Goals will be collaboratively established with patient/family Outcome: Progressing   Problem: Self-Care: Goal: Ability to participate in self-care as condition permits will improve Outcome: Progressing Goal: Verbalization of feelings and concerns over difficulty with self-care will improve Outcome: Progressing Goal: Ability to communicate needs accurately will improve Outcome: Progressing   Problem: Nutrition: Goal: Risk of aspiration will decrease Outcome: Progressing Goal: Dietary intake will improve Outcome: Progressing   Problem: Education: Goal: Knowledge of General Education information will improve Description: Including pain rating scale, medication(s)/side effects and non-pharmacologic comfort measures Outcome: Progressing   Problem: Health Behavior/Discharge  Planning: Goal: Ability to manage health-related needs will improve Outcome: Progressing   Problem: Clinical Measurements: Goal: Ability to maintain clinical measurements within normal limits will improve Outcome: Progressing Goal: Will remain free from infection Outcome: Progressing Goal: Diagnostic test results will improve Outcome: Progressing Goal: Respiratory complications will improve Outcome: Progressing Goal: Cardiovascular complication will be avoided Outcome: Progressing   Problem: Activity: Goal: Risk for activity intolerance will decrease Outcome: Progressing   Problem: Nutrition: Goal: Adequate nutrition will be maintained Outcome: Progressing   Problem: Coping: Goal: Level of anxiety will decrease Outcome: Progressing   Problem: Elimination: Goal: Will not experience complications related to bowel motility Outcome: Progressing Goal: Will not experience complications related to urinary retention Outcome: Progressing   Problem: Pain Managment: Goal: General experience of comfort will improve Outcome: Progressing   Problem: Safety: Goal: Ability to remain free from injury will improve Outcome: Progressing   Problem: Skin Integrity: Goal: Risk for impaired skin integrity will decrease Outcome: Progressing   

## 2022-07-10 NOTE — Progress Notes (Addendum)
Pharmacy - Antimicrobial Stewardship  Linezolid and amoxicillin/clavulanate filled on 11/17 at [facility] pharmacy for a 5 day duration (10 pills for each) per records available in EPIC.  I called his wife and she states that antibiotic duration was 5 days and he was to finish antibiotics soon. Look at the [place] record in Care Everywhere, I do see at date of 08/15/2022 with linezolid and amoxicillin/clav prescription but this is under the column of when prescription expires and not the duration of antibiotic  Above information sent via chat to hospitalist  Plan: Given vancomycin today to cover through 11/22 (should have been stop date for linezolid) Continuing amoxicillin/clavulanate for now for pneumonia  Juliette Alcide, PharmD, BCPS, BCIDP Work Cell: [phone] 07/10/2022 3:33 PM

## 2022-07-10 NOTE — Procedures (Signed)
Routine EEG Report  Carl Figueroa is a 76 y.o. male with a history of staring spells concerning for seizure who is undergoing an EEG to evaluate for seizures.  Report: This EEG was acquired with electrodes placed according to the International 10-20 electrode system (including Fp1, Fp2, F3, F4, C3, C4, P3, P4, O1, O2, T3, T4, T5, T6, A1, A2, Fz, Cz, Pz). The following electrodes were missing or displaced: none.  The occipital dominant rhythm was 5-6 Hz. This activity is reactive to stimulation. Drowsiness was manifested by background fragmentation; deeper stages of sleep were not identified. There was bifrontal slowing and intermittent periods lasting 1-2 seconds of diffuse delta slowing. There were no interictal epileptiform discharges. There were no electrographic seizures identified. There was no abnormal response to photic stimulation or hyperventilation.   Impression and clinical correlation: This EEG was obtained while awake and drowsy and is abnormal due to: - moderate to severe diffuse slowing indicative of global cerebral dysfunction - bifrontal focal slowing indicative of focal cerebral dysfunction in these regions  Epileptiform abnormalities were not seen during this recording.  Bing Neighbors, MD [facility] [phone]  If 7pm- 7am, please page neurology on call as listed in AMION.

## 2022-07-10 NOTE — Progress Notes (Addendum)
PROGRESS NOTE    Carl Figueroa   [id number] DOB: 08-Apr-1946  DOA: 07/07/2022 Date of Service: 07/10/22 PCP: Leonel Ramsay, MD     Brief Narrative / Hospital Course:  Carl Figueroa is a 76 y.o. male with medical history significant for Hypertension, dementia, CKD 3a, discharged 11/17 from a 1 month admission at the [facility] for appendicitis and intra-abdominal abscesses where he underwent laparotomy with  colectomy and ileostomy placement and discharged with Foley catheter and Augmentin and Zyvox until 12/17(discharge summary unavailable), who was brought to the ED for staring episode x2 since arrival home, once on the day of discharge on 11/17 and another on 11/18.  He was unresponsive to his name during the episode.  Wife was concerned about a seizure and called EMS.  Patient is intermittently somnolent and unable to contribute to history.  When awakened he denies complaints. Wife admits that she declined SNF placement and wanted him to to go home with home health because she did not trust that the facilities would provide the care he needs or get out of bed.  11/18: in ED/early admission -  VS and labs: BP nadir 79/45 in the ED, fluid responsive to 118/58.  Pulse 106 on arrival improving to 86 with fluids.  Respirations 21, O2 sat 96% on room air.  VBG on room air with pH 7.29 and PCO2 53. WBC 15,400 with lactic acid 3.5.  Hemoglobin 9.4, down from baseline of 13.5 about 5 months prior. Creatinine 1.79, up from baseline of 1.35.  Potassium 5.8.  Urinalysis with trace leukocytes. Troponin 64. EKG sinus rhythm at 96 with nonspecific ST-T wave changes.    Imaging: CT head nonacute.  CT chest abdomen and pelvis significant for left lower lobe pneumonia/aspiration,  Diffuse mesenteric haziness/edema - related to fluid overload, congestive, malnutrition or hepatic dysfunction. Only minimal ascites. 2 cm cystic lesion of pancreas at the junction of the body and tail of the pancreas. MRI  without and with contrast recommended when clinically feasible. Slightly dilated left mid abdominal small bowel segments - partial SBO or ileus. Rectal thickening and surrounding stranding which could be congestive rectal tissue vs proctitis. Bladder distension despite catheterization. Prostatomegaly.  Treatment: In ED treated with sepsis fluids started on cefepime and metronidazole and vancomycin and hospitalist consulted for admission for severe sepsis likely LLL pneumonia concern for HCAP vs aspiration, started on Unasyn and vancomycin. Further Investigation: [facility] -  was discharged on 11/17 with a 1 month supply of Augmentin and Zyvox - ?  Bacteremia with complications +/-endocarditis. EEG ordered. CDiff, GI PCR ordered. SLP to see. Surgery consult re: CT findings.  IPAL discussion w/ wife - based on that note, pt remains full code and goal to go home w/ [facility] but wife may consider SNF.  11/19: pt remains confused, unable to complete swallow eval in early AM but on rounds he is alert, oriented to place and self. K improved but still high at 5.5. Cr improved to 1.52. Tropes flat. WBC improved to 12.4. Cdiff and GI PCR negative. PT/OT recs for SNF. SLP notes mild aspiration risk. Gen Surg (Dr Lysle Pearl) saw pt today - benign exam and no cocnern for intra-abdominal pathology despite the CT read as above, will follow peripherally.  11/20: VSS, potassium normalized, WBC continue to trend down. Lactate also WNL now. SNF placement in process. Transferred from progressive to med surg  11/21: stable today, no concerns. Staying on one more day vanc.   Consultants:  General Surgery   Procedures: None       ASSESSMENT & PLAN:   Principal Problem:   Severe sepsis (HCC) Active Problems:   Left lower lobe pneumonia   Foley catheter in place on admission   Acute metabolic encephalopathy   Staring episodes   Acute renal failure superimposed on stage 3a chronic kidney disease ([place])   S/P  laparotomy   Long term (current) use of antibiotics   Hypotension   Colostomy present (HCC)   Hyperkalemia   Essential hypertension   Dementia without behavioral disturbance (HCC)   Left lower lobe pneumonia Severe sepsis Sepsis criteria includes hypotension, tachycardia, leukocytosis with lactic acidosis, AKI and acute encephalopathy Likely source is left lower lobe pneumonia  Gen Surg eval no concern for intraabdominal source  BCx NGx2d  SEPSIS RESOLVED Unasyn and vancomycin for aspiration and HCAP --> augmentin po today and leave on vanc one more dose.   S/p sepsis fluids N.p.o. pending SLP eval --> continue dysphagia 3 diet  Patient was discharged on 11/17 with a 1 month supply of Augmentin and Zyvox - ?  Bacteremia with complications +/-endocarditis --> pharmacy confirmed this was just a 5-day course abx and the Rx expired in a month, not duration for a month. Attempt to obtain discharge summary from the [place] Clinically improving, continue to monitor WBC and sepsis parameters   Staring episodes Possible TIAs but unlikely  Acute metabolic encephalopathy CT head is negative EEG - "moderate to severe diffuse slowing indicative of global cerebral dysfunction, - bifrontal focal slowing indicative of focal cerebral dysfunction in these regions. 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 10-06/2022 Patient had appendicitis with intra-abdominal abscesses Cdiff NEG Follow up discharge summary - awaiting records from the [place] hypertension Hold antihypertensives, Lasix and lisinopril due to hypotension  Acute renal failure superimposed on stage 3a chronic kidney disease ([place]) Secondary to sepsis IV hydration  and monitor renal function and avoid nephrotoxins  Dementia without behavioral disturbance ([place]) At baseline, based on conversation with wife, patient walks and talks but has difficulty conversing and expressing himself on occasion, remote memory is intact but short term memory is a challenge, she needs assistance w/ feeding and dressing.  Delirium precautions  Long term (current) use of antibiotics Patient currently on Augmentin and Zyvox from 11/17 to 12/17 Follow-up discharge summary from the [place] on 11/17 to see indication  Foley catheter in place on admission CT showing distended bladder in spite of Foley We will attempt repositioning Foley UA minimally abnormal so UTI not suspected at this time  Hypotension - improving  Secondary to sepsis.   Holding all antihypertensives Continue IV sepsis fluid resuscitation  Hyperkalemia - resolved Hyperkalemia protocol Monitor and correct as needed     DVT prophylaxis: lovenox Pertinent IV fluids/nutrition: dysphagia 3 diet per SLP,  Central lines / invasive devices: s/p sepsis fluids   Code Status: FULL CODE per IPAL note and confirmed w/ wife 11/19  Disposition: inpatient TOC needs: SNF Barriers to discharge / significant pending items: await SNF             Subjective:  Patient reports no concerns other than some soreness in the back  He can tell me his name and he is in the hospital, he  follows basic commands, he reports year is 1980-something and he is unsure who is president of the [place]. These answers are c/w yesterday's interview.  Denies CP/SOB.  Pain controlled.     Family Communication: spoke w/ wife over the phone 07/10/22.     Objective Findings:  Vitals:   07/09/22 2043 07/09/22 2209 07/10/22 0814 07/10/22 1538  BP: (!) 140/68 (!) 148/74 (!) 129/95 119/60  Pulse: 82 89 85 92  Resp:  18 17 18   Temp: 98.2 F (36.8 C) 98.2 F (36.8 C) 98.3 F (36.8 C) 98.2 F (36.8 C)  TempSrc: Oral     SpO2: 98%  99% 98% 99%  Weight:  55.7 kg    Height:        Intake/Output Summary (Last 24 hours) at 07/10/2022 1701 Last data filed at 07/10/2022 1558 Gross per 24 hour  Intake 1200.98 ml  Output 1650 ml  Net -449.02 ml   Filed Weights   07/07/22 1626 07/09/22 2209  Weight: 68 kg 55.7 kg    Examination:  Constitutional:  VS as above General Appearance: alert, frail, NAD Respiratory: Minimal respiratory effort CTA at apices, reduced breath sounds at bases  Cardiovascular: S1/S2 normal No murmur RRR No rub/gallop auscultated No lower extremity edema Gastrointestinal: No tenderness Colostomy and drain in place.  Musculoskeletal:  No clubbing/cyanosis of digits Symmetrical movement in all extremities Neurological: No cranial nerve deficit on limited exam Alert Psychiatric: Poor judgment/insight Flat mood and affect       Scheduled Medications:    stroke: early stages of recovery book   Does not apply Once   amoxicillin-clavulanate  1 tablet Oral Q12H   enoxaparin (LOVENOX) injection  40 mg Subcutaneous Q24H   mouth rinse  15 mL Mouth Rinse Q2H    Continuous Infusions:  sodium chloride Stopped (07/08/22 2225)   vancomycin 1,000 mg (07/09/22 2318)    PRN Medications:  sodium chloride, acetaminophen **OR** acetaminophen, haloperidol lactate, LORazepam, ondansetron **OR** ondansetron (ZOFRAN) IV, mouth rinse  Antimicrobials:  Anti-infectives (From admission, onward)    Start     Dose/Rate Route Frequency Ordered Stop   07/10/22 2200  amoxicillin-clavulanate (AUGMENTIN) 875-125 MG per tablet 1 tablet        1 tablet Oral Every 12 hours 07/10/22 1610     07/09/22 2100  vancomycin (VANCOCIN) IVPB 1000 mg/200 mL premix        1,000 mg 200 mL/hr over 60 Minutes Intravenous Every 24 hours 07/09/22 0727 07/11/22 2059   07/08/22 2100  vancomycin (VANCOREADY) IVPB 750 mg/150 mL  Status:  Discontinued        750 mg 150 mL/hr over 60 Minutes Intravenous Every 24 hours  07/07/22 2248 07/09/22 0727   07/08/22 2000  Ampicillin-Sulbactam (UNASYN) 3 g in sodium chloride 0.9 % 100 mL IVPB  Status:  Discontinued        3 g 200 mL/hr over 30 Minutes Intravenous Every 6 hours 07/08/22 1422 07/10/22 1610   07/08/22 0600  Ampicillin-Sulbactam (UNASYN) 3 g in sodium chloride 0.9 % 100 mL IVPB  Status:  Discontinued        3 g 200 mL/hr over 30 Minutes Intravenous Every 8 hours 07/07/22 2240 07/08/22 1422   07/07/22 1845  vancomycin (VANCOREADY) IVPB 1250 mg/250 mL        1,250 mg 166.7 mL/hr over 90 Minutes Intravenous  Once 07/07/22 1833 07/07/22 2237   07/07/22 1830  ceFEPIme (MAXIPIME) 2 g in sodium chloride 0.9 % 100 mL IVPB  2 g 200 mL/hr over 30 Minutes Intravenous  Once 07/07/22 1827 07/07/22 2033   07/07/22 1830  metroNIDAZOLE (FLAGYL) IVPB 500 mg        500 mg 100 mL/hr over 60 Minutes Intravenous  Once 07/07/22 1827 07/07/22 2057   07/07/22 1830  vancomycin (VANCOCIN) IVPB 1000 mg/200 mL premix  Status:  Discontinued        1,000 mg 200 mL/hr over 60 Minutes Intravenous  Once 07/07/22 1827 07/07/22 1833           Data Reviewed: I have personally reviewed following labs and imaging studies  CBC: Recent Labs  Lab 07/07/22 1633 07/08/22 0319 07/09/22 0231 07/10/22 0556  WBC 15.4* 12.4* 11.5* 9.0  NEUTROABS 13.4*  --   --   --   HGB 9.4* 7.5* 8.3* 8.1*  HCT 29.3* 23.3* 25.4* 24.7*  MCV 86.7 86.0 86.1 86.1  PLT 403* 302 261 [id number]   Basic Metabolic Panel: Recent Labs  Lab 07/07/22 1633 07/08/22 0319 07/08/22 0840 07/08/22 1914 07/08/22 2257 07/09/22 0231 07/09/22 0630 07/10/22 0556  NA 143 144  --   --   --  145  --  147*  K 5.8* 5.5*   < > 4.9 4.7 4.7 4.5 4.0  CL 111 115*  --   --   --  114*  --  115*  CO2 22 25  --   --   --  26  --  27  GLUCOSE 143* 104*  --   --   --  79  --  98  BUN 24* 23  --   --   --  20  --  16  CREATININE 1.79* 1.52*  --   --   --  1.40*  --  1.14  CALCIUM 10.7* 10.1  --   --   --  10.1  --  10.0    < > = values in this interval not displayed.   GFR: Estimated Creatinine Clearance: 43.4 mL/min (by C-G formula based on SCr of 1.14 mg/dL). Liver Function Tests: Recent Labs  Lab 07/07/22 1633  AST 35  ALT 20  ALKPHOS 127*  BILITOT 0.8  PROT 8.7*  ALBUMIN 2.8*   No results for input(s): "LIPASE", "AMYLASE" in the last 168 hours. No results for input(s): "AMMONIA" in the last 168 hours. Coagulation Profile: No results for input(s): "INR", "PROTIME" in the last 168 hours. Cardiac Enzymes: No results for input(s): "CKTOTAL", "CKMB", "CKMBINDEX", "TROPONINI" in the last 168 hours. BNP (last 3 results) No results for input(s): "PROBNP" in the last 8760 hours. HbA1C: Recent Labs    07/08/22 0319  HGBA1C 5.8*   CBG: No results for input(s): "GLUCAP" in the last 168 hours. Lipid Profile: Recent Labs    07/08/22 0319  CHOL 139  HDL 34*  LDLCALC 86  TRIG 93  CHOLHDL 4.1   Thyroid Function Tests: No results for input(s): "TSH", "T4TOTAL", "FREET4", "T3FREE", "THYROIDAB" in the last 72 hours. Anemia Panel: No results for input(s): "VITAMINB12", "FOLATE", "FERRITIN", "TIBC", "IRON", "RETICCTPCT" in the last 72 hours. Most Recent Urinalysis On File:     Component Value Date/Time   COLORURINE YELLOW (A) 07/07/2022 1633   APPEARANCEUR CLOUDY (A) 07/07/2022 1633   LABSPEC 1.017 07/07/2022 1633   PHURINE 5.0 07/07/2022 1633   GLUCOSEU NEGATIVE 07/07/2022 1633   HGBUR SMALL (A) 07/07/2022 1633   BILIRUBINUR NEGATIVE 07/07/2022 1633   [place] 07/07/2022 1633   PROTEINUR 100 (A) 07/07/2022 1633  NITRITE NEGATIVE 07/07/2022 1633   LEUKOCYTESUR TRACE (A) 07/07/2022 1633   Sepsis Labs: @LABRCNTIP (procalcitonin:4,lacticidven:4)  Recent Results (from the past 240 hour(s))  Gastrointestinal Panel by PCR , Stool     Status: None   Collection Time: 07/07/22  4:33 PM   Specimen: Stool  Result Value Ref Range Status   Campylobacter species NOT DETECTED NOT  DETECTED Final   Plesimonas shigelloides NOT DETECTED NOT DETECTED Final   Salmonella species NOT DETECTED NOT DETECTED Final   Yersinia enterocolitica NOT DETECTED NOT DETECTED Final   Vibrio species NOT DETECTED NOT DETECTED Final   Vibrio cholerae NOT DETECTED NOT DETECTED Final   Enteroaggregative E coli (EAEC) NOT DETECTED NOT DETECTED Final   Enteropathogenic E coli (EPEC) NOT DETECTED NOT DETECTED Final   Enterotoxigenic E coli (ETEC) NOT DETECTED NOT DETECTED Final   Shiga like toxin producing E coli (STEC) NOT DETECTED NOT DETECTED Final   Shigella/Enteroinvasive E coli (EIEC) NOT DETECTED NOT DETECTED Final   Cryptosporidium NOT DETECTED NOT DETECTED Final   Cyclospora cayetanensis NOT DETECTED NOT DETECTED Final   Entamoeba histolytica NOT DETECTED NOT DETECTED Final   Giardia lamblia NOT DETECTED NOT DETECTED Final   Adenovirus F40/41 NOT DETECTED NOT DETECTED Final   Astrovirus NOT DETECTED NOT DETECTED Final   Norovirus GI/GII NOT DETECTED NOT DETECTED Final   Rotavirus A NOT DETECTED NOT DETECTED Final   Sapovirus (I, II, IV, and V) NOT DETECTED NOT DETECTED Final    Comment: Performed at [facility], [address] [place], [place]  C Difficile Quick Screen w PCR reflex     Status: None   Collection Time: 07/07/22  4:33 PM   Specimen: Stool  Result Value Ref Range Status   C Diff antigen NEGATIVE NEGATIVE Final   C Diff toxin NEGATIVE NEGATIVE Final   C Diff interpretation No C. difficile detected.  Final    Comment: Performed at [facility], [address] [place], [place]  Culture, blood (single)     Status: None (Preliminary result)   Collection Time: 07/07/22  6:34 PM   Specimen: BLOOD RIGHT ARM  Result Value Ref Range Status   Specimen Description BLOOD RIGHT ARM  Final   Special Requests   Final    BOTTLES DRAWN AEROBIC AND ANAEROBIC Blood Culture results may not be optimal due to an inadequate volume of blood  received in culture bottles   Culture   Final    NO GROWTH 3 DAYS Performed at [facility], [address] [place], [place]    Report Status PENDING  Incomplete  MRSA Next Gen by PCR, Nasal     Status: None   Collection Time: 07/09/22  3:49 PM   Specimen: Nasal Mucosa; Nasal Swab  Result Value Ref Range Status   MRSA by PCR Next Gen NOT DETECTED NOT DETECTED Final    Comment: (NOTE) The GeneXpert MRSA Assay (FDA approved for NASAL specimens only), is one component of a comprehensive MRSA colonization surveillance program. It is not intended to diagnose MRSA infection nor to guide or monitor treatment for MRSA infections. Test performance is not FDA approved in patients less than 74 years old. Performed at [facility], [address], [place], [place]          Radiology Studies: 46962 Carotid Bilateral (at [facility] and [facility] only)  Result Date: 07/08/2022 CLINICAL DATA:  Dementia EXAM: BILATERAL CAROTID DUPLEX ULTRASOUND TECHNIQUE: 07/10/2022 scale imaging, color  Doppler and duplex ultrasound were performed of bilateral carotid and vertebral arteries in the neck. COMPARISON:  None Available. FINDINGS: Criteria: Quantification of carotid stenosis is based on velocity parameters that correlate the residual internal carotid diameter with NASCET-based stenosis levels, using the diameter of the distal internal carotid lumen as the denominator for stenosis measurement. The following velocity measurements were obtained: RIGHT ICA: 124 cm/sec CCA: 80 cm/sec SYSTOLIC ICA/CCA RATIO:  1.5 ECA: 93 cm/sec LEFT ICA: 91 cm/sec CCA: 83 cm/sec SYSTOLIC ICA/CCA RATIO:  1.1 ECA: 87 cm/sec RIGHT CAROTID ARTERY: Patent without plaque or intimal thickening RIGHT VERTEBRAL ARTERY:  Patent with antegrade flow LEFT CAROTID ARTERY:  Patent without plaque or intimal thickening LEFT VERTEBRAL ARTERY:  Patent with antegrade flow IMPRESSION: 1. Negative study. No carotid stenosis identified  in the neck by Doppler criteria. 2. The vertebral arteries are patent with antegrade flow. Electronically Signed   By: Darrin Nipper M.D.   On: 07/08/2022 09:17   CT CHEST ABDOMEN PELVIS W CONTRAST  Result Date: 07/07/2022 CLINICAL DATA:  Recent surgery for perforated bowel at some point last month, presents with acute abdominal pain with leukocytosis. EXAM: CT CHEST, ABDOMEN, AND PELVIS WITH CONTRAST TECHNIQUE: Multidetector CT imaging of the chest, abdomen and pelvis was performed following the standard protocol during bolus administration of intravenous contrast. RADIATION DOSE REDUCTION: This exam was performed according to the departmental dose-optimization program which includes automated exposure control, adjustment of the mA and/or kV according to patient size and/or use of iterative reconstruction technique. CONTRAST:  55mL OMNIPAQUE IOHEXOL 300 MG/ML  SOLN COMPARISON:  Portable chest today, chest radiograph 07/06/2019. No prior chest CT. Comparison CT abdomen and pelvis without contrast is available dated 07/06/2019. FINDINGS: CT CHEST FINDINGS Cardiovascular: The cardiac size is normal. There is no pericardial effusion. There are scattered calcific plaques in the LAD coronary artery. There are scattered calcifications of the aortic valve leaflets, mild calcific plaques in the aortic arch and subclavian arteries. There is no aortic or great vessel aneurysm, dissection or stenosis. The pulmonary arteries and veins are normal caliber. The pulmonary arteries are centrally clear but not evaluated distal to the hila. Mediastinum/Nodes: No enlarged mediastinal, hilar, or axillary lymph nodes. Thyroid gland, trachea, and esophagus demonstrate no significant findings. Lungs/Pleura: Eventration and asymmetric elevation noted left hemidiaphragm. There is patchy airspace disease in the left lower lobe consistent with pneumonia or aspiration with small layering parapneumonic pleural effusion without evidence of  empyema. There is no right pleural effusion. No pneumothorax or pleural thickening. Respiratory motion is noted on exam. Central airways and remaining lungs are clear, as visualized. Musculoskeletal: Moderate bilateral shoulder DJD. Mild dextroscoliosis of advanced marginal osteophytosis thoracic spine. 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 No acute or other significant osseous findings. No chest wall mass is seen. CT ABDOMEN PELVIS FINDINGS Technical note: Limited image resolution in the abdomen and pelvis due to patient motion, breathing motion, and streak artifact from the patient's arms in the field and overlying wires. Hepatobiliary: No obvious abnormality. There are tiny stones in the gallbladder proximally but no wall thickening. The bile ducts are normal caliber. Pancreas: No solid mass enhancement. There is a 2 cm homogeneous rounded cystic lesion at the junction of the body and tail of the pancreas, posterior aspect, Hounsfield density of 7 units (see series 2, axial images 54-56). This could be a pseudocyst, serous or mucinous cystadenoma or side branch IPMN. Other  etiologies such as malignancy are possible especially given the motion and streak artifacts through the area. MRI without and with contrast recommended when clinically feasible. There is no pancreatic ductal dilatation or other focal abnormality. Spleen: Not well seen but no obvious abnormality.  No splenomegaly. Adrenals/Urinary Tract: There is no adrenal mass. There is a 1 cm homogeneous cyst in the posterior aspect of the left kidney, Hounsfield density of 7 units. There are a few additional bilateral scattered cortical hypodensities which are too small to characterize. No follow-up imaging is recommended. For reference see JACR 2018 Feb; 264-273, Management of the Incidental Renal Mass on CT, [employer] 2021; 814-848, Bosniak  Classification of Cystic Renal Masses, Version 2019. There is bilateral mild renal cortical thinning. There are punctate nonobstructive caliceal stones posteriorly in left kidney. No nephrolithiasis is seen on the right. No hydronephrosis or ureteral stones. The bladder is catheterized but despite this is still distended with air and fluid. Clinical correlation recommended for catheter dysfunction. There is mild bladder thickening versus underdistention. Stomach/Bowel: The patient appears to have undergone a near total colectomy. The rectum remains and is sutured closed in the presacral space, with mild rectal wall thickening and perirectal stranding, the latter which could be congestive or related to fluid overload or proctitis. 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 A pigtail catheter is noted entering the lateral right mid abdominal wall with the pigtail within a right mid abdominal small bowel segment. There are a few slightly dilated left mid abdominal small bowel segments, maximum caliber 2.9 cm, unknown if this is due to partial small bowel obstruction or ileus but there is no bowel dilatation elsewhere in the abdomen or pelvis. A transitional segment could not be found. Vascular/Lymphatic: There is diffuse mesenteric haziness. Unclear whether related to fluid overload, congestive, malnutrition or hepatic dysfunction. The abdominal aorta is unremarkable. There are patchy calcific plaques in the common iliac and internal iliac arteries. The portal vein is normal caliber and patent. Accounting for patient motion no enlarged lymph nodes are seen throughout. Reproductive: The prostate is not well seen due to streak artifact from left hip replacement, but transversely it measures enlarged at 5.4 cm. Seminal vesicles are normal in size. Both testicles are in the scrotal sac and  there are small scrotal hydroceles. Other: Minimal abdominal and pelvic ascites. There is no free air, abscess or free hemorrhage. Presence of diffuse mesenteric edema limits assessment for focal inflammatory change. There has been interval weight loss since 07/06/2019 with reduction in the intra-abdominal and subcutaneous body fat stores. There is mild body wall anasarca. Musculoskeletal: Left THA. Moderately advanced right hip DJD. There is dextroscoliosis and advanced degenerative change of the lumbar spine. Advanced facet hypertrophy L3-4 down is noted with degenerative grade 1 anterolisthesis at L4-5 and L5-S1 and severe acquired foraminal and spinal canal stenosis at both levels. 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 There is no destructive bone lesion. IMPRESSION: 1. Left lower lobe pneumonia or aspiration with small layering parapneumonic effusion. 2. Aortic and coronary artery atherosclerosis. No thoracic or abdominal aortic aneurysm. 3. Aortic valve leaflet calcifications. Echocardiography may be helpful to assess for aortic stenosis. 4. Diffuse mesenteric haziness/edema. Unclear whether this is related to fluid overload, congestive, malnutrition or hepatic dysfunction. Only minimal ascites. No free air. 5. Cholelithiasis without evidence of cholecystitis. 6. Nonobstructive micronephrolithiasis. 7. 2 cm cystic lesion  at the junction of the body and tail of the pancreas. MRI without and with contrast recommended when clinically feasible. Benign and malignant etiologies are possible. 8. 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 No parastomal hernia. 9. Slightly dilated left mid abdominal small bowel segments, unknown if this is due to partial SBO or ileus. 10. The rectum remains, and there is rectal thickening and surrounding stranding which could be congestive or due to proctitis. 11. Thickened stomach and scattered mid to lower abdominal small bowel segments which could  be due to gastroenteritis or congestive. 12. Bladder catheterized but despite this is still distended with air and fluid. Clinical correlation recommended for catheter dysfunction. Cystitis versus bladder nondistention. 13. Prostatomegaly. 14. 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 15. Remaining findings described above. Electronically Signed   By: Telford Nab M.D.   On: 07/07/2022 20:41   CT Head Wo Contrast  Result Date: 07/07/2022 CLINICAL DATA:  Mental status change. EXAM: CT HEAD WITHOUT CONTRAST TECHNIQUE: Contiguous axial images were obtained from the base of the skull through the vertex without intravenous contrast. RADIATION DOSE REDUCTION: This exam was performed according to the departmental dose-optimization program which includes automated exposure control, adjustment of the mA and/or kV according to patient size and/or use of iterative reconstruction technique. COMPARISON:  Jan 11, 2022 CT scan the brain FINDINGS: Brain: Ventricles and sulci are prominent but stable. No mass effect or midline shift. No cyst subdural, epidural, or subarachnoid hemorrhage. Cerebellum, brainstem, and basal cisterns are normal. Scattered white matter changes are identified. No acute cortical ischemia or infarct. Vascular: No hyperdense vessel or unexpected calcification. Skull: Normal. Negative for fracture or focal lesion. Sinuses/Orbits: No acute finding. Other: None. IMPRESSION: Chronic white matter changes. No acute intracranial abnormalities. Electronically Signed   By: Dorise Bullion III M.D.   On: 07/07/2022 17:18   DG Chest Portable 1 View  Result Date: 07/07/2022 CLINICAL DATA:  Shortness of breath and tachycardia. EXAM: PORTABLE CHEST 1 VIEW COMPARISON:  07/06/2019 FINDINGS: The cardiomediastinal silhouette is unremarkable. Elevation of the LEFT hemidiaphragm with mild LEFT basilar atelectasis/scarring again  noted. There is no evidence of focal airspace disease, pulmonary edema, suspicious pulmonary nodule/mass, pleural effusion, or pneumothorax. No acute bony abnormalities are identified. IMPRESSION: No active disease. Electronically Signed   By: Margarette Canada M.D.   On: 07/07/2022 16:37            LOS: 3 days        Emeterio Reeve, DO [facility] 07/10/2022, 5:01 PM    Dictation software may have been used to generate the above note. Typos may occur and escape review in typed/dictated notes. Please contact Dr Sheppard Coil directly for clarity if needed.  Staff may message me via secure chat in [place]  but this may not receive an immediate response,  please page me for urgent matters!  If 7PM-7AM, please contact night coverage [email or web address]

## 2022-07-10 NOTE — Plan of Care (Signed)
  Problem: Clinical Measurements: Goal: Diagnostic test results will improve Outcome: Progressing   Problem: Respiratory: Goal: Ability to maintain adequate ventilation will improve Outcome: Progressing   Problem: Respiratory: Goal: Ability to maintain adequate ventilation will improve Outcome: Progressing   Problem: Coping: Goal: Will verbalize positive feelings about self Outcome: Progressing

## 2022-07-10 NOTE — Progress Notes (Signed)
Physical Therapy Treatment Patient Details Name: Carl Figueroa MRN: [id number] DOB: [birth year] Today's Date: 07/10/2022   History of Present Illness Pt is a 76 y/o M admitted on 07/07/22. Pt's wife reports pt had 2 staring episodes following d/c home from [place] where pt was unresponsive to his name during the episode & wife was concerned about a seizure. Pt is being treated for LLL PNA & severe sepsis. PMH: HTN, lewy body dementia, CKD3A, 1 month admission at the [place] with d/c on 07/06/22 following tx for appendicitis & intraabdominal abscesses where he underwent laparotomy with colectomy & ileostomy placement, anxiety    PT Comments    Modified session this date with focus on B LE AAROM and repositioning to upright long-sitting in bed. Pt pleasantly confused, experiencing visual and auditory hallucinations, and only oriented to self.  Continue to recommend SNF at d/c. Continue PT per POC.    Recommendations for follow up therapy are one component of a multi-disciplinary discharge planning process, led by the attending physician.  Recommendations may be updated based on patient status, additional functional criteria and insurance authorization.  Follow Up Recommendations  Skilled nursing-short term rehab (<3 hours/day) Can patient physically be transported by private vehicle: No   Assistance Recommended at Discharge Frequent or constant Supervision/Assistance  Patient can return home with the following Two people to help with walking and/or transfers;Two people to help with bathing/dressing/bathroom;Help with stairs or ramp for entrance;Direct supervision/assist for medications management;Assistance with feeding;Assist for transportation;Assistance with cooking/housework;Direct supervision/assist for financial management   Equipment Recommendations  None recommended by PT (TBD at next facility)    Recommendations for Other Services       Precautions / Restrictions  Precautions Precautions: Fall Precaution Comments: ostomy bag, JP drain Restrictions Weight Bearing Restrictions: No     Mobility  Bed Mobility Overal bed mobility: Needs Assistance Bed Mobility: Rolling Rolling: Mod assist         General bed mobility comments:  (Pt with B hand mits on)    Transfers                   General transfer comment:  (deferred due to increased confusion)    Ambulation/Gait                   Stairs             Wheelchair Mobility    Modified Rankin (Stroke Patients Only)       Balance                                            Cognition Arousal/Alertness: Awake/alert Behavior During Therapy: Flat affect Overall Cognitive Status: Impaired/Different from baseline Area of Impairment: Orientation, Attention, Memory, Following commands, Safety/judgement, Awareness, Problem solving                 Orientation Level: Disoriented to, Place, Time, Situation Current Attention Level: Focused Memory: Decreased short-term memory, Decreased recall of precautions         General Comments: Positive visual and auditory hallucinations at time of visit        Exercises General Exercises - Lower Extremity Ankle Circles/Pumps: AAROM, Both, 5 reps Heel Slides: AAROM, Both, 5 reps Hip ABduction/ADduction: AAROM, Both, 5 reps    General Comments General comments (skin integrity, edema, etc.):  (bilateral hand mits on, pt unable to tolerate  OOB activity this visit)      Pertinent Vitals/Pain Pain Assessment Pain Assessment: No/denies pain    Home Living                          Prior Function            PT Goals (current goals can now be found in the care plan section) Acute Rehab PT Goals Patient Stated Goal: to get better    Frequency    Min 2X/week      PT Plan      Co-evaluation PT/OT/SLP Co-Evaluation/Treatment: Yes            AM-PAC PT "6 Clicks"  Mobility   Outcome Measure  Help needed turning from your back to your side while in a flat bed without using bedrails?: A Lot Help needed moving from lying on your back to sitting on the side of a flat bed without using bedrails?: A Lot Help needed moving to and from a bed to a chair (including a wheelchair)?: Total Help needed standing up from a chair using your arms (e.g., wheelchair or bedside chair)?: Total Help needed to walk in hospital room?: Total Help needed climbing 3-5 steps with a railing? : Total 6 Click Score: 8    End of Session Equipment Utilized During Treatment: Oxygen Activity Tolerance: Treatment limited secondary to medical complications (Comment) (Difficulty following commands, increased confusion noted) Patient left: in bed;with bed alarm set;with call bell/phone within reach Nurse Communication: Mobility status PT Visit Diagnosis: Muscle weakness (generalized) (M62.81);Difficulty in walking, not elsewhere classified (R26.2);Other abnormalities of gait and mobility (R26.89)     Time: 1300-1311 PT Time Calculation (min) (ACUTE ONLY): 11 min  Charges:  $Therapeutic Activity: 8-22 mins                    Zadie Cleverly, PTA    Jannet Askew 07/10/2022, 1:38 PM

## 2022-07-11 DIAGNOSIS — R6520 Severe sepsis without septic shock: Secondary | ICD-10-CM | POA: Diagnosis not present

## 2022-07-11 DIAGNOSIS — A419 Sepsis, unspecified organism: Secondary | ICD-10-CM | POA: Diagnosis not present

## 2022-07-11 MED ORDER — SODIUM CHLORIDE 0.45 % IV SOLN: INTRAVENOUS | Status: DC

## 2022-07-11 MED ORDER — CHLORHEXIDINE GLUCONATE CLOTH 2 % EX PADS
6.0000 | MEDICATED_PAD | Freq: Every day | CUTANEOUS | Status: DC
  Administered 2022-07-11 – 2022-07-17 (×8): 6 via TOPICAL

## 2022-07-11 NOTE — Progress Notes (Signed)
Occupational Therapy Treatment Patient Details Name: Carl Figueroa MRN: [id number] DOB: Jan 25, 1946 Today's Date: 07/11/2022   History of present illness Pt is a 76 y/o M admitted on 07/07/22. Pt's wife reports pt had 2 staring episodes following d/c home from [place] where pt was unresponsive to his name during the episode & wife was concerned about a seizure. Pt is being treated for LLL PNA & severe sepsis. PMH: HTN, lewy body dementia, CKD3A, 1 month admission at the [place] with d/c on 07/06/22 following tx for appendicitis & intraabdominal abscesses where he underwent laparotomy with colectomy & ileostomy placement, anxiety   OT comments  Chart reviewed, RN cleared pt for participation in OT tx session. Pt is oriented to self only, however does remember this therapist from previous tx sessions. Tx session targeted improving functional activity tolerance to improve ADL task completion. Improvements noted in bed mobility requiring MOD-MAX A, pt able to sit on edge of bed with CGA-MIN A for approx 15 minutes. STS with MAX A 2 attempts with pt unable to maintain standing for any length of time. MAX A continues to be required for ADL tasks. Pt is left on L side for pressure relief. All needs met.OT will continue to follow acutely.    Recommendations for follow up therapy are one component of a multi-disciplinary discharge planning process, led by the attending physician.  Recommendations may be updated based on patient status, additional functional criteria and insurance authorization.    Follow Up Recommendations  Skilled nursing-short term rehab (<3 hours/day)     Assistance Recommended at Discharge Frequent or constant Supervision/Assistance  Patient can return home with the following  Two people to help with walking and/or transfers;Two people to help with bathing/dressing/bathroom   Equipment Recommendations  Other (comment) (defer)    Recommendations for Other Services      Precautions /  Restrictions Precautions Precautions: Fall Precaution Comments: ostomy bag, JP drain, foley Restrictions Weight Bearing Restrictions: No       Mobility Bed Mobility Overal bed mobility: Needs Assistance Bed Mobility: Rolling, Supine to Sit, Sit to Supine Rolling: Mod assist (for pressure relief)   Supine to sit: Mod assist, Max assist (able to assit by bringing BLE towards edge) Sit to supine: Max assist        Transfers Overall transfer level: Needs assistance Equipment used: 1 person hand held assist Transfers: Sit to/from Stand Sit to Stand: Max assist           General transfer comment: able to lift buttocks off bed 2 attempts approx 6 inches, unable to maintain standing for any length of time without MAX A     Balance Overall balance assessment: Needs assistance Sitting-balance support: Feet unsupported, Bilateral upper extremity supported Sitting balance-Leahy Scale: Fair Sitting balance - Comments: CGA-MIN A throughout     Standing balance-Leahy Scale: Zero                             ADL either performed or assessed with clinical judgement   ADL Overall ADL's : Needs assistance/impaired                     Lower Body Dressing: Maximal assistance                      Extremity/Trunk Assessment              Vision  Perception     Praxis      Cognition Arousal/Alertness: Awake/alert Behavior During Therapy: Flat affect   Area of Impairment: Orientation, Attention, Memory, Following commands, Safety/judgement, Awareness, Problem solving                 Orientation Level: Disoriented to, Place, Time, Situation Current Attention Level: Focused Memory: Decreased short-term memory, Decreased recall of precautions Following Commands: Follows one step commands with increased time Safety/Judgement: Decreased awareness of safety, Decreased awareness of deficits Awareness: Intellectual Problem Solving:  Slow processing, Decreased initiation, Difficulty sequencing, Requires verbal cues, Requires tactile cues General Comments: recalls this therapist from pervious visits        Exercises      Shoulder Instructions       General Comments removed L mitt with pt not pulling at lines/leads during tx session, B mitts applied at end of session    Pertinent Vitals/ Pain       Pain Assessment Pain Assessment: No/denies pain  Home Living                                          Prior Functioning/Environment              Frequency  Min 2X/week        Progress Toward Goals  OT Goals(current goals can now be found in the care plan section)  Progress towards OT goals: Progressing toward goals     Plan Discharge plan remains appropriate    Co-evaluation                 AM-PAC OT "6 Clicks" Daily Activity     Outcome Measure   Help from another person eating meals?: A Lot Help from another person taking care of personal grooming?: A Lot Help from another person toileting, which includes using toliet, bedpan, or urinal?: Total Help from another person bathing (including washing, rinsing, drying)?: Total Help from another person to put on and taking off regular upper body clothing?: A Lot Help from another person to put on and taking off regular lower body clothing?: A Lot 6 Click Score: 10    End of Session Equipment Utilized During Treatment: Oxygen  OT Visit Diagnosis: Unsteadiness on feet (R26.81);Muscle weakness (generalized) (M62.81)   Activity Tolerance Patient tolerated treatment well   Patient Left in bed;with call bell/phone within reach;with bed alarm set   Nurse Communication Mobility status        Time: 5643-3295 OT Time Calculation (min): 18 min  Charges: OT General Charges $OT Visit: 1 Visit OT Treatments $Therapeutic Activity: 8-22 mins  Shanon Payor, OTD OTR/L  07/11/22, 11:44 AM

## 2022-07-11 NOTE — Progress Notes (Signed)
PROGRESS NOTE  Carl Figueroa  [id number] DOB: [birth year] DOA: 07/07/2022 PCP: Leonel Ramsay, MD   Brief Narrative: Patient is a 76 year old with history of hypertension, dementia  CKD 3a, who lives in a nursing facility , recently discharged on 11/17 from a 1 month admission at the [place] for appendicitis and intra-abdominal abscesses where he underwent laparotomy with  colectomy and ileostomy placement and discharged with Foley catheter and Augmentin and Zyvox until 12/17(discharge summary unavailable), who was brought to the ED for staring episode x2 since arrival home, once on the day of discharge on 11/17 and another on 11/18.  He was unresponsive to his name during the episode.  Wife was concerned about a seizure and called EMS .  On presentation he was hypotensive but responded to IV fluids.  Lab work showed leukocytosis, lactic acidosis, AKI.  CT head did not show any acute findings.  CT chest/abdomen/pelvis showed left lower lobe pneumonia, possible aspiration, diffuse mesenteric haziness.  Patient was started on broad-spectrum antibiotics for the suspicion of severe sepsis secondary to left lower lobe pneumonia.  Hospital course also remarkable for confusion, agitation but now has improved.  PT/OT recommending SNF.  Antibiotics has been changed to oral.  He is medically stable to skilled nursing facility whenever possible.  As per [facility], it will happen on Friday.   Assessment & Plan:  Principal Problem:   Severe sepsis ([place]) Active Problems:   Left lower lobe pneumonia   Foley catheter in place on admission   Acute metabolic encephalopathy   Staring episodes   Acute renal failure superimposed on stage 3a chronic kidney disease ([place])   S/P laparotomy   Long term (current) use of antibiotics   Hypotension   Colostomy present (HCC)   Hyperkalemia   Essential hypertension   Dementia without behavioral disturbance (HCC)  Severe sepsis: Presented with hypotension,  tachycardia, leukocytosis, lactic acidosis, AKI, encephalopathy.  Likely source is left lower lobe pneumonia.  Sepsis physiology has significantly improved.  Cultures have been negative. Initially started on IV antibiotics, now changed to Augmentin.  Plan to continue Augmentin for   total 7 days of abx including IV Leukocytosis has resolved.  Left lower lobe pneumonia: As seen on the CT.  Respiratory status has improved and he is on 2-3 L.  Continue antibiotics to finish the course.  Can wean the oxygen whenever possible  Encephalopathy/staring episodes/history of dementia: CT head negative.  EEG showed moderate to severe diffuse slowing indicative of global cerebral dysfunction, no epileptiform abnormality seen. Mental status has improved and now close to baseline.  He has history of dementia/short-term memory problem..  Continue delirium precautions, frequent reorientation.  Needs assistance with feeding, dressing. He is oriented to place, answers questions, obeys commands but not oriented to time. PT/OT recommended SNF  Status post ostomy: He underwent laparotomy with near-total colectomy on 05/30/2022 for appendicitis with intra-abdominal abscess, bowel perforation. [name] has right lower  quadrant drain He follows with the [place]. General surgery was also consulted here for concern of infection from intra-abdominal source which has been ruled out.He is making solid stool in the colostomy bag.[facility]  Chronic urinary retention: He had Foley when on admission, it was put in [place] [facility] when he was admitted.  Continue on dc.  Follow-up with urology as outpatient.He has an appointment with [facility] in [place] next week as per wife.  On Proscar, Flomax  Hypertension: Takes lisinopril at home which is  on hold due to finding of AKI on presentation.  He is normotensive without any medications  AKI: Resolved  Normocytic anemia: Likely chronic, hemoglobin stable  in the range of 8  Hyperkalemia/hypernatremia: Resolved.  Check BMP tomorrow.  We will start on gentle hypotonic fluids      Pressure Injury 07/08/22 Coccyx Bilateral Stage 2 -  Partial thickness loss of dermis presenting as a shallow open injury with a red, pink wound bed without slough. (Active)  07/08/22 1534  Location: Coccyx  Location Orientation: Bilateral  Staging: Stage 2 -  Partial thickness loss of dermis presenting as a shallow open injury with a red, pink wound bed without slough.  Wound Description (Comments):   Present on Admission: Yes  Dressing Type Foam - Lift dressing to assess site every shift 07/11/22 0031    DVT prophylaxis:enoxaparin (LOVENOX) injection 40 mg Start: 07/07/22 2230     Code Status: Full Code  Family Communication: None at bedside  Patient status:Inpatient  Patient is from :SNF  Anticipated discharge to:SNF  Estimated DC date:on Friday   Consultants: Surgery  Procedures:None  Antimicrobials:  Anti-infectives (From admission, onward)    Start     Dose/Rate Route Frequency Ordered Stop   07/10/22 2200  amoxicillin-clavulanate (AUGMENTIN) 875-125 MG per tablet 1 tablet        1 tablet Oral Every 12 hours 07/10/22 1610     07/09/22 2100  vancomycin (VANCOCIN) IVPB 1000 mg/200 mL premix        1,000 mg 200 mL/hr over 60 Minutes Intravenous Every 24 hours 07/09/22 0727 07/10/22 2200   07/08/22 2100  vancomycin (VANCOREADY) IVPB 750 mg/150 mL  Status:  Discontinued        750 mg 150 mL/hr over 60 Minutes Intravenous Every 24 hours 07/07/22 2248 07/09/22 0727   07/08/22 2000  Ampicillin-Sulbactam (UNASYN) 3 g in sodium chloride 0.9 % 100 mL IVPB  Status:  Discontinued        3 g 200 mL/hr over 30 Minutes Intravenous Every 6 hours 07/08/22 1422 07/10/22 1610   07/08/22 0600  Ampicillin-Sulbactam (UNASYN) 3 g in sodium chloride 0.9 % 100 mL IVPB  Status:  Discontinued        3 g 200 mL/hr over 30 Minutes Intravenous Every 8 hours  07/07/22 2240 07/08/22 1422   07/07/22 1845  vancomycin (VANCOREADY) IVPB 1250 mg/250 mL        1,250 mg 166.7 mL/hr over 90 Minutes Intravenous  Once 07/07/22 1833 07/07/22 2237   07/07/22 1830  ceFEPIme (MAXIPIME) 2 g in sodium chloride 0.9 % 100 mL IVPB        2 g 200 mL/hr over 30 Minutes Intravenous  Once 07/07/22 1827 07/07/22 2033   07/07/22 1830  metroNIDAZOLE (FLAGYL) IVPB 500 mg        500 mg 100 mL/hr over 60 Minutes Intravenous  Once 07/07/22 1827 07/07/22 2057   07/07/22 1830  vancomycin (VANCOCIN) IVPB 1000 mg/200 mL premix  Status:  Discontinued        1,000 mg 200 mL/hr over 60 Minutes Intravenous  Once 07/07/22 1827 07/07/22 1833       Subjective:  Patient seen and examined at bedside today.  Very deconditioned, weak, lying in bed.  When I arrived, he looked comfortable, eyes open, alert, awake and states he is fine.  He denies any worsening shortness of breath or cough.  No abdominal pain. He was not aware about the month or day.  Noted that in  the hospital.  Very slow but responsive.    Objective: Vitals:   07/10/22 0814 07/10/22 1538 07/11/22 0012 07/11/22 0748  BP: (!) 129/95 119/60 135/74 134/74  Pulse: 85 92 81 78  Resp: 17 18 17 18   Temp: 98.3 F (36.8 C) 98.2 F (36.8 C) 98.4 F (36.9 C) 98 F (36.7 C)  TempSrc:      SpO2: 98% 99% 100% 100%  Weight:      Height:        Intake/Output Summary (Last 24 hours) at 07/11/2022 1114 Last data filed at 07/11/2022 0900 Gross per 24 hour  Intake 200 ml  Output 1950 ml  Net -1750 ml   Filed Weights   07/07/22 1626 07/09/22 2209  Weight: 68 kg 55.7 kg    Examination:  General exam: Overall comfortable, not in distress, chronically ill looking, weak, deconditioned HEENT: PERRL Respiratory system: Diminished sounds bilaterally on bases, no wheezes or crackles  Cardiovascular system: S1 & S2 heard, RRR.  Gastrointestinal system: Abdomen is nondistended, soft and nontender.  Ostomy with stool, right  lower quadrant drain Central nervous system: Alert and awake, follows commands, oriented to place Extremities: No edema, no clubbing ,no cyanosis Skin: No rashes, no ulcers,no icterus   GU: Foley   Data Reviewed: I have personally reviewed following labs and imaging studies  CBC: Recent Labs  Lab 07/07/22 1633 07/08/22 0319 07/09/22 0231 07/10/22 0556  WBC 15.4* 12.4* 11.5* 9.0  NEUTROABS 13.4*  --   --   --   HGB 9.4* 7.5* 8.3* 8.1*  HCT 29.3* 23.3* 25.4* 24.7*  MCV 86.7 86.0 86.1 86.1  PLT 403* 302 261 [id number]   Basic Metabolic Panel: Recent Labs  Lab 07/07/22 1633 07/08/22 0319 07/08/22 0840 07/08/22 1914 07/08/22 2257 07/09/22 0231 07/09/22 0630 07/10/22 0556  NA 143 144  --   --   --  145  --  147*  K 5.8* 5.5*   < > 4.9 4.7 4.7 4.5 4.0  CL 111 115*  --   --   --  114*  --  115*  CO2 22 25  --   --   --  26  --  27  GLUCOSE 143* 104*  --   --   --  79  --  98  BUN 24* 23  --   --   --  20  --  16  CREATININE 1.79* 1.52*  --   --   --  1.40*  --  1.14  CALCIUM 10.7* 10.1  --   --   --  10.1  --  10.0   < > = values in this interval not displayed.     Recent Results (from the past 240 hour(s))  Gastrointestinal Panel by PCR , Stool     Status: None   Collection Time: 07/07/22  4:33 PM   Specimen: Stool  Result Value Ref Range Status   Campylobacter species NOT DETECTED NOT DETECTED Final   Plesimonas shigelloides NOT DETECTED NOT DETECTED Final   Salmonella species NOT DETECTED NOT DETECTED Final   Yersinia enterocolitica NOT DETECTED NOT DETECTED Final   Vibrio species NOT DETECTED NOT DETECTED Final   Vibrio cholerae NOT DETECTED NOT DETECTED Final   Enteroaggregative E coli (EAEC) NOT DETECTED NOT DETECTED Final   Enteropathogenic E coli (EPEC) NOT DETECTED NOT DETECTED Final   Enterotoxigenic E coli (ETEC) NOT DETECTED NOT DETECTED Final   Shiga like toxin producing E coli (STEC) NOT DETECTED  NOT DETECTED Final   Shigella/Enteroinvasive E coli (EIEC)  NOT DETECTED NOT DETECTED Final   Cryptosporidium NOT DETECTED NOT DETECTED Final   Cyclospora cayetanensis NOT DETECTED NOT DETECTED Final   Entamoeba histolytica NOT DETECTED NOT DETECTED Final   Giardia lamblia NOT DETECTED NOT DETECTED Final   Adenovirus F40/41 NOT DETECTED NOT DETECTED Final   Astrovirus NOT DETECTED NOT DETECTED Final   Norovirus GI/GII NOT DETECTED NOT DETECTED Final   Rotavirus A NOT DETECTED NOT DETECTED Final   Sapovirus (I, II, IV, and V) NOT DETECTED NOT DETECTED Final    Comment: Performed at [facility], [address] [place], [place] [address]  C Difficile Quick Screen w PCR reflex     Status: None   Collection Time: 07/07/22  4:33 PM   Specimen: Stool  Result Value Ref Range Status   C Diff antigen NEGATIVE NEGATIVE Final   C Diff toxin NEGATIVE NEGATIVE Final   C Diff interpretation No C. difficile detected.  Final    Comment: Performed at [facility], [address] [place], [place] [address]  Culture, blood (single)     Status: None (Preliminary result)   Collection Time: 07/07/22  6:34 PM   Specimen: BLOOD RIGHT ARM  Result Value Ref Range Status   Specimen Description BLOOD RIGHT ARM  Final   Special Requests   Final    BOTTLES DRAWN AEROBIC AND ANAEROBIC Blood Culture results may not be optimal due to an inadequate volume of blood received in culture bottles   Culture   Final    NO GROWTH 4 DAYS Performed at [facility], [address] [place], [place] [address]    Report Status PENDING  Incomplete  MRSA Next Gen by PCR, Nasal     Status: None   Collection Time: 07/09/22  3:49 PM   Specimen: Nasal Mucosa; Nasal Swab  Result Value Ref Range Status   MRSA by PCR Next Gen NOT DETECTED NOT DETECTED Final    Comment: (NOTE) The GeneXpert MRSA Assay (FDA approved for NASAL specimens only), is one component of a comprehensive MRSA colonization surveillance program. It is not intended to diagnose MRSA infection  nor to guide or monitor treatment for MRSA infections. Test performance is not FDA approved in patients less than 46 years old. Performed at [facility], [address], [place], [place] [address]      Radiology Studies: EEG adult now  Result Date: 07/10/2022 Jefferson Fuel, MD     07/10/2022  8:27 AM Routine EEG Report Quinterious Wallice Granville is a 76 y.o. male with a history of staring spells concerning for seizure who is undergoing an EEG to evaluate for seizures. Report: This EEG was acquired with electrodes placed according to the International 10-20 electrode system (including Fp1, Fp2, F3, F4, C3, C4, P3, P4, O1, O2, T3, T4, T5, T6, A1, A2, Fz, Cz, Pz). The following electrodes were missing or displaced: none. The occipital dominant rhythm was 5-6 Hz. This activity is reactive to stimulation. Drowsiness was manifested by background fragmentation; deeper stages of sleep were not identified. There was bifrontal slowing and intermittent periods lasting 1-2 seconds of diffuse delta slowing. There were no interictal epileptiform discharges. There were no electrographic seizures identified. There was no abnormal response to photic stimulation or hyperventilation. Impression and clinical correlation: This EEG was obtained while awake and drowsy and is abnormal due to: - moderate to severe diffuse slowing indicative of global cerebral dysfunction - bifrontal  focal slowing indicative of focal cerebral dysfunction in these regions Epileptiform abnormalities were not seen during this recording. Su Monks, MD [facility] [phone] If 7pm- 7am, please page neurology on call as listed in [place].    Scheduled Meds:   stroke: early stages of recovery book   Does not apply Once   amoxicillin-clavulanate  1 tablet Oral Q12H   Chlorhexidine Gluconate Cloth  6 each Topical Daily   enoxaparin (LOVENOX) injection  40 mg Subcutaneous Q24H   mouth rinse  15 mL Mouth Rinse Q2H    Continuous Infusions:  sodium chloride Stopped (07/08/22 2225)     LOS: 4 days   Shelly Coss, MD [facility] P11/22/2023, 11:14 AM

## 2022-07-11 NOTE — Plan of Care (Signed)
  Problem: Fluid Volume: Goal: Hemodynamic stability will improve Outcome: Progressing   Problem: Clinical Measurements: Goal: Diagnostic test results will improve Outcome: Progressing Goal: Signs and symptoms of infection will decrease Outcome: Progressing   Problem: Respiratory: Goal: Ability to maintain adequate ventilation will improve Outcome: Progressing   Problem: Education: Goal: Knowledge of disease or condition will improve Outcome: Progressing Goal: Knowledge of secondary prevention will improve (MUST DOCUMENT ALL) Outcome: Progressing Goal: Knowledge of patient specific risk factors will improve Loraine Leriche N/A or DELETE if not current risk factor) Outcome: Progressing   Problem: Ischemic Stroke/TIA Tissue Perfusion: Goal: Complications of ischemic stroke/TIA will be minimized Outcome: Progressing   Problem: Coping: Goal: Will verbalize positive feelings about self Outcome: Progressing Goal: Will identify appropriate support needs Outcome: Progressing   Problem: Health Behavior/Discharge Planning: Goal: Ability to manage health-related needs will improve Outcome: Progressing Goal: Goals will be collaboratively established with patient/family Outcome: Progressing   Problem: Self-Care: Goal: Ability to participate in self-care as condition permits will improve Outcome: Progressing Goal: Verbalization of feelings and concerns over difficulty with self-care will improve Outcome: Progressing Goal: Ability to communicate needs accurately will improve Outcome: Progressing   Problem: Nutrition: Goal: Risk of aspiration will decrease Outcome: Progressing Goal: Dietary intake will improve Outcome: Progressing   Problem: Education: Goal: Knowledge of General Education information will improve Description: Including pain rating scale, medication(s)/side effects and non-pharmacologic comfort measures Outcome: Progressing   Problem: Health Behavior/Discharge  Planning: Goal: Ability to manage health-related needs will improve Outcome: Progressing   Problem: Clinical Measurements: Goal: Ability to maintain clinical measurements within normal limits will improve Outcome: Progressing Goal: Will remain free from infection Outcome: Progressing Goal: Diagnostic test results will improve Outcome: Progressing Goal: Respiratory complications will improve Outcome: Progressing Goal: Cardiovascular complication will be avoided Outcome: Progressing   Problem: Activity: Goal: Risk for activity intolerance will decrease Outcome: Progressing   Problem: Nutrition: Goal: Adequate nutrition will be maintained Outcome: Progressing   Problem: Coping: Goal: Level of anxiety will decrease Outcome: Progressing   Problem: Elimination: Goal: Will not experience complications related to bowel motility Outcome: Progressing Goal: Will not experience complications related to urinary retention Outcome: Progressing   Problem: Pain Managment: Goal: General experience of comfort will improve Outcome: Progressing   Problem: Skin Integrity: Goal: Risk for impaired skin integrity will decrease Outcome: Progressing

## 2022-07-12 DIAGNOSIS — R6520 Severe sepsis without septic shock: Secondary | ICD-10-CM | POA: Diagnosis not present

## 2022-07-12 DIAGNOSIS — A419 Sepsis, unspecified organism: Secondary | ICD-10-CM | POA: Diagnosis not present

## 2022-07-12 LAB — BASIC METABOLIC PANEL
Anion gap: 4 — ABNORMAL LOW (ref 5–15)
BUN: 12 mg/dL (ref 8–23)
CO2: 24 mmol/L (ref 22–32)
Calcium: 9.8 mg/dL (ref 8.9–10.3)
Chloride: 113 mmol/L — ABNORMAL HIGH (ref 98–111)
Creatinine, Ser: 1.04 mg/dL (ref 0.61–1.24)
GFR, Estimated: >60 mL/min (ref >60)
Glucose, Bld: 87 mg/dL (ref 70–99)
Potassium: 3.9 mmol/L (ref 3.5–5.1)
Sodium: 141 mmol/L (ref 135–145)

## 2022-07-12 LAB — CULTURE, BLOOD (SINGLE): Culture: NO GROWTH

## 2022-07-12 MED ORDER — TAMSULOSIN HCL 0.4 MG PO CAPS
0.4000 mg | ORAL_CAPSULE | Freq: Every day | ORAL | Status: DC
  Administered 2022-07-12 – 2022-07-17 (×6): 0.4 mg via ORAL
  Filled 2022-07-12 (×6): qty 1

## 2022-07-12 MED ORDER — FINASTERIDE 5 MG PO TABS
5.0000 mg | ORAL_TABLET | Freq: Every day | ORAL | Status: DC
  Administered 2022-07-12 – 2022-07-17 (×6): 5 mg via ORAL
  Filled 2022-07-12 (×6): qty 1

## 2022-07-12 MED ORDER — SERTRALINE HCL 50 MG PO TABS
25.0000 mg | ORAL_TABLET | Freq: Every day | ORAL | Status: DC
  Administered 2022-07-12 – 2022-07-17 (×6): 25 mg via ORAL
  Filled 2022-07-12 (×6): qty 1

## 2022-07-12 MED ORDER — TRAZODONE HCL 50 MG PO TABS
50.0000 mg | ORAL_TABLET | Freq: Two times a day (BID) | ORAL | Status: DC
  Administered 2022-07-12 – 2022-07-16 (×8): 50 mg via ORAL
  Filled 2022-07-12 (×8): qty 1

## 2022-07-12 NOTE — Plan of Care (Signed)
  Problem: Fluid Volume: Goal: Hemodynamic stability will improve Outcome: Progressing   Problem: Clinical Measurements: Goal: Diagnostic test results will improve Outcome: Progressing Goal: Signs and symptoms of infection will decrease Outcome: Progressing   Problem: Respiratory: Goal: Ability to maintain adequate ventilation will improve Outcome: Progressing   Problem: Ischemic Stroke/TIA Tissue Perfusion: Goal: Complications of ischemic stroke/TIA will be minimized Outcome: Progressing   Problem: Health Behavior/Discharge Planning: Goal: Ability to manage health-related needs will improve Outcome: Progressing Goal: Goals will be collaboratively established with patient/family Outcome: Progressing   Problem: Self-Care: Goal: Ability to participate in self-care as condition permits will improve Outcome: Progressing Goal: Verbalization of feelings and concerns over difficulty with self-care will improve Outcome: Progressing Goal: Ability to communicate needs accurately will improve Outcome: Progressing   Problem: Education: Goal: Knowledge of General Education information will improve Description: Including pain rating scale, medication(s)/side effects and non-pharmacologic comfort measures Outcome: Progressing   Problem: Health Behavior/Discharge Planning: Goal: Ability to manage health-related needs will improve Outcome: Progressing   Problem: Clinical Measurements: Goal: Will remain free from infection Outcome: Progressing Goal: Diagnostic test results will improve Outcome: Progressing Goal: Respiratory complications will improve Outcome: Progressing   Problem: Activity: Goal: Risk for activity intolerance will decrease Outcome: Progressing   Problem: Coping: Goal: Level of anxiety will decrease Outcome: Progressing

## 2022-07-12 NOTE — Progress Notes (Signed)
PROGRESS NOTE  Carl Figueroa  WFU:[id number] DOB: 01-Nov-1945 DOA: 07/07/2022 PCP: Mick Sell, MD   Brief Narrative: Patient is a 76 year old with history of hypertension, dementia  CKD 3a, who lives in a nursing facility , recently discharged on 11/17 from a 1 month admission at the [place] for appendicitis and intra-abdominal abscesses where he underwent laparotomy with  colectomy and ileostomy placement and discharged with Foley cathete who was brought to the ED for staring episode x2 since arrival home, once on the day of discharge on 11/17 and another on 11/18.  He was unresponsive to his name during the episode.  Wife was concerned about a seizure and called EMS .  On presentation he was hypotensive but responded to IV fluids.  Lab work showed leukocytosis, lactic acidosis, AKI.  CT head did not show any acute findings.  CT chest/abdomen/pelvis showed left lower lobe pneumonia, possible aspiration, diffuse mesenteric haziness.  Patient was started on broad-spectrum antibiotics for the suspicion of severe sepsis secondary to left lower lobe pneumonia.  Hospital course also remarkable for confusion, agitation but now has improved.  PT/OT recommending SNF.  Antibiotics has been changed to oral.  He is medically stable to return to skilled nursing facility whenever possible. Discharge delayed due to Thanking Giving holidays.TOC closely following  Assessment & Plan:  Principal Problem:   Severe sepsis (HCC) Active Problems:   Left lower lobe pneumonia   Foley catheter in place on admission   Acute metabolic encephalopathy   Staring episodes   Acute renal failure superimposed on stage 3a chronic kidney disease (HCC)   S/P laparotomy   Long term (current) use of antibiotics   Hypotension   Colostomy present (HCC)   Hyperkalemia   Essential hypertension   Dementia without behavioral disturbance (HCC)  Severe sepsis: Presented with hypotension, tachycardia, leukocytosis, lactic  acidosis, AKI, encephalopathy.  Likely source was left lower lobe pneumonia.  Sepsis physiology has significantly improved.  Cultures have been negative. Initially started on IV antibiotics, now changed to Augmentin.  Plan to continue Augmentin for   total 7 days of abx including IV Leukocytosis has resolved.  Left lower lobe pneumonia: As seen on the CT.  Respiratory status has improved and he is on 2-3 L.  Continue antibiotics to finish the course.  Can wean the oxygen whenever possible at SnF  Encephalopathy/staring episodes/history of dementia: CT head negative.  EEG showed moderate to severe diffuse slowing indicative of global cerebral dysfunction, no epileptiform abnormality seen. Mental status has improved and now close to baseline.  He has history of dementia/short-term memory problem..  Continue delirium precautions, frequent reorientation.  Needs assistance with feeding, dressing. He is oriented to place, answers questions, obeys commands but not oriented to time. Lives in nursing facility.  On Seroquel, trazodone  Status post ileostomy: He underwent laparotomy with near-total colectomy on 05/30/2022 for appendicitis with intra-abdominal abscess, bowel perforation. Gaylyn Rong has right lower  quadrant drain He follows with the [place]. General surgery was also consulted here for concern of infection from intra-abdominal source which has been ruled out.He is making solid stool in the colostomy bag.[facility]   Chronic urinary retention: [facility] .  Continue on dc  Follow-up with urology as outpatient.  On Proscar, Flomax.[facility] [place] next week as per wife.   Hypertension: Takes lisinopril at home which is on hold due to finding  of AKI on presentation.  He is normotensive without any medications  AKI: Resolved  Normocytic anemia: Likely chronic, hemoglobin stable in the range of  8  Hyperkalemia/hypernatremia: Resolved.       Pressure Injury 07/08/22 Coccyx Bilateral Stage 2 -  Partial thickness loss of dermis presenting as a shallow open injury with a red, pink wound bed without slough. (Active)  07/08/22 1534  Location: Coccyx  Location Orientation: Bilateral  Staging: Stage 2 -  Partial thickness loss of dermis presenting as a shallow open injury with a red, pink wound bed without slough.  Wound Description (Comments):   Present on Admission: Yes  Dressing Type Foam - Lift dressing to assess site every shift 07/12/22 0951    DVT prophylaxis:enoxaparin (LOVENOX) injection 40 mg Start: 07/07/22 2230     Code Status: Full Code  Family Communication: Wife on phone on 11/23  Patient status:Inpatient  Patient is from :SNF  Anticipated discharge to:SNF  Estimated DC date:as per [facility]   Consultants: Surgery  Procedures:None  Antimicrobials:  Anti-infectives (From admission, onward)    Start     Dose/Rate Route Frequency Ordered Stop   07/10/22 2200  amoxicillin-clavulanate (AUGMENTIN) 875-125 MG per tablet 1 tablet        1 tablet Oral Every 12 hours 07/10/22 1610 07/13/22 2359   07/09/22 2100  vancomycin (VANCOCIN) IVPB 1000 mg/200 mL premix        1,000 mg 200 mL/hr over 60 Minutes Intravenous Every 24 hours 07/09/22 0727 07/10/22 2200   07/08/22 2100  vancomycin (VANCOREADY) IVPB 750 mg/150 mL  Status:  Discontinued        750 mg 150 mL/hr over 60 Minutes Intravenous Every 24 hours 07/07/22 2248 07/09/22 0727   07/08/22 2000  Ampicillin-Sulbactam (UNASYN) 3 g in sodium chloride 0.9 % 100 mL IVPB  Status:  Discontinued        3 g 200 mL/hr over 30 Minutes Intravenous Every 6 hours 07/08/22 1422 07/10/22 1610   07/08/22 0600  Ampicillin-Sulbactam (UNASYN) 3 g in sodium chloride 0.9 % 100 mL IVPB  Status:  Discontinued        3 g 200 mL/hr over 30 Minutes Intravenous Every 8 hours 07/07/22 2240 07/08/22 1422   07/07/22 1845  vancomycin  (VANCOREADY) IVPB 1250 mg/250 mL        1,250 mg 166.7 mL/hr over 90 Minutes Intravenous  Once 07/07/22 1833 07/07/22 2237   07/07/22 1830  ceFEPIme (MAXIPIME) 2 g in sodium chloride 0.9 % 100 mL IVPB        2 g 200 mL/hr over 30 Minutes Intravenous  Once 07/07/22 1827 07/07/22 2033   07/07/22 1830  metroNIDAZOLE (FLAGYL) IVPB 500 mg        500 mg 100 mL/hr over 60 Minutes Intravenous  Once 07/07/22 1827 07/07/22 2057   07/07/22 1830  vancomycin (VANCOCIN) IVPB 1000 mg/200 mL premix  Status:  Discontinued        1,000 mg 200 mL/hr over 60 Minutes Intravenous  Once 07/07/22 1827 07/07/22 1833       Subjective:  Patient seen and examined at bedside today.  Hemodynamically stable.  Lying in bed.  On 3 L of oxygen per minute.  Alert and awake but little slow, oriented to place only.  Not in any apparent distress.  Speaks well.No new complains  Objective: Vitals:   07/11/22 1541 07/12/22 0058 07/12/22 0100 07/12/22 0739  BP: 129/73 (!) 121/102 121/60 134/84  Pulse: 91 83 80 79  Resp: 16 16  18   Temp: 98.2 F (36.8 C) 97.6 F (36.4 C)  (!) 97.4 F (36.3 C)  TempSrc:      SpO2: 100% 100%  100%  Weight:      Height:        Intake/Output Summary (Last 24 hours) at 07/12/2022 1302 Last data filed at 07/12/2022 1000 Gross per 24 hour  Intake 1484.52 ml  Output 1960 ml  Net -475.48 ml   Filed Weights   07/07/22 1626 07/09/22 2209  Weight: 68 kg 55.7 kg    Examination:  General exam: Overall comfortable, not in distress, very deconditioned, chronically looking HEENT: PERRL Respiratory system:  no wheezes or crackles, diminished air sounds bilaterally in bases Cardiovascular system: S1 & S2 heard, RRR.  Gastrointestinal system: Abdomen is nondistended, soft and nontender.  Colostomy, right lower quadrant drain Central nervous system: Alert and awake and and oriented to place Extremities: No edema, no clubbing ,no cyanosis Skin: No rashes, no ulcers,no icterus   GU:  Foley   Data Reviewed: I have personally reviewed following labs and imaging studies  CBC: Recent Labs  Lab 07/07/22 1633 07/08/22 0319 07/09/22 0231 07/10/22 0556  WBC 15.4* 12.4* 11.5* 9.0  NEUTROABS 13.4*  --   --   --   HGB 9.4* 7.5* 8.3* 8.1*  HCT 29.3* 23.3* 25.4* 24.7*  MCV 86.7 86.0 86.1 86.1  PLT 403* 302 261 242   Basic Metabolic Panel: Recent Labs  Lab 07/07/22 1633 07/08/22 0319 07/08/22 0840 07/08/22 2257 07/09/22 0231 07/09/22 0630 07/10/22 0556 07/12/22 0532  NA 143 144  --   --  145  --  147* 141  K 5.8* 5.5*   < > 4.7 4.7 4.5 4.0 3.9  CL 111 115*  --   --  114*  --  115* 113*  CO2 22 25  --   --  26  --  27 24  GLUCOSE 143* 104*  --   --  79  --  98 87  BUN 24* 23  --   --  20  --  16 12  CREATININE 1.79* 1.52*  --   --  1.40*  --  1.14 1.04  CALCIUM 10.7* 10.1  --   --  10.1  --  10.0 9.8   < > = values in this interval not displayed.     Recent Results (from the past 240 hour(s))  Gastrointestinal Panel by PCR , Stool     Status: None   Collection Time: 07/07/22  4:33 PM   Specimen: Stool  Result Value Ref Range Status   Campylobacter species NOT DETECTED NOT DETECTED Final   Plesimonas shigelloides NOT DETECTED NOT DETECTED Final   Salmonella species NOT DETECTED NOT DETECTED Final   Yersinia enterocolitica NOT DETECTED NOT DETECTED Final   Vibrio species NOT DETECTED NOT DETECTED Final   Vibrio cholerae NOT DETECTED NOT DETECTED Final   Enteroaggregative E coli (EAEC) NOT DETECTED NOT DETECTED Final   Enteropathogenic E coli (EPEC) NOT DETECTED NOT DETECTED Final   Enterotoxigenic E coli (ETEC) NOT DETECTED NOT DETECTED Final   Shiga like toxin producing E coli (STEC) NOT DETECTED NOT DETECTED Final   Shigella/Enteroinvasive E coli (EIEC) NOT DETECTED NOT DETECTED Final   Cryptosporidium NOT DETECTED NOT DETECTED Final   Cyclospora cayetanensis NOT DETECTED NOT DETECTED Final   Entamoeba histolytica NOT DETECTED NOT DETECTED Final    Giardia lamblia NOT DETECTED NOT DETECTED Final   Adenovirus F40/41  NOT DETECTED NOT DETECTED Final   Astrovirus NOT DETECTED NOT DETECTED Final   Norovirus GI/GII NOT DETECTED NOT DETECTED Final   Rotavirus A NOT DETECTED NOT DETECTED Final   Sapovirus (I, II, IV, and V) NOT DETECTED NOT DETECTED Final    Comment: Performed at [facility], [address] [place], [place] [address]  C Difficile Quick Screen w PCR reflex     Status: None   Collection Time: 07/07/22  4:33 PM   Specimen: Stool  Result Value Ref Range Status   C Diff antigen NEGATIVE NEGATIVE Final   C Diff toxin NEGATIVE NEGATIVE Final   C Diff interpretation No C. difficile detected.  Final    Comment: Performed at [facility], [address] [place], [place] [address]  Culture, blood (single)     Status: None   Collection Time: 07/07/22  6:34 PM   Specimen: BLOOD RIGHT ARM  Result Value Ref Range Status   Specimen Description BLOOD RIGHT ARM  Final   Special Requests   Final    BOTTLES DRAWN AEROBIC AND ANAEROBIC Blood Culture results may not be optimal due to an inadequate volume of blood received in culture bottles   Culture   Final    NO GROWTH 5 DAYS Performed at [facility], [address] [place], [place] [address]    Report Status 07/12/2022 FINAL  Final  MRSA Next Gen by PCR, Nasal     Status: None   Collection Time: 07/09/22  3:49 PM   Specimen: Nasal Mucosa; Nasal Swab  Result Value Ref Range Status   MRSA by PCR Next Gen NOT DETECTED NOT DETECTED Final    Comment: (NOTE) The GeneXpert MRSA Assay (FDA approved for NASAL specimens only), is one component of a comprehensive MRSA colonization surveillance program. It is not intended to diagnose MRSA infection nor to guide or monitor treatment for MRSA infections. Test performance is not FDA approved in patients less than 9 years old. Performed at [facility], [address] [place], [place] [address]       Radiology Studies: No results found.  Scheduled Meds:   stroke: early stages of recovery book   Does not apply Once   amoxicillin-clavulanate  1 tablet Oral Q12H   Chlorhexidine Gluconate Cloth  6 each Topical Daily   enoxaparin (LOVENOX) injection  40 mg Subcutaneous Q24H   mouth rinse  15 mL Mouth Rinse Q2H   Continuous Infusions:  sodium chloride 75 mL/hr at 07/12/22 1950   sodium chloride Stopped (07/08/22 2225)     LOS: 5 days   Burnadette Pop, MD [facility] P11/23/2023, 1:02 PM

## 2022-07-13 ENCOUNTER — Inpatient Hospital Stay: Payer: Medicare Other

## 2022-07-13 DIAGNOSIS — R6520 Severe sepsis without septic shock: Secondary | ICD-10-CM | POA: Diagnosis not present

## 2022-07-13 DIAGNOSIS — A419 Sepsis, unspecified organism: Secondary | ICD-10-CM | POA: Diagnosis not present

## 2022-07-13 MED ORDER — IOHEXOL 350 MG/ML SOLN
75.0000 mL | Freq: Once | INTRAVENOUS | Status: AC | PRN
  Administered 2022-07-13: 75 mL via INTRAVENOUS

## 2022-07-13 MED ORDER — GUAIFENESIN 100 MG/5ML PO LIQD
5.0000 mL | Freq: Once | ORAL | Status: AC
  Administered 2022-07-13: 5 mL via ORAL
  Filled 2022-07-13: qty 5
  Filled 2022-07-13 (×2): qty 10

## 2022-07-13 MED ORDER — IOHEXOL 9 MG/ML PO SOLN
500.0000 mL | ORAL | Status: AC
  Administered 2022-07-13: 500 mL via ORAL

## 2022-07-13 MED ORDER — SODIUM CHLORIDE 0.9 % IV SOLN: INTRAVENOUS | Status: AC

## 2022-07-13 NOTE — Progress Notes (Signed)
Physical Therapy Treatment Patient Details Name: Carl Figueroa MRN: [id number] DOB: [birth year] Today's Date: 07/13/2022   History of Present Illness Pt is a 76 y/o M admitted on 07/07/22. Pt's wife reports pt had 2 staring episodes following d/c home from [place] where pt was unresponsive to his name during the episode & wife was concerned about a seizure. Pt is being treated for LLL PNA & severe sepsis. PMH: HTN, lewy body dementia, CKD3A, 1 month admission at the [place] with d/c on 07/06/22 following tx for appendicitis & intraabdominal abscesses where he underwent laparotomy with colectomy & ileostomy placement, anxiety    PT Comments    Pt seen for PT tx with pt received asleep in bed but easily awakened & agreeable to tx. Pt received on room air with nasal cannula laying to the side in the bed, SpO2 >90% on room air so pt left on room air. Pt with poor ability to follow simple commands & requires max assist for supine>sit with HOB elevated. Pt demonstrates posterior lean with sitting EOB. Pt initiates clearing buttocks from EOB/seat so was able to assist with bed>drop arm recliner with max assist. PT provides manual facilitation for anterior weight shift & pivoting. During transfer, JP drain become disconnected & nurse called to room & addressed it. Pt left in recliner set up with meal tray. Continue to recommend STR upon d/c to maximize independence with functional mobility, decrease caregiver burden, & reduce fall risk prior to return home.     Recommendations for follow up therapy are one component of a multi-disciplinary discharge planning process, led by the attending physician.  Recommendations may be updated based on patient status, additional functional criteria and insurance authorization.  Follow Up Recommendations  Skilled nursing-short term rehab (<3 hours/day) Can patient physically be transported by private vehicle: No   Assistance Recommended at Discharge Frequent or constant  Supervision/Assistance  Patient can return home with the following Two people to help with walking and/or transfers;Two people to help with bathing/dressing/bathroom;Help with stairs or ramp for entrance;Direct supervision/assist for medications management;Assistance with feeding;Assist for transportation;Assistance with cooking/housework;Direct supervision/assist for financial management   Equipment Recommendations  None recommended by PT    Recommendations for Other Services       Precautions / Restrictions Precautions Precautions: Fall Precaution Comments: ostomy bag, JP drain, foley Restrictions Weight Bearing Restrictions: No     Mobility  Bed Mobility Overal bed mobility: Needs Assistance Bed Mobility: Supine to Sit     Supine to sit: Max assist, HOB elevated          Transfers Overall transfer level: Needs assistance   Transfers: Bed to chair/wheelchair/BSC             General transfer comment: Pt completes stand/squat pivot bed>recliner on R with drop arm with max assist & multiple pivots. Pt somewhat assists in raising buttocks off seat but requires heavy assist to pivot.    Ambulation/Gait                   Stairs             Wheelchair Mobility    Modified Rankin (Stroke Patients Only)       Balance Overall balance assessment: Needs assistance Sitting-balance support: Feet supported, Bilateral upper extremity supported Sitting balance-Leahy Scale: Poor  Cognition Arousal/Alertness: Awake/alert Behavior During Therapy: Flat affect Overall Cognitive Status: Impaired/Different from baseline Area of Impairment: Orientation, Attention, Memory, Following commands, Safety/judgement, Awareness, Problem solving                 Orientation Level: Disoriented to, Place, Time, Situation   Memory: Decreased short-term memory, Decreased recall of precautions Following Commands:  Follows one step commands inconsistently, Follows one step commands with increased time Safety/Judgement: Decreased awareness of safety, Decreased awareness of deficits Awareness: Intellectual Problem Solving: Slow processing, Decreased initiation, Difficulty sequencing, Requires verbal cues, Requires tactile cues General Comments: Pt responds to name, verbally communicates with PT at times but too soft spoken to understand.        Exercises      General Comments        Pertinent Vitals/Pain Pain Assessment Pain Assessment: Faces Faces Pain Scale: No hurt    Home Living                          Prior Function            PT Goals (current goals can now be found in the care plan section) Acute Rehab PT Goals Patient Stated Goal: to get better PT Goal Formulation: With family Time For Goal Achievement: 07/22/22 Potential to Achieve Goals: Fair Progress towards PT goals: Progressing toward goals    Frequency    Min 2X/week      PT Plan Current plan remains appropriate    Co-evaluation              AM-PAC PT "6 Clicks" Mobility   Outcome Measure  Help needed turning from your back to your side while in a flat bed without using bedrails?: A Lot Help needed moving from lying on your back to sitting on the side of a flat bed without using bedrails?: Total Help needed moving to and from a bed to a chair (including a wheelchair)?: Total Help needed standing up from a chair using your arms (e.g., wheelchair or bedside chair)?: Total Help needed to walk in hospital room?: Total Help needed climbing 3-5 steps with a railing? : Total 6 Click Score: 7    End of Session   Activity Tolerance: Patient tolerated treatment well Patient left: in chair;with chair alarm set;with call bell/phone within reach Nurse Communication: Mobility status PT Visit Diagnosis: Muscle weakness (generalized) (M62.81);Difficulty in walking, not elsewhere classified  (R26.2);Other abnormalities of gait and mobility (R26.89)     Time: 8921-1941 PT Time Calculation (min) (ACUTE ONLY): 14 min  Charges:  $Therapeutic Activity: 8-22 mins                     Aleda Grana, PT, DPT 07/13/22, 10:03 AM  Sandi Mariscal 07/13/2022, 10:01 AM

## 2022-07-13 NOTE — Progress Notes (Signed)
Pt. strongly refused drinking the contrast after a couple of sips. Attending MD notified.

## 2022-07-13 NOTE — Care Management Important Message (Signed)
Important Message  Patient Details  Name: Delos Klich MRN: [id number] Date of Birth: 12/01/1945   Medicare Important Message Given:  Yes     Johnell Comings 07/13/2022, 11:13 AM

## 2022-07-13 NOTE — TOC Progression Note (Signed)
Transition of Care [facility]) - Progression Note    Patient Details  Name: Carl Figueroa MRN: [id number] Date of Birth: 10-31-1945  Transition of Care [facility]) CM/SW Contact  Colin Broach, LCSW Phone Number: 07/13/2022, 3:15 PM  Clinical Narrative:    CSW spoke with Marcelino Duster @  [facility] to discuss bed for discharge for today.  She asked if pt drain still out (she saw that it came out earlier in the day).  She states that, he can still come to the facility if he has the drain, she just needs to know so that she can know where to place pt.  Patient can go to facility on Monday 11/27 per [facility].  Information relayed to MD.   Expected Discharge Plan: Skilled Nursing Facility    Expected Discharge Plan and Services Expected Discharge Plan: Skilled Nursing Facility In-house Referral: Clinical Social Work   Post Acute Care Choice: Skilled Nursing Facility Living arrangements for the past 2 months: Single Family Home                                       Social Determinants of Health (SDOH) Interventions    Readmission Risk Interventions     No data to display

## 2022-07-13 NOTE — Plan of Care (Signed)
  Problem: Fluid Volume: Goal: Hemodynamic stability will improve Outcome: Progressing   Problem: Clinical Measurements: Goal: Signs and symptoms of infection will decrease Outcome: Progressing   Problem: Respiratory: Goal: Ability to maintain adequate ventilation will improve Outcome: Progressing   Problem: Education: Goal: Knowledge of disease or condition will improve Outcome: Progressing   Problem: Ischemic Stroke/TIA Tissue Perfusion: Goal: Complications of ischemic stroke/TIA will be minimized Outcome: Progressing   Problem: Coping: Goal: Will verbalize positive feelings about self Outcome: Progressing   Problem: Self-Care: Goal: Ability to participate in self-care as condition permits will improve Outcome: Progressing   Problem: Nutrition: Goal: Risk of aspiration will decrease Outcome: Progressing   Problem: Nutrition: Goal: Adequate nutrition will be maintained Outcome: Progressing   Problem: Coping: Goal: Level of anxiety will decrease Outcome: Progressing   Problem: Safety: Goal: Ability to remain free from injury will improve Outcome: Progressing   Problem: Skin Integrity: Goal: Risk for impaired skin integrity will decrease Outcome: Progressing

## 2022-07-13 NOTE — Progress Notes (Signed)
Right upper quadrant abdominal drain present, which is not draining much more than 20 mL of dark serosanguinous fluid since his admission.  Apparently it was flushed in the ED, his wife at bedside reports that she was supposed to flush it twice daily with what sounds like to an mL syringe of normal saline solution.  To my understanding is has not been done since his admission.  On CT scan I do not appreciate a significant fluid collection around the pigtail, and it may have served its purpose.  Due to the patient's current mental status, it would be wise to remove his many drains/catheters as possible.  His wife is also expressed the possibility that since he has been on medications for his urinary retention, that perhaps his Foley could come out as well.  His ileostomy appears to be functioning well.  I have tested the drain and it appears that the bulb is functioning well, however I am not certain as to how well it is actually drawing from its internal site.  It would likely be prudent and it is the patient's wife's request that we reimage his abdomen, prior to this right upper quadrant drains removal.   If it appears that there is still more to drain in the region of the pigtail then I would expect resumption of irrigation to be warranted.  Let me know if there is any other way I can help.   Campbell Lerner, M.D., [facility]  07/13/2022 ; 4:23 PM

## 2022-07-13 NOTE — Progress Notes (Signed)
PROGRESS NOTE Carl Figueroa  [id number] DOB: 04-Dec-1945 DOA: 07/07/2022 PCP: Leonel Ramsay, MD   Brief Narrative/Hospital Course: 76 year old with history of hypertension, dementia  CKD 3a, who lives in a nursing facility , recently discharged on 11/17 from a 1 month admission at the [place] for appendicitis and intra-abdominal abscesses where he underwent laparotomy with  total colectomy and ileostomy placement and discharged with Foley cathete, ileostomy  and JP drain brought to the ED for staring episode x2 since arrival home, once on the day of discharge on 11/17 and another on 11/18.  He was unresponsive to his name during the episode.  Wife was concerned about a seizure and called EMS .  On presentation he was hypotensive but responded to IV fluids.  Lab work showed leukocytosis, lactic acidosis, AKI.  CT head did not show any acute findings.  CT chest/abdomen/pelvis showed left lower lobe pneumonia, possible aspiration, diffuse mesenteric haziness.  Patient was started on broad-spectrum antibiotics for the suspicion of severe sepsis secondary to left lower lobe pneumonia.  Hospital course also remarkable for confusion, agitation but now has improved.  PT/OT recommending SNF.  Antibiotics has been changed to oral.  He is medically stable to return to skilled nursing facility whenever possible. Discharge delayed due to Thanking Giving holidays.TOC closely following  Per [facility]: He " patient was discharged with Foley catheter, right lower quadrant percutaneous drain, ileostomy, he had CT abdomen pelvis done November/15/23 at [facility].   Subjective: Seen and examined this morning Overnight afebrile, saturating 99-100% RA, SBP 120s-150s.  Labs reviewed 11/23 stable Alert awake. Pleasant. Working w/ PT- transferred to bedside chair and about to have his meal. Colostomy full with stool Jp drain + output 10 cc  Assessment and Plan: Principal Problem:   Severe sepsis ([place]) Active Problems:    Left lower lobe pneumonia   Foley catheter in place on admission   Acute metabolic encephalopathy   Staring episodes   Acute renal failure superimposed on stage 3a chronic kidney disease ([place])   S/P laparotomy   Long term (current) use of antibiotics   Hypotension   Colostomy present (HCC)   Hyperkalemia   Essential hypertension   Dementia without behavioral disturbance (HCC)   Severe sepsis POA 2/2 pna Lll Pneumonia: Sepsis physiology resolved.  Afebrile Dowless count normal, culture data unremarkable treated for pneumonia and sepsis at this time on oral antibiotics augmentin x 7 days    Encephalopathy/staring episodes/history of dementia: Underwent extensive work-up including CT head negative/ EEG showed moderate to severe diffuse slowing indicative of global cerebral dysfunction, no epileptiform abnormality seen.  Likely from sepsis, mentation improved and now close to baseline with history of dementia short-term memory problem continue delirium precaution fall precaution frequent reorientation minimize benzodiazepine opiates continue home Seroquel and trazodone.Lives in nursing facility  Status post ileostomy- He had laparotomy with near-total colectomy on 05/30/2022 for appendicitis with intra-abdominal abscess, bowel perforation.  Her right lower quadrant pain, [facility] by [facility], seen by general surgery for concern of infection from intra-abdominal source which has been ruled out.He is making solid stool in the colostomy bag.Needs to follow up with [facility] surgery for drain removal.   ID pharmacist reviewed  he was supposed to be on linezolid/augmentin x 5 days from d/c date of 11/17-Wife confirmed.Did vanco until 11/21 to cover thru 11/22.    Chronic urinary retention: Came in with Foley catheter from [facility] we will continue the same follow-up with urology outpatient at [facility].  Continue On  Proscar, Flomax.He has an appointment with [facility] in [place] next week as per wife.     Hypertension: Blood pressure is controlled PTA on lisinopril held due to AKI  AKI: Resolved Normocytic anemia: Likely chronic, hemoglobin stable in the range of 8 Hyperkalemia/hypernatremia: Resolved Pressure injury coccyx stage II POA see below Pressure Injury 07/08/22 Coccyx Bilateral Stage 2 -  Partial thickness loss of dermis presenting as a shallow open injury with a red, pink wound bed without slough. (Active)  07/08/22 1534  Location: Coccyx  Location Orientation: Bilateral  Staging: Stage 2 -  Partial thickness loss of dermis presenting as a shallow open injury with a red, pink wound bed without slough.  Wound Description (Comments):   Present on Admission: Yes  Dressing Type Foam - Lift dressing to assess site every shift 07/12/22 1930    DVT prophylaxis: enoxaparin (LOVENOX) injection 40 mg Start: 07/07/22 2230 Code Status:   Code Status: Full Code Family Communication: plan of care discussed with patient/no family at bedside.  Patient status is: Inpatient because of pending SNF Level of care: Med-Surg  Dispo:The patient is from: Home            Anticipated disposition: SNF once bed available- messaged TOC.  Objective: Vitals last 24 hrs: Vitals:   07/12/22 0739 07/12/22 1518 07/12/22 2314 07/13/22 0758  BP: 134/84 (!) 150/82 127/61 126/65  Pulse: 79 78 81 88  Resp: 18 16 18 16   Temp: (!) 97.4 F (36.3 C) 98.9 F (37.2 C)  98.1 F (36.7 C)  TempSrc:      SpO2: 100% 100% 100% 99%  Weight:      Height:       Weight change:   Physical Examination: General exam:Alert awake, older than stated age HEENT:Oral mucosa moist, Ear/Nose WNL grossly Respiratory system:Bilaterally clear BS,no use of accessory muscle Cardiovascular system: S1 & S2 +,No JVD. Gastrointestinal system: Abdomen soft, colostomy+ , JP drain+, BS+ Nervous System:Alert, awake, moving extremities. Extremities: LE edema neg, distal peripheral pulses palpable.  Skin: No rashes,no icterus. MSK:  Normal muscle bulk,tone, power  Medications reviewed:  Scheduled Meds:   stroke: early stages of recovery book   Does not apply Once   amoxicillin-clavulanate  1 tablet Oral Q12H   Chlorhexidine Gluconate Cloth  6 each Topical Daily   enoxaparin (LOVENOX) injection  40 mg Subcutaneous Q24H   finasteride  5 mg Oral Daily   mouth rinse  15 mL Mouth Rinse Q2H   sertraline  25 mg Oral Daily   tamsulosin  0.4 mg Oral Daily   traZODone  50 mg Oral BID  Continuous Infusions:  sodium chloride Stopped (07/08/22 2225)   Intake/Output Summary (Last 24 hours) at 07/13/2022 1420 Last data filed at 07/13/2022 0518 Gross per 24 hour  Intake 0 ml  Output 1125 ml  Net -1125 ml   Net IO Since Admission: -3,109.16 mL [07/13/22 1420]  Wt Readings from Last 3 Encounters:  07/09/22 55.7 kg  05/09/22 76.3 kg  01/11/22 79.4 kg     Unresulted Labs (From admission, onward)     Start     Ordered   07/14/22 0500  Creatinine, serum  (enoxaparin (LOVENOX)    CrCl >/= 30 ml/min)  Weekly,   R     Comments: while on enoxaparin therapy    07/07/22 2224          Data Reviewed: I have personally reviewed following labs and imaging studies CBC: Recent Labs  Lab 07/07/22 1633  07/08/22 0319 07/09/22 0231 07/10/22 0556  WBC 15.4* 12.4* 11.5* 9.0  NEUTROABS 13.4*  --   --   --   HGB 9.4* 7.5* 8.3* 8.1*  HCT 29.3* 23.3* 25.4* 24.7*  MCV 86.7 86.0 86.1 86.1  PLT 403* 302 261 [id number]   Basic Metabolic Panel: Recent Labs  Lab 07/07/22 1633 07/08/22 0319 07/08/22 0840 07/08/22 2257 07/09/22 0231 07/09/22 0630 07/10/22 0556 07/12/22 0532  NA 143 144  --   --  145  --  147* 141  K 5.8* 5.5*   < > 4.7 4.7 4.5 4.0 3.9  CL 111 115*  --   --  114*  --  115* 113*  CO2 22 25  --   --  26  --  27 24  GLUCOSE 143* 104*  --   --  79  --  98 87  BUN 24* 23  --   --  20  --  16 12  CREATININE 1.79* 1.52*  --   --  1.40*  --  1.14 1.04  CALCIUM 10.7* 10.1  --   --  10.1  --  10.0 9.8   < > = values in  this interval not displayed.   GFR: Estimated Creatinine Clearance: 47.6 mL/min (by C-G formula based on SCr of 1.04 mg/dL). Liver Function Tests: Recent Labs  Lab 07/07/22 1633 07/07/22 1845 07/09/22 0840  LATICACIDVEN 4.3* 3.5* 1.0   Recent Results (from the past 240 hour(s))  Gastrointestinal Panel by PCR , Stool     Status: None   Collection Time: 07/07/22  4:33 PM   Specimen: Stool  Result Value Ref Range Status   Campylobacter species NOT DETECTED NOT DETECTED Final   Plesimonas shigelloides NOT DETECTED NOT DETECTED Final   Salmonella species NOT DETECTED NOT DETECTED Final   Yersinia enterocolitica NOT DETECTED NOT DETECTED Final   Vibrio species NOT DETECTED NOT DETECTED Final   Vibrio cholerae NOT DETECTED NOT DETECTED Final   Enteroaggregative E coli (EAEC) NOT DETECTED NOT DETECTED Final   Enteropathogenic E coli (EPEC) NOT DETECTED NOT DETECTED Final   Enterotoxigenic E coli (ETEC) NOT DETECTED NOT DETECTED Final   Shiga like toxin producing E coli (STEC) NOT DETECTED NOT DETECTED Final   Shigella/Enteroinvasive E coli (EIEC) NOT DETECTED NOT DETECTED Final   Cryptosporidium NOT DETECTED NOT DETECTED Final   Cyclospora cayetanensis NOT DETECTED NOT DETECTED Final   Entamoeba histolytica NOT DETECTED NOT DETECTED Final   Giardia lamblia NOT DETECTED NOT DETECTED Final   Adenovirus F40/41 NOT DETECTED NOT DETECTED Final   Astrovirus NOT DETECTED NOT DETECTED Final   Norovirus GI/GII NOT DETECTED NOT DETECTED Final   Rotavirus A NOT DETECTED NOT DETECTED Final   Sapovirus (I, II, IV, and V) NOT DETECTED NOT DETECTED Final    Comment: Performed at [facility], [address] [place], [place] [address]  C Difficile Quick Screen w PCR reflex     Status: None   Collection Time: 07/07/22  4:33 PM   Specimen: Stool  Result Value Ref Range Status   C Diff antigen NEGATIVE NEGATIVE Final   C Diff toxin NEGATIVE NEGATIVE Final   C Diff interpretation No C.  difficile detected.  Final    Comment: Performed at [facility], [address] [place], [place] [address]  Culture, blood (single)     Status: None   Collection Time: 07/07/22  6:34 PM   Specimen: BLOOD RIGHT ARM  Result Value Ref  Range Status   Specimen Description BLOOD RIGHT ARM  Final   Special Requests   Final    BOTTLES DRAWN AEROBIC AND ANAEROBIC Blood Culture results may not be optimal due to an inadequate volume of blood received in culture bottles   Culture   Final    NO GROWTH 5 DAYS Performed at [facility], [address] [place], [place] [address]    Report Status 07/12/2022 FINAL  Final  MRSA Next Gen by PCR, Nasal     Status: None   Collection Time: 07/09/22  3:49 PM   Specimen: Nasal Mucosa; Nasal Swab  Result Value Ref Range Status   MRSA by PCR Next Gen NOT DETECTED NOT DETECTED Final    Comment: (NOTE) The GeneXpert MRSA Assay (FDA approved for NASAL specimens only), is one component of a comprehensive MRSA colonization surveillance program. It is not intended to diagnose MRSA infection nor to guide or monitor treatment for MRSA infections. Test performance is not FDA approved in patients less than 68 years old. Performed at [facility], [address] [place], [place] [address]     Antimicrobials: Anti-infectives (From admission, onward)    Start     Dose/Rate Route Frequency Ordered Stop   07/10/22 2200  amoxicillin-clavulanate (AUGMENTIN) 875-125 MG per tablet 1 tablet        1 tablet Oral Every 12 hours 07/10/22 1610 07/13/22 2359   07/09/22 2100  vancomycin (VANCOCIN) IVPB 1000 mg/200 mL premix        1,000 mg 200 mL/hr over 60 Minutes Intravenous Every 24 hours 07/09/22 0727 07/10/22 2200   07/08/22 2100  vancomycin (VANCOREADY) IVPB 750 mg/150 mL  Status:  Discontinued        750 mg 150 mL/hr over 60 Minutes Intravenous Every 24 hours 07/07/22 2248 07/09/22 0727   07/08/22 2000  Ampicillin-Sulbactam (UNASYN) 3 g  in sodium chloride 0.9 % 100 mL IVPB  Status:  Discontinued        3 g 200 mL/hr over 30 Minutes Intravenous Every 6 hours 07/08/22 1422 07/10/22 1610   07/08/22 0600  Ampicillin-Sulbactam (UNASYN) 3 g in sodium chloride 0.9 % 100 mL IVPB  Status:  Discontinued        3 g 200 mL/hr over 30 Minutes Intravenous Every 8 hours 07/07/22 2240 07/08/22 1422   07/07/22 1845  vancomycin (VANCOREADY) IVPB 1250 mg/250 mL        1,250 mg 166.7 mL/hr over 90 Minutes Intravenous  Once 07/07/22 1833 07/07/22 2237   07/07/22 1830  ceFEPIme (MAXIPIME) 2 g in sodium chloride 0.9 % 100 mL IVPB        2 g 200 mL/hr over 30 Minutes Intravenous  Once 07/07/22 1827 07/07/22 2033   07/07/22 1830  metroNIDAZOLE (FLAGYL) IVPB 500 mg        500 mg 100 mL/hr over 60 Minutes Intravenous  Once 07/07/22 1827 07/07/22 2057   07/07/22 1830  vancomycin (VANCOCIN) IVPB 1000 mg/200 mL premix  Status:  Discontinued        1,000 mg 200 mL/hr over 60 Minutes Intravenous  Once 07/07/22 1827 07/07/22 1833      Culture/Microbiology    Component Value Date/Time   SDES BLOOD RIGHT ARM 07/07/2022 1834   SPECREQUEST  07/07/2022 1834    BOTTLES DRAWN AEROBIC AND ANAEROBIC Blood Culture results may not be optimal due to an inadequate volume of blood received in culture bottles   CULT  07/07/2022 1834  NO GROWTH 5 DAYS Performed at [facility], [address] [place], [place] [address]    REPTSTATUS 07/12/2022 FINAL 07/07/2022 1834  Radiology Studies: No results found.   LOS: 6 days   Lanae Boast, MD [facility]  07/13/2022, 2:20 PM

## 2022-07-13 NOTE — Plan of Care (Incomplete)
  Problem: Fluid Volume: Goal: Hemodynamic stability will improve Outcome: Progressing   Problem: Clinical Measurements: Goal: Diagnostic test results will improve Outcome: Progressing Goal: Signs and symptoms of infection will decrease Outcome: Progressing   Problem: Respiratory: Goal: Ability to maintain adequate ventilation will improve Outcome: Progressing   Problem: Education: Goal: Knowledge of disease or condition will improve Outcome: Progressing Goal: Knowledge of secondary prevention will improve (MUST DOCUMENT ALL) Outcome: Progressing Goal: Knowledge of patient specific risk factors will improve (Mark N/A or DELETE if not current risk factor) Outcome: Progressing   Problem: Ischemic Stroke/TIA Tissue Perfusion: Goal: Complications of ischemic stroke/TIA will be minimized Outcome: Progressing   Problem: Coping: Goal: Will verbalize positive feelings about self Outcome: Progressing Goal: Will identify appropriate support needs Outcome: Progressing   Problem: Health Behavior/Discharge Planning: Goal: Ability to manage health-related needs will improve Outcome: Progressing Goal: Goals will be collaboratively established with patient/family Outcome: Progressing   Problem: Self-Care: Goal: Ability to participate in self-care as condition permits will improve Outcome: Progressing Goal: Verbalization of feelings and concerns over difficulty with self-care will improve Outcome: Progressing Goal: Ability to communicate needs accurately will improve Outcome: Progressing   Problem: Nutrition: Goal: Risk of aspiration will decrease Outcome: Progressing Goal: Dietary intake will improve Outcome: Progressing   Problem: Education: Goal: Knowledge of General Education information will improve Description: Including pain rating scale, medication(s)/side effects and non-pharmacologic comfort measures Outcome: Progressing   Problem: Health Behavior/Discharge  Planning: Goal: Ability to manage health-related needs will improve Outcome: Progressing   Problem: Clinical Measurements: Goal: Ability to maintain clinical measurements within normal limits will improve Outcome: Progressing Goal: Will remain free from infection Outcome: Progressing Goal: Diagnostic test results will improve Outcome: Progressing Goal: Respiratory complications will improve Outcome: Progressing Goal: Cardiovascular complication will be avoided Outcome: Progressing   Problem: Activity: Goal: Risk for activity intolerance will decrease Outcome: Progressing   Problem: Nutrition: Goal: Adequate nutrition will be maintained Outcome: Progressing   Problem: Coping: Goal: Level of anxiety will decrease Outcome: Progressing   Problem: Elimination: Goal: Will not experience complications related to bowel motility Outcome: Progressing Goal: Will not experience complications related to urinary retention Outcome: Progressing   Problem: Pain Managment: Goal: General experience of comfort will improve Outcome: Progressing   Problem: Safety: Goal: Ability to remain free from injury will improve Outcome: Progressing   Problem: Skin Integrity: Goal: Risk for impaired skin integrity will decrease Outcome: Progressing   

## 2022-07-14 DIAGNOSIS — A419 Sepsis, unspecified organism: Secondary | ICD-10-CM | POA: Diagnosis not present

## 2022-07-14 DIAGNOSIS — R6520 Severe sepsis without septic shock: Secondary | ICD-10-CM | POA: Diagnosis not present

## 2022-07-14 LAB — CREATININE, SERUM
Creatinine, Ser: 0.97 mg/dL (ref 0.61–1.24)
GFR, Estimated: >60 mL/min (ref >60)

## 2022-07-14 MED ORDER — SODIUM CHLORIDE 0.9 % IV SOLN: INTRAVENOUS | Status: DC

## 2022-07-14 NOTE — Progress Notes (Signed)
PROGRESS NOTE    Carl Figueroa  WUJ:[id number] DOB: 1945/11/25 DOA: 07/07/2022 PCP: Mick Sell, MD   Brief Narrative:  76 year old with history of hypertension, dementia  CKD 3a, who lives in a nursing facility , recently discharged on 11/17 from a 1 month admission at the [place] for appendicitis and intra-abdominal abscesses where he underwent laparotomy with  total colectomy and ileostomy placement and discharged with Foley cathete, ileostomy  and JP drain brought to the ED for staring episode x2 since arrival home, once on the day of discharge on 11/17 and another on 11/18.  He was unresponsive to his name during the episode.  Wife was concerned about a seizure and called EMS .  On presentation he was hypotensive but responded to IV fluids.  Lab work showed leukocytosis, lactic acidosis, AKI.  CT head did not show any acute findings.  CT chest/abdomen/pelvis showed left lower lobe pneumonia, possible aspiration, diffuse mesenteric haziness.  Patient was started on broad-spectrum antibiotics for the suspicion of severe sepsis secondary to left lower lobe pneumonia.  Hospital course also remarkable for confusion, agitation but now has improved.  PT/OT recommending SNF.  Antibiotics has been changed to oral.  He is medically stable to return to skilled nursing facility whenever possible. Discharge delayed due to ThanksGiving holidays.TOC closely following  [facility]: He " patient was discharged with Foley catheter, right lower quadrant percutaneous drain, ileostomy, he had CT abdomen pelvis done November/15/23 at [facility].    Assessment & Plan:  Severe sepsis secondary to left lower lobe pneumonia: Sepsis physiology resolved.  Afebrile, leukocytosis resolved, culture data unremarkable treated for pneumonia and sepsis.  Finished Augmentin course for x 7 days    Encephalopathy/staring episodes/history of dementia: -Underwent extensive work-up including CT head negative/ EEG showed moderate  to severe diffuse slowing indicative of global cerebral dysfunction, no epileptiform abnormality seen.   -Likely from sepsis, mentation improved and now close to baseline with history of dementia short-term memory problem  -continue delirium precaution, fall precaution, frequent reorientation  -minimize benzodiazepine opiates  -continue home zoloft  and trazodone-Lives in nursing facility   Status post ileostomy-  -He had laparotomy with near-total colectomy on 05/30/2022 for appendicitis with intra-abdominal abscess, bowel perforation.  [facility], seen by general surgery for concern of infection from intra-abdominal source which has been ruled out.He is making solid stool in the colostomy bag.Needs to [facility] up with [facility] for [facility].   -Finished antibiotics course with vanco and Augmentin -Repeat CT scan of abdomen/pelvis on 07/09/2022 shows decrease in size of fluid collection-await further recommendations from general surgery regarding drain removal.   Chronic urinary retention: Came in with Foley catheter from [facility] we will continue the same. follow-up with urology outpatient at [facility].  Continue On Proscar, Flomax.[facility] [place] next [facility] as per wife.  -Wife requested trial of Foley removal   Hypertension: Blood pressure is controlled PTA on lisinopril held due to AKI   AKI: Resolved  Normocytic anemia: Likely chronic, hemoglobin stable in the range of 8  Hyperkalemia/hypernatremia: Resolved  Pressure injury coccyx stage II POA see below     Pressure Injury 07/08/22 Coccyx Bilateral Stage 2 -  Partial thickness loss of dermis presenting as a shallow open injury with a red, pink wound bed without slough. (Active)  07/08/22 1534  Location: Coccyx  Location Orientation: Bilateral  Staging: Stage 2 -  Partial thickness loss of dermis presenting as a  shallow open injury with a red, pink wound bed without slough.  Wound Description  (Comments):   Present on Admission: Yes  Dressing Type Foam - Lift dressing to assess site every shift     DVT prophylaxis: Lovenox Code Status: Full code Family Communication:  None present at bedside.  Plan of care discussed with patient in length and he verbalized understanding and agreed with it.  I called patient's wife and discussed plan of care and she verbalized understanding.  Disposition Plan: SNF on Monday  Consultants:  General surgery  Procedures:  None  Antimicrobials:  See above  Status is: Inpatient    Subjective: Patient seen and examined.  No family at the bedside.  Patient alert and awake however not oriented to time place or person.  No acute events overnight.  Remained afebrile.  Objective: Vitals:   07/13/22 0758 07/13/22 2025 07/14/22 0445 07/14/22 0757  BP: 126/65 116/66 125/71 117/64  Pulse: 88 92 77 81  Resp: 16 18 16    Temp: 98.1 F (36.7 C)  98 F (36.7 C) (!) 97.5 F (36.4 C)  TempSrc:      SpO2: 99%  100% 100%  Weight:      Height:        Intake/Output Summary (Last 24 hours) at 07/14/2022 1259 Last data filed at 07/14/2022 1117 Gross per 24 hour  Intake 932.14 ml  Output 150 ml  Net 782.14 ml   Filed Weights   07/07/22 1626 07/09/22 2209  Weight: 68 kg 55.7 kg    Examination:  General exam: Appears calm and comfortable, on room air, appears weak Respiratory system: Clear to auscultation. Respiratory effort normal. Cardiovascular system: S1 & S2 heard, RRR. No JVD, murmurs, rubs, gallops or clicks. No pedal edema. Gastrointestinal system: Abdomen soft, colostomy bag intact.  Brown stool noted in the bag.  Drain intact.  Small amount of bloody drainage noted.   Central nervous system: Alert but not oriented.  Following commands.   Skin: No rashes, lesions or ulcers Psychiatry: Judgement and insight appear normal. Mood & affect appropriate.    Data Reviewed: I have personally reviewed following labs and imaging  studies  CBC: Recent Labs  Lab 07/07/22 1633 07/08/22 0319 07/09/22 0231 07/10/22 0556  WBC 15.4* 12.4* 11.5* 9.0  NEUTROABS 13.4*  --   --   --   HGB 9.4* 7.5* 8.3* 8.1*  HCT 29.3* 23.3* 25.4* 24.7*  MCV 86.7 86.0 86.1 86.1  PLT 403* 302 261 242   Basic Metabolic Panel: Recent Labs  Lab 07/07/22 1633 07/08/22 0319 07/08/22 0840 07/08/22 2257 07/09/22 0231 07/09/22 0630 07/10/22 0556 07/12/22 0532 07/14/22 0456  NA 143 144  --   --  145  --  147* 141  --   K 5.8* 5.5*   < > 4.7 4.7 4.5 4.0 3.9  --   CL 111 115*  --   --  114*  --  115* 113*  --   CO2 22 25  --   --  26  --  27 24  --   GLUCOSE 143* 104*  --   --  79  --  98 87  --   BUN 24* 23  --   --  20  --  16 12  --   CREATININE 1.79* 1.52*  --   --  1.40*  --  1.14 1.04 0.97  CALCIUM 10.7* 10.1  --   --  10.1  --  10.0 9.8  --    < > =  values in this interval not displayed.   GFR: Estimated Creatinine Clearance: 51 mL/min (by C-G formula based on SCr of 0.97 mg/dL). Liver Function Tests: Recent Labs  Lab 07/07/22 1633  AST 35  ALT 20  ALKPHOS 127*  BILITOT 0.8  PROT 8.7*  ALBUMIN 2.8*   No results for input(s): "LIPASE", "AMYLASE" in the last 168 hours. No results for input(s): "AMMONIA" in the last 168 hours. Coagulation Profile: No results for input(s): "INR", "PROTIME" in the last 168 hours. Cardiac Enzymes: No results for input(s): "CKTOTAL", "CKMB", "CKMBINDEX", "TROPONINI" in the last 168 hours. BNP (last 3 results) No results for input(s): "PROBNP" in the last 8760 hours. HbA1C: No results for input(s): "HGBA1C" in the last 72 hours. CBG: No results for input(s): "GLUCAP" in the last 168 hours. Lipid Profile: No results for input(s): "CHOL", "HDL", "LDLCALC", "TRIG", "CHOLHDL", "LDLDIRECT" in the last 72 hours. Thyroid Function Tests: No results for input(s): "TSH", "T4TOTAL", "FREET4", "T3FREE", "THYROIDAB" in the last 72 hours. Anemia Panel: No results for input(s): "VITAMINB12",  "FOLATE", "FERRITIN", "TIBC", "IRON", "RETICCTPCT" in the last 72 hours. Sepsis Labs: Recent Labs  Lab 07/07/22 1633 07/07/22 1845 07/09/22 0840  LATICACIDVEN 4.3* 3.5* 1.0    Recent Results (from the past 240 hour(s))  Gastrointestinal Panel by PCR , Stool     Status: None   Collection Time: 07/07/22  4:33 PM   Specimen: Stool  Result Value Ref Range Status   Campylobacter species NOT DETECTED NOT DETECTED Final   Plesimonas shigelloides NOT DETECTED NOT DETECTED Final   Salmonella species NOT DETECTED NOT DETECTED Final   Yersinia enterocolitica NOT DETECTED NOT DETECTED Final   Vibrio species NOT DETECTED NOT DETECTED Final   Vibrio cholerae NOT DETECTED NOT DETECTED Final   Enteroaggregative E coli (EAEC) NOT DETECTED NOT DETECTED Final   Enteropathogenic E coli (EPEC) NOT DETECTED NOT DETECTED Final   Enterotoxigenic E coli (ETEC) NOT DETECTED NOT DETECTED Final   Shiga like toxin producing E coli (STEC) NOT DETECTED NOT DETECTED Final   Shigella/Enteroinvasive E coli (EIEC) NOT DETECTED NOT DETECTED Final   Cryptosporidium NOT DETECTED NOT DETECTED Final   Cyclospora cayetanensis NOT DETECTED NOT DETECTED Final   Entamoeba histolytica NOT DETECTED NOT DETECTED Final   Giardia lamblia NOT DETECTED NOT DETECTED Final   Adenovirus F40/41 NOT DETECTED NOT DETECTED Final   Astrovirus NOT DETECTED NOT DETECTED Final   Norovirus GI/GII NOT DETECTED NOT DETECTED Final   Rotavirus A NOT DETECTED NOT DETECTED Final   Sapovirus (I, II, IV, and V) NOT DETECTED NOT DETECTED Final    Comment: Performed at [facility], [address] [place], [place] [address]  C Difficile Quick Screen w PCR reflex     Status: None   Collection Time: 07/07/22  4:33 PM   Specimen: Stool  Result Value Ref Range Status   C Diff antigen NEGATIVE NEGATIVE Final   C Diff toxin NEGATIVE NEGATIVE Final   C Diff interpretation No C. difficile detected.  Final    Comment: Performed at [facility], [address] [place], [place] [address]  Culture, blood (single)     Status: None   Collection Time: 07/07/22  6:34 PM   Specimen: BLOOD RIGHT ARM  Result Value Ref Range Status   Specimen Description BLOOD RIGHT ARM  Final   Special Requests   Final    BOTTLES DRAWN AEROBIC AND ANAEROBIC Blood Culture results may not be optimal due to an inadequate volume  of blood received in culture bottles   Culture   Final    NO GROWTH 5 DAYS Performed at [facility], [address] [place], [place] [address]    Report Status 07/12/2022 FINAL  Final  MRSA Next Gen by PCR, Nasal     Status: None   Collection Time: 07/09/22  3:49 PM   Specimen: Nasal Mucosa; Nasal Swab  Result Value Ref Range Status   MRSA by PCR Next Gen NOT DETECTED NOT DETECTED Final    Comment: (NOTE) The GeneXpert MRSA Assay (FDA approved for NASAL specimens only), is one component of a comprehensive MRSA colonization surveillance program. It is not intended to diagnose MRSA infection nor to guide or monitor treatment for MRSA infections. Test performance is not FDA approved in patients less than 39 years old. Performed at [facility], [address] [place], [place] [address]       Radiology Studies: CT ABDOMEN PELVIS W CONTRAST  Result Date: 07/13/2022 CLINICAL DATA:  Acute nonlocalized abdominal pain; drain in place; follow-up imaging for drain management EXAM: CT ABDOMEN AND PELVIS WITH CONTRAST TECHNIQUE: Multidetector CT imaging of the abdomen and pelvis was performed using the standard protocol following bolus administration of intravenous contrast. RADIATION DOSE REDUCTION: This exam was performed according to the departmental dose-optimization program which includes automated exposure control, adjustment of the mA and/or kV according to patient size and/or use of iterative reconstruction technique. CONTRAST:  52mL OMNIPAQUE IOHEXOL 350 MG/ML SOLN COMPARISON:  CT 07/07/2022  FINDINGS: Lower chest: Mild cardiomegaly. Trace pericardial effusion. Small left pleural effusion unchanged. Left basilar atelectasis/scarring. Hepatobiliary: Hepatic cysts. No follow-up imaging recommended. No evidence of cholecystitis. No biliary dilation. Pancreas: 2 cm homogeneous rounded cystic lesion in the body/tail of the pancreas which is unchanged and could represent a pseudocyst, serous or mucinous cystadenoma or side branch IPMN. MRI without and with contrast is recommended when clinically feasible. No pancreatic ductal dilation. Spleen: Normal in size without focal abnormality. Adrenals/Urinary Tract: Unremarkable adrenal glands. Low-attenuation lesions in the kidneys are statistically likely to represent cysts. No follow-up is required. Punctate nonobstructing left nephrolithiasis. No hydronephrosis. The bladder is mildly distended and filled with gas and fluid some of which is layering posteriorly and may be hyperdense though evaluation is limited by streak artifact. Foley catheter in the bladder. 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 Normal caliber small bowel filled with enteric contrast. Questionable rectal wall thickening and adjacent stranding/fluid though this area is poorly evaluated due to streak artifact. Right lower quadrant ileostomy. Vascular/Lymphatic: Aortic atherosclerosis. No enlarged abdominal or pelvic lymph nodes. Reproductive: Mild enlargement of the prostate. Other: Right abdominal percutaneous drain within a intermediate density fluid collection along the anterior right abdominal wall tracking inferiorly to the superior aspect of the bladder and superiorly to the undersurface of the right hepatic lobe. This measures approximately 3.6 x 2.0 x 11.4 cm (series 2/image 39 and series 5/image 29). This is slightly decreased in size compared to 07/07/2022 when it measured approximately 4.2 x 2.4 x 15 cm. The gaseous component of the fluid collection has resolved.  Mesenteric edema. Small amount of free fluid in the pelvis. Body wall anasarca. Musculoskeletal: Left TKA. No acute osseous abnormality. Advanced thoracolumbar spondylosis. Grade 1 anterolisthesis L4. IMPRESSION: 1. The percutaneous right abdominal wall pigtail catheter remains in the fluid collection in the right hemiabdomen which has slightly decreased in size. 2. The bladder is catheterized but remains distended with air and gas. Questionable layering hyperdense fluid within  the posterior bladder though this is obscured by streak artifact. Recommend clinical correlation for catheter dysfunction and possible blood clot within the bladder. 3. Thickened bladder wall.  Cystitis not excluded. 4. 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 5. The rectum may be mildly thickened with adjacent stranding/fluid. Evaluation is limited by streak artifact. Findings may be due to congestion or proctitis. 6. 2 cm cystic lesion at the junction of the body and tail of the pancreas. MRI without and with contrast is recommended when clinically feasible. Benign and malignant etiologies are possible. 7. Mesenteric edema and small amount of free fluid in the pelvis. Marked body wall anasarca. Findings may be related to fluid overload. 8. Improved left lower lobe pneumonia. Residual small left pleural effusion. 9. Coronary artery and Aortic Atherosclerosis (ICD10-I70.0). Electronically Signed   By: Minerva Festeryler  Stutzman M.D.   On: 07/13/2022 22:02    Scheduled Meds:   stroke: early stages of recovery book   Does not apply Once   Chlorhexidine Gluconate Cloth  6 each Topical Daily   enoxaparin (LOVENOX) injection  40 mg Subcutaneous Q24H   finasteride  5 mg Oral Daily   mouth rinse  15 mL Mouth Rinse Q2H   sertraline  25 mg Oral Daily   tamsulosin  0.4 mg Oral Daily   traZODone  50 mg Oral BID   Continuous Infusions:  sodium chloride Stopped (07/08/22 2225)     LOS: 7 days   Time spent: 35  minutes   Mckensie Scotti Estill Cotta Dorise Gangi, MD [facility]  If 7PM-7AM, please contact night-coverage [email or web address] 07/14/2022, 12:59 PM

## 2022-07-14 NOTE — Plan of Care (Signed)
  Problem: Clinical Measurements: Goal: Diagnostic test results will improve Outcome: Progressing   Problem: Respiratory: Goal: Ability to maintain adequate ventilation will improve Outcome: Progressing   Problem: Education: Goal: Knowledge of General Education information will improve Description: Including pain rating scale, medication(s)/side effects and non-pharmacologic comfort measures Outcome: Progressing   

## 2022-07-14 NOTE — Progress Notes (Addendum)
1502 Per Pahwani in attempt to remove foley, bladder training is to be done. Foley cath clamped at this time.  1600 Foley unclamped, only 109ml output drained

## 2022-07-15 DIAGNOSIS — R6520 Severe sepsis without septic shock: Secondary | ICD-10-CM | POA: Diagnosis not present

## 2022-07-15 DIAGNOSIS — A419 Sepsis, unspecified organism: Secondary | ICD-10-CM | POA: Diagnosis not present

## 2022-07-15 LAB — BASIC METABOLIC PANEL
Anion gap: 6 (ref 5–15)
BUN: 12 mg/dL (ref 8–23)
CO2: 24 mmol/L (ref 22–32)
Calcium: 10 mg/dL (ref 8.9–10.3)
Chloride: 113 mmol/L — ABNORMAL HIGH (ref 98–111)
Creatinine, Ser: 1.04 mg/dL (ref 0.61–1.24)
GFR, Estimated: >60 mL/min (ref >60)
Glucose, Bld: 93 mg/dL (ref 70–99)
Potassium: 3.6 mmol/L (ref 3.5–5.1)
Sodium: 143 mmol/L (ref 135–145)

## 2022-07-15 LAB — CBC
HCT: 25.3 % — ABNORMAL LOW (ref 39.0–52.0)
Hemoglobin: 8.4 g/dL — ABNORMAL LOW (ref 13.0–17.0)
MCH: 28.4 pg (ref 26.0–34.0)
MCHC: 33.2 g/dL (ref 30.0–36.0)
MCV: 85.5 fL (ref 80.0–100.0)
Platelets: 174 10*3/uL (ref 150–400)
RBC: 2.96 MIL/uL — ABNORMAL LOW (ref 4.22–5.81)
RDW: 14.7 % (ref 11.5–15.5)
WBC: 8.3 10*3/uL (ref 4.0–10.5)
nRBC: 0 % (ref 0.0–0.2)

## 2022-07-15 NOTE — Progress Notes (Signed)
Patient is alert to self only. Mittens placed due to him pulling at devices. Only put out 275 mls of urine-bladder scanned and only had 65 mls of residual. No bladder distention felt. Can become aggressive and "push back" when giving care. No pain indicators were present.

## 2022-07-15 NOTE — Plan of Care (Signed)
  Problem: Respiratory: Goal: Ability to maintain adequate ventilation will improve Outcome: Progressing   Problem: Nutrition: Goal: Adequate nutrition will be maintained Outcome: Progressing   Problem: Pain Managment: Goal: General experience of comfort will improve Outcome: Progressing

## 2022-07-15 NOTE — Progress Notes (Signed)
Walked into patient's room to give meds and he had pulled foley apart and had it in hands.Placed mittens back onto both of patient's hands and reconnected foley. Patient is confused and stated that he was going to call the police because I was stealing his credit card (the foley that I removed from his hands). Reoriented the patient but he did not accept it.

## 2022-07-15 NOTE — Progress Notes (Addendum)
PROGRESS NOTE    Carl RipaBilly Carlye Figueroa  QIO:[id number] DOB: 10-25-1945 DOA: 07/07/2022 PCP: Mick SellFitzgerald, David P, MD   Brief Narrative:  76 year old with history of hypertension, dementia  CKD 3a, who lives in a nursing facility , recently discharged on 11/17 from [facility] appendicitis and intra-abdominal abscesses where he underwent laparotomy with  total colectomy and ileostomy placement and discharged with Foley cathete, ileostomy  and JP drain brought to the ED for staring episode x2 since arrival home, once on the day of discharge on 11/17 and another on 11/18.  He was unresponsive to his name during the episode.  Wife was concerned about a seizure and called EMS .  On presentation he was hypotensive but responded to IV fluids.  Lab work showed leukocytosis, lactic acidosis, AKI.  CT head did not show any acute findings.  CT chest/abdomen/pelvis showed left lower lobe pneumonia, possible aspiration, diffuse mesenteric haziness.  Patient was started on broad-spectrum antibiotics for the suspicion of severe sepsis secondary to left lower lobe pneumonia.  Hospital course also remarkable for confusion, agitation but now has improved.  PT/OT recommending SNF.  Antibiotics has been changed to oral.  He is medically stable to return to skilled nursing facility whenever possible. Discharge delayed due to ThanksGiving holidays.TOC closely following  Per [facility] note: He " patient was discharged with Foley catheter, right lower quadrant percutaneous drain, ileostomy, he [facility]    Assessment & Plan:  Severe sepsis secondary to left lower lobe pneumonia: Sepsis physiology resolved.  Afebrile, leukocytosis resolved, culture data unremarkable treated for pneumonia and sepsis.  Finished Augmentin course for x 7 days    Encephalopathy/staring episodes/history of dementia: -Underwent extensive work-up including CT head negative/ EEG showed moderate  to severe diffuse slowing indicative of global cerebral dysfunction, no epileptiform abnormality seen.   -Likely from sepsis, mentation improved and now close to baseline with history of dementia short-term memory problem  -continue delirium precaution, fall precaution, frequent reorientation  -minimize benzodiazepine opiates  -continue home zoloft  and trazodone-Lives in nursing facility -PT/OT recommended SNF   Status post ileostomy-  -He had laparotomy with near-total colectomy on 05/30/2022 for appendicitis with intra-abdominal abscess, bowel perforation.  [facility], seen by general surgery for concern of infection from intra-abdominal source which has been ruled out.He is making solid stool in the colostomy bag. -Finished antibiotics course with vanco and Augmentin -Repeat CT scan of abdomen/pelvis on 07/09/2022 shows decrease in size of fluid collection -Discussed with general surgery Dr. Claudine Moutonodenberg via secure chat.  Recommended continuous irrigation and follow-up with [facility]  Chronic urinary retention: [facility] we will continue the same. [facility]  Continue On Proscar, Flomax.[facility] with [facility] [place] next week as per wife.    Hypertension: Blood pressure is controlled.  Continue to hold lisinopril at this time.  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 Could foley dysfunction? Continue IV fluids.  Kidney function at baseline. -secure chat sent to Urology on call Dr. Chyrl CivatteBrandon-she will come and evaluate the patient.  Right index finger tip gangrene: -noted on exam today. As per wife its getting worse and painful. Noticed 3 weeks ago. -Consulted vascular-will see  him  tomorrow.  Pressure injury coccyx stage II POA see below     Pressure Injury 07/08/22 Coccyx Bilateral Stage 2 -  Partial thickness loss of dermis presenting as a shallow open injury with a red, pink wound bed without slough. (Active)  07/08/22 1534  Location: Coccyx  Location Orientation: Bilateral  Staging: Stage 2 -  Partial thickness loss of dermis presenting as a shallow open injury with a red, pink wound bed without slough.  Wound Description (Comments):   Present on Admission: Yes  Dressing Type Foam - Lift dressing to assess site every shift     DVT prophylaxis: Lovenox Code Status: Full code Family Communication:  None present at bedside.  Plan of care discussed with patient in length and he verbalized understanding and agreed with it.  I called patient's wife on 11/25 and 11/26 and discussed plan of care and she verbalized understanding.  Disposition Plan: SNF  Consultants:  General surgery Urology Vascular surgery  Procedures:  None  Antimicrobials:  See above  Status is: Inpatient    Subjective: Patient seen and examined.  Sleeping comfortably on the bed.  He denies any complaints.  No fever overnight.  No acute events overnight.  Wife reported that patient has Lewy body dementia and he is usually better in morning time and he gets confused and more delirious at nighttime and that is his baseline.  He has black discoloration of right index finger tip which wife noticed since 3 weeks however seems to be getting worse and associated with pain.  Objective: Vitals:   07/14/22 0757 07/14/22 1528 07/14/22 2337 07/15/22 0936  BP: 117/64 114/65 (!) 102/56 124/66  Pulse: 81 97 83 80  Resp:  20  17  Temp: (!) 97.5 F (36.4 C) 98.3 F (36.8 C) 98.2 F (36.8 C) 98 F (36.7 C)  TempSrc:      SpO2: 100% 93% 96%   Weight:      Height:        Intake/Output Summary (Last 24 hours) at 07/15/2022 1408 Last data filed at 07/15/2022 1113 Gross per 24 hour  Intake  340 ml  Output 1030 ml  Net -690 ml    Filed Weights   07/07/22 1626 07/09/22 2209  Weight: 68 kg 55.7 kg    Examination:  General exam: Appears calm and comfortable, sleeping comfortably, on room air, appears weak Respiratory system: Clear to auscultation. Respiratory effort normal. Cardiovascular system: S1 & S2 heard, RRR. No JVD, murmurs, rubs, gallops or clicks. No pedal edema. Gastrointestinal system: Abdomen soft, colostomy bag intact.  Brown stool noted in the bag.  Drain intact.  Small amount of bloody drainage noted.   Central nervous system: Sleepy but arousable with verbal command. skin:       Data Reviewed: I have personally reviewed following labs and imaging studies  CBC: Recent Labs  Lab 07/09/22 0231 07/10/22 0556 07/15/22 0430  WBC 11.5* 9.0 8.3  HGB 8.3* 8.1* 8.4*  HCT 25.4* 24.7* 25.3*  MCV 86.1 86.1 85.5  PLT 261 242 174    Basic Metabolic Panel: Recent Labs  Lab 07/09/22 0231 07/09/22 0630 07/10/22 0556 07/12/22 0532 07/14/22 0456 07/15/22 0430  NA 145  --  147* 141  --  143  K 4.7 4.5 4.0 3.9  --  3.6  CL 114*  --  115* 113*  --  113*  CO2 26  --  27 24  --  24  GLUCOSE 79  --  98 87  --  93  BUN 20  --  16 12  --  12  CREATININE 1.40*  --  1.14 1.04 0.97 1.04  CALCIUM 10.1  --  10.0 9.8  --  10.0    GFR: Estimated Creatinine Clearance: 47.6 mL/min (by C-G formula based on SCr of 1.04 mg/dL). Liver Function Tests: No results for input(s): "AST", "ALT", "ALKPHOS", "BILITOT", "PROT", "ALBUMIN" in the last 168 hours.  No results for input(s): "LIPASE", "AMYLASE" in the last 168 hours. No results for input(s): "AMMONIA" in the last 168 hours. Coagulation Profile: No results for input(s): "INR", "PROTIME" in the last 168 hours. Cardiac Enzymes: No results for input(s): "CKTOTAL", "CKMB", "CKMBINDEX", "TROPONINI" in the last 168 hours. BNP (last 3 results) No results for input(s): "PROBNP" in the last 8760 hours. HbA1C: No  results for input(s): "HGBA1C" in the last 72 hours. CBG: No results for input(s): "GLUCAP" in the last 168 hours. Lipid Profile: No results for input(s): "CHOL", "HDL", "LDLCALC", "TRIG", "CHOLHDL", "LDLDIRECT" in the last 72 hours. Thyroid Function Tests: No results for input(s): "TSH", "T4TOTAL", "FREET4", "T3FREE", "THYROIDAB" in the last 72 hours. Anemia Panel: No results for input(s): "VITAMINB12", "FOLATE", "FERRITIN", "TIBC", "IRON", "RETICCTPCT" in the last 72 hours. Sepsis Labs: Recent Labs  Lab 07/09/22 0840  LATICACIDVEN 1.0     Recent Results (from the past 240 hour(s))  Gastrointestinal Panel by PCR , Stool     Status: None   Collection Time: 07/07/22  4:33 PM   Specimen: Stool  Result Value Ref Range Status   Campylobacter species NOT DETECTED NOT DETECTED Final   Plesimonas shigelloides NOT DETECTED NOT DETECTED Final   Salmonella species NOT DETECTED NOT DETECTED Final   Yersinia enterocolitica NOT DETECTED NOT DETECTED Final   Vibrio species NOT DETECTED NOT DETECTED Final   Vibrio cholerae NOT DETECTED NOT DETECTED Final   Enteroaggregative E coli (EAEC) NOT DETECTED NOT DETECTED Final   Enteropathogenic E coli (EPEC) NOT DETECTED NOT DETECTED Final   Enterotoxigenic E coli (ETEC) NOT DETECTED NOT DETECTED Final   Shiga like toxin producing E coli (STEC) NOT DETECTED NOT DETECTED Final   Shigella/Enteroinvasive E coli (EIEC) NOT DETECTED NOT DETECTED Final   Cryptosporidium NOT DETECTED NOT DETECTED Final   Cyclospora cayetanensis NOT DETECTED NOT DETECTED Final   Entamoeba histolytica NOT DETECTED NOT DETECTED Final   Giardia lamblia NOT DETECTED NOT DETECTED Final   Adenovirus F40/41 NOT DETECTED NOT DETECTED Final   Astrovirus NOT DETECTED NOT DETECTED Final   Norovirus GI/GII NOT DETECTED NOT DETECTED Final   Rotavirus A NOT DETECTED NOT DETECTED Final   Sapovirus (I, II, IV, and V) NOT DETECTED NOT DETECTED Final    Comment: Performed at [facility], [address] [place], [place] [address]  C Difficile Quick Screen w PCR reflex     Status: None   Collection Time: 07/07/22  4:33 PM   Specimen: Stool  Result Value Ref Range Status   C Diff antigen NEGATIVE NEGATIVE Final   C Diff toxin NEGATIVE NEGATIVE Final   C Diff interpretation No C. difficile detected.  Final    Comment: Performed at [facility], [address] [place], [place] [address]  Culture, blood (single)     Status: None   Collection Time: 07/07/22  6:34 PM   Specimen: BLOOD RIGHT ARM  Result Value Ref Range Status   Specimen Description BLOOD RIGHT ARM  Final   Special Requests   Final    BOTTLES DRAWN  AEROBIC AND ANAEROBIC Blood Culture results may not be optimal due to an inadequate volume of blood received in culture bottles   Culture   Final    NO GROWTH 5 DAYS Performed at [facility], [address] [place], [place] [address]    Report Status 07/12/2022 FINAL  Final  MRSA Next Gen by PCR, Nasal     Status: None   Collection Time: 07/09/22  3:49 PM   Specimen: Nasal Mucosa; Nasal Swab  Result Value Ref Range Status   MRSA by PCR Next Gen NOT DETECTED NOT DETECTED Final    Comment: (NOTE) The GeneXpert MRSA Assay (FDA approved for NASAL specimens only), is one component of a comprehensive MRSA colonization surveillance program. It is not intended to diagnose MRSA infection nor to guide or monitor treatment for MRSA infections. Test performance is not FDA approved in patients less than 83 years old. Performed at [facility], [address] [place], [place] [address]       Radiology Studies: CT ABDOMEN PELVIS W CONTRAST  Result Date: 07/13/2022 CLINICAL DATA:  Acute nonlocalized abdominal pain; drain in place; follow-up imaging for drain management EXAM: CT ABDOMEN AND PELVIS WITH CONTRAST TECHNIQUE: Multidetector CT imaging of the abdomen and pelvis was performed using the standard protocol following  bolus administration of intravenous contrast. RADIATION DOSE REDUCTION: This exam was performed according to the departmental dose-optimization program which includes automated exposure control, adjustment of the mA and/or kV according to patient size and/or use of iterative reconstruction technique. CONTRAST:  22mL OMNIPAQUE IOHEXOL 350 MG/ML SOLN COMPARISON:  CT 07/07/2022 FINDINGS: Lower chest: Mild cardiomegaly. Trace pericardial effusion. Small left pleural effusion unchanged. Left basilar atelectasis/scarring. Hepatobiliary: Hepatic cysts. No follow-up imaging recommended. No evidence of cholecystitis. No biliary dilation. Pancreas: 2 cm homogeneous rounded cystic lesion in the body/tail of the pancreas which is unchanged and could represent a pseudocyst, serous or mucinous cystadenoma or side branch IPMN. MRI without and with contrast is recommended when clinically feasible. No pancreatic ductal dilation. Spleen: Normal in size without focal abnormality. Adrenals/Urinary Tract: Unremarkable adrenal glands. Low-attenuation lesions in the kidneys are statistically likely to represent cysts. No follow-up is required. Punctate nonobstructing left nephrolithiasis. No hydronephrosis. The bladder is mildly distended and filled with gas and fluid some of which is layering posteriorly and may be hyperdense though evaluation is limited by streak artifact. Foley catheter in the bladder. 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 Normal caliber small bowel filled with enteric contrast. Questionable rectal wall thickening and adjacent stranding/fluid though this area is poorly evaluated due to streak artifact. Right lower quadrant ileostomy. Vascular/Lymphatic: Aortic atherosclerosis. No enlarged abdominal or pelvic lymph nodes. Reproductive: Mild enlargement of the prostate. Other: Right abdominal percutaneous drain within a intermediate density fluid collection along the anterior right abdominal wall  tracking inferiorly to the superior aspect of the bladder and superiorly to the undersurface of the right hepatic lobe. This measures approximately 3.6 x 2.0 x 11.4 cm (series 2/image 39 and series 5/image 29). This is slightly decreased in size compared to 07/07/2022 when it measured approximately 4.2 x 2.4 x 15 cm. The gaseous component of the fluid collection has resolved. Mesenteric edema. Small amount of free fluid in the pelvis. Body wall anasarca. Musculoskeletal: Left TKA. No acute osseous abnormality. Advanced thoracolumbar spondylosis. Grade 1 anterolisthesis L4. IMPRESSION: 1. The percutaneous right abdominal wall pigtail catheter remains in the fluid collection in the right hemiabdomen which has slightly decreased in size. 2. The  bladder is catheterized but remains distended with air and gas. Questionable layering hyperdense fluid within the posterior bladder though this is obscured by streak artifact. Recommend clinical correlation for catheter dysfunction and possible blood clot within the bladder. 3. Thickened bladder wall.  Cystitis not excluded. 4. 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 5. The rectum may be mildly thickened with adjacent stranding/fluid. Evaluation is limited by streak artifact. Findings may be due to congestion or proctitis. 6. 2 cm cystic lesion at the junction of the body and tail of the pancreas. MRI without and with contrast is recommended when clinically feasible. Benign and malignant etiologies are possible. 7. Mesenteric edema and small amount of free fluid in the pelvis. Marked body wall anasarca. Findings may be related to fluid overload. 8. Improved left lower lobe pneumonia. Residual small left pleural effusion. 9. Coronary artery and Aortic Atherosclerosis (ICD10-I70.0). Electronically Signed   By: Minerva Fester M.D.   On: 07/13/2022 22:02    Scheduled Meds:   stroke: early stages of recovery book   Does not apply Once    Chlorhexidine Gluconate Cloth  6 each Topical Daily   enoxaparin (LOVENOX) injection  40 mg Subcutaneous Q24H   finasteride  5 mg Oral Daily   mouth rinse  15 mL Mouth Rinse Q2H   sertraline  25 mg Oral Daily   tamsulosin  0.4 mg Oral Daily   traZODone  50 mg Oral BID   Continuous Infusions:  sodium chloride Stopped (07/08/22 2225)   sodium chloride 75 mL/hr at 07/14/22 1931     LOS: 8 days   Time spent: 35 minutes   Analyah Mcconnon Estill Cotta, MD [facility]  If 7PM-7AM, please contact night-coverage [email or web address] 07/15/2022, 2:08 PM

## 2022-07-15 NOTE — Plan of Care (Signed)
Problem: Fluid Volume: Goal: Hemodynamic stability will improve 07/15/2022 0732 by Ethel Rana, RN Outcome: Progressing 07/15/2022 0731 by Ethel Rana, RN Outcome: Not Progressing   Problem: Clinical Measurements: Goal: Diagnostic test results will improve 07/15/2022 0732 by Ethel Rana, RN Outcome: Progressing 07/15/2022 0731 by Ethel Rana, RN Outcome: Not Progressing Goal: Signs and symptoms of infection will decrease 07/15/2022 0732 by Ethel Rana, RN Outcome: Progressing 07/15/2022 0731 by Ethel Rana, RN Outcome: Not Progressing   Problem: Respiratory: Goal: Ability to maintain adequate ventilation will improve 07/15/2022 0732 by Ethel Rana, RN Outcome: Progressing 07/15/2022 0731 by Ethel Rana, RN Outcome: Not Progressing   Problem: Education: Goal: Knowledge of disease or condition will improve 07/15/2022 0732 by Ethel Rana, RN Outcome: Progressing 07/15/2022 0731 by Ethel Rana, RN Outcome: Not Progressing Goal: Knowledge of secondary prevention will improve (MUST DOCUMENT ALL) 07/15/2022 0732 by Ethel Rana, RN Outcome: Progressing 07/15/2022 0731 by Ethel Rana, RN Outcome: Not Progressing Goal: Knowledge of patient specific risk factors will improve Loraine Leriche N/A or DELETE if not current risk factor) 07/15/2022 0732 by Ethel Rana, RN Outcome: Progressing 07/15/2022 0731 by Ethel Rana, RN Outcome: Not Progressing   Problem: Ischemic Stroke/TIA Tissue Perfusion: Goal: Complications of ischemic stroke/TIA will be minimized 07/15/2022 0732 by Ethel Rana, RN Outcome: Progressing 07/15/2022 0731 by Ethel Rana, RN Outcome: Not Progressing   Problem: Coping: Goal: Will verbalize positive feelings about self 07/15/2022 0732 by Ethel Rana, RN Outcome: Progressing 07/15/2022 0731 by Ethel Rana, RN Outcome: Not Progressing Goal: Will identify appropriate support  needs 07/15/2022 0732 by Ethel Rana, RN Outcome: Progressing 07/15/2022 0731 by Ethel Rana, RN Outcome: Not Progressing   Problem: Health Behavior/Discharge Planning: Goal: Ability to manage health-related needs will improve 07/15/2022 0732 by Ethel Rana, RN Outcome: Progressing 07/15/2022 0731 by Ethel Rana, RN Outcome: Not Progressing Goal: Goals will be collaboratively established with patient/family 07/15/2022 0732 by Ethel Rana, RN Outcome: Progressing 07/15/2022 0731 by Ethel Rana, RN Outcome: Not Progressing   Problem: Self-Care: Goal: Ability to participate in self-care as condition permits will improve 07/15/2022 0732 by Ethel Rana, RN Outcome: Progressing 07/15/2022 0731 by Ethel Rana, RN Outcome: Not Progressing Goal: Verbalization of feelings and concerns over difficulty with self-care will improve 07/15/2022 0732 by Ethel Rana, RN Outcome: Progressing 07/15/2022 0731 by Ethel Rana, RN Outcome: Not Progressing Goal: Ability to communicate needs accurately will improve 07/15/2022 0732 by Ethel Rana, RN Outcome: Progressing 07/15/2022 0731 by Ethel Rana, RN Outcome: Not Progressing   Problem: Nutrition: Goal: Risk of aspiration will decrease 07/15/2022 0732 by Ethel Rana, RN Outcome: Progressing 07/15/2022 0731 by Ethel Rana, RN Outcome: Not Progressing Goal: Dietary intake will improve 07/15/2022 0732 by Ethel Rana, RN Outcome: Progressing 07/15/2022 0731 by Ethel Rana, RN Outcome: Not Progressing   Problem: Education: Goal: Knowledge of General Education information will improve Description: Including pain rating scale, medication(s)/side effects and non-pharmacologic comfort measures 07/15/2022 0732 by Ethel Rana, RN Outcome: Progressing 07/15/2022 0731 by Ethel Rana, RN Outcome: Not Progressing   Problem: Health Behavior/Discharge  Planning: Goal: Ability to manage health-related needs will improve 07/15/2022 0732 by Ethel Rana, RN Outcome: Progressing 07/15/2022 0731 by Ethel Rana, RN Outcome: Not Progressing   Problem: Clinical Measurements: Goal: Ability to maintain clinical measurements within normal limits will improve 07/15/2022  0732 by Ethel Rana, RN Outcome: Progressing 07/15/2022 0731 by Ethel Rana, RN Outcome: Not Progressing Goal: Will remain free from infection 07/15/2022 0732 by Ethel Rana, RN Outcome: Progressing 07/15/2022 0731 by Ethel Rana, RN Outcome: Not Progressing Goal: Diagnostic test results will improve 07/15/2022 0732 by Ethel Rana, RN Outcome: Progressing 07/15/2022 0731 by Ethel Rana, RN Outcome: Not Progressing Goal: Respiratory complications will improve 07/15/2022 0732 by Ethel Rana, RN Outcome: Progressing 07/15/2022 0731 by Ethel Rana, RN Outcome: Not Progressing Goal: Cardiovascular complication will be avoided 07/15/2022 0732 by Ethel Rana, RN Outcome: Progressing 07/15/2022 0731 by Ethel Rana, RN Outcome: Not Progressing   Problem: Activity: Goal: Risk for activity intolerance will decrease 07/15/2022 0732 by Ethel Rana, RN Outcome: Progressing 07/15/2022 0731 by Ethel Rana, RN Outcome: Not Progressing   Problem: Nutrition: Goal: Adequate nutrition will be maintained 07/15/2022 0732 by Ethel Rana, RN Outcome: Progressing 07/15/2022 0731 by Ethel Rana, RN Outcome: Not Progressing   Problem: Coping: Goal: Level of anxiety will decrease 07/15/2022 0732 by Ethel Rana, RN Outcome: Progressing 07/15/2022 0731 by Ethel Rana, RN Outcome: Not Progressing   Problem: Elimination: Goal: Will not experience complications related to bowel motility 07/15/2022 0732 by Ethel Rana, RN Outcome: Progressing 07/15/2022 0731 by Ethel Rana, RN Outcome:  Not Progressing Goal: Will not experience complications related to urinary retention 07/15/2022 0732 by Ethel Rana, RN Outcome: Progressing 07/15/2022 0731 by Ethel Rana, RN Outcome: Not Progressing   Problem: Pain Managment: Goal: General experience of comfort will improve 07/15/2022 0732 by Ethel Rana, RN Outcome: Progressing 07/15/2022 0731 by Ethel Rana, RN Outcome: Not Progressing   Problem: Safety: Goal: Ability to remain free from injury will improve 07/15/2022 0732 by Ethel Rana, RN Outcome: Progressing 07/15/2022 0731 by Ethel Rana, RN Outcome: Not Progressing   Problem: Skin Integrity: Goal: Risk for impaired skin integrity will decrease 07/15/2022 0732 by Ethel Rana, RN Outcome: Progressing 07/15/2022 0731 by Ethel Rana, RN Outcome: Not Progressing

## 2022-07-15 NOTE — Progress Notes (Signed)
Brief urology consult  Patient has a known history of urinary retention with indwelling [facility]  Hospitalist contacted urology, concern for oliguria.  CT scan reviewed, significant distortion from the patient's hip hardware, difficult to assess position of catheter.  Foley catheter bag was wrapped around patient's leg, once readjusted and unkinked, catheter began to flow, at least anot her 200 cc of urine output.  A gently flush the catheter 60 cc in and out, irrigates fine.  Assessment and plan:  Foley catheter appears to be draining well in good position.  Foley catheter bag was resecured dependently to the patient's left thigh.

## 2022-07-16 DIAGNOSIS — I76 Septic arterial embolism: Secondary | ICD-10-CM

## 2022-07-16 DIAGNOSIS — L8990 Pressure ulcer of unspecified site, unspecified stage: Secondary | ICD-10-CM | POA: Insufficient documentation

## 2022-07-16 DIAGNOSIS — R6520 Severe sepsis without septic shock: Secondary | ICD-10-CM | POA: Diagnosis not present

## 2022-07-16 DIAGNOSIS — A419 Sepsis, unspecified organism: Secondary | ICD-10-CM | POA: Diagnosis not present

## 2022-07-16 MED ORDER — QUETIAPINE FUMARATE 25 MG PO TABS
25.0000 mg | ORAL_TABLET | Freq: Every day | ORAL | Status: DC
  Administered 2022-07-16 – 2022-07-17 (×2): 25 mg via ORAL
  Filled 2022-07-16 (×2): qty 1

## 2022-07-16 NOTE — Progress Notes (Signed)
Occupational Therapy Treatment Patient Details Name: Carl Figueroa MRN: [id number] DOB: 1945-12-19 Today's Date: 07/16/2022   History of present illness Pt is a 76 y/o M admitted on 07/07/22. Pt's wife reports pt had 2 staring episodes following d/c home from [place] where pt was unresponsive to his name during the episode & wife was concerned about a seizure. Pt is being treated for LLL PNA & severe sepsis. PMH: HTN, lewy body dementia, CKD3A, 1 month admission at the [place] with d/c on 07/06/22 following tx for appendicitis & intraabdominal abscesses where he underwent laparotomy with colectomy & ileostomy placement, anxiety   OT comments  Carl Figueroa presents with flat, disoriented affect but was agreeable to OT session this date.  Patient declined ADL engagement, but was receptive to bed mobility training to relieve pressure on sacrum.  Patient reported pain on sacrum, was receptive to education from OT on benefits of frequent position changes.  Patient required moderate assist for rolling to L side x3.  OT provided positioning support for patient to maintain L sidelying position.  Carl Figueroa endorsed decreased pain with position change.  OT provided cues for re-orientation and safety considerations.  He will continue to benefit from skilled OT services in acute setting to support functional strengthening, cognition, safety and independence in ADLs.  Discharge recommendation remains appropriate.   Recommendations for follow up therapy are one component of a multi-disciplinary discharge planning process, led by the attending physician.  Recommendations may be updated based on patient status, additional functional criteria and insurance authorization.    Follow Up Recommendations  Skilled nursing-short term rehab (<3 hours/day)     Assistance Recommended at Discharge Frequent or constant Supervision/Assistance  Patient can return home with the following  Two people to help with walking and/or  transfers;Two people to help with bathing/dressing/bathroom   Equipment Recommendations   (defer to next level of care)    Recommendations for Other Services      Precautions / Restrictions Precautions Precautions: Fall Precaution Comments: ostomy bag, JP drain, foley Restrictions Weight Bearing Restrictions: No       Mobility Bed Mobility Overal bed mobility: Needs Assistance Bed Mobility: Rolling Rolling: Mod assist           Patient Response: Flat affect  Transfers                         Balance Overall balance assessment: Needs assistance                                         ADL either performed or assessed with clinical judgement   ADL Overall ADL's : Needs assistance/impaired                                            Extremity/Trunk Assessment Upper Extremity Assessment Upper Extremity Assessment: Generalized weakness;Difficult to assess due to impaired cognition   Lower Extremity Assessment Lower Extremity Assessment: Generalized weakness;Difficult to assess due to impaired cognition        Vision Patient Visual Report: No change from baseline     Perception     Praxis      Cognition Arousal/Alertness: Awake/alert Behavior During Therapy: Flat affect Overall Cognitive Status: Impaired/Different from baseline Area of Impairment: Orientation, Memory,  Following commands, Safety/judgement, Problem solving                 Orientation Level: Disoriented to, Place, Time, Situation   Memory: Decreased short-term memory, Decreased recall of precautions Following Commands: Follows one step commands inconsistently, Follows one step commands with increased time Safety/Judgement: Decreased awareness of safety, Decreased awareness of deficits   Problem Solving: Slow processing, Decreased initiation, Difficulty sequencing, Requires verbal cues, Requires tactile cues General Comments: Pt responds to  name, verbally communicates with OT but with fluctuating ability to follow commands        Exercises Other Exercises Other Exercises: provided education re: benefits of frequent position changes to relieve pressure and pain on sacrum    Shoulder Instructions       General Comments      Pertinent Vitals/ Pain       Pain Assessment Pain Assessment: Faces Faces Pain Scale: Hurts little more Pain Location: bottom Pain Intervention(s): Limited activity within patient's tolerance, Monitored during session, Repositioned  Home Living                                          Prior Functioning/Environment              Frequency  Min 2X/week        Progress Toward Goals  OT Goals(current goals can now be found in the care plan section)  Progress towards OT goals: Progressing toward goals  Acute Rehab OT Goals Patient Stated Goal: reduce pain OT Goal Formulation: With patient Time For Goal Achievement: 07/22/22 Potential to Achieve Goals: Good  Plan Discharge plan remains appropriate;Frequency remains appropriate    Co-evaluation                 AM-PAC OT "6 Clicks" Daily Activity     Outcome Measure   Help from another person eating meals?: A Lot Help from another person taking care of personal grooming?: A Lot Help from another person toileting, which includes using toliet, bedpan, or urinal?: Total Help from another person bathing (including washing, rinsing, drying)?: Total Help from another person to put on and taking off regular upper body clothing?: A Lot Help from another person to put on and taking off regular lower body clothing?: A Lot 6 Click Score: 10    End of Session Equipment Utilized During Treatment: Oxygen  OT Visit Diagnosis: Unsteadiness on feet (R26.81);Muscle weakness (generalized) (M62.81)   Activity Tolerance Patient limited by fatigue   Patient Left in bed;with call bell/phone within reach;with bed alarm  set   Nurse Communication          Time: [id number] OT Time Calculation (min): 8 min  Charges: OT General Charges $OT Visit: 1 Visit OT Treatments $Therapeutic Activity: 8-22 mins  Dennison Nancy, OTR/L 07/16/22, 10:19 AM

## 2022-07-16 NOTE — Plan of Care (Signed)
  Problem: Fluid Volume: Goal: Hemodynamic stability will improve Outcome: Progressing   Problem: Respiratory: Goal: Ability to maintain adequate ventilation will improve Outcome: Progressing   Problem: Self-Care: Goal: Ability to participate in self-care as condition permits will improve Outcome: Progressing   Problem: Nutrition: Goal: Risk of aspiration will decrease Outcome: Progressing

## 2022-07-16 NOTE — Progress Notes (Signed)
Brief Note Patient with history of surgery at [facility], with drain, seen intermittently by our service this admission  Drain output very low; serosanguinous Being asked to remove drain  Drain removed at bedside without issue Dressing placed Recommend leaving dressing in place for ~48 hours; okay to change as needed  We will remain available if any further issues arise  -- Lynden Oxford, PA-C [facility] 07/16/2022, 2:17 PM M-F: 7am - 4pm

## 2022-07-16 NOTE — Consult Note (Signed)
[facility] Vascular Consult Note  MRN : [id number]  Carl Figueroa is a 76 y.o. (March 25, 1946) male who presents with chief complaint of  Chief Complaint  Patient presents with   Altered Mental Status  .   Consulting Physician: Dr. Shelly Coss MD Reason for consult:Right Index Finger Ischemia History of Present Illness: Patient is a 76 year old African-American male with a history of hypertension, dementia, CKD 3A, who lives in a nursing facility.  He was recently discharged on 07/06/2022 from a month-long admission at the [facility] for appendicitis with intra-abdominal abscesses.  He underwent a laparotomy with colectomy and ileostomy.  He was brought back to the ED at [facility] for staring episodes x2 since arriving at home.  He was unresponsive to his name during this episode in which he presented with leukocytosis, lactic acidosis, AKI.  CT the head was done that did not show any acute findings.  A CT of the chest abdomen and pelvis showed a left lower lobe pneumonia, possible aspiration, diffuse mesenteric haziness.  He was placed on antibiotics for suspicion of severe sepsis secondary to his left lower lobe pneumonia.  During this timeframe he developed what appears to be some type of ischemia to his right fingers.  Upon his admission here he is noted to have questionable gangrene/ischemia to his right index finger.  Vein and vascular surgery were consulted to evaluate.  Current Facility-Administered Medications  Medication Dose Route Frequency Provider Last Rate Last Admin    stroke: early stages of recovery book   Does not apply Once Judd Gaudier V, MD       0.9 %  sodium chloride infusion   Intravenous PRN Emeterio Reeve, DO   Stopped at 07/08/22 2225   acetaminophen (TYLENOL) tablet 650 mg  650 mg Oral Q4H PRN Athena Masse, MD   650 mg at 07/14/22 1610   Or   acetaminophen (TYLENOL) suppository 650 mg  650 mg Rectal Q4H PRN Athena Masse, MD        Chlorhexidine Gluconate Cloth 2 % PADS 6 each  6 each Topical Daily Shelly Coss, MD   6 each at 07/16/22 1000   enoxaparin (LOVENOX) injection 40 mg  40 mg Subcutaneous Q24H Judd Gaudier V, MD   40 mg at 07/15/22 2027   finasteride (PROSCAR) tablet 5 mg  5 mg Oral Daily Shelly Coss, MD   5 mg at 07/16/22 0925   LORazepam (ATIVAN) injection 2 mg  2 mg Intravenous Q5 Min x 2 PRN Athena Masse, MD       ondansetron [facility]) tablet 4 mg  4 mg Oral Q6H PRN Athena Masse, MD       Or   ondansetron [facility]) injection 4 mg  4 mg Intravenous Q6H PRN Athena Masse, MD       Oral care mouth rinse  15 mL Mouth Rinse Q2H Athena Masse, MD   15 mL at 07/16/22 1217   Oral care mouth rinse  15 mL Mouth Rinse PRN Athena Masse, MD       QUEtiapine (SEROQUEL) tablet 25 mg  25 mg Oral QHS Adhikari, Amrit, MD       sertraline (ZOLOFT) tablet 25 mg  25 mg Oral Daily Shelly Coss, MD   25 mg at 07/16/22 0925   tamsulosin (FLOMAX) capsule 0.4 mg  0.4 mg Oral Daily Shelly Coss, MD   0.4 mg at 07/16/22 [id number]    Past Medical History:  Diagnosis  Date   Anxiety    Arthritis    Hypertension    Lewy body dementia [facility])     Past Surgical History:  Procedure Laterality Date   COLONOSCOPY     FOOT SURGERY     X2   HERNIA REPAIR Right    INGUINAL    perforated bowel  05/2022   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   Procedure: TOTAL HIP ARTHROPLASTY ANTERIOR APPROACH;  Surgeon: Hessie Knows, MD;  Location: [facility] ORS;  Service: Orthopedics;  Laterality: Left;    Social History Social History   Tobacco Use   Smoking status: Former    Packs/day: 0.25    Years: 20.00    Total pack years: 5.00    Types: Cigarettes    Quit date: 10/28/2019    Years since quitting: 2.7   Smokeless tobacco: Never  Vaping Use   Vaping Use: Never used  Substance Use Topics   Alcohol use: No   Drug use: No    Family History Family History  Problem Relation Age of Onset   Cancer Mother    Diabetes  Mellitus II Neg Hx     Allergies  Allergen Reactions   Morphine Nausea Only     REVIEW OF SYSTEMS (Negative unless checked)  Constitutional: [] Weight loss  [] Fever  [] Chills Cardiac: [] Chest pain   [] Chest pressure   [] Palpitations   [] Shortness of breath when laying flat   [] Shortness of breath at rest   [] Shortness of breath with exertion. Vascular:  [] Pain in legs with walking   [] Pain in legs at rest   [] Pain in legs when laying flat   [] Claudication   [] Pain in feet when walking  [] Pain in feet at rest  [] Pain in feet when laying flat   [] History of DVT   [] Phlebitis   [] Swelling in legs   [] Varicose veins   [] Non-healing ulcers Pulmonary:   [] Uses home oxygen   [] Productive cough   [] Hemoptysis   [] Wheeze  [] COPD   [] Asthma Neurologic:  [] Dizziness  [] Blackouts   [] Seizures   [] History of stroke   [] History of TIA  [] Aphasia   [] Temporary blindness   [] Dysphagia   [] Weakness or numbness in arms   [] Weakness or numbness in legs Musculoskeletal:  [] Arthritis   [] Joint swelling   [] Joint pain   [] Low back pain Hematologic:  [] Easy bruising  [] Easy bleeding   [] Hypercoagulable state   [] Anemic  [] Hepatitis Gastrointestinal:  [] Blood in stool   [] Vomiting blood  [] Gastroesophageal reflux/heartburn   [] Difficulty swallowing. Genitourinary:  [] Chronic kidney disease   [] Difficult urination  [] Frequent urination  [] Burning with urination   [] Blood in urine Skin:  [] Rashes   [] Ulcers   [] Wounds Psychological:  [] History of anxiety   []  History of major depression.  Physical Examination  Vitals:   07/15/22 1620 07/15/22 1940 07/15/22 2349 07/16/22 0800  BP: (!) 113/59 119/64 (!) 122/57 133/66  Pulse: 85 90 87 81  Resp: 16 18 20 18   Temp: 98.3 F (36.8 C) 98.2 F (36.8 C) 97.6 F (36.4 C) 97.7 F (36.5 C)  TempSrc:  Oral    SpO2: 100% 100%  100%  Weight:      Height:       Body mass index is 19.25 kg/m. Gen:  WD/WN, NAD Head: [place]/AT, No temporalis wasting. Prominent temp pulse  not noted. Ear/Nose/Throat: Hearing grossly intact, nares w/o erythema or drainage, oropharynx w/o Erythema/Exudate Eyes: Sclera non-icteric, conjunctiva clear Neck: Trachea midline.  No JVD.  Pulmonary:  Good air movement, respirations not labored, equal bilaterally.  Cardiac: RRR, normal S1, S2. Vascular: Palpable positive ulnar and radial pulses upon exam of right hand.  Right index finger noted to have eschar related to ischemia without infection noted.  Close inspection shows patient had the same eschar to his ring finger and pinky finger on the right hand that is well-healed.  Hand is warm to the touch. Vessel Right Left  Radial Palpable Palpable  Ulnar Palpable Palpable  Brachial Palpable Palpable  Carotid Palpable, without bruit Palpable, without bruit  Aorta Not palpable N/A  Femoral Palpable Palpable  Popliteal Palpable Palpable  PT Palpable Palpable  DP Palpable Palpable   Gastrointestinal: soft, non-tender/non-distended. No guarding/reflex.  Musculoskeletal: M/S 5/5 throughout.  Extremities without ischemic changes.  No deformity or atrophy. No edema. Neurologic: Sensation grossly intact in extremities.  Symmetrical.  Speech is fluent. Motor exam as listed above. Psychiatric: Judgment intact, Mood & affect appropriate for pt's clinical situation. Dermatologic: No rashes or ulcers noted.  No cellulitis or open wounds. Lymph : No Cervical, Axillary, or Inguinal lymphadenopathy.    CBC Lab Results  Component Value Date   WBC 8.3 07/15/2022   HGB 8.4 (L) 07/15/2022   HCT 25.3 (L) 07/15/2022   MCV 85.5 07/15/2022   PLT 174 07/15/2022    BMET    Component Value Date/Time   NA 143 07/15/2022 0430   NA 148 (H) 08/03/2021 1043   K 3.6 07/15/2022 0430   CL 113 (H) 07/15/2022 0430   CO2 24 07/15/2022 0430   GLUCOSE 93 07/15/2022 0430   BUN 12 07/15/2022 0430   BUN 11 08/03/2021 1043   CREATININE 1.04 07/15/2022 0430   CALCIUM 10.0 07/15/2022 0430   GFRNONAA >60  07/15/2022 0430   GFRAA >60 11/05/2019 0430   Estimated Creatinine Clearance: 47.6 mL/min (by C-G formula based on SCr of 1.04 mg/dL).  COAG No results found for: "INR", "PROTIME"  Radiology CT ABDOMEN PELVIS W CONTRAST  Result Date: 07/13/2022 CLINICAL DATA:  Acute nonlocalized abdominal pain; drain in place; follow-up imaging for drain management EXAM: CT ABDOMEN AND PELVIS WITH CONTRAST TECHNIQUE: Multidetector CT imaging of the abdomen and pelvis was performed using the standard protocol following bolus administration of intravenous contrast. RADIATION DOSE REDUCTION: This exam was performed according to the departmental dose-optimization program which includes automated exposure control, adjustment of the mA and/or kV according to patient size and/or use of iterative reconstruction technique. CONTRAST:  56mL OMNIPAQUE IOHEXOL 350 MG/ML SOLN COMPARISON:  CT 07/07/2022 FINDINGS: Lower chest: Mild cardiomegaly. Trace pericardial effusion. Small left pleural effusion unchanged. Left basilar atelectasis/scarring. Hepatobiliary: Hepatic cysts. No follow-up imaging recommended. No evidence of cholecystitis. No biliary dilation. Pancreas: 2 cm homogeneous rounded cystic lesion in the body/tail of the pancreas which is unchanged and could represent a pseudocyst, serous or mucinous cystadenoma or side branch IPMN. MRI without and with contrast is recommended when clinically feasible. No pancreatic ductal dilation. Spleen: Normal in size without focal abnormality. Adrenals/Urinary Tract: Unremarkable adrenal glands. Low-attenuation lesions in the kidneys are statistically likely to represent cysts. No follow-up is required. Punctate nonobstructing left nephrolithiasis. No hydronephrosis. The bladder is mildly distended and filled with gas and fluid some of which is layering posteriorly and may be hyperdense though evaluation is limited by streak artifact. Foley catheter in the bladder. Stomach/Bowel:  Postoperative change of near total colectomy. Normal caliber small bowel filled with enteric contrast. Questionable rectal wall thickening and adjacent stranding/fluid though this area is  poorly evaluated due to streak artifact. Right lower quadrant ileostomy. Vascular/Lymphatic: Aortic atherosclerosis. No enlarged abdominal or pelvic lymph nodes. Reproductive: Mild enlargement of the prostate. Other: Right abdominal percutaneous drain within a intermediate density fluid collection along the anterior right abdominal wall tracking inferiorly to the superior aspect of the bladder and superiorly to the undersurface of the right hepatic lobe. This measures approximately 3.6 x 2.0 x 11.4 cm (series 2/image 39 and series 5/image 29). This is slightly decreased in size compared to 07/07/2022 when it measured approximately 4.2 x 2.4 x 15 cm. The gaseous component of the fluid collection has resolved. Mesenteric edema. Small amount of free fluid in the pelvis. Body wall anasarca. Musculoskeletal: Left TKA. No acute osseous abnormality. Advanced thoracolumbar spondylosis. Grade 1 anterolisthesis L4. IMPRESSION: 1. The percutaneous right abdominal wall pigtail catheter remains in the fluid collection in the right hemiabdomen which has slightly decreased in size. 2. The bladder is catheterized but remains distended with air and gas. Questionable layering hyperdense fluid within the posterior bladder though this is obscured by streak artifact. Recommend clinical correlation for catheter dysfunction and possible blood clot within the bladder. 3. Thickened bladder wall.  Cystitis not excluded. 4. 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 5. The rectum may be mildly thickened with adjacent stranding/fluid. Evaluation is limited by streak artifact. Findings may be due to congestion or proctitis. 6. 2 cm cystic lesion at the junction of the body and tail of the pancreas. MRI without and with contrast is  recommended when clinically feasible. Benign and malignant etiologies are possible. 7. Mesenteric edema and small amount of free fluid in the pelvis. Marked body wall anasarca. Findings may be related to fluid overload. 8. Improved left lower lobe pneumonia. Residual small left pleural effusion. 9. Coronary artery and Aortic Atherosclerosis (ICD10-I70.0). Electronically Signed   By: Placido Sou M.D.   On: 07/13/2022 22:02   EEG adult now  Result Date: 07/10/2022 Derek Jack, MD     07/10/2022  8:27 AM Routine EEG Report Cloyd Clive Dishaw is a 76 y.o. male with a history of staring spells concerning for seizure who is undergoing an EEG to evaluate for seizures. Report: This EEG was acquired with electrodes placed according to the International 10-20 electrode system (including Fp1, Fp2, F3, F4, C3, C4, P3, P4, O1, O2, T3, T4, T5, T6, A1, A2, Fz, Cz, Pz). The following electrodes were missing or displaced: none. The occipital dominant rhythm was 5-6 Hz. This activity is reactive to stimulation. Drowsiness was manifested by background fragmentation; deeper stages of sleep were not identified. There was bifrontal slowing and intermittent periods lasting 1-2 seconds of diffuse delta slowing. There were no interictal epileptiform discharges. There were no electrographic seizures identified. There was no abnormal response to photic stimulation or hyperventilation. Impression and clinical correlation: This EEG was obtained while awake and drowsy and is abnormal due to: - moderate to severe diffuse slowing indicative of global cerebral dysfunction - bifrontal focal slowing indicative of focal cerebral dysfunction in these regions Epileptiform abnormalities were not seen during this recording. Su Monks, MD [facility] [phone] If 7pm- 7am, please page neurology on call as listed in [place].   US Carotid Bilateral (at [facility] and [facility] only)  Result Date: 07/08/2022 CLINICAL DATA:  Dementia  EXAM: BILATERAL CAROTID DUPLEX ULTRASOUND TECHNIQUE: Pearline Cables scale imaging, color Doppler and duplex ultrasound were performed of bilateral carotid and vertebral arteries in the neck. COMPARISON:  None Available. FINDINGS: Criteria: Quantification  of carotid stenosis is based on velocity parameters that correlate the residual internal carotid diameter with NASCET-based stenosis levels, using the diameter of the distal internal carotid lumen as the denominator for stenosis measurement. The following velocity measurements were obtained: RIGHT ICA: 124 cm/sec CCA: 80 cm/sec SYSTOLIC ICA/CCA RATIO:  1.5 ECA: 93 cm/sec LEFT ICA: 91 cm/sec CCA: 83 cm/sec SYSTOLIC ICA/CCA RATIO:  1.1 ECA: 87 cm/sec RIGHT CAROTID ARTERY: Patent without plaque or intimal thickening RIGHT VERTEBRAL ARTERY:  Patent with antegrade flow LEFT CAROTID ARTERY:  Patent without plaque or intimal thickening LEFT VERTEBRAL ARTERY:  Patent with antegrade flow IMPRESSION: 1. Negative study. No carotid stenosis identified in the neck by Doppler criteria. 2. The vertebral arteries are patent with antegrade flow. Electronically Signed   By: Darrin Nipper M.D.   On: 07/08/2022 09:17   CT CHEST ABDOMEN PELVIS W CONTRAST  Result Date: 07/07/2022 CLINICAL DATA:  Recent surgery for perforated bowel at some point last month, presents with acute abdominal pain with leukocytosis. EXAM: CT CHEST, ABDOMEN, AND PELVIS WITH CONTRAST TECHNIQUE: Multidetector CT imaging of the chest, abdomen and pelvis was performed following the standard protocol during bolus administration of intravenous contrast. RADIATION DOSE REDUCTION: This exam was performed according to the departmental dose-optimization program which includes automated exposure control, adjustment of the mA and/or kV according to patient size and/or use of iterative reconstruction technique. CONTRAST:  79mL OMNIPAQUE IOHEXOL 300 MG/ML  SOLN COMPARISON:  Portable chest today, chest radiograph 07/06/2019. No prior  chest CT. Comparison CT abdomen and pelvis without contrast is available dated 07/06/2019. FINDINGS: CT CHEST FINDINGS Cardiovascular: The cardiac size is normal. There is no pericardial effusion. There are scattered calcific plaques in the LAD coronary artery. There are scattered calcifications of the aortic valve leaflets, mild calcific plaques in the aortic arch and subclavian arteries. There is no aortic or great vessel aneurysm, dissection or stenosis. The pulmonary arteries and veins are normal caliber. The pulmonary arteries are centrally clear but not evaluated distal to the hila. Mediastinum/Nodes: No enlarged mediastinal, hilar, or axillary lymph nodes. Thyroid gland, trachea, and esophagus demonstrate no significant findings. Lungs/Pleura: Eventration and asymmetric elevation noted left hemidiaphragm. There is patchy airspace disease in the left lower lobe consistent with pneumonia or aspiration with small layering parapneumonic pleural effusion without evidence of empyema. There is no right pleural effusion. No pneumothorax or pleural thickening. Respiratory motion is noted on exam. Central airways and remaining lungs are clear, as visualized. Musculoskeletal: Moderate bilateral shoulder DJD. Mild dextroscoliosis of advanced marginal osteophytosis thoracic spine. 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 No acute or other significant osseous findings. No chest wall mass is seen. CT ABDOMEN PELVIS FINDINGS Technical note: Limited image resolution in the abdomen and pelvis due to patient motion, breathing motion, and streak artifact from the patient's arms in the field and overlying wires. Hepatobiliary: No obvious abnormality. There are tiny stones in the gallbladder proximally but no wall thickening. The bile ducts are normal caliber. Pancreas: No solid mass enhancement. There is a 2 cm homogeneous  rounded cystic lesion at the junction of the body and tail of the pancreas, posterior aspect, Hounsfield density of 7 units (see series 2, axial images 54-56). This could be a pseudocyst, serous or mucinous cystadenoma or side branch IPMN. Other etiologies such as malignancy are possible especially given the motion and streak artifacts through the area. MRI without and with contrast recommended  when clinically feasible. There is no pancreatic ductal dilatation or other focal abnormality. Spleen: Not well seen but no obvious abnormality.  No splenomegaly. Adrenals/Urinary Tract: There is no adrenal mass. There is a 1 cm homogeneous cyst in the posterior aspect of the left kidney, Hounsfield density of 7 units. There are a few additional bilateral scattered cortical hypodensities which are too small to characterize. No follow-up imaging is recommended. For reference see JACR 2018 Feb; 264-273, Management of the Incidental Renal Mass on CT, [employer] 2021; 814-848, Bosniak Classification of Cystic Renal Masses, Version 2019. There is bilateral mild renal cortical thinning. There are punctate nonobstructive caliceal stones posteriorly in left kidney. No nephrolithiasis is seen on the right. No hydronephrosis or ureteral stones. The bladder is catheterized but despite this is still distended with air and fluid. Clinical correlation recommended for catheter dysfunction. There is mild bladder thickening versus underdistention. Stomach/Bowel: The patient appears to have undergone a near total colectomy. The rectum remains and is sutured closed in the presacral space, with mild rectal wall thickening and perirectal stranding, the latter which could be congestive or related to fluid overload or proctitis. 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 A pigtail catheter is noted entering the  lateral right mid abdominal wall with the pigtail within a right mid abdominal small bowel segment. There are a few slightly dilated left mid abdominal small bowel segments, maximum caliber 2.9 cm, unknown if this is due to partial small bowel obstruction or ileus but there is no bowel dilatation elsewhere in the abdomen or pelvis. A transitional segment could not be found. Vascular/Lymphatic: There is diffuse mesenteric haziness. Unclear whether related to fluid overload, congestive, malnutrition or hepatic dysfunction. The abdominal aorta is unremarkable. There are patchy calcific plaques in the common iliac and internal iliac arteries. The portal vein is normal caliber and patent. Accounting for patient motion no enlarged lymph nodes are seen throughout. Reproductive: The prostate is not well seen due to streak artifact from left hip replacement, but transversely it measures enlarged at 5.4 cm. Seminal vesicles are normal in size. Both testicles are in the scrotal sac and there are small scrotal hydroceles. Other: Minimal abdominal and pelvic ascites. There is no free air, abscess or free hemorrhage. Presence of diffuse mesenteric edema limits assessment for focal inflammatory change. There has been interval weight loss since 07/06/2019 with reduction in the intra-abdominal and subcutaneous body fat stores. There is mild body wall anasarca. Musculoskeletal: Left THA. Moderately advanced right hip DJD. There is dextroscoliosis and advanced degenerative change of the lumbar spine. Advanced facet hypertrophy L3-4 down is noted with degenerative grade 1 anterolisthesis at L4-5 and L5-S1 and severe acquired foraminal and spinal canal stenosis at both levels. 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 There is no destructive bone lesion. IMPRESSION: 1. Left lower lobe pneumonia or aspiration with small layering parapneumonic effusion. 2. Aortic and coronary artery atherosclerosis. No thoracic or abdominal aortic  aneurysm. 3. Aortic valve leaflet calcifications. Echocardiography may be helpful to assess for aortic stenosis. 4. Diffuse mesenteric haziness/edema. Unclear whether this is related to fluid overload, congestive, malnutrition or hepatic dysfunction. Only minimal ascites. No free air. 5. Cholelithiasis without evidence of cholecystitis. 6. Nonobstructive micronephrolithiasis. 7. 2 cm cystic lesion at the junction of the body and tail of the pancreas. MRI without and with contrast recommended when clinically feasible. Benign and  malignant etiologies are possible. 8. 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 No parastomal hernia. 9. Slightly dilated left mid abdominal small bowel segments, unknown if this is due to partial SBO or ileus. 10. The rectum remains, and there is rectal thickening and surrounding stranding which could be congestive or due to proctitis. 11. Thickened stomach and scattered mid to lower abdominal small bowel segments which could be due to gastroenteritis or congestive. 12. Bladder catheterized but despite this is still distended with air and fluid. Clinical correlation recommended for catheter dysfunction. Cystitis versus bladder nondistention. 13. Prostatomegaly. 14. 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 15. Remaining findings described above. Electronically Signed   By: Telford Nab M.D.   On: 07/07/2022 20:41   CT Head Wo Contrast  Result Date: 07/07/2022 CLINICAL DATA:  Mental status change. EXAM: CT HEAD WITHOUT CONTRAST TECHNIQUE: Contiguous axial images were obtained from the base of the skull through the vertex without intravenous contrast. RADIATION DOSE REDUCTION: This exam was performed according to the departmental dose-optimization program which includes automated exposure control, adjustment of the mA and/or kV according to patient size and/or use of  iterative reconstruction technique. COMPARISON:  Jan 11, 2022 CT scan the brain FINDINGS: Brain: Ventricles and sulci are prominent but stable. No mass effect or midline shift. No cyst subdural, epidural, or subarachnoid hemorrhage. Cerebellum, brainstem, and basal cisterns are normal. Scattered white matter changes are identified. No acute cortical ischemia or infarct. Vascular: No hyperdense vessel or unexpected calcification. Skull: Normal. Negative for fracture or focal lesion. Sinuses/Orbits: No acute finding. Other: None. IMPRESSION: Chronic white matter changes. No acute intracranial abnormalities. Electronically Signed   By: Dorise Bullion III M.D.   On: 07/07/2022 17:18   DG Chest Portable 1 View  Result Date: 07/07/2022 CLINICAL DATA:  Shortness of breath and tachycardia. EXAM: PORTABLE CHEST 1 VIEW COMPARISON:  07/06/2019 FINDINGS: The cardiomediastinal silhouette is unremarkable. Elevation of the LEFT hemidiaphragm with mild LEFT basilar atelectasis/scarring again noted. There is no evidence of focal airspace disease, pulmonary edema, suspicious pulmonary nodule/mass, pleural effusion, or pneumothorax. No acute bony abnormalities are identified. IMPRESSION: No active disease. Electronically Signed   By: Margarette Canada M.D.   On: 07/07/2022 16:37      Assessment/Plan 1.  Patient appears to have had septic emboli to his fingers of his right hand due to low flow situation most likely related to his severe sepsis.  At this time his right pinky finger and ring finger are well-healed.  His right index finger still has some eschar from his septic emboli.  At this time there does not appear to be an infection in his index finger.  It appears to be healing but slowly.  He has palpable pulses to his radial and ulnar artery with his right hand remaining very warm to touch.  At this time there is no intervention needed but if his index finger or other fingers of his right hand worsen we would suggest an  upper extremity ultrasound to rule out any thrombosis either in his right arm and hand.  2.  Hypertension:  Continue antihypertensive medications as already ordered, these medications have been reviewed and there are no changes at this time.  3. Left Lower Lobe Pneumonia: Primary team to continue to monitor breathing status/hypoxia with confusion.   Plan of care discussed with Dr. Leotis Pain and he is in agreement with plan  noted above.   Family Communication:  Total Time: 75 I spent 75 minutes in this encounter including personally reviewing extensive medical records, personally reviewing imaging studies and compared to prior scans, counseling the patient, placing orders, coordinating care and performing appropriate documentation  Thank you for allowing [place] to participate in the care of this patient.   Drema Pry, NP [facility] [phone] (Office Phone) [phone] (Office Fax) [phone] (Pager)  07/16/2022 2:20 PM  Staff may message me via secure chat in [place]  but this may not receive immediate response,  please page for urgent matters!  Dictation software was used to generate the above note. Typos may occur and escape review, as with typed/written notes. Any error is purely unintentional.  Please contact me directly for clarity if needed.

## 2022-07-16 NOTE — Progress Notes (Addendum)
PROGRESS NOTE  Carl Figueroa  [id number] DOB: 01/26/1946 DOA: 07/07/2022 PCP: Carl Ramsay, MD   Brief Narrative: Patient is a 76 year old with history of hypertension, dementia  CKD 3a, who lives in a nursing facility , recently discharged on 11/17 from a 1 month admission at the [place] for appendicitis and intra-abdominal abscesses where he underwent laparotomy with  colectomy and ileostomy placement and discharged with Foley catheter who was brought to the ED for staring episode x2 since arrival home, once on the day of discharge on 11/17 and another on 11/18.  He was unresponsive to his name during the episode. Marland Kitchen  On presentation, lab work showed leukocytosis, lactic acidosis, AKI.  CT head did not show any acute findings.  CT chest/abdomen/pelvis showed left lower lobe pneumonia, possible aspiration, diffuse mesenteric haziness.  Patient was started on broad-spectrum antibiotics for the suspicion of severe sepsis secondary to left lower lobe pneumonia.  Hospital course also remarkable for confusion, agitation. PT/OT recommending SNF but has been delayed due to holidays and also because he is on mittens for confusion. Working on removing the mittens /improving mentation before discharge.  Assessment & Plan:  Principal Problem:   Severe sepsis ([place]) Active Problems:   Left lower lobe pneumonia   Foley catheter in place on admission   Acute metabolic encephalopathy   Staring episodes   Acute renal failure superimposed on stage 3a chronic kidney disease ([place])   S/P laparotomy   Long term (current) use of antibiotics   Hypotension   Colostomy present (HCC)   Hyperkalemia   Essential hypertension   Dementia without behavioral disturbance (HCC)  Severe sepsis: Presented with hypotension, tachycardia, leukocytosis, lactic acidosis, AKI, encephalopathy.  Likely source was left lower lobe pneumonia.  Sepsis physiology has significantly improved.  Cultures have been  negative. Initially started on IV antibiotics, changed to Augmentin,now completed Abx course. Leukocytosis has resolved.  Left lower lobe pneumonia: As seen on the CT.  Respiratory status has improved and he is room air.    Encephalopathy/staring episodes/history of dementia: CT head negative.  EEG showed moderate to severe diffuse slowing indicative of global cerebral dysfunction, no epileptiform abnormality seen. He has history of dementia/short-term memory problem.He Needs assistance with feeding, dressing at baseline. On Seroquel, trazodone at baseline Carl Figueroa mental status had improved, and he looked calm when I saw him 11/23.  Today he appears confused, intermittently agitated, disoriented.  Mittens in place. Most likely this is hospital-acquired delirium.  Continue delirium precautions.  We will initiate low-dose Seroquel.Trazodone stopped  Status post ileostomy: He underwent laparotomy with near-total colectomy on 05/30/2022 for appendicitis with intra-abdominal abscess, bowel perforation. Carl Figueroa has right lower  quadrant drain He follows with the [place]. General surgery was also consulted here for concern of infection from intra-abdominal source which has been ruled out.He is making solid stool in the colostomy bag.Has a right lower quadrant drain .  Follow-up CT showed decreased fluid collection in the right hemiabdomen.He  was supposed to follow up with Surgery at [facility] for drain removal. Since he has high risk of taking the drain out  by himself due to his delirium, we have requested general surgery for follow-up today  Chronic urinary retention: [facility]  Continue on dc  Follow-up with urology as outpatient.  On Proscar, Flomax.He has an appointment with [facility] in [place] this week  Hypertension: Takes lisinopril at home which is on hold due to finding of AKI on  presentation.  He is normotensive without any medications  AKI:  Resolved  Normocytic anemia: Likely chronic, hemoglobin stable in the range of 8  Hyperkalemia/hypernatremia: Resolved.    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 Needs goals of care discussion .Palliative care consulted     Pressure Injury 07/08/22 Coccyx Bilateral Stage 2 -  Partial thickness loss of dermis presenting as a shallow open injury with a red, pink wound bed without slough. (Active)  07/08/22 1534  Location: Coccyx  Location Orientation: Bilateral  Staging: Stage 2 -  Partial thickness loss of dermis presenting as a shallow open injury with a red, pink wound bed without slough.  Wound Description (Comments):   Present on Admission: Yes  Dressing Type Foam - Lift dressing to assess site every shift 07/15/22 2200    DVT prophylaxis:enoxaparin (LOVENOX) injection 40 mg Start: 07/07/22 2230     Code Status: Full Code  Family Communication: Wife on phone on 11/27  Patient status:Inpatient  Patient is from :SNF  Anticipated discharge to:SNF  Estimated DC date:after being out of mittens for 24 hrs   Consultants:General Surgery  Procedures:EEG  Antimicrobials:  Anti-infectives (From admission, onward)    Start     Dose/Rate Route Frequency Ordered Stop   07/10/22 2200  amoxicillin-clavulanate (AUGMENTIN) 875-125 MG per tablet 1 tablet        1 tablet Oral Every 12 hours 07/10/22 1610 07/13/22 2030   07/09/22 2100  vancomycin (VANCOCIN) IVPB 1000 mg/200 mL premix        1,000 mg 200 mL/hr over 60 Minutes Intravenous Every 24 hours 07/09/22 0727 07/10/22 2200   07/08/22 2100  vancomycin (VANCOREADY) IVPB 750 mg/150 mL  Status:  Discontinued        750 mg 150 mL/hr over 60 Minutes  Intravenous Every 24 hours 07/07/22 2248 07/09/22 0727   07/08/22 2000  Ampicillin-Sulbactam (UNASYN) 3 g in sodium chloride 0.9 % 100 mL IVPB  Status:  Discontinued        3 g 200 mL/hr over 30 Minutes Intravenous Every 6 hours 07/08/22 1422 07/10/22 1610   07/08/22 0600  Ampicillin-Sulbactam (UNASYN) 3 g in sodium chloride 0.9 % 100 mL IVPB  Status:  Discontinued        3 g 200 mL/hr over 30 Minutes Intravenous Every 8 hours 07/07/22 2240 07/08/22 1422   07/07/22 1845  vancomycin (VANCOREADY) IVPB 1250 mg/250 mL        1,250 mg 166.7 mL/hr over 90 Minutes Intravenous  Once 07/07/22 1833 07/07/22 2237   07/07/22 1830  ceFEPIme (MAXIPIME) 2 g in sodium chloride 0.9 % 100 mL IVPB        2 g 200 mL/hr over 30 Minutes Intravenous  Once 07/07/22 1827 07/07/22 2033   07/07/22 1830  metroNIDAZOLE (FLAGYL) IVPB 500 mg        500 mg 100 mL/hr over 60 Minutes Intravenous  Once 07/07/22 1827 07/07/22 2057   07/07/22 1830  vancomycin (VANCOCIN) IVPB 1000 mg/200 mL premix  Status:  Discontinued        1,000 mg 200 mL/hr over 60 Minutes Intravenous  Once 07/07/22 1827 07/07/22 1833       Subjective:  Patient seen and examined at the bedside today.  He was lying on bed.  On room air.  Not in apparent distress.  Resting when I arrived.  When I talked to him, he became slightly agitated, did not answer any of my questions.  On mittens.  Appears more confused today  Objective: Vitals:   07/15/22 1620 07/15/22 1940 07/15/22 2349 07/16/22 0800  BP: (!) 113/59 119/64 (!) 122/57 133/66  Pulse: 85 90 87 81  Resp: 16 18 20 18   Temp: 98.3 F (36.8 C) 98.2 F (36.8 C) 97.6 F (36.4 C) 97.7 F (36.5 C)  TempSrc:  Oral    SpO2: 100% 100%  100%  Weight:      Height:        Intake/Output Summary (Last 24 hours) at 07/16/2022 1050 Last data filed at 07/16/2022 1014 Gross per 24 hour  Intake 1593.2 ml  Output 1625 ml  Net -31.8 ml   Filed Weights   07/07/22 1626 07/09/22 2209  Weight: 68 kg  55.7 kg    Examination:   General exam: Confused, lying in bed, appears very deconditioned HEENT: PERRL Respiratory system:  no wheezes or crackles  Cardiovascular system: S1 & S2 heard, RRR.  Gastrointestinal system: Abdomen is nondistended, soft and nontender.  Ileostomy, right lower quadrant drain Central nervous system: Awake but  not oriented Extremities: No edema, no clubbing ,no cyanosis Skin: pressure ulcer as above GU: Foley     Data Reviewed: I have personally reviewed following labs and imaging studies  CBC: Recent Labs  Lab 07/10/22 0556 07/15/22 0430  WBC 9.0 8.3  HGB 8.1* 8.4*  HCT 24.7* 25.3*  MCV 86.1 85.5  PLT 242 [id number]   Basic Metabolic Panel: Recent Labs  Lab 07/10/22 0556 07/12/22 0532 07/14/22 0456 07/15/22 0430  NA 147* 141  --  143  K 4.0 3.9  --  3.6  CL 115* 113*  --  113*  CO2 27 24  --  24  GLUCOSE 98 87  --  93  BUN 16 12  --  12  CREATININE 1.14 1.04 0.97 1.04  CALCIUM 10.0 9.8  --  10.0     Recent Results (from the past 240 hour(s))  Gastrointestinal Panel by PCR , Stool     Status: None   Collection Time: 07/07/22  4:33 PM   Specimen: Stool  Result Value Ref Range Status   Campylobacter species NOT DETECTED NOT DETECTED Final   Plesimonas shigelloides NOT DETECTED NOT DETECTED Final   Salmonella species NOT DETECTED NOT DETECTED Final   Yersinia enterocolitica NOT DETECTED NOT DETECTED Final   Vibrio species NOT DETECTED NOT DETECTED Final   Vibrio cholerae NOT DETECTED NOT DETECTED Final   Enteroaggregative E coli (EAEC) NOT DETECTED NOT DETECTED Final   Enteropathogenic E coli (EPEC) NOT DETECTED NOT DETECTED Final   Enterotoxigenic E coli (ETEC) NOT DETECTED NOT DETECTED Final   Shiga like toxin producing E coli (STEC) NOT DETECTED NOT DETECTED Final   Shigella/Enteroinvasive E coli (EIEC) NOT DETECTED NOT DETECTED Final   Cryptosporidium NOT DETECTED NOT DETECTED Final   Cyclospora cayetanensis NOT DETECTED NOT DETECTED  Final   Entamoeba histolytica NOT DETECTED NOT DETECTED Final   Giardia lamblia NOT DETECTED NOT DETECTED Final   Adenovirus F40/41 NOT DETECTED NOT DETECTED Final   Astrovirus NOT DETECTED NOT DETECTED Final   Norovirus GI/GII NOT DETECTED NOT DETECTED Final   Rotavirus A NOT DETECTED NOT DETECTED Final   Sapovirus (I, II, IV, and V) NOT DETECTED NOT DETECTED Final    Comment: Performed at  [facility], [address], [place], [place] [address]  C Difficile Quick Screen w PCR reflex     Status: None   Collection Time: 07/07/22  4:33 PM   Specimen: Stool  Result Value Ref Range Status   C Diff antigen NEGATIVE NEGATIVE Final   C Diff toxin NEGATIVE NEGATIVE Final   C Diff interpretation No C. difficile detected.  Final    Comment: Performed at [facility], [address] [place], [place] [address]  Culture, blood (single)     Status: None   Collection Time: 07/07/22  6:34 PM   Specimen: BLOOD RIGHT ARM  Result Value Ref Range Status   Specimen Description BLOOD RIGHT ARM  Final   Special Requests   Final    BOTTLES DRAWN AEROBIC AND ANAEROBIC Blood Culture results may not be optimal due to an inadequate volume of blood received in culture bottles   Culture   Final    NO GROWTH 5 DAYS Performed at [facility], [address] [place], [place] [address]    Report Status 07/12/2022 FINAL  Final  MRSA Next Gen by PCR, Nasal     Status: None   Collection Time: 07/09/22  3:49 PM   Specimen: Nasal Mucosa; Nasal Swab  Result Value Ref Range Status   MRSA by PCR Next Gen NOT DETECTED NOT DETECTED Final    Comment: (NOTE) The GeneXpert MRSA Assay (FDA approved for NASAL specimens only), is one component of a comprehensive MRSA colonization surveillance program. It is not intended to diagnose MRSA infection nor to guide or monitor treatment for MRSA infections. Test performance is not FDA approved in patients less than 75 years old. Performed at [facility], [address] [place], [place] [address]      Radiology Studies: No results found.  Scheduled Meds:   stroke: early stages of recovery book   Does not apply Once   Chlorhexidine Gluconate Cloth  6 each Topical Daily   enoxaparin (LOVENOX) injection  40 mg Subcutaneous Q24H   finasteride  5 mg Oral Daily   mouth rinse  15 mL Mouth Rinse Q2H   sertraline  25 mg Oral Daily   tamsulosin  0.4 mg Oral Daily   traZODone  50 mg Oral BID   Continuous Infusions:  sodium chloride Stopped (07/08/22 2225)   sodium chloride 100 mL/hr at 07/16/22 0544     LOS: 9 days   Burnadette Pop, MD [facility] P11/27/2023, 10:50 AM

## 2022-07-16 NOTE — TOC Progression Note (Addendum)
Transition of Care [facility]) - Progression Note    Patient Details  Name: Carl Figueroa MRN: [id number] Date of Birth: March 26, 1946  [facility]) CM/SW Contact  Truddie Hidden, RN Phone Number: 07/16/2022, 10:05 AM  Clinical Narrative:    Per patient's primary nurse patient remains in mittens.   11:34AM Per Marcelino Duster in admissions at facility Patient would have to be restraint free for 48 hours. MD notified.    Expected Discharge Plan: Skilled Nursing Facility    Expected Discharge Plan and Services Expected Discharge Plan: Skilled Nursing Facility In-house Referral: Clinical Social Work   Post Acute Care Choice: Skilled Nursing Facility Living arrangements for the past 2 months: Single Family Home                                       Social Determinants of Health (SDOH) Interventions    Readmission Risk Interventions     No data to display

## 2022-07-17 DIAGNOSIS — R6520 Severe sepsis without septic shock: Secondary | ICD-10-CM | POA: Diagnosis not present

## 2022-07-17 DIAGNOSIS — R4182 Altered mental status, unspecified: Secondary | ICD-10-CM | POA: Diagnosis not present

## 2022-07-17 DIAGNOSIS — N179 Acute kidney failure, unspecified: Secondary | ICD-10-CM | POA: Diagnosis not present

## 2022-07-17 DIAGNOSIS — A419 Sepsis, unspecified organism: Secondary | ICD-10-CM | POA: Diagnosis not present

## 2022-07-17 DIAGNOSIS — Z789 Other specified health status: Secondary | ICD-10-CM

## 2022-07-17 DIAGNOSIS — J189 Pneumonia, unspecified organism: Secondary | ICD-10-CM | POA: Diagnosis not present

## 2022-07-17 MED ORDER — QUETIAPINE FUMARATE 25 MG PO TABS: 25.0000 mg | ORAL_TABLET | Freq: Every day | ORAL | 1 refills | Status: DC

## 2022-07-17 NOTE — Plan of Care (Addendum)
Mittens removed at 11 am today. Skin has been intact. No erythema noted under my cares for this patient. Removed mittens every 2 hours for skin assessment. No issues noted. Frequents checks and reminders given to keep foley and colostomy bag in place.

## 2022-07-17 NOTE — Consult Note (Signed)
Consultation Note Date: 07/17/2022 at 1400  Patient Name: Carl Figueroa  DOB: 1946-03-31  MRN: [id number]  Age / Sex: 76 y.o., male  PCP: Leonel Ramsay, MD Referring Physician: Shelly Coss, MD  Reason for Consultation: Establishing goals of care  HPI/Patient Profile: 76 y.o. male  with past medical history of HTN, CKD ( stage 3) and dementia admitted from a nursing facility on 07/07/2022 with sepsis and LLL PNA. He was treated with board spectrum antibiotics. Hospitalization was complicated by confusion and agitation requiring use of mittens. Patient's wife has requested patient return home with HH/her.  Today marks day 10 of hospitalization. PMT was consulted to discuss [place] and code status.     Clinical Assessment and Goals of Care: I have reviewed medical records including EPIC notes, labs and imaging, assessed the patient and then met with patient's wife Carl Figueroa to discuss diagnosis prognosis, GOC, EOL wishes, disposition and options. Given patient's advanced dementia, patient's wife is Media planner.   I introduced Palliative Medicine as specialized medical care for people living with serious illness. It focuses on providing relief from the symptoms and stress of a serious illness. The goal is to improve quality of life for both the patient and the family.  We discussed a brief life review of the patient. 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 His first wife passed in 2017 and he remarried Carl Figueroa 5 years ago.   As far as functional and nutritional status Carl Figueroa has noticed a change over the past year. He has not been able to turn a key in a hole, twist a screwdriver, put his shoes on, or button his shirt. She shares he was physically doing well but had memory issues until he fell ill a few weeks ago.   We discussed patient's current illness and what it means in the larger context of  patient's on-going co-morbidities. Education provided on dementia as a chronic, progressive, irreversible disease.  I attempted to elicit values and goals of care important to the patient.  Carl Figueroa states that she wants to take the patient home where he will not be in mittens, have therapy come to him, and she can watch and see how/if he improves over the next 6 weeks.   Advance directives, concepts specific to code status, artificial feeding and hydration, and rehospitalization were considered and discussed. Carl Figueroa shares that in the event of a cardiopulmonary arrest then she would not want resuscitative measures or use of ventilator. However, she does not want to change his code status. She wants him to remain full code and is accepting of all offered, available, and appropriate interventions to sustain his life until she feels he has "improved" as much as he is going to. We discussed patient's new baseline may be at a lower level of functional and cognitive abilities. She shares she has seen small signs of him "looking like himself" and wants to watch and wait to see if this continues.  Full code and full scope remain.  Education offered regarding concept specific to human mortality.   Discussed with Carl Figueroa the importance of continued conversation with the medical providers regarding overall plan of care and treatment options, ensuring decisions are within the context of the patient's values and GOCs.    Hospice and Palliative Care services outpatient were explained. Carl Figueroa shares she has considered hospice but that she is not ready for that yet. She is accepting of outpatient palliative services at discharge. Referral to [facility] placed.   Questions and concerns were addressed. Carl Figueroa was encouraged to call with questions or concerns. PMT will remain available to patient/Sue throughout his hospitalization and shadow his chart. Discharge summary is in place and plan is for patient to d/c today.   Please re-engage  with PMT if goals change, at patient/family's request, or if patient's health deteriorates during hospitalization.   Primary Decision Maker NEXT OF KIN  Physical Exam Vitals reviewed.  Constitutional:      General: He is not in acute distress.    Appearance: Normal appearance. He is not ill-appearing.  HENT:     Head: Normocephalic.     Mouth/Throat:     Mouth: Mucous membranes are moist.  Eyes:     Pupils: Pupils are equal, round, and reactive to light.  Cardiovascular:     Rate and Rhythm: Normal rate.     Pulses: Normal pulses.  Pulmonary:     Effort: Pulmonary effort is normal.  Abdominal:     Palpations: Abdomen is soft.  Musculoskeletal:        General: Normal range of motion.     Comments: MAETC, generalized weakness  Skin:    General: Skin is warm and dry.  Neurological:     Mental Status: He is alert.     Comments: Oriented to self     Palliative Assessment/Data: 50%     Thank you for this consult. Palliative medicine will continue to follow and assist holistically.   Time Total: 75 minutes Greater than 50%  of this time was spent counseling and coordinating care related to the above assessment and plan.  Signed by: Jordan Hawks, DNP, FNP-BC Palliative Medicine    Please contact Palliative Medicine Team phone at [phone] for questions and concerns.  For individual provider: See Shea Evans

## 2022-07-17 NOTE — Progress Notes (Signed)
PROGRESS NOTE  Carl Figueroa  XTG:[id number] DOB: 09/05/1945 DOA: 07/07/2022 PCP: Mick Sell, MD   Brief Narrative: Patient is a 76 year old with history of hypertension, dementia  CKD 3a, who lives in a nursing facility , recently discharged on 11/17 from a 1 month admission at the [place] for appendicitis and intra-abdominal abscesses where he underwent laparotomy with  colectomy and ileostomy placement and discharged with Foley catheter who was brought to the ED for staring episode x2 since arrival home, once on the day of discharge on 11/17 and another on 11/18.  He was unresponsive to his name during the episode. Marland Kitchen  On presentation, lab work showed leukocytosis, lactic acidosis, AKI.  CT head did not show any acute findings.  CT chest/abdomen/pelvis showed left lower lobe pneumonia, possible aspiration, diffuse mesenteric haziness.  Patient was started on broad-spectrum antibiotics for the suspicion of severe sepsis secondary to left lower lobe pneumonia.  Hospital course also remarkable for confusion, agitation. PT/OT recommending SNF but has been delayed due to holidays and also because he is on mittens for confusion. Working on removing the mittens /improving mentation before discharge.  Assessment & Plan:  Principal Problem:   Severe sepsis (HCC) Active Problems:   Left lower lobe pneumonia   Foley catheter in place on admission   Acute metabolic encephalopathy   Staring episodes   Acute renal failure superimposed on stage 3a chronic kidney disease (HCC)   S/P laparotomy   Long term (current) use of antibiotics   Hypotension   Colostomy present (HCC)   Hyperkalemia   Essential hypertension   Dementia without behavioral disturbance (HCC)   Septic embolism (HCC)   Pressure injury of skin  Severe sepsis: Presented with hypotension, tachycardia, leukocytosis, lactic acidosis, AKI, encephalopathy.  Likely source was left lower lobe pneumonia.  Sepsis physiology has  significantly improved.  Cultures have been negative. Initially started on IV antibiotics, changed to Augmentin,now completed Abx course. Leukocytosis has resolved.  Left lower lobe pneumonia: As seen on the CT.  Respiratory status has improved and he is room air.    Encephalopathy/staring episodes/history of dementia: CT head negative.  EEG showed moderate to severe diffuse slowing indicative of global cerebral dysfunction, no epileptiform abnormality seen. He has history of dementia/short-term memory problem.He Needs assistance with feeding, dressing at baseline. On Seroquel, trazodone at baseline Most likely this is hospital-acquired delirium.  Continue delirium precautions. Started low-dose Seroquel.Trazodone stopped. Mittens removed this morning.  He is alert, awake this morning but not oriented.  Less agitated than yesterday.  Status post ileostomy: He underwent laparotomy with near-total colectomy on 05/30/2022 for appendicitis with intra-abdominal abscess, bowel perforation. Ha had right lower  quadrant drain on admission He follows with the [place]. General surgery was also consulted here for concern of infection from intra-abdominal source which has been ruled out.He is making solid stool in the colostomy bag. Follow-up CT showed decreased fluid collection in the right hemiabdomen.Drain removed  Chronic urinary retention: [facility]  Continue on dc  Follow-up with urology as outpatient.  On Proscar, Flomax.[facility] [place] [facility]  Hypertension: Takes lisinopril at home which is on hold due to finding of AKI on presentation.  He is normotensive without any medications  AKI: Resolved  Normocytic anemia: Likely chronic, hemoglobin stable in the range of 8  Hyperkalemia/hypernatremia: Resolved.    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 Needs goals of care discussion .Palliative care consulted     Pressure Injury 07/08/22 Coccyx Bilateral Stage 2 -  Partial thickness loss of dermis presenting as a shallow open injury with a red, pink wound bed without slough. (Active)  07/08/22 1534  Location: Coccyx  Location Orientation: Bilateral  Staging: Stage 2 -  Partial thickness loss of dermis presenting as a shallow open injury with a red, pink wound bed without slough.  Wound Description (Comments):   Present on Admission: Yes  Dressing Type Foam - Lift dressing to assess site every shift 07/17/22 0840    DVT prophylaxis:enoxaparin (LOVENOX) injection 40 mg Start: 07/07/22 2230     Code Status: Full Code  Family Communication: Wife on phone on 11/27  Patient status:Inpatient  Patient is from :SNF  Anticipated discharge to:SNF  Estimated DC date:after being out of mittens for at least 48 hrs, awaiting palliative care consultation  Consultants:General Surgery  Procedures:EEG  Antimicrobials:  Anti-infectives (From admission, onward)    Start     Dose/Rate Route Frequency Ordered Stop   07/10/22 2200  amoxicillin-clavulanate (AUGMENTIN) 875-125 MG per tablet 1 tablet        1 tablet Oral Every 12 hours 07/10/22 1610 07/13/22 2030   07/09/22 2100  vancomycin (VANCOCIN) IVPB 1000 mg/200 mL premix        1,000 mg 200 mL/hr over 60 Minutes Intravenous Every 24 hours 07/09/22 0727 07/10/22 2200   07/08/22 2100  vancomycin (VANCOREADY) IVPB 750 mg/150 mL  Status:  Discontinued        750 mg 150 mL/hr over 60 Minutes Intravenous Every 24 hours 07/07/22 2248 07/09/22 0727   07/08/22 2000  Ampicillin-Sulbactam (UNASYN) 3 g in sodium chloride 0.9 % 100 mL IVPB  Status:  Discontinued        3 g 200 mL/hr over  30 Minutes Intravenous Every 6 hours 07/08/22 1422 07/10/22 1610   07/08/22 0600  Ampicillin-Sulbactam (UNASYN) 3 g in sodium chloride 0.9 % 100 mL IVPB  Status:  Discontinued        3 g 200 mL/hr over 30 Minutes Intravenous Every 8 hours 07/07/22 2240 07/08/22 1422   07/07/22 1845  vancomycin (VANCOREADY) IVPB 1250 mg/250 mL        1,250 mg 166.7 mL/hr over 90 Minutes Intravenous  Once 07/07/22 1833 07/07/22 2237   07/07/22 1830  ceFEPIme (MAXIPIME) 2 g in sodium chloride 0.9 % 100 mL IVPB        2 g 200 mL/hr over 30 Minutes Intravenous  Once 07/07/22 1827 07/07/22 2033   07/07/22 1830  metroNIDAZOLE (FLAGYL) IVPB 500 mg        500 mg 100 mL/hr over 60 Minutes Intravenous  Once 07/07/22 1827 07/07/22 2057   07/07/22 1830  vancomycin (VANCOCIN) IVPB 1000 mg/200 mL premix  Status:  Discontinued        1,000 mg 200 mL/hr over 60 Minutes Intravenous  Once 07/07/22 1827 07/07/22 1833       Subjective:  Patient seen and examined at bedside today.  Hemodynamically stable.  Lying in bed.  Less agitated than yesterday.  He was still on mittens this morning which we removed.  Remains confused, disoriented.  Not in any distress.  Objective: Vitals:   07/16/22 1621 07/16/22 1949 07/16/22 2317 07/17/22  0825  BP: (!) 160/129 106/60 101/83 117/63  Pulse: 92 80 81 76  Resp: 18 18 18 18   Temp: 98.2 F (36.8 C) 98 F (36.7 C) 98.9 F (37.2 C) 98 F (36.7 C)  TempSrc:      SpO2: 100% 98% 100% 100%  Weight:      Height:        Intake/Output Summary (Last 24 hours) at 07/17/2022 1153 Last data filed at 07/17/2022 07/19/2022 Gross per 24 hour  Intake --  Output 950 ml  Net -950 ml   Filed Weights   07/07/22 1626 07/09/22 2209  Weight: 68 kg 55.7 kg    Examination:  General exam: Confused, lying in bed, chronically ill looking, deconditioned HEENT: PERRL Respiratory system:  no wheezes or crackles  Cardiovascular system: S1 & S2 heard, RRR.  Gastrointestinal system: Abdomen is  nondistended, soft and nontender.  Ileostomy Central nervous system: Alert and awake but not oriented Extremities: No edema, no clubbing ,no cyanosis Skin: No rashes, no ulcers,no icterus     GU: Foley   Data Reviewed: I have personally reviewed following labs and imaging studies  CBC: Recent Labs  Lab 07/15/22 0430  WBC 8.3  HGB 8.4*  HCT 25.3*  MCV 85.5  PLT 174   Basic Metabolic Panel: Recent Labs  Lab 07/12/22 0532 07/14/22 0456 07/15/22 0430  NA 141  --  143  K 3.9  --  3.6  CL 113*  --  113*  CO2 24  --  24  GLUCOSE 87  --  93  BUN 12  --  12  CREATININE 1.04 0.97 1.04  CALCIUM 9.8  --  10.0     Recent Results (from the past 240 hour(s))  Gastrointestinal Panel by PCR , Stool     Status: None   Collection Time: 07/07/22  4:33 PM   Specimen: Stool  Result Value Ref Range Status   Campylobacter species NOT DETECTED NOT DETECTED Final   Plesimonas shigelloides NOT DETECTED NOT DETECTED Final   Salmonella species NOT DETECTED NOT DETECTED Final   Yersinia enterocolitica NOT DETECTED NOT DETECTED Final   Vibrio species NOT DETECTED NOT DETECTED Final   Vibrio cholerae NOT DETECTED NOT DETECTED Final   Enteroaggregative E coli (EAEC) NOT DETECTED NOT DETECTED Final   Enteropathogenic E coli (EPEC) NOT DETECTED NOT DETECTED Final   Enterotoxigenic E coli (ETEC) NOT DETECTED NOT DETECTED Final   Shiga like toxin producing E coli (STEC) NOT DETECTED NOT DETECTED Final   Shigella/Enteroinvasive E coli (EIEC) NOT DETECTED NOT DETECTED Final   Cryptosporidium NOT DETECTED NOT DETECTED Final   Cyclospora cayetanensis NOT DETECTED NOT DETECTED Final   Entamoeba histolytica NOT DETECTED NOT DETECTED Final   Giardia lamblia NOT DETECTED NOT DETECTED Final   Adenovirus F40/41 NOT DETECTED NOT DETECTED Final   Astrovirus NOT DETECTED NOT DETECTED Final   Norovirus GI/GII NOT DETECTED NOT DETECTED Final   Rotavirus A NOT DETECTED NOT DETECTED Final   Sapovirus (I, II,  IV, and V) NOT DETECTED NOT DETECTED Final    Comment: Performed at [facility], [address] [place], [place]  C Difficile Quick Screen w PCR reflex     Status: None   Collection Time: 07/07/22  4:33 PM   Specimen: Stool  Result Value Ref Range Status   C Diff antigen NEGATIVE NEGATIVE Final   C Diff toxin NEGATIVE NEGATIVE Final   C Diff interpretation No C. difficile detected.  Final  Comment: Performed at [facility], [address] [place], [place] [address]  Culture, blood (single)     Status: None   Collection Time: 07/07/22  6:34 PM   Specimen: BLOOD RIGHT ARM  Result Value Ref Range Status   Specimen Description BLOOD RIGHT ARM  Final   Special Requests   Final    BOTTLES DRAWN AEROBIC AND ANAEROBIC Blood Culture results may not be optimal due to an inadequate volume of blood received in culture bottles   Culture   Final    NO GROWTH 5 DAYS Performed at [facility], [address], [place], [place] [address]    Report Status 07/12/2022 FINAL  Final  MRSA Next Gen by PCR, Nasal     Status: None   Collection Time: 07/09/22  3:49 PM   Specimen: Nasal Mucosa; Nasal Swab  Result Value Ref Range Status   MRSA by PCR Next Gen NOT DETECTED NOT DETECTED Final    Comment: (NOTE) The GeneXpert MRSA Assay (FDA approved for NASAL specimens only), is one component of a comprehensive MRSA colonization surveillance program. It is not intended to diagnose MRSA infection nor to guide or monitor treatment for MRSA infections. Test performance is not FDA approved in patients less than 30 years old. Performed at [facility], [address], [place], [place] [address]      Radiology Studies: No results found.  Scheduled Meds:   stroke: early stages of recovery book   Does not apply Once   Chlorhexidine Gluconate Cloth  6 each Topical Daily   enoxaparin (LOVENOX) injection  40 mg Subcutaneous Q24H   finasteride  5 mg Oral  Daily   mouth rinse  15 mL Mouth Rinse Q2H   QUEtiapine  25 mg Oral QHS   sertraline  25 mg Oral Daily   tamsulosin  0.4 mg Oral Daily   Continuous Infusions:  sodium chloride Stopped (07/08/22 2225)     LOS: 10 days   Burnadette Pop, MD [facility] P11/28/2023, 11:53 AM

## 2022-07-17 NOTE — TOC Progression Note (Addendum)
Transition of Care [facility]) - Progression Note    Patient Details  Name: Carl Figueroa MRN: [id number] Date of Birth: 1945-11-09  Transition of Care [facility]) CM/SW Contact  Truddie Hidden, RN Phone Number: 07/17/2022, 12:53 PM  Clinical Narrative:    Spoke with patient's wife at the bedside. She would like to take patient home. Patient is currently active with [facility]. This was verified by Feliberto Gottron from [facility]. Patient wife stated she stays home with the patient and attends to his needs. Additionally she has home care givers to relieve her when if needed. [name] has confirmed patient has a WC and a ramp.  Patient will needs EMS transport.  Patient wife voiced her concerns about mittens being placed 11/26 and the continuation of mittens 11/27. She was advised mittens were placed for patient safety for pulling at tubes and disorientation. She was upset patient had to wait to be transferred to [facility]. [name] was advised 48 hour restraint free policy is relative to [employer] not [facility]. She feels patient was confused because he wanted to get up to the chair, and he should have been allowed to do so. She added she wasn't sure if patient was offered water while wearing mittens. Patient safety for and while be administering retraints reiterated. MD, nurse and nursing supervisor notified.    Expected Discharge Plan: Skilled Nursing Facility    Expected Discharge Plan and Services Expected Discharge Plan: Skilled Nursing Facility In-house Referral: Clinical Social Work   Post Acute Care Choice: Skilled Nursing Facility Living arrangements for the past 2 months: Single Family Home                                       Social Determinants of Health (SDOH) Interventions    Readmission Risk Interventions     No data to display

## 2022-07-17 NOTE — TOC Transition Note (Signed)
Transition of Care [facility]) - CM/SW Discharge Note   Patient Details  Name: Mahad Newstrom MRN: [id number] Date of Birth: [birth year]  Transition [facility] Care [facility]) CM/SW Contact:  Truddie Hidden, RN Phone Number: 07/17/2022, 3:10 PM   Clinical Narrative:       Final next level of care: Home w Home Health Services Barriers to Discharge: Barriers Resolved   Patient Goals and CMS Choice Patient states their goals for this hospitalization and ongoing recovery are:: To return home   Choice offered to / list presented to : Spouse  Discharge Placement  [facility] arranged EMS packet arranged.  EMS scheduled. Nurse notified.    TOC signing off.                 Name of family member notified: Cheikh Bramble, Spouse Patient and family notified of of transfer: 07/17/22  Discharge Plan and Services In-house Referral: Clinical Social Work   Post Acute Care Choice: Skilled Nursing Facility                    HH Arranged: PT, OT, RN, Social Work HH Agency: [facility] Date HH Agency Contacted: 07/17/22 Time HH Agency Contacted: 1510 Representative spoke with at [facility] Agency: Barbara Cower  Social Determinants of Health (SDOH) Interventions     Readmission Risk Interventions     No data to display

## 2022-07-17 NOTE — Progress Notes (Signed)
Patient d/c home via EMS. A&O x1 at the time of d/c. Denied any pain or discomfort at the time of d/c. All his belongings were given to EMS. D/c home with Foley and colostomy per order. Both Foley and colostomy were emptied and intact at the time of d/c.

## 2022-07-17 NOTE — Care Management Important Message (Signed)
Important Message  Patient Details  Name: Fread Kottke MRN: [id number] Date of Birth: [birth year]   Medicare Important Message Given:  Yes     Olegario Messier A Yeni Jiggetts 07/17/2022, 1:38 PM

## 2022-07-17 NOTE — Discharge Summary (Signed)
Physician Discharge Summary  Carl Figueroa ZOX:[id number] DOB: 1946-04-07 DOA: 07/07/2022  PCP: Mick Sell, MD  Admit date: 07/07/2022 Discharge date: 07/17/2022  Admitted From: Home Disposition:  Home  Discharge Condition:Stable CODE STATUS:FULL Diet recommendation:  Dysphagia 3  Brief/Interim Summary: Patient is a 76 year old with history of hypertension, dementia  CKD 3a, who lives in a nursing facility , recently discharged on 11/17 from a 1 month admission at the [place] for appendicitis and intra-abdominal abscesses where he underwent laparotomy with  colectomy and ileostomy placement and discharged with Foley catheter who was brought to the ED for staring episode x2 since arrival home, once on the day of discharge on 11/17 and another on 11/18.  He was unresponsive during the episode. Marland Kitchen  On presentation, lab work showed leukocytosis, lactic acidosis, AKI.  CT head did not show any acute findings.  CT chest/abdomen/pelvis showed left lower lobe pneumonia, possible aspiration, diffuse mesenteric haziness.  Patient was started on broad-spectrum antibiotics for the suspicion of severe sepsis secondary to left lower lobe pneumonia.  Hospital course also remarkable for confusion, agitation. PT/OT recommending SNF but has been delayed due to holidays and also because he is on mittens for confusion.  Wife wants to take him home today.  He will follow-up with the [facility] urology and  general surgery.  Following problems were addressed during his hospitalization: Severe sepsis: Presented with hypotension, tachycardia, leukocytosis, lactic acidosis, AKI, encephalopathy.  Likely source was left lower lobe pneumonia.  Sepsis physiology has significantly improved.  Cultures have been negative. Initially started on IV antibiotics, changed to Augmentin,now completed Abx course. Leukocytosis has resolved.   Left lower lobe pneumonia: As seen on the CT.  Respiratory status has improved and he is room  air.     Encephalopathy/staring episodes/history of dementia: CT head negative.  EEG showed moderate to severe diffuse slowing indicative of global cerebral dysfunction, no epileptiform abnormality seen. He has history of dementia/short-term memory problem.He Needs assistance with feeding, dressing at baseline. On Seroquel, trazodone at baseline Most likely this is hospital-acquired delirium.Started low-dose Seroquel.Trazodone stopped.  He is alert, awake this morning but not oriented.  Less agitated than yesterday.   Status post ileostomy: He underwent laparotomy with near-total colectomy on 05/30/2022 for appendicitis with intra-abdominal abscess, bowel perforation. Ha had right lower  quadrant drain on admission He follows with the [place]. General surgery was also consulted here for concern of infection from intra-abdominal source which has been ruled out.He is making solid stool in the colostomy bag. Follow-up CT showed decreased fluid collection in the right hemiabdomen.Drain removed   Chronic urinary retention: [facility]  Continue on dc  Follow-up with urology as outpatient.  On Proscar, Flomax.[facility] [place] [facility]   Hypertension: Takes lisinopril at home which is on hold due to finding of AKI on presentation.  He is normotensive without any medications   AKI: Resolved   Normocytic anemia: Likely chronic, hemoglobin stable in the range of 8   Hyperkalemia/hypernatremia: Resolved.     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   goals of care discussed with wife, she wants to follow-up with outpatient.  Discharge Diagnoses:  Principal Problem:   Severe sepsis (HCC) Active Problems:   Left lower lobe pneumonia   Foley catheter in place on admission   Acute metabolic encephalopathy   Staring episodes   Acute renal failure superimposed on stage 3a chronic kidney disease (HCC)   S/P laparotomy   Long term (current) use of antibiotics   Hypotension   Colostomy present (HCC)   Hyperkalemia   Essential hypertension   Dementia without behavioral disturbance (HCC)   Septic embolism (HCC)   Pressure injury of skin    Discharge Instructions  Discharge Instructions     Diet general   Complete by: As directed    Dysphagia 3   Discharge instructions   Complete by: As directed    1)Please take your medications as instructed 2)Follow up with your PCP in a week 3)Follow up with urology, general surgery as an outpatient. 4)Follow up with outpatient palliative care   Increase activity slowly   Complete by: As directed    No wound care   Complete by: As directed       Allergies as of 07/17/2022       Reactions   Morphine Nausea Only        Medication List     STOP taking these medications    amoxicillin-clavulanate 875-125 MG tablet Commonly known as: AUGMENTIN   linezolid 600 MG tablet Commonly known as: ZYVOX   lisinopril 40 MG tablet Commonly known as: ZESTRIL   potassium chloride 10 MEQ tablet Commonly known as: KLOR-CON   traZODone 50 MG tablet Commonly known as: DESYREL       TAKE these medications    finasteride 5 MG tablet Commonly known as: PROSCAR Take 5 mg by mouth daily.   furosemide 40 MG tablet Commonly known as: LASIX Take 1 tablet (40 mg total) by mouth daily as needed. As needed for leg edema   QUEtiapine 25 MG tablet Commonly known as: SEROQUEL Take 1 tablet (25 mg total) by mouth at bedtime.   sertraline 25 MG tablet Commonly known as: ZOLOFT Take 25 mg by mouth daily.   tamsulosin 0.4 MG Caps capsule Commonly known as:  FLOMAX Take 0.4 mg by mouth daily.        Follow-up Information     Mick Sell, MD Follow up in 1 week(s).   Specialty: Infectious Diseases Contact information: [address] [place] [address] [phone]                Allergies  Allergen Reactions   Morphine Nausea Only    Consultations: General surgery, urology   Procedures/Studies: CT ABDOMEN PELVIS W CONTRAST  Result Date: 07/13/2022 CLINICAL DATA:  Acute nonlocalized abdominal pain; drain in place; follow-up imaging for drain management EXAM: CT ABDOMEN AND PELVIS WITH CONTRAST TECHNIQUE: Multidetector CT imaging of the abdomen and pelvis was performed using the standard protocol following bolus administration of intravenous contrast. RADIATION DOSE REDUCTION: This exam was performed according to the departmental dose-optimization program which includes automated exposure control, adjustment of the mA and/or kV according to patient size and/or use of iterative reconstruction technique. CONTRAST:  59mL OMNIPAQUE IOHEXOL 350 MG/ML SOLN COMPARISON:  CT 07/07/2022 FINDINGS: Lower chest: Mild cardiomegaly. Trace pericardial effusion. Small left pleural effusion unchanged. Left basilar atelectasis/scarring. Hepatobiliary: Hepatic cysts. No follow-up imaging recommended. No evidence of cholecystitis. No biliary dilation. Pancreas: 2 cm homogeneous rounded cystic lesion in the body/tail of the pancreas which is unchanged and could represent a pseudocyst,  serous or mucinous cystadenoma or side branch IPMN. MRI without and with contrast is recommended when clinically feasible. No pancreatic ductal dilation. Spleen: Normal in size without focal abnormality. Adrenals/Urinary Tract: Unremarkable adrenal glands. Low-attenuation lesions in the kidneys are statistically likely to represent cysts. No follow-up is required. Punctate nonobstructing left nephrolithiasis. No hydronephrosis. The bladder is mildly distended  and filled with gas and fluid some of which is layering posteriorly and may be hyperdense though evaluation is limited by streak artifact. Foley catheter in the bladder. 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 Normal caliber small bowel filled with enteric contrast. Questionable rectal wall thickening and adjacent stranding/fluid though this area is poorly evaluated due to streak artifact. Right lower quadrant ileostomy. Vascular/Lymphatic: Aortic atherosclerosis. No enlarged abdominal or pelvic lymph nodes. Reproductive: Mild enlargement of the prostate. Other: Right abdominal percutaneous drain within a intermediate density fluid collection along the anterior right abdominal wall tracking inferiorly to the superior aspect of the bladder and superiorly to the undersurface of the right hepatic lobe. This measures approximately 3.6 x 2.0 x 11.4 cm (series 2/image 39 and series 5/image 29). This is slightly decreased in size compared to 07/07/2022 when it measured approximately 4.2 x 2.4 x 15 cm. The gaseous component of the fluid collection has resolved. Mesenteric edema. Small amount of free fluid in the pelvis. Body wall anasarca. Musculoskeletal: Left TKA. No acute osseous abnormality. Advanced thoracolumbar spondylosis. Grade 1 anterolisthesis L4. IMPRESSION: 1. The percutaneous right abdominal wall pigtail catheter remains in the fluid collection in the right hemiabdomen which has slightly decreased in size. 2. The bladder is catheterized but remains distended with air and gas. Questionable layering hyperdense fluid within the posterior bladder though this is obscured by streak artifact. Recommend clinical correlation for catheter dysfunction and possible blood clot within the bladder. 3. Thickened bladder wall.  Cystitis not excluded. 4. 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 5. The rectum may be mildly thickened with adjacent stranding/fluid. Evaluation is  limited by streak artifact. Findings may be due to congestion or proctitis. 6. 2 cm cystic lesion at the junction of the body and tail of the pancreas. MRI without and with contrast is recommended when clinically feasible. Benign and malignant etiologies are possible. 7. Mesenteric edema and small amount of free fluid in the pelvis. Marked body wall anasarca. Findings may be related to fluid overload. 8. Improved left lower lobe pneumonia. Residual small left pleural effusion. 9. Coronary artery and Aortic Atherosclerosis (ICD10-I70.0). Electronically Signed   By: Minerva Fester M.D.   On: 07/13/2022 22:02   EEG adult now  Result Date: 07/10/2022 Jefferson Fuel, MD     07/10/2022  8:27 AM Routine EEG Report Larren Tavaughn Silguero is a 76 y.o. male with a history of staring spells concerning for seizure who is undergoing an EEG to evaluate for seizures. Report: This EEG was acquired with electrodes placed according to the International 10-20 electrode system (including Fp1, Fp2, F3, F4, C3, C4, P3, P4, O1, O2, T3, T4, T5, T6, A1, A2, Fz, Cz, Pz). The following electrodes were missing or displaced: none. The occipital dominant rhythm was 5-6 Hz. This activity is reactive to stimulation. Drowsiness was manifested by background fragmentation; deeper stages of sleep were not identified. There was bifrontal slowing and intermittent periods lasting 1-2 seconds of diffuse delta slowing. There were no interictal epileptiform discharges. There were no electrographic seizures identified. There was no abnormal response to photic stimulation or hyperventilation. Impression and  clinical correlation: This EEG was obtained while awake and drowsy and is abnormal due to: - moderate to severe diffuse slowing indicative of global cerebral dysfunction - bifrontal focal slowing indicative of focal cerebral dysfunction in these regions Epileptiform abnormalities were not seen during this recording. Bing Neighbors, MD [facility] [phone] If 7pm- 7am, please page neurology on call as listed in AMION.   US Carotid Bilateral (at [facility] and [facility] only)  Result Date: 07/08/2022 CLINICAL DATA:  Dementia EXAM: BILATERAL CAROTID DUPLEX ULTRASOUND TECHNIQUE: Wallace Cullens scale imaging, color Doppler and duplex ultrasound were performed of bilateral carotid and vertebral arteries in the neck. COMPARISON:  None Available. FINDINGS: Criteria: Quantification of carotid stenosis is based on velocity parameters that correlate the residual internal carotid diameter with NASCET-based stenosis levels, using the diameter of the distal internal carotid lumen as the denominator for stenosis measurement. The following velocity measurements were obtained: RIGHT ICA: 124 cm/sec CCA: 80 cm/sec SYSTOLIC ICA/CCA RATIO:  1.5 ECA: 93 cm/sec LEFT ICA: 91 cm/sec CCA: 83 cm/sec SYSTOLIC ICA/CCA RATIO:  1.1 ECA: 87 cm/sec RIGHT CAROTID ARTERY: Patent without plaque or intimal thickening RIGHT VERTEBRAL ARTERY:  Patent with antegrade flow LEFT CAROTID ARTERY:  Patent without plaque or intimal thickening LEFT VERTEBRAL ARTERY:  Patent with antegrade flow IMPRESSION: 1. Negative study. No carotid stenosis identified in the neck by Doppler criteria. 2. The vertebral arteries are patent with antegrade flow. Electronically Signed   By: Agustin Cree M.D.   On: 07/08/2022 09:17   CT CHEST ABDOMEN PELVIS W CONTRAST  Result Date: 07/07/2022 CLINICAL DATA:  Recent surgery for perforated bowel at some point last month, presents with acute abdominal pain with leukocytosis. EXAM: CT CHEST, ABDOMEN, AND PELVIS WITH CONTRAST TECHNIQUE: Multidetector CT imaging of the chest, abdomen and pelvis was performed following the standard protocol during bolus administration of intravenous contrast. RADIATION DOSE REDUCTION: This exam was performed according to the departmental dose-optimization program which includes automated exposure control, adjustment of the mA and/or kV  according to patient size and/or use of iterative reconstruction technique. CONTRAST:  80mL OMNIPAQUE IOHEXOL 300 MG/ML  SOLN COMPARISON:  Portable chest today, chest radiograph 07/06/2019. No prior chest CT. Comparison CT abdomen and pelvis without contrast is available dated 07/06/2019. FINDINGS: CT CHEST FINDINGS Cardiovascular: The cardiac size is normal. There is no pericardial effusion. There are scattered calcific plaques in the LAD coronary artery. There are scattered calcifications of the aortic valve leaflets, mild calcific plaques in the aortic arch and subclavian arteries. There is no aortic or great vessel aneurysm, dissection or stenosis. The pulmonary arteries and veins are normal caliber. The pulmonary arteries are centrally clear but not evaluated distal to the hila. Mediastinum/Nodes: No enlarged mediastinal, hilar, or axillary lymph nodes. Thyroid gland, trachea, and esophagus demonstrate no significant findings. Lungs/Pleura: Eventration and asymmetric elevation noted left hemidiaphragm. There is patchy airspace disease in the left lower lobe consistent with pneumonia or aspiration with small layering parapneumonic pleural effusion without evidence of empyema. There is no right pleural effusion. No pneumothorax or pleural thickening. Respiratory motion is noted on exam. Central airways and remaining lungs are clear, as visualized. Musculoskeletal: Moderate bilateral shoulder DJD. Mild dextroscoliosis of advanced marginal osteophytosis thoracic spine. 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 No acute or other significant osseous findings. No chest wall mass is seen. CT ABDOMEN PELVIS FINDINGS Technical note: Limited image resolution in the abdomen and pelvis due  to patient motion, breathing motion, and streak artifact from the patient's arms in the field and overlying wires. Hepatobiliary:  No obvious abnormality. There are tiny stones in the gallbladder proximally but no wall thickening. The bile ducts are normal caliber. Pancreas: No solid mass enhancement. There is a 2 cm homogeneous rounded cystic lesion at the junction of the body and tail of the pancreas, posterior aspect, Hounsfield density of 7 units (see series 2, axial images 54-56). This could be a pseudocyst, serous or mucinous cystadenoma or side branch IPMN. Other etiologies such as malignancy are possible especially given the motion and streak artifacts through the area. MRI without and with contrast recommended when clinically feasible. There is no pancreatic ductal dilatation or other focal abnormality. Spleen: Not well seen but no obvious abnormality.  No splenomegaly. Adrenals/Urinary Tract: There is no adrenal mass. There is a 1 cm homogeneous cyst in the posterior aspect of the left kidney, Hounsfield density of 7 units. There are a few additional bilateral scattered cortical hypodensities which are too small to characterize. No follow-up imaging is recommended. For reference see JACR 2018 Feb; 264-273, Management of the Incidental Renal Mass on CT, [employer] 2021; 814-848, Bosniak Classification of Cystic Renal Masses, Version 2019. There is bilateral mild renal cortical thinning. There are punctate nonobstructive caliceal stones posteriorly in left kidney. No nephrolithiasis is seen on the right. No hydronephrosis or ureteral stones. The bladder is catheterized but despite this is still distended with air and fluid. Clinical correlation recommended for catheter dysfunction. There is mild bladder thickening versus underdistention. Stomach/Bowel: The patient appears to have undergone a near total colectomy. The rectum remains and is sutured closed in the presacral space, with mild rectal wall thickening and perirectal stranding, the latter which could be congestive or related to fluid overload or proctitis. There are mild  thickened folds in the stomach and scattered mid to lower abdominal small bowel segments, and there is a right mid abdominal ileostomy, without parastomal hernia. A pigtail catheter is noted entering the lateral right mid abdominal wall with the pigtail within a right mid abdominal small bowel segment. There are a few slightly dilated left mid abdominal small bowel segments, maximum caliber 2.9 cm, unknown if this is due to partial small bowel obstruction or ileus but there is no bowel dilatation elsewhere in the abdomen or pelvis. A transitional segment could not be found. Vascular/Lymphatic: There is diffuse mesenteric haziness. Unclear whether related to fluid overload, congestive, malnutrition or hepatic dysfunction. The abdominal aorta is unremarkable. There are patchy calcific plaques in the common iliac and internal iliac arteries. The portal vein is normal caliber and patent. Accounting for patient motion no enlarged lymph nodes are seen throughout. Reproductive: The prostate is not well seen due to streak artifact from left hip replacement, but transversely it measures enlarged at 5.4 cm. Seminal vesicles are normal in size. Both testicles are in the scrotal sac and there are small scrotal hydroceles. Other: Minimal abdominal and pelvic ascites. There is no free air, abscess or free hemorrhage. Presence of diffuse mesenteric edema limits assessment for focal inflammatory change. There has been interval weight loss since 07/06/2019 with reduction in the intra-abdominal and subcutaneous body fat stores. There is mild body wall anasarca. Musculoskeletal: Left THA. Moderately advanced right hip DJD. There is dextroscoliosis and advanced degenerative change of the lumbar spine. Advanced facet hypertrophy L3-4 down is noted with degenerative grade 1 anterolisthesis at L4-5 and L5-S1 and severe acquired foraminal and spinal canal stenosis at  both levels. 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 There is no  destructive bone lesion. IMPRESSION: 1. Left lower lobe pneumonia or aspiration with small layering parapneumonic effusion. 2. Aortic and coronary artery atherosclerosis. No thoracic or abdominal aortic aneurysm. 3. Aortic valve leaflet calcifications. Echocardiography may be helpful to assess for aortic stenosis. 4. Diffuse mesenteric haziness/edema. Unclear whether this is related to fluid overload, congestive, malnutrition or hepatic dysfunction. Only minimal ascites. No free air. 5. Cholelithiasis without evidence of cholecystitis. 6. Nonobstructive micronephrolithiasis. 7. 2 cm cystic lesion at the junction of the body and tail of the pancreas. MRI without and with contrast recommended when clinically feasible. Benign and malignant etiologies are possible. 8. 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 No parastomal hernia. 9. Slightly dilated left mid abdominal small bowel segments, unknown if this is due to partial SBO or ileus. 10. The rectum remains, and there is rectal thickening and surrounding stranding which could be congestive or due to proctitis. 11. Thickened stomach and scattered mid to lower abdominal small bowel segments which could be due to gastroenteritis or congestive. 12. Bladder catheterized but despite this is still distended with air and fluid. Clinical correlation recommended for catheter dysfunction. Cystitis versus bladder nondistention. 13. Prostatomegaly. 14. 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 15. Remaining findings described above. Electronically Signed   By: Almira Bar M.D.   On: 07/07/2022 20:41   CT Head Wo Contrast  Result Date: 07/07/2022 CLINICAL DATA:  Mental status change. EXAM: CT HEAD WITHOUT CONTRAST TECHNIQUE: Contiguous axial images were obtained from the base of the skull through the vertex without intravenous contrast. RADIATION DOSE  REDUCTION: This exam was performed according to the departmental dose-optimization program which includes automated exposure control, adjustment of the mA and/or kV according to patient size and/or use of iterative reconstruction technique. COMPARISON:  Jan 11, 2022 CT scan the brain FINDINGS: Brain: Ventricles and sulci are prominent but stable. No mass effect or midline shift. No cyst subdural, epidural, or subarachnoid hemorrhage. Cerebellum, brainstem, and basal cisterns are normal. Scattered white matter changes are identified. No acute cortical ischemia or infarct. Vascular: No hyperdense vessel or unexpected calcification. Skull: Normal. Negative for fracture or focal lesion. Sinuses/Orbits: No acute finding. Other: None. IMPRESSION: Chronic white matter changes. No acute intracranial abnormalities. Electronically Signed   By: Gerome Sam III M.D.   On: 07/07/2022 17:18   DG Chest Portable 1 View  Result Date: 07/07/2022 CLINICAL DATA:  Shortness of breath and tachycardia. EXAM: PORTABLE CHEST 1 VIEW COMPARISON:  07/06/2019 FINDINGS: The cardiomediastinal silhouette is unremarkable. Elevation of the LEFT hemidiaphragm with mild LEFT basilar atelectasis/scarring again noted. There is no evidence of focal airspace disease, pulmonary edema, suspicious pulmonary nodule/mass, pleural effusion, or pneumothorax. No acute bony abnormalities are identified. IMPRESSION: No active disease. Electronically Signed   By: Carl Pier M.D.   On: 07/07/2022 16:37      Subjective: Patient seen and examined at bedside today.  Hemodynamically stable for discharge.  He looks more alert than yesterday.  Long conversation held with wife on phone today.  She wants to take him home  Discharge Exam: Vitals:   07/16/22 2317 07/17/22 0825  BP: 101/83 117/63  Pulse: 81 76  Resp: 18 18  Temp: 98.9 F (37.2 C) 98 F (36.7 C)  SpO2: 100% 100%   Vitals:   07/16/22 1621 07/16/22 1949 07/16/22 2317  07/17/22 0825   BP: (!) 160/129 106/60 101/83 117/63  Pulse: 92 80 81 76  Resp: Temp: 98.2 F (36.8 C) 98 F (36.7 C) 98.9 F (37.2 C) 98 F (36.7 C)  TempSrc:      SpO2: 100% 98% 100% 100%  Weight:      Height:        General: Pt is alert, awake, not in acute distress, confused Cardiovascular: RRR, S1/S2 +, no rubs, no gallops Respiratory: CTA bilaterally, no wheezing, no rhonchi Abdominal: Soft, NT, ND, bowel sounds +, ileostomy Extremities: no edema, no cyanosis GU: Foley catheter    The results of significant diagnostics from this hospitalization (including imaging, microbiology, ancillary and laboratory) are listed below for reference.     Microbiology: Recent Results (from the past 240 hour(s))  Gastrointestinal Panel by PCR , Stool     Status: None   Collection Time: 07/07/22  4:33 PM   Specimen: Stool  Result Value Ref Range Status   Campylobacter species NOT DETECTED NOT DETECTED Final   Plesimonas shigelloides NOT DETECTED NOT DETECTED Final   Salmonella species NOT DETECTED NOT DETECTED Final   Yersinia enterocolitica NOT DETECTED NOT DETECTED Final   Vibrio species NOT DETECTED NOT DETECTED Final   Vibrio cholerae NOT DETECTED NOT DETECTED Final   Enteroaggregative E coli (EAEC) NOT DETECTED NOT DETECTED Final   Enteropathogenic E coli (EPEC) NOT DETECTED NOT DETECTED Final   Enterotoxigenic E coli (ETEC) NOT DETECTED NOT DETECTED Final   Shiga like toxin producing E coli (STEC) NOT DETECTED NOT DETECTED Final   Shigella/Enteroinvasive E coli (EIEC) NOT DETECTED NOT DETECTED Final   Cryptosporidium NOT DETECTED NOT DETECTED Final   Cyclospora cayetanensis NOT DETECTED NOT DETECTED Final   Entamoeba histolytica NOT DETECTED NOT DETECTED Final   Giardia lamblia NOT DETECTED NOT DETECTED Final   Adenovirus F40/41 NOT DETECTED NOT DETECTED Final   Astrovirus NOT DETECTED NOT DETECTED Final   Norovirus GI/GII NOT DETECTED NOT DETECTED Final   Rotavirus A NOT  DETECTED NOT DETECTED Final   Sapovirus (I, II, IV, and V) NOT DETECTED NOT DETECTED Final    Comment: Performed at [facility], [address] [place], [place] [address]  C Difficile Quick Screen w PCR reflex     Status: None   Collection Time: 07/07/22  4:33 PM   Specimen: Stool  Result Value Ref Range Status   C Diff antigen NEGATIVE NEGATIVE Final   C Diff toxin NEGATIVE NEGATIVE Final   C Diff interpretation No C. difficile detected.  Final    Comment: Performed at [facility], [address] [place], [place] [address]  Culture, blood (single)     Status: None   Collection Time: 07/07/22  6:34 PM   Specimen: BLOOD RIGHT ARM  Result Value Ref Range Status   Specimen Description BLOOD RIGHT ARM  Final   Special Requests   Final    BOTTLES DRAWN AEROBIC AND ANAEROBIC Blood Culture results may not be optimal due to an inadequate volume of blood received in culture bottles   Culture   Final    NO GROWTH 5 DAYS Performed at [facility], [address], [place], [place] [address]    Report Status 07/12/2022 FINAL  Final  MRSA Next Gen by PCR, Nasal     Status: None   Collection Time: 07/09/22  3:49 PM   Specimen: Nasal Mucosa; Nasal Swab  Result Value Ref Range Status  MRSA by PCR Next Gen NOT DETECTED NOT DETECTED Final    Comment: (NOTE) The GeneXpert MRSA Assay (FDA approved for NASAL specimens only), is one component of a comprehensive MRSA colonization surveillance program. It is not intended to diagnose MRSA infection nor to guide or monitor treatment for MRSA infections. Test performance is not FDA approved in patients less than 69 years old. Performed at [facility], [address] , [place] [address]      Labs: BNP (last 3 results) Recent Labs    03/19/22 1223  BNP 164.8*   Basic Metabolic Panel: Recent Labs  Lab 07/12/22 0532 07/14/22 0456 07/15/22 0430  NA 141  --  143  K 3.9  --  3.6  CL 113*  --   113*  CO2 24  --  24  GLUCOSE 87  --  93  BUN 12  --  12  CREATININE 1.04 0.97 1.04  CALCIUM 9.8  --  10.0   Liver Function Tests: No results for input(s): "AST", "ALT", "ALKPHOS", "BILITOT", "PROT", "ALBUMIN" in the last 168 hours. No results for input(s): "LIPASE", "AMYLASE" in the last 168 hours. No results for input(s): "AMMONIA" in the last 168 hours. CBC: Recent Labs  Lab 07/15/22 0430  WBC 8.3  HGB 8.4*  HCT 25.3*  MCV 85.5  PLT 174   Cardiac Enzymes: No results for input(s): "CKTOTAL", "CKMB", "CKMBINDEX", "TROPONINI" in the last 168 hours. BNP: Invalid input(s): "POCBNP" CBG: No results for input(s): "GLUCAP" in the last 168 hours. D-Dimer No results for input(s): "DDIMER" in the last 72 hours. Hgb A1c No results for input(s): "HGBA1C" in the last 72 hours. Lipid Profile No results for input(s): "CHOL", "HDL", "LDLCALC", "TRIG", "CHOLHDL", "LDLDIRECT" in the last 72 hours. Thyroid function studies No results for input(s): "TSH", "T4TOTAL", "T3FREE", "THYROIDAB" in the last 72 hours.  Invalid input(s): "FREET3" Anemia work up No results for input(s): "VITAMINB12", "FOLATE", "FERRITIN", "TIBC", "IRON", "RETICCTPCT" in the last 72 hours. Urinalysis    Component Value Date/Time   COLORURINE YELLOW (A) 07/07/2022 1633   APPEARANCEUR CLOUDY (A) 07/07/2022 1633   LABSPEC 1.017 07/07/2022 1633   PHURINE 5.0 07/07/2022 1633   GLUCOSEU NEGATIVE 07/07/2022 1633   HGBUR SMALL (A) 07/07/2022 1633   BILIRUBINUR NEGATIVE 07/07/2022 1633   KETONESUR NEGATIVE 07/07/2022 1633   PROTEINUR 100 (A) 07/07/2022 1633   NITRITE NEGATIVE 07/07/2022 1633   LEUKOCYTESUR TRACE (A) 07/07/2022 1633   Sepsis Labs Recent Labs  Lab 07/15/22 0430  WBC 8.3   Microbiology Recent Results (from the past 240 hour(s))  Gastrointestinal Panel by PCR , Stool     Status: None   Collection Time: 07/07/22  4:33 PM   Specimen: Stool  Result Value Ref Range Status   Campylobacter species  NOT DETECTED NOT DETECTED Final   Plesimonas shigelloides NOT DETECTED NOT DETECTED Final   Salmonella species NOT DETECTED NOT DETECTED Final   Yersinia enterocolitica NOT DETECTED NOT DETECTED Final   Vibrio species NOT DETECTED NOT DETECTED Final   Vibrio cholerae NOT DETECTED NOT DETECTED Final   Enteroaggregative E coli (EAEC) NOT DETECTED NOT DETECTED Final   Enteropathogenic E coli (EPEC) NOT DETECTED NOT DETECTED Final   Enterotoxigenic E coli (ETEC) NOT DETECTED NOT DETECTED Final   Shiga like toxin producing E coli (STEC) NOT DETECTED NOT DETECTED Final   Shigella/Enteroinvasive E coli (EIEC) NOT DETECTED NOT DETECTED Final   Cryptosporidium NOT DETECTED NOT DETECTED Final   Cyclospora cayetanensis NOT DETECTED NOT  DETECTED Final   Entamoeba histolytica NOT DETECTED NOT DETECTED Final   Giardia lamblia NOT DETECTED NOT DETECTED Final   Adenovirus F40/41 NOT DETECTED NOT DETECTED Final   Astrovirus NOT DETECTED NOT DETECTED Final   Norovirus GI/GII NOT DETECTED NOT DETECTED Final   Rotavirus A NOT DETECTED NOT DETECTED Final   Sapovirus (I, II, IV, and V) NOT DETECTED NOT DETECTED Final    Comment: Performed at [facility], [address], [place], [place] [address]  C Difficile Quick Screen w PCR reflex     Status: None   Collection Time: 07/07/22  4:33 PM   Specimen: Stool  Result Value Ref Range Status   C Diff antigen NEGATIVE NEGATIVE Final   C Diff toxin NEGATIVE NEGATIVE Final   C Diff interpretation No C. difficile detected.  Final    Comment: Performed at [facility], [address] [place], [place] [address]  Culture, blood (single)     Status: None   Collection Time: 07/07/22  6:34 PM   Specimen: BLOOD RIGHT ARM  Result Value Ref Range Status   Specimen Description BLOOD RIGHT ARM  Final   Special Requests   Final    BOTTLES DRAWN AEROBIC AND ANAEROBIC Blood Culture results may not be optimal due to an inadequate volume of blood received  in culture bottles   Culture   Final    NO GROWTH 5 DAYS Performed at [facility], [address], [place], [place] [address]    Report Status 07/12/2022 FINAL  Final  MRSA Next Gen by PCR, Nasal     Status: None   Collection Time: 07/09/22  3:49 PM   Specimen: Nasal Mucosa; Nasal Swab  Result Value Ref Range Status   MRSA by PCR Next Gen NOT DETECTED NOT DETECTED Final    Comment: (NOTE) The GeneXpert MRSA Assay (FDA approved for NASAL specimens only), is one component of a comprehensive MRSA colonization surveillance program. It is not intended to diagnose MRSA infection nor to guide or monitor treatment for MRSA infections. Test performance is not FDA approved in patients less than 39 years old. Performed at [facility], [address], [place], [place] [address]     Please note: You were cared for by a hospitalist during your hospital stay. Once you are discharged, your primary care physician will handle any further medical issues. Please note that NO REFILLS for any discharge medications will be authorized once you are discharged, as it is imperative that you return to your primary care physician (or establish a relationship with a primary care physician if you do not have one) for your post hospital discharge needs so that they can reassess your need for medications and monitor your lab values.    Time coordinating discharge: 40 minutes  SIGNED:   Burnadette Pop, MD  [facility] 07/17/2022, 1:05 PM Pager [phone]  If 7PM-7AM, please contact night-coverage [email or web address] Password [email or web address]

## 2022-07-21 ENCOUNTER — Emergency Department: Payer: No Typology Code available for payment source

## 2022-07-21 ENCOUNTER — Inpatient Hospital Stay
Admission: EM | Admit: 2022-07-21 | Discharge: 2022-07-27 | DRG: 682 | Disposition: A | Discharge status: Home / Self Care | Payer: No Typology Code available for payment source | Attending: Internal Medicine | Admitting: Internal Medicine

## 2022-07-21 ENCOUNTER — Other Ambulatory Visit: Payer: Self-pay

## 2022-07-21 DIAGNOSIS — F0393 Unspecified dementia, unspecified severity, with mood disturbance: Secondary | ICD-10-CM | POA: Insufficient documentation

## 2022-07-21 DIAGNOSIS — E785 Hyperlipidemia, unspecified: Secondary | ICD-10-CM | POA: Insufficient documentation

## 2022-07-21 DIAGNOSIS — Z6820 Body mass index (BMI) 20.0-20.9, adult: Secondary | ICD-10-CM

## 2022-07-21 DIAGNOSIS — R7881 Bacteremia: Secondary | ICD-10-CM | POA: Diagnosis present

## 2022-07-21 DIAGNOSIS — Z87891 Personal history of nicotine dependence: Secondary | ICD-10-CM

## 2022-07-21 DIAGNOSIS — K651 Peritoneal abscess: Secondary | ICD-10-CM

## 2022-07-21 DIAGNOSIS — N179 Acute kidney failure, unspecified: Principal | ICD-10-CM | POA: Diagnosis present

## 2022-07-21 DIAGNOSIS — D638 Anemia in other chronic diseases classified elsewhere: Secondary | ICD-10-CM | POA: Insufficient documentation

## 2022-07-21 DIAGNOSIS — F419 Anxiety disorder, unspecified: Secondary | ICD-10-CM | POA: Diagnosis present

## 2022-07-21 DIAGNOSIS — N189 Chronic kidney disease, unspecified: Secondary | ICD-10-CM | POA: Diagnosis present

## 2022-07-21 DIAGNOSIS — E43 Unspecified severe protein-calorie malnutrition: Secondary | ICD-10-CM | POA: Insufficient documentation

## 2022-07-21 DIAGNOSIS — Z79899 Other long term (current) drug therapy: Secondary | ICD-10-CM

## 2022-07-21 DIAGNOSIS — N400 Enlarged prostate without lower urinary tract symptoms: Secondary | ICD-10-CM | POA: Insufficient documentation

## 2022-07-21 DIAGNOSIS — G3183 Dementia with Lewy bodies: Secondary | ICD-10-CM | POA: Diagnosis present

## 2022-07-21 DIAGNOSIS — E861 Hypovolemia: Secondary | ICD-10-CM | POA: Diagnosis present

## 2022-07-21 DIAGNOSIS — R34 Anuria and oliguria: Secondary | ICD-10-CM

## 2022-07-21 DIAGNOSIS — Z96642 Presence of left artificial hip joint: Secondary | ICD-10-CM | POA: Diagnosis present

## 2022-07-21 DIAGNOSIS — B952 Enterococcus as the cause of diseases classified elsewhere: Secondary | ICD-10-CM | POA: Diagnosis present

## 2022-07-21 DIAGNOSIS — Z885 Allergy status to narcotic agent status: Secondary | ICD-10-CM

## 2022-07-21 DIAGNOSIS — L89152 Pressure ulcer of sacral region, stage 2: Secondary | ICD-10-CM

## 2022-07-21 DIAGNOSIS — I129 Hypertensive chronic kidney disease with stage 1 through stage 4 chronic kidney disease, or unspecified chronic kidney disease: Secondary | ICD-10-CM | POA: Diagnosis present

## 2022-07-21 DIAGNOSIS — I9589 Other hypotension: Secondary | ICD-10-CM | POA: Diagnosis present

## 2022-07-21 DIAGNOSIS — Z1621 Resistance to vancomycin: Secondary | ICD-10-CM | POA: Diagnosis present

## 2022-07-21 DIAGNOSIS — Z1152 Encounter for screening for COVID-19: Secondary | ICD-10-CM

## 2022-07-21 DIAGNOSIS — N390 Urinary tract infection, site not specified: Secondary | ICD-10-CM | POA: Diagnosis present

## 2022-07-21 DIAGNOSIS — F0283 Dementia in other diseases classified elsewhere, unspecified severity, with mood disturbance: Secondary | ICD-10-CM | POA: Diagnosis present

## 2022-07-21 DIAGNOSIS — I959 Hypotension, unspecified: Principal | ICD-10-CM | POA: Diagnosis present

## 2022-07-21 LAB — RESP PANEL BY RT-PCR (FLU A&B, COVID) ARPGX2
Influenza A by PCR: NEGATIVE
Influenza B by PCR: NEGATIVE
SARS Coronavirus 2 by RT PCR: NEGATIVE

## 2022-07-21 LAB — CBC WITH DIFFERENTIAL/PLATELET
Abs Immature Granulocytes: 0.01 10*3/uL (ref 0.00–0.07)
Basophils Absolute: 0 10*3/uL (ref 0.0–0.1)
Basophils Relative: 0 %
Eosinophils Absolute: 0.2 10*3/uL (ref 0.0–0.5)
Eosinophils Relative: 3 %
HCT: 26.3 % — ABNORMAL LOW (ref 39.0–52.0)
Hemoglobin: 8.3 g/dL — ABNORMAL LOW (ref 13.0–17.0)
Immature Granulocytes: 0 %
Lymphocytes Relative: 20 %
Lymphs Abs: 1.3 10*3/uL (ref 0.7–4.0)
MCH: 27.9 pg (ref 26.0–34.0)
MCHC: 31.6 g/dL (ref 30.0–36.0)
MCV: 88.3 fL (ref 80.0–100.0)
Monocytes Absolute: 0.7 10*3/uL (ref 0.1–1.0)
Monocytes Relative: 10 %
Neutro Abs: 4.5 10*3/uL (ref 1.7–7.7)
Neutrophils Relative %: 67 %
Platelets: 302 10*3/uL (ref 150–400)
RBC: 2.98 MIL/uL — ABNORMAL LOW (ref 4.22–5.81)
RDW: 15 % (ref 11.5–15.5)
WBC: 6.7 10*3/uL (ref 4.0–10.5)
nRBC: 0 % (ref 0.0–0.2)

## 2022-07-21 LAB — MAGNESIUM: Magnesium: 2 mg/dL (ref 1.7–2.4)

## 2022-07-21 LAB — BASIC METABOLIC PANEL
Anion gap: 5 (ref 5–15)
BUN: 19 mg/dL (ref 8–23)
CO2: 24 mmol/L (ref 22–32)
Calcium: 9.8 mg/dL (ref 8.9–10.3)
Chloride: 117 mmol/L — ABNORMAL HIGH (ref 98–111)
Creatinine, Ser: 1.47 mg/dL — ABNORMAL HIGH (ref 0.61–1.24)
GFR, Estimated: 49 mL/min — ABNORMAL LOW (ref >60)
Glucose, Bld: 87 mg/dL (ref 70–99)
Potassium: 3.7 mmol/L (ref 3.5–5.1)
Sodium: 146 mmol/L — ABNORMAL HIGH (ref 135–145)

## 2022-07-21 LAB — URINALYSIS, ROUTINE W REFLEX MICROSCOPIC
Bilirubin Urine: NEGATIVE
Glucose, UA: NEGATIVE mg/dL
Ketones, ur: NEGATIVE mg/dL
Leukocytes,Ua: NEGATIVE
Nitrite: NEGATIVE
Protein, ur: 30 mg/dL — AB
RBC / HPF: >50 RBC/hpf — ABNORMAL HIGH (ref 0–5)
Specific Gravity, Urine: 1.012 (ref 1.005–1.030)
WBC, UA: NONE SEEN WBC/hpf (ref 0–5)
pH: 5 (ref 5.0–8.0)

## 2022-07-21 LAB — CBG MONITORING, ED: Glucose-Capillary: 71 mg/dL (ref 70–99)

## 2022-07-21 MED ORDER — LACTATED RINGERS IV BOLUS (SEPSIS)
500.0000 mL | Freq: Once | INTRAVENOUS | Status: AC
  Administered 2022-07-22: 500 mL via INTRAVENOUS

## 2022-07-21 MED ORDER — LACTATED RINGERS IV BOLUS (SEPSIS)
1000.0000 mL | Freq: Once | INTRAVENOUS | Status: AC
  Administered 2022-07-21: 1000 mL via INTRAVENOUS

## 2022-07-21 MED ORDER — LACTATED RINGERS IV SOLN: INTRAVENOUS | Status: DC

## 2022-07-21 MED ORDER — SODIUM CHLORIDE 0.9 % IV BOLUS
1000.0000 mL | Freq: Once | INTRAVENOUS | Status: AC
  Administered 2022-07-21: 1000 mL via INTRAVENOUS

## 2022-07-21 MED ORDER — LACTATED RINGERS IV BOLUS (SEPSIS)
250.0000 mL | Freq: Once | INTRAVENOUS | Status: AC
  Administered 2022-07-22: 250 mL via INTRAVENOUS

## 2022-07-21 MED ORDER — SODIUM CHLORIDE 0.9 % IV SOLN
2.0000 g | Freq: Once | INTRAVENOUS | Status: AC
  Administered 2022-07-21: 2 g via INTRAVENOUS
  Filled 2022-07-21: qty 12.5

## 2022-07-21 NOTE — ED Provider Triage Note (Signed)
Emergency Medicine Provider Triage Evaluation Note  Carl Figueroa , a 76 y.o. male  was evaluated in triage.  Pt complains of decreased urinary output and pain in area of pressure ulcer on back/buttocks per EMS. Patient states it "hurts to pee.".  Physical Exam  SpO2 94%  Gen:   Awake, no distress   Resp:  Normal effort  MSK:   Moves extremities without difficulty  Other:    Medical Decision Making  Medically screening exam initiated at 6:28 PM.  Appropriate orders placed.  Carl Figueroa was informed that the remainder of the evaluation will be completed by another provider, this initial triage assessment does not replace that evaluation, and the importance of remaining in the ED until their evaluation is complete.  VSS. Afebrile.    Chinita Pester, FNP 07/21/22 1831

## 2022-07-21 NOTE — ED Notes (Signed)
Pt's wife is feeding him a Technical sales engineer of yogurt and an ensure.

## 2022-07-21 NOTE — Progress Notes (Signed)
CODE SEPSIS - PHARMACY COMMUNICATION  **Broad Spectrum Antibiotics should be administered within 1 hour of Sepsis diagnosis**  Time Code Sepsis Called/Page Received: 2250  Antibiotics Ordered: Cefepime  Time of 1st antibiotic administration: 2337  Otelia Sergeant, PharmD, [facility] 07/21/2022 10:52 PM

## 2022-07-21 NOTE — ED Provider Notes (Signed)
[facility] Provider Note    Event Date/Time   First MD Initiated Contact with Patient 07/21/22 2016     (approximate)   History   Urinary Retention   HPI  Carl Figueroa is a 76 y.o. male past medical history significant for hypertension, dementia, CKD, colectomy with ileostomy placement, Foley catheter, who presents to the emergency department with decreased urine output.  History is provided by the patient's wife.  States that today has had decreased urine output and only emptied the Foley catheter this morning.  States that she normally empties approximately 500 cc in the morning and at bedtime.  Was concerned that he was having obstruction.  Decreased oral intake over the past couple of days.  Denies any fever or chills.  No falls or trauma.  Confusion at baseline and is at his mental status baseline.  No significant chest pain.  No cough.     Physical Exam   Triage Vital Signs: ED Triage Vitals  Enc Vitals Group     BP 07/21/22 1828 104/60     Pulse Rate 07/21/22 1828 82     Resp 07/21/22 1828 16     Temp 07/21/22 1828 98.3 F (36.8 C)     Temp Source 07/21/22 1828 Oral     SpO2 07/21/22 1820 94 %     Weight --      Height --      Head Circumference --      Peak Flow --      Pain Score 07/21/22 1829 7     Pain Loc --      Pain Edu? --      Excl. in [place]? --     Most recent vital signs: Vitals:   07/21/22 2230 07/21/22 2300  BP: (!) 92/57 90/65  Pulse: 84 86  Resp: 18 18  Temp:    SpO2: 98% 100%    Physical Exam Constitutional:      Appearance: He is well-developed. He is ill-appearing.     Comments: Thin male  HENT:     Head: Atraumatic.  Eyes:     Conjunctiva/sclera: Conjunctivae normal.  Cardiovascular:     Rate and Rhythm: Regular rhythm.  Pulmonary:     Effort: No respiratory distress.  Abdominal:     Comments: Ileostomy with brown stool  Genitourinary:    Comments: Foley catheter in place with leg  bag Musculoskeletal:        General: Normal range of motion.     Cervical back: Normal range of motion.  Skin:    General: Skin is warm.  Neurological:     Mental Status: He is alert. Mental status is at baseline. He is disoriented.  Psychiatric:        Mood and Affect: Mood normal.          IMPRESSION / MDM / [place] / ED COURSE  I reviewed the triage vital signs and the nursing notes.  Differential diagnosis including dehydration, sepsis, urinary tract infection, electrolyte abnormality  Patient treated with 1 L normal saline  On reevaluation notified by the patient's nurse that the patient now had a low blood pressure.  On chart review of outside records patient has a history of hypertension.  Given low blood pressure concern for possible sepsis or dehydration.  Blood cultures obtained.  Patient given 30 cc/kg of IV fluids.  Started on IV cefepime for possible complicated urinary tract infection.  RADIOLOGY I independently reviewed  imaging, my interpretation of imaging: Chest x-ray showed no signs of pneumonia      ED Results / Procedures / Treatments   Labs (all labs ordered are listed, but only abnormal results are displayed) Labs interpreted as -  Lab work with mild leukocytosis.  UA without signs of urinary tract infection.  Acute kidney injury  Labs Reviewed  BASIC METABOLIC PANEL - Abnormal; Notable for the following components:      Result Value   Sodium 146 (*)    Chloride 117 (*)    Creatinine, Ser 1.47 (*)    GFR, Estimated 49 (*)    All other components within normal limits  CBC WITH DIFFERENTIAL/PLATELET - Abnormal; Notable for the following components:   RBC 2.98 (*)    Hemoglobin 8.3 (*)    HCT 26.3 (*)    All other components within normal limits  URINALYSIS, ROUTINE W REFLEX MICROSCOPIC - Abnormal; Notable for the following components:   Color, Urine YELLOW (*)    APPearance CLOUDY (*)    Hgb urine dipstick LARGE (*)    Protein,  ur 30 (*)    RBC / HPF >50 (*)    Bacteria, UA RARE (*)    All other components within normal limits  RESP PANEL BY RT-PCR (FLU A&B, COVID) [id number]  CULTURE, BLOOD (ROUTINE X 2)  CULTURE, BLOOD (ROUTINE X 2)  URINE CULTURE  MAGNESIUM  LACTIC ACID, PLASMA  LACTIC ACID, PLASMA  CBG MONITORING, ED   12:16 AM  On sepsis reevaluation continues to appear volume depleted started on IV maintenance fluid.  After consultation with the hospitalist recommended starting p.o. midodrine 100 mg.  Patient mended to the hospitalist for further management of hypotension secondary to dehydration    PROCEDURES:  Critical Care performed: Yes  .Critical Care  Performed by: Corena Herter, MD Authorized by: Corena Herter, MD   Critical care provider statement:    Critical care time (minutes):  45   Critical care time was exclusive of:  Separately billable procedures and treating other patients   Critical care was necessary to treat or prevent imminent or life-threatening deterioration of the following conditions:  Dehydration   Critical care was time spent personally by me on the following activities:  Development of treatment plan with patient or surrogate, discussions with consultants, evaluation of patient's response to treatment, examination of patient, ordering and review of laboratory studies, ordering and review of radiographic studies, ordering and performing treatments and interventions, pulse oximetry, re-evaluation of patient's condition and review of old charts   Patient's presentation is most consistent with acute presentation with potential threat to life or bodily function.   MEDICATIONS ORDERED IN ED: Medications  lactated ringers infusion (has no administration in time range)  lactated ringers bolus 1,000 mL (1,000 mLs Intravenous New Bag/Given 07/21/22 2257)    And  lactated ringers bolus 500 mL (has no administration in time range)    And  lactated ringers bolus 250 mL (has no  administration in time range)  lactated ringers infusion (has no administration in time range)  midodrine (PROAMATINE) tablet 10 mg (has no administration in time range)  sodium chloride 0.9 % bolus 1,000 mL (0 mLs Intravenous Stopped 07/21/22 2121)  ceFEPIme (MAXIPIME) 2 g in sodium chloride 0.9 % 100 mL IVPB (2 g Intravenous New Bag/Given 07/21/22 2337)    FINAL CLINICAL IMPRESSION(S) / ED DIAGNOSES   Final diagnoses:  Hypotension, unspecified hypotension type  Decreased urine output     Rx /  DC Orders   ED Discharge Orders     None        Note:  This document was prepared using Dragon voice recognition software and may include unintentional dictation errors.   Nathaniel Man, MD 07/22/22 [phone]

## 2022-07-21 NOTE — Progress Notes (Signed)
Pt being followed by [facility] for Sepsis protocol. 

## 2022-07-21 NOTE — ED Notes (Signed)
Pt brought to ED rm 10 at this time. This RN now assuming care. 

## 2022-07-21 NOTE — ED Notes (Signed)
Pt stated it was the 1980's and that he was "someplace where people have fun" pt was able to follow commands.

## 2022-07-21 NOTE — ED Notes (Signed)
ED Provider at bedside. 

## 2022-07-21 NOTE — ED Notes (Signed)
Lab sts they have the blood and will receive it.

## 2022-07-21 NOTE — ED Triage Notes (Signed)
Pt presents to the ED via [facility] due to urinary retention. Pt had a catheter placed during his last stay due to large prostate. Pt's wife states he has had low urine output. Pt has hx PD. Pt's only complaint is his pressure ulcer on his bottom. Pt A&Ox4

## 2022-07-21 NOTE — ED Notes (Signed)
Lab present to draw labs at this time. Pt c/o feeling dizzy. Repeat VS obtained and repeat CBG obtained at this time.

## 2022-07-22 DIAGNOSIS — F0393 Unspecified dementia, unspecified severity, with mood disturbance: Secondary | ICD-10-CM | POA: Diagnosis not present

## 2022-07-22 DIAGNOSIS — I9589 Other hypotension: Secondary | ICD-10-CM | POA: Diagnosis not present

## 2022-07-22 DIAGNOSIS — E861 Hypovolemia: Secondary | ICD-10-CM

## 2022-07-22 DIAGNOSIS — N390 Urinary tract infection, site not specified: Secondary | ICD-10-CM | POA: Diagnosis not present

## 2022-07-22 DIAGNOSIS — N400 Enlarged prostate without lower urinary tract symptoms: Secondary | ICD-10-CM | POA: Insufficient documentation

## 2022-07-22 DIAGNOSIS — N179 Acute kidney failure, unspecified: Secondary | ICD-10-CM | POA: Diagnosis not present

## 2022-07-22 DIAGNOSIS — E785 Hyperlipidemia, unspecified: Secondary | ICD-10-CM | POA: Insufficient documentation

## 2022-07-22 DIAGNOSIS — L89152 Pressure ulcer of sacral region, stage 2: Secondary | ICD-10-CM

## 2022-07-22 LAB — BLOOD CULTURE ID PANEL (REFLEXED) - BCID2
A.calcoaceticus-baumannii: NOT DETECTED
Bacteroides fragilis: NOT DETECTED
Candida albicans: NOT DETECTED
Candida auris: NOT DETECTED
Candida glabrata: NOT DETECTED
Candida krusei: NOT DETECTED
Candida parapsilosis: NOT DETECTED
Candida tropicalis: NOT DETECTED
Cryptococcus neoformans/gattii: NOT DETECTED
Enterobacter cloacae complex: NOT DETECTED
Enterobacterales: NOT DETECTED
Enterococcus Faecium: DETECTED — AB
Enterococcus faecalis: NOT DETECTED
Escherichia coli: NOT DETECTED
Haemophilus influenzae: NOT DETECTED
Klebsiella aerogenes: NOT DETECTED
Klebsiella oxytoca: NOT DETECTED
Klebsiella pneumoniae: NOT DETECTED
Listeria monocytogenes: NOT DETECTED
Neisseria meningitidis: NOT DETECTED
Proteus species: NOT DETECTED
Pseudomonas aeruginosa: NOT DETECTED
Salmonella species: NOT DETECTED
Serratia marcescens: NOT DETECTED
Staphylococcus aureus (BCID): NOT DETECTED
Staphylococcus epidermidis: NOT DETECTED
Staphylococcus lugdunensis: NOT DETECTED
Staphylococcus species: NOT DETECTED
Stenotrophomonas maltophilia: NOT DETECTED
Streptococcus agalactiae: NOT DETECTED
Streptococcus pneumoniae: NOT DETECTED
Streptococcus pyogenes: NOT DETECTED
Streptococcus species: NOT DETECTED
Vancomycin resistance: DETECTED — AB

## 2022-07-22 LAB — BASIC METABOLIC PANEL
Anion gap: 2 — ABNORMAL LOW (ref 5–15)
BUN: 16 mg/dL (ref 8–23)
CO2: 22 mmol/L (ref 22–32)
Calcium: 9.5 mg/dL (ref 8.9–10.3)
Chloride: 120 mmol/L — ABNORMAL HIGH (ref 98–111)
Creatinine, Ser: 1.17 mg/dL (ref 0.61–1.24)
GFR, Estimated: >60 mL/min (ref >60)
Glucose, Bld: 85 mg/dL (ref 70–99)
Potassium: 3.6 mmol/L (ref 3.5–5.1)
Sodium: 144 mmol/L (ref 135–145)

## 2022-07-22 LAB — CBC
HCT: 23.5 % — ABNORMAL LOW (ref 39.0–52.0)
Hemoglobin: 7.7 g/dL — ABNORMAL LOW (ref 13.0–17.0)
MCH: 28 pg (ref 26.0–34.0)
MCHC: 32.8 g/dL (ref 30.0–36.0)
MCV: 85.5 fL (ref 80.0–100.0)
Platelets: 250 10*3/uL (ref 150–400)
RBC: 2.75 MIL/uL — ABNORMAL LOW (ref 4.22–5.81)
RDW: 15.4 % (ref 11.5–15.5)
WBC: 6.6 10*3/uL (ref 4.0–10.5)
nRBC: 0 % (ref 0.0–0.2)

## 2022-07-22 LAB — IRON AND TIBC
Iron: 39 ug/dL — ABNORMAL LOW (ref 45–182)
Saturation Ratios: 21 % (ref 17.9–39.5)
TIBC: 185 ug/dL — ABNORMAL LOW (ref 250–450)
UIBC: 146 ug/dL

## 2022-07-22 LAB — CBG MONITORING, ED
Glucose-Capillary: 99 mg/dL (ref 70–99)
Glucose-Capillary: 108 mg/dL — ABNORMAL HIGH (ref 70–99)

## 2022-07-22 LAB — PROCALCITONIN: Procalcitonin: <0.1 ng/mL

## 2022-07-22 LAB — VITAMIN B12: Vitamin B-12: 610 pg/mL (ref 180–914)

## 2022-07-22 LAB — LACTIC ACID, PLASMA: Lactic Acid, Venous: 1.8 mmol/L (ref 0.5–1.9)

## 2022-07-22 LAB — FOLATE: Folate: 14.3 ng/mL (ref >5.9)

## 2022-07-22 LAB — FERRITIN: Ferritin: 211 ng/mL (ref 24–336)

## 2022-07-22 MED ORDER — ACETAMINOPHEN 325 MG PO TABS
650.0000 mg | ORAL_TABLET | Freq: Four times a day (QID) | ORAL | Status: DC | PRN
  Administered 2022-07-24 – 2022-07-26 (×3): 650 mg via ORAL
  Filled 2022-07-22 (×3): qty 2

## 2022-07-22 MED ORDER — SODIUM CHLORIDE 0.9 % IV SOLN: INTRAVENOUS | Status: DC

## 2022-07-22 MED ORDER — POTASSIUM CHLORIDE 20 MEQ PO PACK
40.0000 meq | PACK | Freq: Once | ORAL | Status: AC
  Administered 2022-07-22: 40 meq via ORAL
  Filled 2022-07-22: qty 2

## 2022-07-22 MED ORDER — ACETAMINOPHEN 650 MG RE SUPP: 650.0000 mg | Freq: Four times a day (QID) | RECTAL | Status: DC | PRN

## 2022-07-22 MED ORDER — QUETIAPINE FUMARATE 25 MG PO TABS: 25.0000 mg | ORAL_TABLET | Freq: Every day | ORAL | Status: DC

## 2022-07-22 MED ORDER — TRAZODONE HCL 50 MG PO TABS: 25.0000 mg | ORAL_TABLET | Freq: Every evening | ORAL | Status: DC | PRN

## 2022-07-22 MED ORDER — LACTATED RINGERS IV SOLN: INTRAVENOUS | Status: DC

## 2022-07-22 MED ORDER — SODIUM CHLORIDE 0.9 % IV SOLN
1.0000 g | INTRAVENOUS | Status: DC
  Filled 2022-07-22: qty 10

## 2022-07-22 MED ORDER — FINASTERIDE 5 MG PO TABS
5.0000 mg | ORAL_TABLET | Freq: Every day | ORAL | Status: DC
  Administered 2022-07-22 – 2022-07-27 (×4): 5 mg via ORAL
  Filled 2022-07-22 (×5): qty 1

## 2022-07-22 MED ORDER — ONDANSETRON HCL 4 MG PO TABS: 4.0000 mg | ORAL_TABLET | Freq: Four times a day (QID) | ORAL | Status: DC | PRN

## 2022-07-22 MED ORDER — ONDANSETRON HCL 4 MG/2ML IJ SOLN: 4.0000 mg | Freq: Four times a day (QID) | INTRAMUSCULAR | Status: DC | PRN

## 2022-07-22 MED ORDER — MAGNESIUM HYDROXIDE 400 MG/5ML PO SUSP: 30.0000 mL | Freq: Every day | ORAL | Status: DC | PRN

## 2022-07-22 MED ORDER — ENOXAPARIN SODIUM 40 MG/0.4ML IJ SOSY
40.0000 mg | PREFILLED_SYRINGE | INTRAMUSCULAR | Status: DC
  Administered 2022-07-22 – 2022-07-24 (×3): 40 mg via SUBCUTANEOUS
  Filled 2022-07-22 (×4): qty 0.4

## 2022-07-22 MED ORDER — TAMSULOSIN HCL 0.4 MG PO CAPS
0.4000 mg | ORAL_CAPSULE | Freq: Every day | ORAL | Status: DC
  Administered 2022-07-22 – 2022-07-27 (×4): 0.4 mg via ORAL
  Filled 2022-07-22 (×4): qty 1

## 2022-07-22 MED ORDER — MIDODRINE HCL 5 MG PO TABS
10.0000 mg | ORAL_TABLET | Freq: Once | ORAL | Status: AC
  Administered 2022-07-22: 10 mg via ORAL
  Filled 2022-07-22: qty 2

## 2022-07-22 MED ORDER — SODIUM CHLORIDE 0.9 % IV SOLN
450.0000 mg | Freq: Once | INTRAVENOUS | Status: AC
  Administered 2022-07-22: 450 mg via INTRAVENOUS
  Filled 2022-07-22: qty 9

## 2022-07-22 MED ORDER — SERTRALINE HCL 50 MG PO TABS
25.0000 mg | ORAL_TABLET | Freq: Every day | ORAL | Status: DC
  Administered 2022-07-22 – 2022-07-27 (×4): 25 mg via ORAL
  Filled 2022-07-22 (×4): qty 1

## 2022-07-22 NOTE — Assessment & Plan Note (Signed)
-   We will continue Exelon and Aricept, Zoloft, Effexor XR and Seroquel as well as Klonopin.

## 2022-07-22 NOTE — Assessment & Plan Note (Signed)
Foley catheter changed in the emergency room on 07/21/2022.  Continue chronic Foley catheter management.  Continue Flomax and Proscar.

## 2022-07-22 NOTE — Assessment & Plan Note (Signed)
-   We will continue with IV Rocephin and follow urine culture.

## 2022-07-22 NOTE — Assessment & Plan Note (Signed)
Resolved

## 2022-07-22 NOTE — H&P (Signed)
[place]   PATIENT NAME: Carl Figueroa    MR#:  [id number]  DATE OF BIRTH:  06/20/1946  DATE OF ADMISSION:  07/21/2022  PRIMARY CARE PHYSICIAN: Leonel Ramsay, MD   Patient is coming from: Home  REQUESTING/REFERRING PHYSICIAN: Nathaniel Man, MD  CHIEF COMPLAINT:   Chief Complaint  Patient presents with   Urinary Retention    HISTORY OF PRESENT ILLNESS:  Carl Figueroa is a 76 y.o. male with medical history significant for essential hypertension, anxiety and Lewy body dementia as well as osteoarthritis, who presented to the emergency room with acute onset of hypotension and diminished urinary output.  He admits to urinary frequency and dysuria without hematuria or flank pain.  The patient had a recent hospitalization for appendectomy that was complicated and he underwent colostomy.  He denies any headache or dizziness or blurred vision.  No nausea or vomiting or abdominal pain.  No chest pain or dyspnea or palpitations.  No cough or wheezing.  ED Course: When he came to the ER initial blood pressure was 104/60 with normal vital signs.  Later on BP was 90/65 and later 113/42.  Labs revealed mild hyponatremia and hyperkalemia and creatinine of 1.47 up from 1.14 on 11/21. UA was positive for UTI.  CBC showed anemia with hemoglobin 8.3 and hematocrit 26.3 close to baseline.  COVID-19 PCR came back negative influenza antigens were negative.  Imaging: Portable chest x-ray showed no acute cardiopulmonary disease.  The patient was given IV cefepime 1.75 L bolus of IV lactated Ringer followed by 150 mill per hour as well as 1 L bolus of IV normal saline and 10 mg of p.o. midodrine.  He will be admitted to a progressive unit bed for further evaluation and management. 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   Procedure: TOTAL HIP ARTHROPLASTY ANTERIOR APPROACH;  Surgeon: Hessie Knows, MD;  Location: [facility] ORS;  Service: Orthopedics;  Laterality: Left;    SOCIAL HISTORY:   Social History   Tobacco Use   Smoking status: Former    Packs/day: 0.25    Years: 20.00    Total pack years: 5.00    Types: Cigarettes    Quit date: 10/28/2019    Years since quitting: 2.7   Smokeless tobacco: Never  Substance Use Topics   Alcohol use: No    FAMILY HISTORY:   Family History  Problem Relation Age of Onset   Cancer Mother    Diabetes Mellitus II Neg Hx     DRUG ALLERGIES:   Allergies  Allergen Reactions   Morphine Nausea Only    REVIEW OF SYSTEMS:   ROS As per history of present illness. All pertinent systems were reviewed above. Constitutional, HEENT, cardiovascular, respiratory, GI, GU, musculoskeletal, neuro, psychiatric, endocrine, integumentary and hematologic systems were reviewed and are otherwise negative/unremarkable except for positive findings mentioned above in the HPI.   MEDICATIONS AT HOME:   Prior to Admission medications   Medication Sig Start Date  End Date Taking? Authorizing Provider  finasteride (PROSCAR) 5 MG tablet Take 5 mg by mouth daily. 07/06/22 08/05/22 Yes [provider]  QUEtiapine (SEROQUEL) 25 MG tablet Take 1 tablet (25 mg total) by mouth at bedtime. 07/17/22  Yes Shelly Coss, MD  sertraline (ZOLOFT) 25 MG tablet Take 25 mg by mouth daily.   Yes [provider]  tamsulosin (FLOMAX) 0.4 MG CAPS capsule Take 0.4 mg by mouth daily. 07/06/22 08/05/22 Yes [provider]  furosemide (LASIX) 40 MG tablet Take 1 tablet (40 mg total) by mouth daily as needed. As needed for leg edema 05/09/22 06/09/23  Furth, Cadence H, PA-C       VITAL SIGNS:  Blood pressure (!) 128/58, pulse 64, temperature 98.3 F (36.8 C), temperature source Oral, resp. rate 16, SpO2 98 %.  PHYSICAL EXAMINATION:  Physical Exam  GENERAL:  76 y.o.-year-old patient lying in the bed with no acute distress.  EYES: Pupils equal, round, reactive to light and accommodation. No scleral icterus. Extraocular muscles intact.  HEENT: Head atraumatic, normocephalic. Oropharynx with dry mucous membrane and tongue and nasopharynx clear.  NECK:  Supple, no jugular venous distention. No thyroid enlargement, no tenderness.  LUNGS: Normal breath sounds bilaterally, no wheezing, rales,rhonchi or crepitation. No use of accessory muscles of respiration.  CARDIOVASCULAR: Regular rate and rhythm, S1, S2 normal.  2/6 systolic ejection murmur at the left lower sternal border with no gallops or rubs.  Rubs, or gallops.  ABDOMEN: Soft, nondistended, nontender. Bowel sounds present. No organomegaly or mass.  EXTREMITIES: No pedal edema, cyanosis, or clubbing.  Colostomy in place in the right lower quadrant with brown stools with no melena or bright red blood. NEUROLOGIC: Cranial nerves II through XII are intact. Muscle strength 5/5 in all extremities. Sensation intact. Gait not checked.  PSYCHIATRIC: The patient is alert and oriented x 3.  Normal affect and good eye contact. SKIN: Stage II sacral decubitus ulcer/healing skin tear.   LABORATORY PANEL:   CBC Recent Labs  Lab 07/21/22 2001  WBC 6.7  HGB 8.3*  HCT 26.3*  PLT 302   ------------------------------------------------------------------------------------------------------------------  Chemistries  Recent Labs  Lab 07/21/22 2001  NA 146*  K 3.7  CL 117*  CO2 24  GLUCOSE 87  BUN 19  CREATININE 1.47*  CALCIUM 9.8  MG 2.0   ------------------------------------------------------------------------------------------------------------------  Cardiac Enzymes No results for input(s): "TROPONINI" in the  last 168 hours. ------------------------------------------------------------------------------------------------------------------  RADIOLOGY:  DG Chest Port 1 View  Result Date: 07/21/2022 CLINICAL DATA:  Sepsis EXAM: PORTABLE CHEST 1 VIEW COMPARISON:  07/07/2022 FINDINGS: The heart size and mediastinal contours are within normal limits. Both lungs are clear. The visualized skeletal structures are unremarkable. IMPRESSION: No active disease. Electronically Signed   By: Ulyses Jarred M.D.   On: 07/21/2022 23:07      IMPRESSION AND PLAN:  Assessment and Plan: * AKI (acute kidney injury) ([place]) - This likely hypovolemic and due to hypotension. - The patient be admitted to a progressive unit bed. - We will continue hydration with IV normal saline and avoid nephrotoxins. - We will follow his BMP. - We will optimize his blood pressure.  Hypotension - He will be aggressively hydrated and will follow BP which is currently improved.  Acute lower UTI - We will continue with IV Rocephin and follow urine culture.  Sacral decubitus ulcer, stage II (HCC) - It seems to be clean. - Wound care consult will be obtained.  BPH (benign prostatic hyperplasia) - We will continue  Flomax.  Dementia, senile with depression (HCC) - We will continue Exelon and Aricept, Zoloft, Effexor XR and Seroquel as well as Klonopin.  Dyslipidemia - We will continue statin therapy.   DVT prophylaxis: Lovenox.  Advanced Care Planning:  Code Status: full code.  Family Communication:  The plan of care was discussed in details with the patient (and family). I answered all questions. The patient agreed to proceed with the above mentioned plan. Further management will depend upon hospital course. Disposition Plan: Back to previous home environment Consults called: none.  All the records are reviewed and case discussed with ED provider.  Status is: Inpatient  At the time of the admission, it appears that the  appropriate admission status for this patient is inpatient.  This is judged to be reasonable and necessary in order to provide the required intensity of service to ensure the patient's safety given the presenting symptoms, physical exam findings and initial radiographic and laboratory data in the context of comorbid conditions.  The patient requires inpatient status due to high intensity of service, high risk of further deterioration and high frequency of surveillance required.  I certify that at the time of admission, it is my clinical judgment that the patient will require inpatient hospital care extending more than 2 midnights.                            Dispo: The patient is from: Home              Anticipated d/c is to: Home              Patient currently is not medically stable to d/c.              Difficult to place patient: No  Hannah Beat M.D on 07/22/2022 at 2:49 AM  [facility]   From 7 PM-7 AM, contact night-coverage [email or web address]  CC: Primary care physician; Mick Sell, MD

## 2022-07-22 NOTE — Care Management CC44 (Addendum)
Condition Code 44 Documentation Completed  Patient Details  Name: Carl Figueroa MRN: [id number] Date of Birth: 05/16/1946   Condition Code 44 given:  Yes Patient signature on Condition Code 44 notice:  Yes Documentation of 2 MD's agreement:  Yes Code 44 added to claim:  Yes  Due to pt's orientation, Code 44 was done with his wife.   Carmina Miller, LCSWA 07/22/2022, 12:45 PM

## 2022-07-22 NOTE — Assessment & Plan Note (Signed)
Present on admission, see full description below

## 2022-07-22 NOTE — Assessment & Plan Note (Signed)
-   continue statin therapy. 

## 2022-07-22 NOTE — Assessment & Plan Note (Signed)
Creatinine 1.47 on presentation.  Last month's creatinine 1.04.  Today's creatinine 1.2.

## 2022-07-22 NOTE — Progress Notes (Addendum)
ID Brief Note (Chart reviewed, not seen in person, remotely covering over the weekend)  76 Y O male with PMH of anxiety, arthritis, HTN, Lewy body dementia, CKD, colectomy s/p colostomy, Left THA who presented to ED on 12/2 due to acute urinary retention as well as DU on buttocks/back/dysuria. Patient had a catheter placed last admission. Also had decreased UO over the last few days. Denied fevers, chills. Mental status at baseline  At ED, afebrile, no leukocytosis but had hypotension requiring IVF and abtx ( dehydration vs sepsis) Labs remarkable for AKI of 1.47     Latest Ref Rng & Units 07/22/2022    6:31 AM 07/21/2022    8:01 PM 07/15/2022    4:30 AM  CBC  WBC 4.0 - 10.5 K/uL 6.6  6.7  8.3   Hemoglobin 13.0 - 17.0 g/dL 7.7  8.3  8.4   Hematocrit 39.0 - 52.0 % 23.5  26.3  25.3   Platelets 150 - 400 K/uL 250  302  174       Latest Ref Rng & Units 07/22/2022    6:31 AM 07/21/2022    8:01 PM 07/15/2022    4:30 AM  CMP  Glucose 70 - 99 mg/dL 85  87  93   BUN 8 - 23 mg/dL 16  19  12    Creatinine 0.61 - 1.24 mg/dL  6.64  4.03   Sodium 135 - 145 mmol/L 144  146  143   Potassium 3.5 - 5.1 mmol/L 3.6  3.7  3.6   Chloride 98 - 111 mmol/L 120  117  113   CO2 22 - 32 mmol/L 22  24  24    Calcium 8.9 - 10.3 mg/dL 9.5  9.8  4.74     Chest Xray 12/2 unremarkable 12/2 UA with no WBC but large RBC, urine cx pending  12/2 blood cx both rt arm GPC in 1/4 sets, BCID as VRE   Currently on cefepime   Buttock wound    Difficult to interpret VRE in 1/4 bottles all from rt arm in a patient with came in predominantly urinary symptoms but unremarkable UA. Possibility of translocation from urinary source vs buttock wound vs contamination  Recommendations  DC cefepime  Get 2 more sets of peripheral blood cx after which start daptomycin. If repeat blood cx negative,  do not think further work up is needed if now new concerns  D/w ID pharm D as well as Dr 14/2  Dr 14/2 back tomorrow.    Georgeann Oppenheim, MD Infectious Disease Physician [facility] [address] [place], [facility] [place] Phone: [phone]  Fax: [phone]

## 2022-07-22 NOTE — Consult Note (Signed)
PHARMACY - PHYSICIAN COMMUNICATION CRITICAL VALUE ALERT - BLOOD CULTURE IDENTIFICATION (BCID)  Carl Figueroa is an 76 y.o. male who presented to [facility] on 07/21/2022 with a chief complaint of acute urinary retention   Assessment:  1/4 anaerobic GPC in chains, enteroccocus faecalis resistance VAN A/B. Urine cultures were sent but have not resulted.  Recommend initiation of daptomycin .  Name of physician (or Provider) Contacted: Dr Georgeann Oppenheim  Current antibiotics: N/A  Changes to prescribed antibiotics recommended:  Recommendations declined by provider - infection may be polymicrobial  No results found for this or any previous visit.  Selinda Eon 07/22/2022  2:46 PM

## 2022-07-22 NOTE — ED Notes (Signed)
IV team at bedside 

## 2022-07-22 NOTE — ED Notes (Signed)
Spoke with phlebotomy about obtaining pt's morning labs.

## 2022-07-22 NOTE — Progress Notes (Signed)
Brief hospitalist update note.  This is a nonbillable note.  Please see same-day H&P for full billable details.  Briefly, this is a 76 year old male significant history of hypertension, anxiety, Lewy body dementia, osteoarthritis who presents to the ED with acute onset of hypotension associated with diminished urinary output.  I had a conversation with the patient's wife at bedside today.  She states over the last 2 to 3 days he has had decreasing level of mentation, increasing confusion, decreased oral intake and decreased urinary output.  So she brought him to the emergency room.  He does have evidence of acute kidney injury.  His urinalysis is not indicative of infection.  Admission status downgraded to observation.  Will monitor overnight.  Recheck 5 AM labs.  If laboratory workup is stable patient will be discharged home in the care of his wife.  Lolita Patella MD  No charge

## 2022-07-23 ENCOUNTER — Observation Stay: Payer: No Typology Code available for payment source

## 2022-07-23 DIAGNOSIS — R7881 Bacteremia: Secondary | ICD-10-CM | POA: Diagnosis not present

## 2022-07-23 DIAGNOSIS — Z1621 Resistance to vancomycin: Secondary | ICD-10-CM

## 2022-07-23 DIAGNOSIS — N179 Acute kidney failure, unspecified: Secondary | ICD-10-CM | POA: Diagnosis not present

## 2022-07-23 DIAGNOSIS — B952 Enterococcus as the cause of diseases classified elsewhere: Secondary | ICD-10-CM | POA: Diagnosis not present

## 2022-07-23 DIAGNOSIS — L89152 Pressure ulcer of sacral region, stage 2: Secondary | ICD-10-CM

## 2022-07-23 DIAGNOSIS — K651 Peritoneal abscess: Secondary | ICD-10-CM

## 2022-07-23 LAB — CBC WITH DIFFERENTIAL/PLATELET
Abs Immature Granulocytes: 0.01 10*3/uL (ref 0.00–0.07)
Basophils Absolute: 0 10*3/uL (ref 0.0–0.1)
Basophils Relative: 0 %
Eosinophils Absolute: 0.4 10*3/uL (ref 0.0–0.5)
Eosinophils Relative: 6 %
HCT: 25.9 % — ABNORMAL LOW (ref 39.0–52.0)
Hemoglobin: 8.1 g/dL — ABNORMAL LOW (ref 13.0–17.0)
Immature Granulocytes: 0 %
Lymphocytes Relative: 23 %
Lymphs Abs: 1.4 10*3/uL (ref 0.7–4.0)
MCH: 27.6 pg (ref 26.0–34.0)
MCHC: 31.3 g/dL (ref 30.0–36.0)
MCV: 88.4 fL (ref 80.0–100.0)
Monocytes Absolute: 0.6 10*3/uL (ref 0.1–1.0)
Monocytes Relative: 9 %
Neutro Abs: 3.8 10*3/uL (ref 1.7–7.7)
Neutrophils Relative %: 62 %
Platelets: 263 10*3/uL (ref 150–400)
RBC: 2.93 MIL/uL — ABNORMAL LOW (ref 4.22–5.81)
RDW: 15.3 % (ref 11.5–15.5)
WBC: 6.1 10*3/uL (ref 4.0–10.5)
nRBC: 0 % (ref 0.0–0.2)

## 2022-07-23 LAB — HEPATIC FUNCTION PANEL
ALT: 11 U/L (ref 0–44)
AST: 18 U/L (ref 15–41)
Albumin: 2.2 g/dL — ABNORMAL LOW (ref 3.5–5.0)
Alkaline Phosphatase: 68 U/L (ref 38–126)
Bilirubin, Direct: <0.1 mg/dL (ref 0.0–0.2)
Total Bilirubin: 0.6 mg/dL (ref 0.3–1.2)
Total Protein: 5.6 g/dL — ABNORMAL LOW (ref 6.5–8.1)

## 2022-07-23 LAB — GLUCOSE, CAPILLARY: Glucose-Capillary: 81 mg/dL (ref 70–99)

## 2022-07-23 LAB — URINE CULTURE: Culture: NO GROWTH

## 2022-07-23 LAB — BASIC METABOLIC PANEL
Anion gap: 2 — ABNORMAL LOW (ref 5–15)
BUN: 15 mg/dL (ref 8–23)
CO2: 23 mmol/L (ref 22–32)
Calcium: 9.3 mg/dL (ref 8.9–10.3)
Chloride: 117 mmol/L — ABNORMAL HIGH (ref 98–111)
Creatinine, Ser: 1.2 mg/dL (ref 0.61–1.24)
GFR, Estimated: >60 mL/min (ref >60)
Glucose, Bld: 88 mg/dL (ref 70–99)
Potassium: 3.7 mmol/L (ref 3.5–5.1)
Sodium: 142 mmol/L (ref 135–145)

## 2022-07-23 LAB — CK: Total CK: 75 U/L (ref 49–397)

## 2022-07-23 LAB — CBG MONITORING, ED
Glucose-Capillary: 69 mg/dL — ABNORMAL LOW (ref 70–99)
Glucose-Capillary: 72 mg/dL (ref 70–99)
Glucose-Capillary: 92 mg/dL (ref 70–99)
Glucose-Capillary: 83 mg/dL (ref 70–99)

## 2022-07-23 MED ORDER — LINEZOLID 600 MG/300ML IV SOLN
600.0000 mg | Freq: Two times a day (BID) | INTRAVENOUS | Status: DC
  Administered 2022-07-23 – 2022-07-24 (×3): 600 mg via INTRAVENOUS
  Filled 2022-07-23 (×4): qty 300

## 2022-07-23 MED ORDER — IOHEXOL 300 MG/ML  SOLN
100.0000 mL | Freq: Once | INTRAMUSCULAR | Status: AC | PRN
  Administered 2022-07-23: 100 mL via INTRAVENOUS

## 2022-07-23 MED ORDER — CHLORHEXIDINE GLUCONATE CLOTH 2 % EX PADS
6.0000 | MEDICATED_PAD | Freq: Every day | CUTANEOUS | Status: DC
  Administered 2022-07-24 – 2022-07-26 (×3): 6 via TOPICAL

## 2022-07-23 NOTE — ED Notes (Signed)
Informed RN bed assigned 

## 2022-07-23 NOTE — Consult Note (Addendum)
NAME: Carl Figueroa  DOB: 1945-12-12  MRN: [id number]  Date/Time: 07/23/2022 12:33 PM  REQUESTING PROVIDER: Dr.Sreenath Subjective:  REASON FOR CONSULT: VRE bacteremia  ?No hisotry from patient- chart reviewed Carl Figueroa is a 76 y.o. with a history of LT THA, Recent total colectomy with ileostomy, urinary retention with foley, lewy body dementia complicated medical history presents with decreased urinary out put   He was in [place] for a month in Oct/NOV and was discharged on Nov 17th- He had appendicitis/sigmoid volvulus and underwent colectomy and ileostomy- he had intraabdominal abscesses and had drain placed- He had enterococcus ( resistant to penicillin)  and candida in the culture from 11/8. It looks like he was dc on linezolid- He came to our ED on 07/07/22 with Altered mental status A ct abdomen revealed A pigtail catheter is noted entering the lateral right mid abdominal wall with the pigtail within a right mid abdominal small bowel segment.  a repeat Ct on 11/24 showed Right abdominal percutaneous drain within a intermediate density fluid collection along the anterior right abdominal wall tracking inferiorly to the superior aspect of the bladder and superiorly to the undersurface of the right hepatic lobe. This measures approximately 3.6 x 2.0 x 11.4 cm (series 2/image 39 and series 5/image 29). This is slightly decreased in size compared to 07/07/2022 when it measured approximately 4.2 x 2.4 x 15 cm. The gaseous component of the fluid collection has resolved. 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 He has a chronic foley . He had poor intake for 2 days- wife did not empty 500cc of urine like she would do every monring Vitals in the ED  07/21/22 18:28  BP 104/60  Temp 98.3 F (36.8 C)  Pulse Rate 82  Resp 16  SpO2 100 %    Latest Reference Range & Units 07/21/22 20:01  WBC 4.0 - 10.5 K/uL 6.7  Hemoglobin 13.0 - 17.0 g/dL 8.3 (L)  HCT 99.8 - 33.8 % 26.3 (L)  Platelets 150 - 400 K/uL 302  Creatinine 0.61 - 1.24 mg/dL 2.50 (H)  I am seeing the patient as blood cultur 1 of 4 has VRE  Past Medical History:  Diagnosis Date   Anxiety    Arthritis    Hypertension    Lewy body dementia (HCC)     Past Surgical History:  Procedure Laterality Date   COLON SURGERY     COLONOSCOPY     COLOSTOMY Right    FOOT SURGERY     X2   HERNIA REPAIR Right    INGUINAL    perforated bowel  05/2022   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   Procedure: TOTAL HIP ARTHROPLASTY ANTERIOR APPROACH;  Surgeon: Kennedy Bucker, MD;  Location: [facility] ORS;  Service: Orthopedics;  Laterality: Left;    Social History   Socioeconomic History   Marital status: Married    Spouse name: Not on file   Number of children: Not on file   Years of education: Not on file   Highest education level: Not on file  Occupational History  Not on file  Tobacco Use   Smoking status: Former    Packs/day: 0.25    Years: 20.00    Total pack years: 5.00    Types: Cigarettes    Quit date: 10/28/2019    Years since quitting: 2.7   Smokeless tobacco: Never  Vaping Use   Vaping Use: Never used  Substance and Sexual Activity   Alcohol use: No   Drug use: No   Sexual activity: Not on file  Other Topics Concern   Not on file  Social History Narrative   Not on file   Social Determinants of Health   Financial Resource Strain: Not on file  Food Insecurity: No Food Insecurity (07/08/2022)   Hunger Vital Sign    Worried About Running Out of Food in the Last Year: Never true    Ran Out of  Food in the Last Year: Never true  Transportation Needs: No Transportation Needs (07/08/2022)   PRAPARE - Administrator, Civil Service (Medical): No    Lack of Transportation (Non-Medical): No  Physical Activity: Not on file  Stress: Not on file  Social Connections: Not on file  Intimate Partner Violence: Not At Risk (07/08/2022)   Humiliation, Afraid, Rape, and Kick questionnaire    Fear of Current or Ex-Partner: No    Emotionally Abused: No    Physically Abused: No    Sexually Abused: No    Family History  Problem Relation Age of Onset   Cancer Mother    Diabetes Mellitus II Neg Hx    Allergies  Allergen Reactions   Morphine Nausea Only   I? Current Facility-Administered Medications  Medication Dose Route Frequency Provider Last Rate Last Admin   0.9 %  sodium chloride infusion   Intravenous Continuous Lolita Patella B, MD 100 mL/hr at 07/23/22 0308 Infusion Verify at 07/23/22 0308   acetaminophen (TYLENOL) tablet 650 mg  650 mg Oral Q6H PRN Mansy, Jan A, MD       Or   acetaminophen (TYLENOL) suppository 650 mg  650 mg Rectal Q6H PRN Mansy, Jan A, MD       enoxaparin (LOVENOX) injection 40 mg  40 mg Subcutaneous Q24H Mansy, Jan A, MD   40 mg at 07/23/22 0954   finasteride (PROSCAR) tablet 5 mg  5 mg Oral Daily Mansy, Jan A, MD   5 mg at 07/23/22 0955   magnesium hydroxide (MILK OF MAGNESIA) suspension 30 mL  30 mL Oral Daily PRN Mansy, Jan A, MD       ondansetron [facility]) tablet 4 mg  4 mg Oral Q6H PRN Mansy, Jan A, MD       Or   ondansetron [facility]) injection 4 mg  4 mg Intravenous Q6H PRN Mansy, Jan A, MD       sertraline (ZOLOFT) tablet 25 mg  25 mg Oral Daily Mansy, Jan A, MD   25 mg at 07/23/22 0954   tamsulosin (FLOMAX) capsule 0.4 mg  0.4 mg Oral Daily Mansy, Jan A, MD   0.4 mg at 07/23/22 0954   traZODone (DESYREL) tablet 25 mg  25 mg Oral QHS PRN Mansy, Vernetta Honey, MD       Current Outpatient Medications  Medication Sig Dispense Refill   finasteride  (PROSCAR) 5 MG tablet Take 5 mg by mouth daily.     sertraline (ZOLOFT) 25 MG tablet Take 25 mg by mouth daily.     tamsulosin (FLOMAX) 0.4 MG CAPS capsule Take 0.4 mg by  mouth daily.     furosemide (LASIX) 40 MG tablet Take 1 tablet (40 mg total) by mouth daily as needed. As needed for leg edema 30 tablet 3     Abtx:  Anti-infectives (From admission, onward)    Start     Dose/Rate Route Frequency Ordered Stop   07/22/22 1930  DAPTOmycin (CUBICIN) 450 mg in sodium chloride 0.9 % IVPB        450 mg 118 mL/hr over 30 Minutes Intravenous  Once 07/22/22 1835 07/22/22 2210   07/22/22 0800  cefTRIAXone (ROCEPHIN) 1 g in sodium chloride 0.9 % 100 mL IVPB  Status:  Discontinued        1 g 200 mL/hr over 30 Minutes Intravenous Every 24 hours 07/22/22 0113 07/22/22 0821   07/21/22 2300  ceFEPIme (MAXIPIME) 2 g in sodium chloride 0.9 % 100 mL IVPB        2 g 200 mL/hr over 30 Minutes Intravenous  Once 07/21/22 2246 07/22/22 0007       REVIEW OF SYSTEMS: poor hisotiran due to underlying dementia NA Objective:  VITALS:  BP 119/63 (BP Location: Left Arm)   Pulse 78   Temp 98.4 F (36.9 C) (Oral)   Resp 16   SpO2 98%  LDA Foley  PHYSICAL EXAM:  General: awake- oriented in person only- follows commands, emaciated Head: Normocephalic, without obvious abnormality, atraumatic. Eyes: Conjunctivae clear, anicteric sclerae. Pupils are equal ENT Nares normal. No drainage or sinus tenderness. Tongue coated Neck: symmetrical, no adenopathy, thyroid: non tender no carotid bruit and no JVD. Back: sacral decub  Lungs: b/l air entry. Heart:s1s2 Abdomen: soft- ileostomy Extremities: rt index finger dry gangrene Skin: No rashes or lesions. Or bruising Lymph: Cervical, supraclavicular normal. Neurologic: moves all extremities Pertinent Labs Lab Results CBC    Component Value Date/Time   WBC 6.1 07/23/2022 1044   RBC 2.93 (L) 07/23/2022 1044   HGB 8.1 (L) 07/23/2022 1044   HGB 13.5  08/03/2021 1043   HCT 25.9 (L) 07/23/2022 1044   HCT 41.6 08/03/2021 1043   PLT 263 07/23/2022 1044   MCV 88.4 07/23/2022 1044   MCV 87 08/03/2021 1043   MCH 27.6 07/23/2022 1044   MCHC 31.3 07/23/2022 1044   RDW 15.3 07/23/2022 1044   RDW 12.9 08/03/2021 1043   LYMPHSABS 1.4 07/23/2022 1044   LYMPHSABS 1.1 08/03/2021 1043   MONOABS 0.6 07/23/2022 1044   EOSABS 0.4 07/23/2022 1044   EOSABS 0.1 08/03/2021 1043   BASOSABS 0.0 07/23/2022 1044   BASOSABS 0.0 08/03/2021 1043       Latest Ref Rng & Units 07/23/2022   10:44 AM 07/22/2022    6:31 AM 07/21/2022    8:01 PM  CMP  Glucose 70 - 99 mg/dL 88  85  87   BUN 8 - 23 mg/dL Creatinine 0.61 - 1.24 mg/dL 1.61  0.96  0.45   Sodium 135 - 145 mmol/L 142  144  146   Potassium 3.5 - 5.1 mmol/L 3.7  3.6  3.7   Chloride 98 - 111 mmol/L 117  120  117   CO2 22 - 32 mmol/L Calcium 8.9 - 10.3 mg/dL 9.3  9.5  9.8       Microbiology: Recent Results (from the past 240 hour(s))  Urine Culture     Status: None   Collection Time: 07/21/22 10:45 PM   Specimen: In/Out Cath Urine  Result  Value Ref Range Status   Specimen Description   Final    IN/OUT CATH URINE Performed at [facility], [address] [place], [place] [address]    Special Requests   Final    NONE Performed at [facility], [address] [place], [place] [address]    Culture   Final    NO GROWTH Performed at [facility], [address], [place], [place] [address]    Report Status 07/23/2022 FINAL  Final  Resp Panel by RT-PCR (Flu A&B, Covid) Anterior Nasal Swab     Status: None   Collection Time: 07/21/22 10:52 PM   Specimen: Anterior Nasal Swab  Result Value Ref Range Status   SARS Coronavirus 2 by RT PCR NEGATIVE NEGATIVE Final    Comment: (NOTE) SARS-CoV-2 target nucleic acids are NOT DETECTED.  The SARS-CoV-2 RNA is generally detectable in upper respiratory specimens during the acute phase of infection.  The lowest concentration of SARS-CoV-2 viral copies this assay can detect is 138 copies/mL. A negative result does not preclude SARS-Cov-2 infection and should not be used as the sole basis for treatment or other patient management decisions. A negative result may occur with  improper specimen collection/handling, submission of specimen other than nasopharyngeal swab, presence of viral mutation(s) within the areas targeted by this assay, and inadequate number of viral copies(<138 copies/mL). A negative result must be combined with clinical observations, patient history, and epidemiological information. The expected result is Negative.  Fact Sheet for Patients:  BloggerCourse.com  Fact Sheet for Healthcare Providers:  SeriousBroker.it  This test is no t yet approved or cleared by the [place] FDA and  has been authorized for detection and/or diagnosis of SARS-CoV-2 by FDA under an Emergency Use Authorization (EUA). This EUA will remain  in effect (meaning this test can be used) for the duration of the COVID-19 declaration under Section 564(b)(1) of the Act, 21 U.S.C.section 360bbb-3(b)(1), unless the authorization is terminated  or revoked sooner.       Influenza A by PCR NEGATIVE NEGATIVE Final   Influenza B by PCR NEGATIVE NEGATIVE Final    Comment: (NOTE) The Xpert Xpress SARS-CoV-2/FLU/RSV plus assay is intended as an aid in the diagnosis of influenza from Nasopharyngeal swab specimens and should not be used as a sole basis for treatment. Nasal washings and aspirates are unacceptable for Xpert Xpress SARS-CoV-2/FLU/RSV testing.  Fact Sheet for Patients: BloggerCourse.com  Fact Sheet for Healthcare Providers: SeriousBroker.it  This test is not yet approved or cleared by the [place] FDA and has been authorized for detection and/or diagnosis of SARS-CoV-2 by FDA  under an Emergency Use Authorization (EUA). This EUA will remain in effect (meaning this test can be used) for the duration of the COVID-19 declaration under Section 564(b)(1) of the Act, 21 U.S.C. section 360bbb-3(b)(1), unless the authorization is terminated or revoked.  Performed at [facility], [address] [place], [place] [address]   Blood Culture (routine x 2)     Status: Abnormal (Preliminary result)   Collection Time: 07/21/22 11:31 PM   Specimen: BLOOD RIGHT ARM  Result Value Ref Range Status   Specimen Description   Final    BLOOD RIGHT ARM Performed at [facility], [address], [place], [place] [address]    Special Requests   Final    Blood Culture adequate volume Performed at [facility], [address], [place], [place] [address]    Culture  Setup Time  Final    GRAM POSITIVE COCCI ANAEROBIC BOTTLE ONLY CRITICAL RESULT CALLED TO, READ BACK BY AND VERIFIED WITH: Delight Stare 07/22/22 @ 1446 BY SB    Culture (A)  Final    ENTEROCOCCUS FAECIUM CULTURE REINCUBATED FOR BETTER GROWTH Performed at [facility], [address], [place], [place] [address]    Report Status PENDING  Incomplete  Blood Culture (routine x 2)     Status: None (Preliminary result)   Collection Time: 07/21/22 11:31 PM   Specimen: BLOOD RIGHT ARM  Result Value Ref Range Status   Specimen Description BLOOD RIGHT ARM  Final   Special Requests Blood Culture adequate volume  Final   Culture   Final    NO GROWTH 2 DAYS Performed at [facility], [address] [place], [place] [address]    Report Status PENDING  Incomplete  Blood Culture ID Panel (Reflexed)     Status: Abnormal   Collection Time: 07/21/22 11:31 PM  Result Value Ref Range Status   Enterococcus faecalis NOT DETECTED NOT DETECTED Final   Enterococcus Faecium DETECTED (A) NOT DETECTED Final    Comment: CRITICAL RESULT CALLED TO, READ BACK BY AND VERIFIED WITH: Delight Stare  07/22/22 @ 1446 BY SB    Listeria monocytogenes NOT DETECTED NOT DETECTED Final   Staphylococcus species NOT DETECTED NOT DETECTED Final   Staphylococcus aureus (BCID) NOT DETECTED NOT DETECTED Final   Staphylococcus epidermidis NOT DETECTED NOT DETECTED Final   Staphylococcus lugdunensis NOT DETECTED NOT DETECTED Final   Streptococcus species NOT DETECTED NOT DETECTED Final   Streptococcus agalactiae NOT DETECTED NOT DETECTED Final   Streptococcus pneumoniae NOT DETECTED NOT DETECTED Final   Streptococcus pyogenes NOT DETECTED NOT DETECTED Final   A.calcoaceticus-baumannii NOT DETECTED NOT DETECTED Final   Bacteroides fragilis NOT DETECTED NOT DETECTED Final   Enterobacterales NOT DETECTED NOT DETECTED Final   Enterobacter cloacae complex NOT DETECTED NOT DETECTED Final   Escherichia coli NOT DETECTED NOT DETECTED Final   Klebsiella aerogenes NOT DETECTED NOT DETECTED Final   Klebsiella oxytoca NOT DETECTED NOT DETECTED Final   Klebsiella pneumoniae NOT DETECTED NOT DETECTED Final   Proteus species NOT DETECTED NOT DETECTED Final   Salmonella species NOT DETECTED NOT DETECTED Final   Serratia marcescens NOT DETECTED NOT DETECTED Final   Haemophilus influenzae NOT DETECTED NOT DETECTED Final   Neisseria meningitidis NOT DETECTED NOT DETECTED Final   Pseudomonas aeruginosa NOT DETECTED NOT DETECTED Final   Stenotrophomonas maltophilia NOT DETECTED NOT DETECTED Final   Candida albicans NOT DETECTED NOT DETECTED Final   Candida auris NOT DETECTED NOT DETECTED Final   Candida glabrata NOT DETECTED NOT DETECTED Final   Candida krusei NOT DETECTED NOT DETECTED Final   Candida parapsilosis NOT DETECTED NOT DETECTED Final   Candida tropicalis NOT DETECTED NOT DETECTED Final   Cryptococcus neoformans/gattii NOT DETECTED NOT DETECTED Final   Vancomycin resistance DETECTED (A) NOT DETECTED Final    Comment: CRITICAL RESULT CALLED TO, READ BACK BY AND VERIFIED WITH: Delight Stare 07/22/22 @  1446 BY SB Performed at [facility], [address] [place], [place] [address]     IMAGING RESULTS: CXR- no acitve disease I have personally reviewed the films ? Impression/Recommendation  Decreased urine output- no UTI- r/o infection which could cause poor intake ? ?VRE bacteremia- 1/4- will not consider this as a contaminant especially with intraabdominal abscesses  He had ruptured appendix/and sigmoid volovulus and underwent colectomy in Oct/Nov at [facility]( records not  available) and had intraabdominal abscess for which he had a drain The culture on the abdominal fluid was enterococcus and candida- was R to ampicillin- looks like he was treated before but not sure of duraion The JP drain was removed on 11/27 inspite of a sizeable abscess in the abdomen Will repeat Ct abdomen now Discussed with radiologist not- much of a change'will need to speak to IR to see whether it can be aspirated Repeat blood culture Start linezolid  Sacral decub stage -2 looks okay  Anemia  ? ___________________________________________________ Discussed with requesting provider Note:  This document was prepared using Dragon voice recognition software and may include unintentional dictation errors.

## 2022-07-23 NOTE — TOC Transition Note (Signed)
Transition of Care [facility]) - CM/SW Discharge Note   Patient Details  Name: Carl Figueroa MRN: [id number] Date of Birth: 12-04-1945  Transition of Care [facility]) CM/SW Contact:  Allayne Butcher, RN Phone Number: 07/23/2022, 11:40 AM   Clinical Narrative:    Patient placed under observation for AKI now medically cleared for discharge home with home health services.  Patient is open with [facility] for RN, PT, OT and SW.  Patient will need EMS transport home.  ED secretary will arrange EMS transport.  Barbara Cower with [facility] aware of discharge today, no new HH ordered required as patient only under observation.    Final next level of care: Home w Home Health Services Barriers to Discharge: Barriers Resolved   Patient Goals and CMS Choice Patient states their goals for this hospitalization and ongoing recovery are:: DC home CMS Medicare.gov Compare Post Acute Care list provided to:: Patient Represenative (must comment) Choice offered to / list presented to : Spouse  Discharge Placement                Patient to be transferred to facility by: EMS to take home Name of family member notified: Wille Celeste, Spouse Patient and family notified of of transfer: 07/23/22  Discharge Plan and Services                DME Arranged: N/A DME Agency: NA       HH Arranged: RN, PT, OT, Social Work [employer] Agency: [facility] ([facility]) Date HH Agency Contacted: 07/23/22 Time HH Agency Contacted: [phone] Representative spoke with at [facility] Agency: Barbara Cower  Social Determinants of Health (SDOH) Interventions     Readmission Risk Interventions     No data to display

## 2022-07-23 NOTE — Plan of Care (Signed)
  Problem: Respiratory: Goal: Ability to maintain adequate ventilation will improve Outcome: Progressing   Problem: Nutrition: Goal: Adequate nutrition will be maintained Outcome: Progressing   

## 2022-07-23 NOTE — Discharge Summary (Signed)
Physician Discharge Summary  Carl Figueroa NWG:[id number] DOB: [birth year] DOA: 07/21/2022  PCP: Mick Sell, MD  Admit date: 07/21/2022 Discharge date: 07/23/2022  Admitted From: Home Disposition:  Home  Recommendations for Outpatient Follow-up:  Follow up with PCP in 1-2 weeks   Home Health:No Equipment/Devices:Foley   Discharge Condition:Stable  CODE STATUS:FULL  Diet recommendation: Reg  Brief/Interim Summary: 76 year old male significant history of hypertension, anxiety, Lewy body dementia, osteoarthritis who presents to the ED with acute onset of hypotension associated with diminished urinary output. I had a conversation with the patient's wife at bedside today. She states over the last 2 to 3 days he has had decreasing level of mentation, increasing confusion, decreased oral intake and decreased urinary output. So she brought him to the emergency room. He does have evidence of acute kidney injury. His urinalysis is not indicative of infection. Admission status downgraded to observation. Will monitor overnight. Recheck 5 AM labs. If laboratory workup is stable patient will be discharged home in the care of his wife.   12/4: Patient stable for discharge.  No evidence of active infection.  No fever, no leukocytosis, negative procalcitonin.  Urinalysis clear.  1/4 aerobic blood culture positive for Enterococcus VCM.  Strongly suspecting contaminant.  Patient did receive 1 dose of daptomycin.  No indication at this time to resume antibiotics.  Repeat blood cultures taken on 12/3 are no growth to date.  Stable for discharge home at this time.  No changes to home medication regimen.    Discharge Diagnoses:  Principal Problem:   AKI (acute kidney injury) (HCC) Active Problems:   Hypotension   Acute lower UTI   Sacral decubitus ulcer, stage II (HCC)   Dyslipidemia   Dementia, senile with depression (HCC)   BPH (benign prostatic hyperplasia)  Acute kidney injury Patient  presented with AKI, mild hypotension due to hypovolemia.  Suspect this is due to inadequate oral fluid intake.  Patient was maintained on intravenous fluids and creatinine improved to reference range.  Urinalysis was not infected.  No evidence of active infection.  Positive blood culture 1/4 blood culture positive for Enterococcus VCM.  I suspect this to be a contaminant secondary to his sacral decubitus ulcer.  No evidence of active infection.  Patient did receive 1 dose of daptomycin.  No indication resume antibiotics.  Repeat blood cultures no growth to date.  Strongly suspect contaminant.  Discharge Instructions  Discharge Instructions     Diet - low sodium heart healthy   Complete by: As directed    Increase activity slowly   Complete by: As directed    No wound care   Complete by: As directed       Allergies as of 07/23/2022       Reactions   Morphine Nausea Only        Medication List     STOP taking these medications    QUEtiapine 25 MG tablet Commonly known as: SEROQUEL       TAKE these medications    finasteride 5 MG tablet Commonly known as: PROSCAR Take 5 mg by mouth daily.   furosemide 40 MG tablet Commonly known as: LASIX Take 1 tablet (40 mg total) by mouth daily as needed. As needed for leg edema   sertraline 25 MG tablet Commonly known as: ZOLOFT Take 25 mg by mouth daily.   tamsulosin 0.4 MG Caps capsule Commonly known as: FLOMAX Take 0.4 mg by mouth daily.        Allergies  Allergen Reactions   Morphine Nausea Only    Consultations: None   Procedures/Studies: DG Chest Port 1 View  Result Date: 07/21/2022 CLINICAL DATA:  Sepsis EXAM: PORTABLE CHEST 1 VIEW COMPARISON:  07/07/2022 FINDINGS: The heart size and mediastinal contours are within normal limits. Both lungs are clear. The visualized skeletal structures are unremarkable. IMPRESSION: No active disease. Electronically Signed   By: Deatra Robinson M.D.   On: 07/21/2022 23:07    CT ABDOMEN PELVIS W CONTRAST  Result Date: 07/13/2022 CLINICAL DATA:  Acute nonlocalized abdominal pain; drain in place; follow-up imaging for drain management EXAM: CT ABDOMEN AND PELVIS WITH CONTRAST TECHNIQUE: Multidetector CT imaging of the abdomen and pelvis was performed using the standard protocol following bolus administration of intravenous contrast. RADIATION DOSE REDUCTION: This exam was performed according to the departmental dose-optimization program which includes automated exposure control, adjustment of the mA and/or kV according to patient size and/or use of iterative reconstruction technique. CONTRAST:  62mL OMNIPAQUE IOHEXOL 350 MG/ML SOLN COMPARISON:  CT 07/07/2022 FINDINGS: Lower chest: Mild cardiomegaly. Trace pericardial effusion. Small left pleural effusion unchanged. Left basilar atelectasis/scarring. Hepatobiliary: Hepatic cysts. No follow-up imaging recommended. No evidence of cholecystitis. No biliary dilation. Pancreas: 2 cm homogeneous rounded cystic lesion in the body/tail of the pancreas which is unchanged and could represent a pseudocyst, serous or mucinous cystadenoma or side branch IPMN. MRI without and with contrast is recommended when clinically feasible. No pancreatic ductal dilation. Spleen: Normal in size without focal abnormality. Adrenals/Urinary Tract: Unremarkable adrenal glands. Low-attenuation lesions in the kidneys are statistically likely to represent cysts. No follow-up is required. Punctate nonobstructing left nephrolithiasis. No hydronephrosis. The bladder is mildly distended and filled with gas and fluid some of which is layering posteriorly and may be hyperdense though evaluation is limited by streak artifact. Foley catheter in the bladder. 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 Normal caliber small bowel filled with enteric contrast. Questionable rectal wall thickening and adjacent stranding/fluid though this area is poorly evaluated  due to streak artifact. Right lower quadrant ileostomy. Vascular/Lymphatic: Aortic atherosclerosis. No enlarged abdominal or pelvic lymph nodes. Reproductive: Mild enlargement of the prostate. Other: Right abdominal percutaneous drain within a intermediate density fluid collection along the anterior right abdominal wall tracking inferiorly to the superior aspect of the bladder and superiorly to the undersurface of the right hepatic lobe. This measures approximately 3.6 x 2.0 x 11.4 cm (series 2/image 39 and series 5/image 29). This is slightly decreased in size compared to 07/07/2022 when it measured approximately 4.2 x 2.4 x 15 cm. The gaseous component of the fluid collection has resolved. Mesenteric edema. Small amount of free fluid in the pelvis. Body wall anasarca. Musculoskeletal: Left TKA. No acute osseous abnormality. Advanced thoracolumbar spondylosis. Grade 1 anterolisthesis L4. IMPRESSION: 1. The percutaneous right abdominal wall pigtail catheter remains in the fluid collection in the right hemiabdomen which has slightly decreased in size. 2. The bladder is catheterized but remains distended with air and gas. Questionable layering hyperdense fluid within the posterior bladder though this is obscured by streak artifact. Recommend clinical correlation for catheter dysfunction and possible blood clot within the bladder. 3. Thickened bladder wall.  Cystitis not excluded. 4. 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 5. The rectum may be mildly thickened with adjacent stranding/fluid. Evaluation is limited by streak artifact. Findings may be due to congestion or proctitis. 6. 2 cm cystic lesion at the junction of the body and tail of the pancreas. MRI without  and with contrast is recommended when clinically feasible. Benign and malignant etiologies are possible. 7. Mesenteric edema and small amount of free fluid in the pelvis. Marked body wall anasarca. Findings may be related to  fluid overload. 8. Improved left lower lobe pneumonia. Residual small left pleural effusion. 9. Coronary artery and Aortic Atherosclerosis (ICD10-I70.0). Electronically Signed   By: Minerva Fester M.D.   On: 07/13/2022 22:02   EEG adult now  Result Date: 07/10/2022 Jefferson Fuel, MD     07/10/2022  8:27 AM Routine EEG Report Maxim Jaydn Moscato is a 76 y.o. male with a history of staring spells concerning for seizure who is undergoing an EEG to evaluate for seizures. Report: This EEG was acquired with electrodes placed according to the International 10-20 electrode system (including Fp1, Fp2, F3, F4, C3, C4, P3, P4, O1, O2, T3, T4, T5, T6, A1, A2, Fz, Cz, Pz). The following electrodes were missing or displaced: none. The occipital dominant rhythm was 5-6 Hz. This activity is reactive to stimulation. Drowsiness was manifested by background fragmentation; deeper stages of sleep were not identified. There was bifrontal slowing and intermittent periods lasting 1-2 seconds of diffuse delta slowing. There were no interictal epileptiform discharges. There were no electrographic seizures identified. There was no abnormal response to photic stimulation or hyperventilation. Impression and clinical correlation: This EEG was obtained while awake and drowsy and is abnormal due to: - moderate to severe diffuse slowing indicative of global cerebral dysfunction - bifrontal focal slowing indicative of focal cerebral dysfunction in these regions Epileptiform abnormalities were not seen during this recording. Bing Neighbors, MD [facility] [phone] If 7pm- 7am, please page neurology on call as listed in AMION.   US Carotid Bilateral (at [facility] and [facility] only)  Result Date: 07/08/2022 CLINICAL DATA:  Dementia EXAM: BILATERAL CAROTID DUPLEX ULTRASOUND TECHNIQUE: Wallace Cullens scale imaging, color Doppler and duplex ultrasound were performed of bilateral carotid and vertebral arteries in the neck. COMPARISON:  None  Available. FINDINGS: Criteria: Quantification of carotid stenosis is based on velocity parameters that correlate the residual internal carotid diameter with NASCET-based stenosis levels, using the diameter of the distal internal carotid lumen as the denominator for stenosis measurement. The following velocity measurements were obtained: RIGHT ICA: 124 cm/sec CCA: 80 cm/sec SYSTOLIC ICA/CCA RATIO:  1.5 ECA: 93 cm/sec LEFT ICA: 91 cm/sec CCA: 83 cm/sec SYSTOLIC ICA/CCA RATIO:  1.1 ECA: 87 cm/sec RIGHT CAROTID ARTERY: Patent without plaque or intimal thickening RIGHT VERTEBRAL ARTERY:  Patent with antegrade flow LEFT CAROTID ARTERY:  Patent without plaque or intimal thickening LEFT VERTEBRAL ARTERY:  Patent with antegrade flow IMPRESSION: 1. Negative study. No carotid stenosis identified in the neck by Doppler criteria. 2. The vertebral arteries are patent with antegrade flow. Electronically Signed   By: Agustin Cree M.D.   On: 07/08/2022 09:17   CT CHEST ABDOMEN PELVIS W CONTRAST  Result Date: 07/07/2022 CLINICAL DATA:  Recent surgery for perforated bowel at some point last month, presents with acute abdominal pain with leukocytosis. EXAM: CT CHEST, ABDOMEN, AND PELVIS WITH CONTRAST TECHNIQUE: Multidetector CT imaging of the chest, abdomen and pelvis was performed following the standard protocol during bolus administration of intravenous contrast. RADIATION DOSE REDUCTION: This exam was performed according to the departmental dose-optimization program which includes automated exposure control, adjustment of the mA and/or kV according to patient size and/or use of iterative reconstruction technique. CONTRAST:  80mL OMNIPAQUE IOHEXOL 300 MG/ML  SOLN COMPARISON:  Portable chest today, chest radiograph 07/06/2019. No prior chest  CT. Comparison CT abdomen and pelvis without contrast is available dated 07/06/2019. FINDINGS: CT CHEST FINDINGS Cardiovascular: The cardiac size is normal. There is no pericardial effusion.  There are scattered calcific plaques in the LAD coronary artery. There are scattered calcifications of the aortic valve leaflets, mild calcific plaques in the aortic arch and subclavian arteries. There is no aortic or great vessel aneurysm, dissection or stenosis. The pulmonary arteries and veins are normal caliber. The pulmonary arteries are centrally clear but not evaluated distal to the hila. Mediastinum/Nodes: No enlarged mediastinal, hilar, or axillary lymph nodes. Thyroid gland, trachea, and esophagus demonstrate no significant findings. Lungs/Pleura: Eventration and asymmetric elevation noted left hemidiaphragm. There is patchy airspace disease in the left lower lobe consistent with pneumonia or aspiration with small layering parapneumonic pleural effusion without evidence of empyema. There is no right pleural effusion. No pneumothorax or pleural thickening. Respiratory motion is noted on exam. Central airways and remaining lungs are clear, as visualized. Musculoskeletal: Moderate bilateral shoulder DJD. Mild dextroscoliosis of advanced marginal osteophytosis thoracic spine. 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 No acute or other significant osseous findings. No chest wall mass is seen. CT ABDOMEN PELVIS FINDINGS Technical note: Limited image resolution in the abdomen and pelvis due to patient motion, breathing motion, and streak artifact from the patient's arms in the field and overlying wires. Hepatobiliary: No obvious abnormality. There are tiny stones in the gallbladder proximally but no wall thickening. The bile ducts are normal caliber. Pancreas: No solid mass enhancement. There is a 2 cm homogeneous rounded cystic lesion at the junction of the body and tail of the pancreas, posterior aspect, Hounsfield density of 7 units (see series 2, axial images 54-56). This could be a pseudocyst, serous  or mucinous cystadenoma or side branch IPMN. Other etiologies such as malignancy are possible especially given the motion and streak artifacts through the area. MRI without and with contrast recommended when clinically feasible. There is no pancreatic ductal dilatation or other focal abnormality. Spleen: Not well seen but no obvious abnormality.  No splenomegaly. Adrenals/Urinary Tract: There is no adrenal mass. There is a 1 cm homogeneous cyst in the posterior aspect of the left kidney, Hounsfield density of 7 units. There are a few additional bilateral scattered cortical hypodensities which are too small to characterize. No follow-up imaging is recommended. For reference see JACR 2018 Feb; 264-273, Management of the Incidental Renal Mass on CT, Radio[employer] 2021; 814-848, Bosniak Classification of Cystic Renal Masses, Version 2019. There is bilateral mild renal cortical thinning. There are punctate nonobstructive caliceal stones posteriorly in left kidney. No nephrolithiasis is seen on the right. No hydronephrosis or ureteral stones. The bladder is catheterized but despite this is still distended with air and fluid. Clinical correlation recommended for catheter dysfunction. There is mild bladder thickening versus underdistention. Stomach/Bowel: The patient appears to have undergone a near total colectomy. The rectum remains and is sutured closed in the presacral space, with mild rectal wall thickening and perirectal stranding, the latter which could be congestive or related to fluid overload or proctitis. 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 A pigtail catheter is noted entering the lateral right mid abdominal wall with the pigtail within a right mid abdominal small bowel segment. There are a few slightly dilated left mid abdominal small bowel  segments, maximum caliber 2.9  cm, unknown if this is due to partial small bowel obstruction or ileus but there is no bowel dilatation elsewhere in the abdomen or pelvis. A transitional segment could not be found. Vascular/Lymphatic: There is diffuse mesenteric haziness. Unclear whether related to fluid overload, congestive, malnutrition or hepatic dysfunction. The abdominal aorta is unremarkable. There are patchy calcific plaques in the common iliac and internal iliac arteries. The portal vein is normal caliber and patent. Accounting for patient motion no enlarged lymph nodes are seen throughout. Reproductive: The prostate is not well seen due to streak artifact from left hip replacement, but transversely it measures enlarged at 5.4 cm. Seminal vesicles are normal in size. Both testicles are in the scrotal sac and there are small scrotal hydroceles. Other: Minimal abdominal and pelvic ascites. There is no free air, abscess or free hemorrhage. Presence of diffuse mesenteric edema limits assessment for focal inflammatory change. There has been interval weight loss since 07/06/2019 with reduction in the intra-abdominal and subcutaneous body fat stores. There is mild body wall anasarca. Musculoskeletal: Left THA. Moderately advanced right hip DJD. There is dextroscoliosis and advanced degenerative change of the lumbar spine. Advanced facet hypertrophy L3-4 down is noted with degenerative grade 1 anterolisthesis at L4-5 and L5-S1 and severe acquired foraminal and spinal canal stenosis at both levels. 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 There is no destructive bone lesion. IMPRESSION: 1. Left lower lobe pneumonia or aspiration with small layering parapneumonic effusion. 2. Aortic and coronary artery atherosclerosis. No thoracic or abdominal aortic aneurysm. 3. Aortic valve leaflet calcifications. Echocardiography may be helpful to assess for aortic stenosis. 4. Diffuse mesenteric haziness/edema. Unclear whether this is related to fluid  overload, congestive, malnutrition or hepatic dysfunction. Only minimal ascites. No free air. 5. Cholelithiasis without evidence of cholecystitis. 6. Nonobstructive micronephrolithiasis. 7. 2 cm cystic lesion at the junction of the body and tail of the pancreas. MRI without and with contrast recommended when clinically feasible. Benign and malignant etiologies are possible. 8. 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 No parastomal hernia. 9. Slightly dilated left mid abdominal small bowel segments, unknown if this is due to partial SBO or ileus. 10. The rectum remains, and there is rectal thickening and surrounding stranding which could be congestive or due to proctitis. 11. Thickened stomach and scattered mid to lower abdominal small bowel segments which could be due to gastroenteritis or congestive. 12. Bladder catheterized but despite this is still distended with air and fluid. Clinical correlation recommended for catheter dysfunction. Cystitis versus bladder nondistention. 13. Prostatomegaly. 14. 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 15. Remaining findings described above. Electronically Signed   By: Almira Bar M.D.   On: 07/07/2022 20:41   CT Head Wo Contrast  Result Date: 07/07/2022 CLINICAL DATA:  Mental status change. EXAM: CT HEAD WITHOUT CONTRAST TECHNIQUE: Contiguous axial images were obtained from the base of the skull through the vertex without intravenous contrast. RADIATION DOSE REDUCTION: This exam was performed according to the departmental dose-optimization program which includes automated exposure control, adjustment of the mA and/or kV according to patient size and/or use of iterative reconstruction technique. COMPARISON:  Jan 11, 2022 CT scan the brain FINDINGS: Brain: Ventricles and sulci are prominent but stable. No mass effect or midline shift. No cyst subdural,  epidural, or subarachnoid hemorrhage. Cerebellum, brainstem, and basal cisterns are normal. Scattered white matter changes  are identified. No acute cortical ischemia or infarct. Vascular: No hyperdense vessel or unexpected calcification. Skull: Normal. Negative for fracture or focal lesion. Sinuses/Orbits: No acute finding. Other: None. IMPRESSION: Chronic white matter changes. No acute intracranial abnormalities. Electronically Signed   By: Gerome Sam III M.D.   On: 07/07/2022 17:18   DG Chest Portable 1 View  Result Date: 07/07/2022 CLINICAL DATA:  Shortness of breath and tachycardia. EXAM: PORTABLE CHEST 1 VIEW COMPARISON:  07/06/2019 FINDINGS: The cardiomediastinal silhouette is unremarkable. Elevation of the LEFT hemidiaphragm with mild LEFT basilar atelectasis/scarring again noted. There is no evidence of focal airspace disease, pulmonary edema, suspicious pulmonary nodule/mass, pleural effusion, or pneumothorax. No acute bony abnormalities are identified. IMPRESSION: No active disease. Electronically Signed   By: Harmon Pier M.D.   On: 07/07/2022 16:37      Subjective: Seen and examined the day of discharge.  Stable no distress.  Wife at bedside.  Patient appropriate for discharge home.  Discharge Exam: Vitals:   07/23/22 0400 07/23/22 0959  BP: 115/80 119/63  Pulse: 80 78  Resp: 17 16  Temp: 98.3 F (36.8 C) 98.4 F (36.9 C)  SpO2: 98% 98%   Vitals:   07/23/22 0100 07/23/22 0200 07/23/22 0400 07/23/22 0959  BP: 126/78 111/87 115/80 119/63  Pulse:   80 78  Resp:   17 16  Temp:   98.3 F (36.8 C) 98.4 F (36.9 C)  TempSrc:   Oral Oral  SpO2:   98% 98%    General: Pt is alert, awake, not in acute distress Cardiovascular: RRR, S1/S2 +, no rubs, no gallops Respiratory: CTA bilaterally, no wheezing, no rhonchi Abdominal: Soft, NT, ND, bowel sounds + Extremities: no edema, no cyanosis    The results of significant diagnostics from this hospitalization (including  imaging, microbiology, ancillary and laboratory) are listed below for reference.     Microbiology: Recent Results (from the past 240 hour(s))  Urine Culture     Status: None   Collection Time: 07/21/22 10:45 PM   Specimen: In/Out Cath Urine  Result Value Ref Range Status   Specimen Description   Final    IN/OUT CATH URINE Performed at [facility], [address], [place], [place] [address]    Special Requests   Final    NONE Performed at [facility], [address] [place], [place] [address]    Culture   Final    NO GROWTH Performed at [facility], [address] [place], [place] [address]    Report Status 07/23/2022 FINAL  Final  Resp Panel by RT-PCR (Flu A&B, Covid) Anterior Nasal Swab     Status: None   Collection Time: 07/21/22 10:52 PM   Specimen: Anterior Nasal Swab  Result Value Ref Range Status   SARS Coronavirus 2 by RT PCR NEGATIVE NEGATIVE Final    Comment: (NOTE) SARS-CoV-2 target nucleic acids are NOT DETECTED.  The SARS-CoV-2 RNA is generally detectable in upper respiratory specimens during the acute phase of infection. The lowest concentration of SARS-CoV-2 viral copies this assay can detect is 138 copies/mL. A negative result does not preclude SARS-Cov-2 infection and should not be used as the sole basis for treatment or other patient management decisions. A negative result may occur with  improper specimen collection/handling, submission of specimen other than nasopharyngeal swab, presence of viral mutation(s) within the areas targeted by this assay, and inadequate number of viral copies(<138 copies/mL). A negative result must be combined with clinical observations,  patient history, and epidemiological information. The expected result is Negative.  Fact Sheet for Patients:  BloggerCourse.com  Fact Sheet for Healthcare Providers:  SeriousBroker.it  This test is no t yet  approved or cleared by the [place] FDA and  has been authorized for detection and/or diagnosis of SARS-CoV-2 by FDA under an Emergency Use Authorization (EUA). This EUA will remain  in effect (meaning this test can be used) for the duration of the COVID-19 declaration under Section 564(b)(1) of the Act, 21 U.S.C.section 360bbb-3(b)(1), unless the authorization is terminated  or revoked sooner.       Influenza A by PCR NEGATIVE NEGATIVE Final   Influenza B by PCR NEGATIVE NEGATIVE Final    Comment: (NOTE) The Xpert Xpress SARS-CoV-2/FLU/RSV plus assay is intended as an aid in the diagnosis of influenza from Nasopharyngeal swab specimens and should not be used as a sole basis for treatment. Nasal washings and aspirates are unacceptable for Xpert Xpress SARS-CoV-2/FLU/RSV testing.  Fact Sheet for Patients: BloggerCourse.com  Fact Sheet for Healthcare Providers: SeriousBroker.it  This test is not yet approved or cleared by the [place] FDA and has been authorized for detection and/or diagnosis of SARS-CoV-2 by FDA under an Emergency Use Authorization (EUA). This EUA will remain in effect (meaning this test can be used) for the duration of the COVID-19 declaration under Section 564(b)(1) of the Act, 21 U.S.C. section 360bbb-3(b)(1), unless the authorization is terminated or revoked.  Performed at [facility], [address] [place], [place] [address]   Blood Culture (routine x 2)     Status: Abnormal (Preliminary result)   Collection Time: 07/21/22 11:31 PM   Specimen: BLOOD RIGHT ARM  Result Value Ref Range Status   Specimen Description   Final    BLOOD RIGHT ARM Performed at [facility], [address], [place], [place] [address]    Special Requests   Final    Blood Culture adequate volume Performed at [facility], [address] [place], [place] [address]    Culture  Setup Time    Final    GRAM POSITIVE COCCI ANAEROBIC BOTTLE ONLY CRITICAL RESULT CALLED TO, READ BACK BY AND VERIFIED WITH: Delight Stare 07/22/22 @ 1446 BY SB    Culture (A)  Final    ENTEROCOCCUS FAECIUM CULTURE REINCUBATED FOR BETTER GROWTH Performed at [facility], [address] [place], [place] [address]    Report Status PENDING  Incomplete  Blood Culture (routine x 2)     Status: None (Preliminary result)   Collection Time: 07/21/22 11:31 PM   Specimen: BLOOD RIGHT ARM  Result Value Ref Range Status   Specimen Description BLOOD RIGHT ARM  Final   Special Requests Blood Culture adequate volume  Final   Culture   Final    NO GROWTH 2 DAYS Performed at [facility], [address] [place], [place] [address]    Report Status PENDING  Incomplete  Blood Culture ID Panel (Reflexed)     Status: Abnormal   Collection Time: 07/21/22 11:31 PM  Result Value Ref Range Status   Enterococcus faecalis NOT DETECTED NOT DETECTED Final   Enterococcus Faecium DETECTED (A) NOT DETECTED Final    Comment: CRITICAL RESULT CALLED TO, READ BACK BY AND VERIFIED WITH: Delight Stare 07/22/22 @ 1446 BY SB    Listeria monocytogenes NOT DETECTED NOT DETECTED Final   Staphylococcus species NOT DETECTED NOT DETECTED Final   Staphylococcus aureus (BCID) NOT DETECTED NOT DETECTED Final  Staphylococcus epidermidis NOT DETECTED NOT DETECTED Final   Staphylococcus lugdunensis NOT DETECTED NOT DETECTED Final   Streptococcus species NOT DETECTED NOT DETECTED Final   Streptococcus agalactiae NOT DETECTED NOT DETECTED Final   Streptococcus pneumoniae NOT DETECTED NOT DETECTED Final   Streptococcus pyogenes NOT DETECTED NOT DETECTED Final   A.calcoaceticus-baumannii NOT DETECTED NOT DETECTED Final   Bacteroides fragilis NOT DETECTED NOT DETECTED Final   Enterobacterales NOT DETECTED NOT DETECTED Final   Enterobacter cloacae complex NOT DETECTED NOT DETECTED Final   Escherichia coli NOT DETECTED NOT  DETECTED Final   Klebsiella aerogenes NOT DETECTED NOT DETECTED Final   Klebsiella oxytoca NOT DETECTED NOT DETECTED Final   Klebsiella pneumoniae NOT DETECTED NOT DETECTED Final   Proteus species NOT DETECTED NOT DETECTED Final   Salmonella species NOT DETECTED NOT DETECTED Final   Serratia marcescens NOT DETECTED NOT DETECTED Final   Haemophilus influenzae NOT DETECTED NOT DETECTED Final   Neisseria meningitidis NOT DETECTED NOT DETECTED Final   Pseudomonas aeruginosa NOT DETECTED NOT DETECTED Final   Stenotrophomonas maltophilia NOT DETECTED NOT DETECTED Final   Candida albicans NOT DETECTED NOT DETECTED Final   Candida auris NOT DETECTED NOT DETECTED Final   Candida glabrata NOT DETECTED NOT DETECTED Final   Candida krusei NOT DETECTED NOT DETECTED Final   Candida parapsilosis NOT DETECTED NOT DETECTED Final   Candida tropicalis NOT DETECTED NOT DETECTED Final   Cryptococcus neoformans/gattii NOT DETECTED NOT DETECTED Final   Vancomycin resistance DETECTED (A) NOT DETECTED Final    Comment: CRITICAL RESULT CALLED TO, READ BACK BY AND VERIFIED WITH: Delight Stare 07/22/22 @ 1446 BY SB Performed at [facility], [address] [place], [place] [address]      Labs: BNP (last 3 results) Recent Labs    03/19/22 1223  BNP 164.8*   Basic Metabolic Panel: Recent Labs  Lab 07/21/22 2001 07/22/22 0631 07/23/22 1044  NA 146* 144 142  K 3.7 3.6 3.7  CL 117* 120* 117*  CO2 24 22 23   GLUCOSE 87 85 88  BUN 19 16 15   CREATININE 1.47* 1.17 1.20  CALCIUM 9.8 9.5 9.3  MG 2.0  --   --    Liver Function Tests: No results for input(s): "AST", "ALT", "ALKPHOS", "BILITOT", "PROT", "ALBUMIN" in the last 168 hours. No results for input(s): "LIPASE", "AMYLASE" in the last 168 hours. No results for input(s): "AMMONIA" in the last 168 hours. CBC: Recent Labs  Lab 07/21/22 2001 07/22/22 0631 07/23/22 1044  WBC 6.7 6.6 6.1  NEUTROABS 4.5  --  3.8  HGB 8.3* 7.7* 8.1*  HCT  26.3* 23.5* 25.9*  MCV 88.3 85.5 88.4  PLT 302 250 263   Cardiac Enzymes: Recent Labs  Lab 07/23/22 1043  CKTOTAL 75   BNP: Invalid input(s): "POCBNP" CBG: Recent Labs  Lab 07/22/22 2058 07/23/22 0354 07/23/22 0408 07/23/22 0757 07/23/22 1125  GLUCAP 108* 69* 72 92 83   D-Dimer No results for input(s): "DDIMER" in the last 72 hours. Hgb A1c No results for input(s): "HGBA1C" in the last 72 hours. Lipid Profile No results for input(s): "CHOL", "HDL", "LDLCALC", "TRIG", "CHOLHDL", "LDLDIRECT" in the last 72 hours. Thyroid function studies No results for input(s): "TSH", "T4TOTAL", "T3FREE", "THYROIDAB" in the last 72 hours.  Invalid input(s): "FREET3" Anemia work up Recent Labs    07/22/22 0827  VITAMINB12 610  FOLATE 14.3  FERRITIN 211  TIBC 185*  IRON 39*   Urinalysis    Component Value Date/Time  COLORURINE YELLOW (A) 07/21/2022 2252   APPEARANCEUR CLOUDY (A) 07/21/2022 2252   LABSPEC 1.012 07/21/2022 2252   PHURINE 5.0 07/21/2022 2252   GLUCOSEU NEGATIVE 07/21/2022 2252   HGBUR LARGE (A) 07/21/2022 2252   BILIRUBINUR NEGATIVE 07/21/2022 2252   KETONESUR NEGATIVE 07/21/2022 2252   PROTEINUR 30 (A) 07/21/2022 2252   NITRITE NEGATIVE 07/21/2022 2252   LEUKOCYTESUR NEGATIVE 07/21/2022 2252   Sepsis Labs Recent Labs  Lab 07/21/22 2001 07/22/22 0631 07/23/22 1044  WBC 6.7 6.6 6.1   Microbiology Recent Results (from the past 240 hour(s))  Urine Culture     Status: None   Collection Time: 07/21/22 10:45 PM   Specimen: In/Out Cath Urine  Result Value Ref Range Status   Specimen Description   Final    IN/OUT CATH URINE Performed at [facility], [address], [place], [place] [address]    Special Requests   Final    NONE Performed at [facility], [address], [place], [place] [address]    Culture   Final    NO GROWTH Performed at [facility], [address], [place], [place] [address]    Report Status  07/23/2022 FINAL  Final  Resp Panel by RT-PCR (Flu A&B, Covid) Anterior Nasal Swab     Status: None   Collection Time: 07/21/22 10:52 PM   Specimen: Anterior Nasal Swab  Result Value Ref Range Status   SARS Coronavirus 2 by RT PCR NEGATIVE NEGATIVE Final    Comment: (NOTE) SARS-CoV-2 target nucleic acids are NOT DETECTED.  The SARS-CoV-2 RNA is generally detectable in upper respiratory specimens during the acute phase of infection. The lowest concentration of SARS-CoV-2 viral copies this assay can detect is 138 copies/mL. A negative result does not preclude SARS-Cov-2 infection and should not be used as the sole basis for treatment or other patient management decisions. A negative result may occur with  improper specimen collection/handling, submission of specimen other than nasopharyngeal swab, presence of viral mutation(s) within the areas targeted by this assay, and inadequate number of viral copies(<138 copies/mL). A negative result must be combined with clinical observations, patient history, and epidemiological information. The expected result is Negative.  Fact Sheet for Patients:  BloggerCourse.com  Fact Sheet for Healthcare Providers:  SeriousBroker.it  This test is no t yet approved or cleared by the [place] FDA and  has been authorized for detection and/or diagnosis of SARS-CoV-2 by FDA under an Emergency Use Authorization (EUA). This EUA will remain  in effect (meaning this test can be used) for the duration of the COVID-19 declaration under Section 564(b)(1) of the Act, 21 U.S.C.section 360bbb-3(b)(1), unless the authorization is terminated  or revoked sooner.       Influenza A by PCR NEGATIVE NEGATIVE Final   Influenza B by PCR NEGATIVE NEGATIVE Final    Comment: (NOTE) The Xpert Xpress SARS-CoV-2/FLU/RSV plus assay is intended as an aid in the diagnosis of influenza from Nasopharyngeal swab specimens  and should not be used as a sole basis for treatment. Nasal washings and aspirates are unacceptable for Xpert Xpress SARS-CoV-2/FLU/RSV testing.  Fact Sheet for Patients: BloggerCourse.com  Fact Sheet for Healthcare Providers: SeriousBroker.it  This test is not yet approved or cleared by the [place] FDA and has been authorized for detection and/or diagnosis of SARS-CoV-2 by FDA under an Emergency Use Authorization (EUA). This EUA will remain in effect (meaning this test can be used) for the duration of the COVID-19 declaration under Section  564(b)(1) of the Act, 21 U.S.C. section 360bbb-3(b)(1), unless the authorization is terminated or revoked.  Performed at [facility], [address] [place], [place] [address]   Blood Culture (routine x 2)     Status: Abnormal (Preliminary result)   Collection Time: 07/21/22 11:31 PM   Specimen: BLOOD RIGHT ARM  Result Value Ref Range Status   Specimen Description   Final    BLOOD RIGHT ARM Performed at [facility], [address], [place], [place] [address]    Special Requests   Final    Blood Culture adequate volume Performed at [facility], [address] [place], [place] [address]    Culture  Setup Time   Final    GRAM POSITIVE COCCI ANAEROBIC BOTTLE ONLY CRITICAL RESULT CALLED TO, READ BACK BY AND VERIFIED WITH: Delight Stare 07/22/22 @ 1446 BY SB    Culture (A)  Final    ENTEROCOCCUS FAECIUM CULTURE REINCUBATED FOR BETTER GROWTH Performed at [facility], [address], [place], [place] [address]    Report Status PENDING  Incomplete  Blood Culture (routine x 2)     Status: None (Preliminary result)   Collection Time: 07/21/22 11:31 PM   Specimen: BLOOD RIGHT ARM  Result Value Ref Range Status   Specimen Description BLOOD RIGHT ARM  Final   Special Requests Blood Culture adequate volume  Final   Culture   Final    NO GROWTH 2  DAYS Performed at [facility], [address] [place], [place] [address]    Report Status PENDING  Incomplete  Blood Culture ID Panel (Reflexed)     Status: Abnormal   Collection Time: 07/21/22 11:31 PM  Result Value Ref Range Status   Enterococcus faecalis NOT DETECTED NOT DETECTED Final   Enterococcus Faecium DETECTED (A) NOT DETECTED Final    Comment: CRITICAL RESULT CALLED TO, READ BACK BY AND VERIFIED WITH: Delight Stare 07/22/22 @ 1446 BY SB    Listeria monocytogenes NOT DETECTED NOT DETECTED Final   Staphylococcus species NOT DETECTED NOT DETECTED Final   Staphylococcus aureus (BCID) NOT DETECTED NOT DETECTED Final   Staphylococcus epidermidis NOT DETECTED NOT DETECTED Final   Staphylococcus lugdunensis NOT DETECTED NOT DETECTED Final   Streptococcus species NOT DETECTED NOT DETECTED Final   Streptococcus agalactiae NOT DETECTED NOT DETECTED Final   Streptococcus pneumoniae NOT DETECTED NOT DETECTED Final   Streptococcus pyogenes NOT DETECTED NOT DETECTED Final   A.calcoaceticus-baumannii NOT DETECTED NOT DETECTED Final   Bacteroides fragilis NOT DETECTED NOT DETECTED Final   Enterobacterales NOT DETECTED NOT DETECTED Final   Enterobacter cloacae complex NOT DETECTED NOT DETECTED Final   Escherichia coli NOT DETECTED NOT DETECTED Final   Klebsiella aerogenes NOT DETECTED NOT DETECTED Final   Klebsiella oxytoca NOT DETECTED NOT DETECTED Final   Klebsiella pneumoniae NOT DETECTED NOT DETECTED Final   Proteus species NOT DETECTED NOT DETECTED Final   Salmonella species NOT DETECTED NOT DETECTED Final   Serratia marcescens NOT DETECTED NOT DETECTED Final   Haemophilus influenzae NOT DETECTED NOT DETECTED Final   Neisseria meningitidis NOT DETECTED NOT DETECTED Final   Pseudomonas aeruginosa NOT DETECTED NOT DETECTED Final   Stenotrophomonas maltophilia NOT DETECTED NOT DETECTED Final   Candida albicans NOT DETECTED NOT DETECTED Final   Candida auris NOT DETECTED  NOT DETECTED Final   Candida glabrata NOT DETECTED NOT DETECTED Final   Candida krusei NOT DETECTED NOT DETECTED Final   Candida parapsilosis NOT DETECTED NOT DETECTED  Final   Candida tropicalis NOT DETECTED NOT DETECTED Final   Cryptococcus neoformans/gattii NOT DETECTED NOT DETECTED Final   Vancomycin resistance DETECTED (A) NOT DETECTED Final    Comment: CRITICAL RESULT CALLED TO, READ BACK BY AND VERIFIED WITH: Delight Stare 07/22/22 @ 1446 BY SB Performed at [facility], [address] [place], [place] [address]      Time coordinating discharge: Over 30 minutes  SIGNED:   Tresa Moore, MD  [facility] 07/23/2022, 11:59 AM Pager   If 7PM-7AM, please contact night-coverage

## 2022-07-23 NOTE — Progress Notes (Signed)
Brief hospitalist update note.  Case discussed with Dr. Rivka Safer infectious disease.  Concerned that Enterococcus VCM noted 1/4 blood cultures could be translocation from previously noted intra-abdominal fluid collection.  Patient had a recent admission in which CAT scan was done that showed a persistent but decreased in size 11.4 cm.  (A fluid collection.  Previously had abdominal drain in place.  This abdominal drain was pulled by general surgery on previous admission.  Has not had repeat imaging.  We will discontinue discharge at this time.  Pursue CT abdomen pelvis with IV contrast.  Possible interventional radiology consultation based on results of CAT scan.  Patient was started on IV linezolid per infectious disease recommendations.  Lolita Patella MD  No charge

## 2022-07-24 ENCOUNTER — Observation Stay: Payer: No Typology Code available for payment source

## 2022-07-24 ENCOUNTER — Other Ambulatory Visit (HOSPITAL_COMMUNITY): Payer: Self-pay

## 2022-07-24 DIAGNOSIS — F0283 Dementia in other diseases classified elsewhere, unspecified severity, with mood disturbance: Secondary | ICD-10-CM | POA: Diagnosis present

## 2022-07-24 DIAGNOSIS — E861 Hypovolemia: Secondary | ICD-10-CM | POA: Diagnosis present

## 2022-07-24 DIAGNOSIS — Z885 Allergy status to narcotic agent status: Secondary | ICD-10-CM | POA: Diagnosis not present

## 2022-07-24 DIAGNOSIS — N179 Acute kidney failure, unspecified: Secondary | ICD-10-CM | POA: Diagnosis present

## 2022-07-24 DIAGNOSIS — Z96642 Presence of left artificial hip joint: Secondary | ICD-10-CM | POA: Diagnosis present

## 2022-07-24 DIAGNOSIS — G3183 Dementia with Lewy bodies: Secondary | ICD-10-CM | POA: Diagnosis present

## 2022-07-24 DIAGNOSIS — E43 Unspecified severe protein-calorie malnutrition: Secondary | ICD-10-CM | POA: Diagnosis present

## 2022-07-24 DIAGNOSIS — Z79899 Other long term (current) drug therapy: Secondary | ICD-10-CM | POA: Diagnosis not present

## 2022-07-24 DIAGNOSIS — E785 Hyperlipidemia, unspecified: Secondary | ICD-10-CM | POA: Diagnosis present

## 2022-07-24 DIAGNOSIS — R7881 Bacteremia: Secondary | ICD-10-CM | POA: Diagnosis not present

## 2022-07-24 DIAGNOSIS — N390 Urinary tract infection, site not specified: Secondary | ICD-10-CM | POA: Diagnosis present

## 2022-07-24 DIAGNOSIS — I9589 Other hypotension: Secondary | ICD-10-CM | POA: Diagnosis present

## 2022-07-24 DIAGNOSIS — N400 Enlarged prostate without lower urinary tract symptoms: Secondary | ICD-10-CM | POA: Diagnosis present

## 2022-07-24 DIAGNOSIS — Z1152 Encounter for screening for COVID-19: Secondary | ICD-10-CM | POA: Diagnosis not present

## 2022-07-24 DIAGNOSIS — Z6820 Body mass index (BMI) 20.0-20.9, adult: Secondary | ICD-10-CM | POA: Diagnosis not present

## 2022-07-24 DIAGNOSIS — K651 Peritoneal abscess: Secondary | ICD-10-CM | POA: Diagnosis present

## 2022-07-24 DIAGNOSIS — B952 Enterococcus as the cause of diseases classified elsewhere: Secondary | ICD-10-CM | POA: Diagnosis present

## 2022-07-24 DIAGNOSIS — I129 Hypertensive chronic kidney disease with stage 1 through stage 4 chronic kidney disease, or unspecified chronic kidney disease: Secondary | ICD-10-CM | POA: Diagnosis present

## 2022-07-24 DIAGNOSIS — Z1621 Resistance to vancomycin: Secondary | ICD-10-CM | POA: Diagnosis present

## 2022-07-24 DIAGNOSIS — D638 Anemia in other chronic diseases classified elsewhere: Secondary | ICD-10-CM | POA: Diagnosis present

## 2022-07-24 DIAGNOSIS — I959 Hypotension, unspecified: Secondary | ICD-10-CM | POA: Diagnosis not present

## 2022-07-24 DIAGNOSIS — N189 Chronic kidney disease, unspecified: Secondary | ICD-10-CM | POA: Diagnosis present

## 2022-07-24 DIAGNOSIS — L89152 Pressure ulcer of sacral region, stage 2: Secondary | ICD-10-CM | POA: Diagnosis present

## 2022-07-24 DIAGNOSIS — F419 Anxiety disorder, unspecified: Secondary | ICD-10-CM | POA: Diagnosis present

## 2022-07-24 DIAGNOSIS — Z87891 Personal history of nicotine dependence: Secondary | ICD-10-CM | POA: Diagnosis not present

## 2022-07-24 MED ORDER — SODIUM CHLORIDE 0.9 % IV SOLN: INTRAVENOUS | Status: DC | PRN

## 2022-07-24 MED ORDER — ENSURE ENLIVE PO LIQD
237.0000 mL | Freq: Three times a day (TID) | ORAL | Status: DC
  Administered 2022-07-25 – 2022-07-26 (×5): 237 mL via ORAL

## 2022-07-24 MED ORDER — ZINC SULFATE 220 (50 ZN) MG PO CAPS
220.0000 mg | ORAL_CAPSULE | Freq: Every day | ORAL | Status: DC
  Administered 2022-07-24 – 2022-07-27 (×3): 220 mg via ORAL
  Filled 2022-07-24 (×3): qty 1

## 2022-07-24 MED ORDER — ADULT MULTIVITAMIN W/MINERALS CH
1.0000 | ORAL_TABLET | Freq: Every day | ORAL | Status: DC
  Administered 2022-07-24 – 2022-07-27 (×3): 1 via ORAL
  Filled 2022-07-24 (×3): qty 1

## 2022-07-24 MED ORDER — VITAMIN C 500 MG PO TABS
500.0000 mg | ORAL_TABLET | Freq: Two times a day (BID) | ORAL | Status: DC
  Administered 2022-07-24 – 2022-07-27 (×5): 500 mg via ORAL
  Filled 2022-07-24 (×5): qty 1

## 2022-07-24 NOTE — Plan of Care (Signed)
  Problem: Fluid Volume: Goal: Hemodynamic stability will improve Outcome: Progressing   Problem: Clinical Measurements: Goal: Diagnostic test results will improve Outcome: Progressing Goal: Signs and symptoms of infection will decrease Outcome: Progressing   Problem: Respiratory: Goal: Ability to maintain adequate ventilation will improve Outcome: Progressing   Problem: Education: Goal: Knowledge of disease or condition will improve Outcome: Progressing Goal: Knowledge of secondary prevention will improve (MUST DOCUMENT ALL) Outcome: Progressing Goal: Knowledge of patient specific risk factors will improve (Mark N/A or DELETE if not current risk factor) Outcome: Progressing   Problem: Ischemic Stroke/TIA Tissue Perfusion: Goal: Complications of ischemic stroke/TIA will be minimized Outcome: Progressing   Problem: Coping: Goal: Will verbalize positive feelings about self Outcome: Progressing Goal: Will identify appropriate support needs Outcome: Progressing   Problem: Health Behavior/Discharge Planning: Goal: Ability to manage health-related needs will improve Outcome: Progressing Goal: Goals will be collaboratively established with patient/family Outcome: Progressing   Problem: Self-Care: Goal: Ability to participate in self-care as condition permits will improve Outcome: Progressing Goal: Verbalization of feelings and concerns over difficulty with self-care will improve Outcome: Progressing Goal: Ability to communicate needs accurately will improve Outcome: Progressing   Problem: Nutrition: Goal: Risk of aspiration will decrease Outcome: Progressing Goal: Dietary intake will improve Outcome: Progressing   Problem: Education: Goal: Knowledge of General Education information will improve Description: Including pain rating scale, medication(s)/side effects and non-pharmacologic comfort measures Outcome: Progressing   Problem: Health Behavior/Discharge  Planning: Goal: Ability to manage health-related needs will improve Outcome: Progressing   Problem: Clinical Measurements: Goal: Ability to maintain clinical measurements within normal limits will improve Outcome: Progressing Goal: Will remain free from infection Outcome: Progressing Goal: Diagnostic test results will improve Outcome: Progressing Goal: Respiratory complications will improve Outcome: Progressing Goal: Cardiovascular complication will be avoided Outcome: Progressing   Problem: Activity: Goal: Risk for activity intolerance will decrease Outcome: Progressing   Problem: Nutrition: Goal: Adequate nutrition will be maintained Outcome: Progressing   Problem: Coping: Goal: Level of anxiety will decrease Outcome: Progressing   Problem: Elimination: Goal: Will not experience complications related to bowel motility Outcome: Progressing Goal: Will not experience complications related to urinary retention Outcome: Progressing   Problem: Pain Managment: Goal: General experience of comfort will improve Outcome: Progressing   Problem: Safety: Goal: Ability to remain free from injury will improve Outcome: Progressing   Problem: Skin Integrity: Goal: Risk for impaired skin integrity will decrease Outcome: Progressing   

## 2022-07-24 NOTE — Consult Note (Signed)
Chief Complaint: RLQ intra abdominal abscess. Request is for aspiration possible drain placement.   Referring Physician(s): Dr. Michelene Gardener  Supervising Physician: Juliet Rude  Patient Status: [place] - In-pt  History of Present Illness: Carl Figueroa is a 76 y.o. male inpatient[facility]. Former smoker. History of HTN, Lewy body dementia. Recently admitted to the [facility] hospital for appendicitis / sigmoid volvulus s/p  appendectomy. Hospital stay complicated  by  colectomy with ileostomy with intra abdominal abscess drain placement.  Presented to the ED at [facility] on 12.3.23 with AMS, hypotension and decreased urinary output.  CT Abd pelvis from 12.4.23 reads Residual abscess in the right lower quadrant, slightly smaller than on 07/13/2022. Drain was removed on 11.27.23 by general surgery. Team is requesting an intra abdominal abscess drain placement.   Currently without any significant complaints. Patient alert and laying in bed,calm. Denies any fevers, headache, chest pain, SOB, cough, abdominal pain, nausea, vomiting or bleeding. Return precautions and treatment recommendations and follow-up discussed with the patient's wife who is agreeable with the plan.   Past Medical History:  Diagnosis Date   Anxiety    Arthritis    Hypertension    Lewy body dementia ([place])     Past Surgical History:  Procedure Laterality Date   COLON SURGERY     COLONOSCOPY     COLOSTOMY Right    FOOT SURGERY     X2   HERNIA REPAIR Right    INGUINAL    perforated bowel  05/2022   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   Procedure: TOTAL HIP ARTHROPLASTY ANTERIOR APPROACH;  Surgeon: Hessie Knows, MD;  Location: [facility] ORS;  Service: Orthopedics;  Laterality: Left;    Allergies: Morphine  Medications: Prior to Admission medications   Medication Sig Start Date End Date Taking? Authorizing Provider  finasteride (PROSCAR) 5 MG tablet Take 5 mg by mouth daily. 07/06/22 08/05/22 Yes [provider]  sertraline (ZOLOFT) 25 MG tablet Take 25 mg by mouth daily.   Yes [provider]  tamsulosin (FLOMAX) 0.4 MG CAPS capsule Take 0.4 mg by mouth daily. 07/06/22 08/05/22 Yes [provider]  furosemide (LASIX) 40 MG tablet Take 1 tablet (40 mg total) by mouth daily as needed. As needed for leg edema 05/09/22 06/09/23  Kathlen Mody, Cadence H, PA-C     Family History  Problem Relation Age of Onset   Cancer Mother    Diabetes Mellitus II Neg Hx     Social History   Socioeconomic History   Marital status: Married    Spouse name: Not on file   Number of children: Not on file   Years of education: Not on file   Highest education level: Not on file  Occupational History   Not on file  Tobacco Use   Smoking status: Former    Packs/day: 0.25    Years: 20.00    Total pack years: 5.00    Types: Cigarettes    Quit date: 10/28/2019    Years since quitting: 2.7   Smokeless tobacco: Never  Vaping Use   Vaping Use: Never used  Substance and Sexual Activity   Alcohol use: No   Drug use: No   Sexual activity: Not on file  Other Topics Concern   Not on file  Social History Narrative   Not on file   Social Determinants of Health   Financial Resource Strain: Not on file  Food Insecurity: No Food Insecurity (07/23/2022)   Hunger Vital Sign  Worried About Programme researcher, broadcasting/film/video in the Last Year: Never true    Ran Out of Food in the Last Year: Never true  Transportation Needs: No Transportation Needs (07/23/2022)   PRAPARE - Administrator, Civil Service (Medical): No    Lack of Transportation (Non-Medical): No  Physical Activity: Not on file  Stress: Not on file  Social Connections: Not on file    Review of Systems: A 12 point ROS discussed and pertinent positives are indicated in the HPI above.  All other systems are negative.  Review of Systems  Constitutional:  Negative for fever.  HENT:  Negative for congestion.   Respiratory:  Negative for  cough and shortness of breath.   Cardiovascular:  Negative for chest pain.  Gastrointestinal:  Negative for abdominal pain.  Neurological:  Negative for headaches.  Psychiatric/Behavioral:  Negative for behavioral problems and confusion.     Vital Signs: BP 108/79 (BP Location: Right Arm)   Pulse 65   Temp 97.9 F (36.6 C)   Resp 14   Ht 5\' 7"  (1.702 m)   Wt 128 lb 9.6 oz (58.3 kg)   SpO2 98%   BMI 20.14 kg/m     Physical Exam Vitals and nursing note reviewed.  Constitutional:      Appearance: He is well-developed.  HENT:     Head: Normocephalic.     Mouth/Throat:     Mouth: Mucous membranes are moist.  Cardiovascular:     Rate and Rhythm: Normal rate and regular rhythm.     Heart sounds: Normal heart sounds.  Pulmonary:     Effort: Pulmonary effort is normal.     Breath sounds: Normal breath sounds.  Musculoskeletal:        General: Normal range of motion.     Cervical back: Normal range of motion.  Skin:    General: Skin is warm and dry.  Neurological:     Mental Status: He is alert and oriented to person, place, and time. Mental status is at baseline.     Imaging: CT ABDOMEN PELVIS W CONTRAST  Result Date: 07/23/2022 CLINICAL DATA:  Acute onset hypotension with decreased urinary output. Abscess. EXAM: CT ABDOMEN AND PELVIS WITH CONTRAST TECHNIQUE: Multidetector CT imaging of the abdomen and pelvis was performed using the standard protocol following bolus administration of intravenous contrast. RADIATION DOSE REDUCTION: This exam was performed according to the departmental dose-optimization program which includes automated exposure control, adjustment of the mA and/or kV according to patient size and/or use of iterative reconstruction technique. CONTRAST:  14/11/2021 OMNIPAQUE IOHEXOL 300 MG/ML  SOLN COMPARISON:  07/13/2022. FINDINGS: Lower chest: New small right pleural effusion. Moderate left pleural effusion, increased. Compressive atelectasis in both lower lobes.  Atherosclerotic calcification of the aortic valve and coronary arteries. Heart is at the upper limits of normal in size to mildly enlarged. No pericardial effusion. Distal esophagus is grossly unremarkable. Hepatobiliary: Probable subcentimeter cysts in the liver. Liver is otherwise unremarkable. Stone in the gallbladder. Small pericholecystic fluid. No biliary ductal dilatation. Pancreas: 1.4 cm low-attenuation lesion in the pancreatic body, stable. No gland atrophy or ductal dilatation. Spleen: Negative. Adrenals/Urinary Tract: Renal parenchymal thinning bilaterally. Subcentimeter low-attenuation lesion in the left kidney. No specific follow-up necessary. Kidneys are otherwise unremarkable. Ureters are decompressed. Foley catheter is seen in a decompressed bladder. Bladder wall may be thickened. Stomach/Bowel: Stomach and majority of the small bowel are unremarkable. 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 Persistent rectal wall thickening with  presacral edema and fluid, as on 07/13/2022. Vascular/Lymphatic: Retroaortic left renal vein. Atherosclerotic calcification of the aorta. Fusiform dilatation of the celiac trunk, 10 mm. No pathologically enlarged lymph nodes. Reproductive: Prostate is visualized. Other: No additional free fluid. Well-circumscribed fluid collection in the ventral right lower quadrant measures 2.2 x 3.7 cm (2/45), previously 2.4 x 4.5 cm on 07/13/2022, at which time a percutaneous drain was in place. No additional evidence of an abscess. Diffuse body wall edema. Musculoskeletal: Left hip arthroplasty. Right hip osteoarthritis. Degenerative changes in the spine. Grade 1 anterolisthesis of L5 on S1. IMPRESSION: 1. Residual abscess in the right lower quadrant, slightly smaller than on 07/13/2022. Interval percutaneous drain removal. 2. New small right pleural effusion. Moderate left pleural effusion, increased. Compressive atelectasis in both lower lobes. 3. Persistent rectal  wall thickening with extensive presacral edema/fluid, unchanged. 4. Bladder wall thickening, as before. 5. Diffuse body wall edema. 6. Cholelithiasis. 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 8. 1.4 cm cystic lesion in body of the pancreas, as before. Given patient condition and age, no specific follow-up is recommended other than on routine imaging. 9. Aortic atherosclerosis (ICD10-I70.0). Coronary artery calcification. Electronically Signed   By: Lorin Picket M.D.   On: 07/23/2022 15:34   DG Chest Port 1 View  Result Date: 07/21/2022 CLINICAL DATA:  Sepsis EXAM: PORTABLE CHEST 1 VIEW COMPARISON:  07/07/2022 FINDINGS: The heart size and mediastinal contours are within normal limits. Both lungs are clear. The visualized skeletal structures are unremarkable. IMPRESSION: No active disease. Electronically Signed   By: Ulyses Jarred M.D.   On: 07/21/2022 23:07   CT ABDOMEN PELVIS W CONTRAST  Result Date: 07/13/2022 CLINICAL DATA:  Acute nonlocalized abdominal pain; drain in place; follow-up imaging for drain management EXAM: CT ABDOMEN AND PELVIS WITH CONTRAST TECHNIQUE: Multidetector CT imaging of the abdomen and pelvis was performed using the standard protocol following bolus administration of intravenous contrast. RADIATION DOSE REDUCTION: This exam was performed according to the departmental dose-optimization program which includes automated exposure control, adjustment of the mA and/or kV according to patient size and/or use of iterative reconstruction technique. CONTRAST:  71mL OMNIPAQUE IOHEXOL 350 MG/ML SOLN COMPARISON:  CT 07/07/2022 FINDINGS: Lower chest: Mild cardiomegaly. Trace pericardial effusion. Small left pleural effusion unchanged. Left basilar atelectasis/scarring. Hepatobiliary: Hepatic cysts. No follow-up imaging recommended. No evidence of cholecystitis. No biliary dilation. Pancreas: 2 cm homogeneous rounded cystic lesion in the body/tail of the pancreas which is unchanged and could  represent a pseudocyst, serous or mucinous cystadenoma or side branch IPMN. MRI without and with contrast is recommended when clinically feasible. No pancreatic ductal dilation. Spleen: Normal in size without focal abnormality. Adrenals/Urinary Tract: Unremarkable adrenal glands. Low-attenuation lesions in the kidneys are statistically likely to represent cysts. No follow-up is required. Punctate nonobstructing left nephrolithiasis. No hydronephrosis. The bladder is mildly distended and filled with gas and fluid some of which is layering posteriorly and may be hyperdense though evaluation is limited by streak artifact. Foley catheter in the bladder. 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 Normal caliber small bowel filled with enteric contrast. Questionable rectal wall thickening and adjacent stranding/fluid though this area is poorly evaluated due to streak artifact. Right lower quadrant ileostomy. Vascular/Lymphatic: Aortic atherosclerosis. No enlarged abdominal or pelvic lymph nodes. Reproductive: Mild enlargement of the prostate. Other: Right abdominal percutaneous drain within a intermediate density fluid collection along the anterior right abdominal wall tracking inferiorly to the superior aspect of the bladder and superiorly to the undersurface of the  right hepatic lobe. This measures approximately 3.6 x 2.0 x 11.4 cm (series 2/image 39 and series 5/image 29). This is slightly decreased in size compared to 07/07/2022 when it measured approximately 4.2 x 2.4 x 15 cm. The gaseous component of the fluid collection has resolved. Mesenteric edema. Small amount of free fluid in the pelvis. Body wall anasarca. Musculoskeletal: Left TKA. No acute osseous abnormality. Advanced thoracolumbar spondylosis. Grade 1 anterolisthesis L4. IMPRESSION: 1. The percutaneous right abdominal wall pigtail catheter remains in the fluid collection in the right hemiabdomen which has slightly decreased in size. 2.  The bladder is catheterized but remains distended with air and gas. Questionable layering hyperdense fluid within the posterior bladder though this is obscured by streak artifact. Recommend clinical correlation for catheter dysfunction and possible blood clot within the bladder. 3. Thickened bladder wall.  Cystitis not excluded. 4. 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 5. The rectum may be mildly thickened with adjacent stranding/fluid. Evaluation is limited by streak artifact. Findings may be due to congestion or proctitis. 6. 2 cm cystic lesion at the junction of the body and tail of the pancreas. MRI without and with contrast is recommended when clinically feasible. Benign and malignant etiologies are possible. 7. Mesenteric edema and small amount of free fluid in the pelvis. Marked body wall anasarca. Findings may be related to fluid overload. 8. Improved left lower lobe pneumonia. Residual small left pleural effusion. 9. Coronary artery and Aortic Atherosclerosis (ICD10-I70.0). Electronically Signed   By: Placido Sou M.D.   On: 07/13/2022 22:02   EEG adult now  Result Date: 07/10/2022 Derek Jack, MD     07/10/2022  8:27 AM Routine EEG Report Jarion Saksham Hardebeck is a 76 y.o. male with a history of staring spells concerning for seizure who is undergoing an EEG to evaluate for seizures. Report: This EEG was acquired with electrodes placed according to the International 10-20 electrode system (including Fp1, Fp2, F3, F4, C3, C4, P3, P4, O1, O2, T3, T4, T5, T6, A1, A2, Fz, Cz, Pz). The following electrodes were missing or displaced: none. The occipital dominant rhythm was 5-6 Hz. This activity is reactive to stimulation. Drowsiness was manifested by background fragmentation; deeper stages of sleep were not identified. There was bifrontal slowing and intermittent periods lasting 1-2 seconds of diffuse delta slowing. There were no interictal epileptiform discharges.  There were no electrographic seizures identified. There was no abnormal response to photic stimulation or hyperventilation. Impression and clinical correlation: This EEG was obtained while awake and drowsy and is abnormal due to: - moderate to severe diffuse slowing indicative of global cerebral dysfunction - bifrontal focal slowing indicative of focal cerebral dysfunction in these regions Epileptiform abnormalities were not seen during this recording. Su Monks, MD [facility] [phone] If 7pm- 7am, please page neurology on call as listed in [place].   US Carotid Bilateral (at [facility] and [facility] only)  Result Date: 07/08/2022 CLINICAL DATA:  Dementia EXAM: BILATERAL CAROTID DUPLEX ULTRASOUND TECHNIQUE: Pearline Cables scale imaging, color Doppler and duplex ultrasound were performed of bilateral carotid and vertebral arteries in the neck. COMPARISON:  None Available. FINDINGS: Criteria: Quantification of carotid stenosis is based on velocity parameters that correlate the residual internal carotid diameter with NASCET-based stenosis levels, using the diameter of the distal internal carotid lumen as the denominator for stenosis measurement. The following velocity measurements were obtained: RIGHT ICA: 124 cm/sec CCA: 80 cm/sec SYSTOLIC ICA/CCA RATIO:  1.5 ECA: 93 cm/sec LEFT ICA: 91 cm/sec  CCA: 83 cm/sec SYSTOLIC ICA/CCA RATIO:  1.1 ECA: 87 cm/sec RIGHT CAROTID ARTERY: Patent without plaque or intimal thickening RIGHT VERTEBRAL ARTERY:  Patent with antegrade flow LEFT CAROTID ARTERY:  Patent without plaque or intimal thickening LEFT VERTEBRAL ARTERY:  Patent with antegrade flow IMPRESSION: 1. Negative study. No carotid stenosis identified in the neck by Doppler criteria. 2. The vertebral arteries are patent with antegrade flow. Electronically Signed   By: Darrin Nipper M.D.   On: 07/08/2022 09:17   CT CHEST ABDOMEN PELVIS W CONTRAST  Result Date: 07/07/2022 CLINICAL DATA:  Recent surgery for perforated bowel at  some point last month, presents with acute abdominal pain with leukocytosis. EXAM: CT CHEST, ABDOMEN, AND PELVIS WITH CONTRAST TECHNIQUE: Multidetector CT imaging of the chest, abdomen and pelvis was performed following the standard protocol during bolus administration of intravenous contrast. RADIATION DOSE REDUCTION: This exam was performed according to the departmental dose-optimization program which includes automated exposure control, adjustment of the mA and/or kV according to patient size and/or use of iterative reconstruction technique. CONTRAST:  46mL OMNIPAQUE IOHEXOL 300 MG/ML  SOLN COMPARISON:  Portable chest today, chest radiograph 07/06/2019. No prior chest CT. Comparison CT abdomen and pelvis without contrast is available dated 07/06/2019. FINDINGS: CT CHEST FINDINGS Cardiovascular: The cardiac size is normal. There is no pericardial effusion. There are scattered calcific plaques in the LAD coronary artery. There are scattered calcifications of the aortic valve leaflets, mild calcific plaques in the aortic arch and subclavian arteries. There is no aortic or great vessel aneurysm, dissection or stenosis. The pulmonary arteries and veins are normal caliber. The pulmonary arteries are centrally clear but not evaluated distal to the hila. Mediastinum/Nodes: No enlarged mediastinal, hilar, or axillary lymph nodes. Thyroid gland, trachea, and esophagus demonstrate no significant findings. Lungs/Pleura: Eventration and asymmetric elevation noted left hemidiaphragm. There is patchy airspace disease in the left lower lobe consistent with pneumonia or aspiration with small layering parapneumonic pleural effusion without evidence of empyema. There is no right pleural effusion. No pneumothorax or pleural thickening. Respiratory motion is noted on exam. Central airways and remaining lungs are clear, as visualized. Musculoskeletal: Moderate bilateral shoulder DJD. Mild dextroscoliosis of advanced marginal  osteophytosis thoracic spine. 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 No acute or other significant osseous findings. No chest wall mass is seen. CT ABDOMEN PELVIS FINDINGS Technical note: Limited image resolution in the abdomen and pelvis due to patient motion, breathing motion, and streak artifact from the patient's arms in the field and overlying wires. Hepatobiliary: No obvious abnormality. There are tiny stones in the gallbladder proximally but no wall thickening. The bile ducts are normal caliber. Pancreas: No solid mass enhancement. There is a 2 cm homogeneous rounded cystic lesion at the junction of the body and tail of the pancreas, posterior aspect, Hounsfield density of 7 units (see series 2, axial images 54-56). This could be a pseudocyst, serous or mucinous cystadenoma or side branch IPMN. Other etiologies such as malignancy are possible especially given the motion and streak artifacts through the area. MRI without and with contrast recommended when clinically feasible. There is no pancreatic ductal dilatation or other focal abnormality. Spleen: Not well seen but no obvious abnormality.  No splenomegaly. Adrenals/Urinary Tract: There is no adrenal mass. There is a 1 cm homogeneous cyst in the posterior aspect of the left kidney, Hounsfield density of 7 units. There are a few additional bilateral scattered cortical  hypodensities which are too small to characterize. No follow-up imaging is recommended. For reference see JACR 2018 Feb; 264-273, Management of the Incidental Renal Mass on CT, [employer] 2021; 814-848, Bosniak Classification of Cystic Renal Masses, Version 2019. There is bilateral mild renal cortical thinning. There are punctate nonobstructive caliceal stones posteriorly in left kidney. No nephrolithiasis is seen on the right. No hydronephrosis or ureteral stones. The bladder is  catheterized but despite this is still distended with air and fluid. Clinical correlation recommended for catheter dysfunction. There is mild bladder thickening versus underdistention. Stomach/Bowel: The patient appears to have undergone a near total colectomy. The rectum remains and is sutured closed in the presacral space, with mild rectal wall thickening and perirectal stranding, the latter which could be congestive or related to fluid overload or proctitis. 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 A pigtail catheter is noted entering the lateral right mid abdominal wall with the pigtail within a right mid abdominal small bowel segment. There are a few slightly dilated left mid abdominal small bowel segments, maximum caliber 2.9 cm, unknown if this is due to partial small bowel obstruction or ileus but there is no bowel dilatation elsewhere in the abdomen or pelvis. A transitional segment could not be found. Vascular/Lymphatic: There is diffuse mesenteric haziness. Unclear whether related to fluid overload, congestive, malnutrition or hepatic dysfunction. The abdominal aorta is unremarkable. There are patchy calcific plaques in the common iliac and internal iliac arteries. The portal vein is normal caliber and patent. Accounting for patient motion no enlarged lymph nodes are seen throughout. Reproductive: The prostate is not well seen due to streak artifact from left hip replacement, but transversely it measures enlarged at 5.4 cm. Seminal vesicles are normal in size. Both testicles are in the scrotal sac and there are small scrotal hydroceles. Other: Minimal abdominal and pelvic ascites. There is no free air, abscess or free hemorrhage. Presence of diffuse mesenteric edema limits assessment for focal inflammatory change. There has been interval weight loss since 07/06/2019 with  reduction in the intra-abdominal and subcutaneous body fat stores. There is mild body wall anasarca. Musculoskeletal: Left THA. Moderately advanced right hip DJD. There is dextroscoliosis and advanced degenerative change of the lumbar spine. Advanced facet hypertrophy L3-4 down is noted with degenerative grade 1 anterolisthesis at L4-5 and L5-S1 and severe acquired foraminal and spinal canal stenosis at both levels. 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 There is no destructive bone lesion. IMPRESSION: 1. Left lower lobe pneumonia or aspiration with small layering parapneumonic effusion. 2. Aortic and coronary artery atherosclerosis. No thoracic or abdominal aortic aneurysm. 3. Aortic valve leaflet calcifications. Echocardiography may be helpful to assess for aortic stenosis. 4. Diffuse mesenteric haziness/edema. Unclear whether this is related to fluid overload, congestive, malnutrition or hepatic dysfunction. Only minimal ascites. No free air. 5. Cholelithiasis without evidence of cholecystitis. 6. Nonobstructive micronephrolithiasis. 7. 2 cm cystic lesion at the junction of the body and tail of the pancreas. MRI without and with contrast recommended when clinically feasible. Benign and malignant etiologies are possible. 8. 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 No parastomal hernia. 9. Slightly dilated left mid abdominal small bowel segments, unknown if this is due to partial SBO or ileus. 10. The rectum remains, and there is rectal thickening and surrounding stranding which could be congestive or due to proctitis. 11. Thickened  stomach and scattered mid to lower abdominal small bowel segments which could be due to gastroenteritis or congestive. 12. Bladder catheterized but despite this is still distended with air and fluid. Clinical correlation recommended for catheter dysfunction. Cystitis versus bladder nondistention. 13. Prostatomegaly. 14. Advanced degenerative changes of  the lumbar spine with severe acquired foraminal and spinal canal stenosis at the lowest 2 levels. 15. Remaining findings described above. Electronically Signed   By: Telford Nab M.D.   On: 07/07/2022 20:41   CT Head Wo Contrast  Result Date: 07/07/2022 CLINICAL DATA:  Mental status change. EXAM: CT HEAD WITHOUT CONTRAST TECHNIQUE: Contiguous axial images were obtained from the base of the skull through the vertex without intravenous contrast. RADIATION DOSE REDUCTION: This exam was performed according to the departmental dose-optimization program which includes automated exposure control, adjustment of the mA and/or kV according to patient size and/or use of iterative reconstruction technique. COMPARISON:  Jan 11, 2022 CT scan the brain FINDINGS: Brain: Ventricles and sulci are prominent but stable. No mass effect or midline shift. No cyst subdural, epidural, or subarachnoid hemorrhage. Cerebellum, brainstem, and basal cisterns are normal. Scattered white matter changes are identified. No acute cortical ischemia or infarct. Vascular: No hyperdense vessel or unexpected calcification. Skull: Normal. Negative for fracture or focal lesion. Sinuses/Orbits: No acute finding. Other: None. IMPRESSION: Chronic white matter changes. No acute intracranial abnormalities. Electronically Signed   By: Dorise Bullion III M.D.   On: 07/07/2022 17:18   DG Chest Portable 1 View  Result Date: 07/07/2022 CLINICAL DATA:  Shortness of breath and tachycardia. EXAM: PORTABLE CHEST 1 VIEW COMPARISON:  07/06/2019 FINDINGS: The cardiomediastinal silhouette is unremarkable. Elevation of the LEFT hemidiaphragm with mild LEFT basilar atelectasis/scarring again noted. There is no evidence of focal airspace disease, pulmonary edema, suspicious pulmonary nodule/mass, pleural effusion, or pneumothorax. No acute bony abnormalities are identified. IMPRESSION: No active disease. Electronically Signed   By: Margarette Canada M.D.   On: 07/07/2022  16:37    Labs:  CBC: Recent Labs    07/15/22 0430 07/21/22 2001 07/22/22 0631 07/23/22 1044  WBC 8.3 6.7 6.6 6.1  HGB 8.4* 8.3* 7.7* 8.1*  HCT 25.3* 26.3* 23.5* 25.9*  PLT 174 302 250 263    COAGS: No results for input(s): "INR", "APTT" in the last 8760 hours.  BMP: Recent Labs    07/15/22 0430 07/21/22 2001 07/22/22 0631 07/23/22 1044  NA 143 146* 144 142  K 3.6 3.7 3.6 3.7  CL 113* 117* 120* 117*  CO2 24 24 22 23   GLUCOSE 93 87 85 88  BUN 12 19 16 15   CALCIUM 10.0 9.8 9.5 9.3  CREATININE 1.04 1.47* 1.17 1.20  GFRNONAA >60 49* >60 >60    LIVER FUNCTION TESTS: Recent Labs    08/03/21 1043 01/11/22 1350 07/07/22 1633 07/23/22 1043  BILITOT 0.6 0.8 0.8 0.6  AST 11 15 35 18  ALT 6 10 20 11   ALKPHOS 89 60 127* 68  PROT 6.6 6.8 8.7* 5.6*  ALBUMIN 4.3 3.9 2.8* 2.2*     Assessment and Plan:  76 y.o. male inpatient[facility]. Former smoker. History of HTN, Lewy body dementia. Recently admitted to the [facility] hospital for appendicitis / sigmoid volvulus s/p  appendectomy. Hospital stay complicated  by  colectomy with ileostomy with intra abdominal abscess drain placement.  Presented to the ED at [facility] on 12.3.23 with AMS, hypotension and decreased urinary output.  CT Abd pelvis from 12.4.23 reads Residual abscess in the right  lower quadrant, slightly smaller than on 07/13/2022. Drain was removed on 11.27.23 by general surgery. Team is requesting an intra abdominal abscess drain placement.   Labs from 11.4.23 No leukocytosis, albumin 2.2, total protein < 5.6. Blood cultures from 12.4.23 pending. Patient is on lovenox prophylactic dosage. Last dose given on 12.5.23 @ 08:00. Allergies include Morphine. Patient has been NPO since midnight.  Risks and benefits discussed with the patient including bleeding, infection, damage to adjacent structures, bowel perforation/fistula connection, and sepsis.  All of the patient's  wife's questions were answered, patient's wife is  agreeable to proceed.  Consent signed and in CT control room.   Thank you for this interesting consult.  I greatly enjoyed meeting [facility] and look forward to participating in their care.  A copy of this report was sent to the requesting provider on this date.  Electronically Signed: Jacqualine Mau, NP 07/24/2022, 8:43 AM   I spent a total of 40 Minutes  in face to face in clinical consultation, greater than 50% of which was counseling/coordinating care for intra abdominal abscess placement.

## 2022-07-24 NOTE — Progress Notes (Signed)
PROGRESS NOTE    Elieser Tetrick  WUJ:[id number] DOB: Sep 23, 1945 DOA: 07/21/2022 PCP: Mick Sell, MD    Brief Narrative:  76 year old male significant history of hypertension, anxiety, Lewy body dementia, osteoarthritis who presents to the ED with acute onset of hypotension associated with diminished urinary output. I had a conversation with the patient's wife at bedside today. She states over the last 2 to 3 days he has had decreasing level of mentation, increasing confusion, decreased oral intake and decreased urinary output. So she brought him to the emergency room. He does have evidence of acute kidney injury. His urinalysis is not indicative of infection. Admission status downgraded to observation. Will monitor overnight. Recheck 5 AM labs. If laboratory workup is stable patient will be discharged home in the care of his wife.    Case discussed with Dr. Rivka Safer infectious disease. Concerned that Enterococcus VCM noted 1/4 blood cultures could be translocation from previously noted intra-abdominal fluid collection. Patient had a recent admission in which CAT scan was done that showed a persistent but decreased in size 11.4 cm. (A fluid collection. Previously had abdominal drain in place. This abdominal drain was pulled by general surgery on previous admission. Has not had repeat imaging.   Repeat CT abdomen pelvis pursued.  Case discussed with radiology.  Fluid pocket around the same size as prior.  Interventional radiology consulted.  Plan for percutaneous drainage.  Abscess cavity too small to leave drain in place.  Will send fluid for culture and sensitivities.  Patient on IV Zyvox.   Assessment & Plan:   Principal Problem:   AKI (acute kidney injury) (HCC) Active Problems:   Hypotension   Acute lower UTI   Sacral decubitus ulcer, stage II (HCC)   Dyslipidemia   Dementia, senile with depression (HCC)   BPH (benign prostatic hyperplasia)   Bacteremia  VRE  bacteremia Unclear source.  Urine appears clear.  Patient does not appear clinically infected.  Concerned that residual fluid pocket/abscess in the abdomen is source of infection with translocation into the blood.  Patient remains on IV Zyvox.  Repeat cultures pending, no growth to date.  Plan for interventional radiology consultation and percutaneous drainage of abscess cavity.  Continue IV Zyvox for now.  Infectious disease following.  Hypotension Resolved  Stage II sacral decubitus ulcer Wound care consult  BPH Flomax  Senile dementia with depression Continue PTA regimen of Exelon, Aricept, Zoloft, Effexor, Seroquel, Klonopin  Dyslipidemia PTA statin    DVT prophylaxis: SQ Lovenox Code Status: Full code Family Communication: Disposition Plan: Status is: Inpatient Remains inpatient appropriate because: Plan for interventional radiology consultation and percutaneous drainage of abscess cavity today.  Possible discharge in 24 hours   Level of care: Med-Surg  Consultants:  ID IR  Procedures:  Percutaneous abdominal fluid collection drainage 12/5  Antimicrobials: Linezolid   Subjective: Seen and examined.  Resting in bed.  No visible distress  Objective: Vitals:   07/23/22 1707 07/23/22 2029 07/24/22 0420 07/24/22 0841  BP: (!) 142/94 (!) 104/46 122/63 108/79  Pulse: 77 82 64 65  Resp: Temp: 98.7 F (37.1 C) 98.5 F (36.9 C) 98.3 F (36.8 C) 97.9 F (36.6 C)  TempSrc:  Oral Oral   SpO2: 100% 100% 97% 98%  Weight: 58.3 kg     Height:  (1.702 m)       Intake/Output Summary (Last 24 hours) at 07/24/2022 1243 Last data filed at 07/24/2022 0657 Gross per 24 hour  Intake 292.75 ml  Output 500 ml  Net -207.25 ml   Filed Weights   07/23/22 1707  Weight: 58.3 kg    Examination:  General exam: No acute distress Respiratory system: Clear to auscultation. Respiratory effort normal. Cardiovascular system: S1-S2, RRR, no murmurs, no pedal  edema Gastrointestinal system: Soft, NT/ND, normal bowel sounds,+ ileostomy Central nervous system: Alert and oriented. No focal neurological deficits. Extremities: Decreased power bilateral lower extremities Skin: No rashes, lesions or ulcers Psychiatry: Judgement and insight appear normal. Mood & affect appropriate.     Data Reviewed: I have personally reviewed following labs and imaging studies  CBC: Recent Labs  Lab 07/21/22 2001 07/22/22 0631 07/23/22 1044  WBC 6.7 6.6 6.1  NEUTROABS 4.5  --  3.8  HGB 8.3* 7.7* 8.1*  HCT 26.3* 23.5* 25.9*  MCV 88.3 85.5 88.4  PLT 302 250 263   Basic Metabolic Panel: Recent Labs  Lab 07/21/22 2001 07/22/22 0631 07/23/22 1044  NA 146* 144 142  K 3.7 3.6 3.7  CL 117* 120* 117*  CO2 24 22 23   GLUCOSE 87 85 88  BUN 19 16 15   CREATININE 1.47* 1.17 1.20  CALCIUM 9.8 9.5 9.3  MG 2.0  --   --    GFR: Estimated Creatinine Clearance: 43.2 mL/min (by C-G formula based on SCr of 1.2 mg/dL). Liver Function Tests: Recent Labs  Lab 07/23/22 1043  AST 18  ALT 11  ALKPHOS 68  BILITOT 0.6  PROT 5.6*  ALBUMIN 2.2*   No results for input(s): "LIPASE", "AMYLASE" in the last 168 hours. No results for input(s): "AMMONIA" in the last 168 hours. Coagulation Profile: No results for input(s): "INR", "PROTIME" in the last 168 hours. Cardiac Enzymes: Recent Labs  Lab 07/23/22 1043  CKTOTAL 75   BNP (last 3 results) No results for input(s): "PROBNP" in the last 8760 hours. HbA1C: No results for input(s): "HGBA1C" in the last 72 hours. CBG: Recent Labs  Lab 07/23/22 0354 07/23/22 0408 07/23/22 0757 07/23/22 1125 07/23/22 1705  GLUCAP 69* 72 92 83 81   Lipid Profile: No results for input(s): "CHOL", "HDL", "LDLCALC", "TRIG", "CHOLHDL", "LDLDIRECT" in the last 72 hours. Thyroid Function Tests: No results for input(s): "TSH", "T4TOTAL", "FREET4", "T3FREE", "THYROIDAB" in the last 72 hours. Anemia Panel: Recent Labs     07/22/22 0827  VITAMINB12 610  FOLATE 14.3  FERRITIN 211  TIBC 185*  IRON 39*   Sepsis Labs: Recent Labs  Lab 07/21/22 2331 07/22/22 0827  PROCALCITON  --  <0.10  LATICACIDVEN 1.8  --     Recent Results (from the past 240 hour(s))  Urine Culture     Status: None   Collection Time: 07/21/22 10:45 PM   Specimen: In/Out Cath Urine  Result Value Ref Range Status   Specimen Description   Final    IN/OUT CATH URINE Performed at [facility], [address] [place], [address] [place]    Special Requests   Final    NONE Performed at [facility], [address], [place], [address] [place]    Culture   Final    NO GROWTH Performed at [facility], [address], [place], [address] [place]    Report Status 07/23/2022 FINAL  Final  Resp Panel by RT-PCR (Flu A&B, Covid) Anterior Nasal Swab     Status: None   Collection Time: 07/21/22 10:52 PM   Specimen: Anterior Nasal Swab  Result Value Ref Range Status   SARS Coronavirus  2 by RT PCR NEGATIVE NEGATIVE Final    Comment: (NOTE) SARS-CoV-2 target nucleic acids are NOT DETECTED.  The SARS-CoV-2 RNA is generally detectable in upper respiratory specimens during the acute phase of infection. The lowest concentration of SARS-CoV-2 viral copies this assay can detect is 138 copies/mL. A negative result does not preclude SARS-Cov-2 infection and should not be used as the sole basis for treatment or other patient management decisions. A negative result may occur with  improper specimen collection/handling, submission of specimen other than nasopharyngeal swab, presence of viral mutation(s) within the areas targeted by this assay, and inadequate number of viral copies(<138 copies/mL). A negative result must be combined with clinical observations, patient history, and epidemiological information. The expected result is Negative.  Fact Sheet for Patients:  BloggerCourse.com  Fact  Sheet for Healthcare Providers:  SeriousBroker.it  This test is no t yet approved or cleared by the [place] FDA and  has been authorized for detection and/or diagnosis of SARS-CoV-2 by FDA under an Emergency Use Authorization (EUA). This EUA will remain  in effect (meaning this test can be used) for the duration of the COVID-19 declaration under Section 564(b)(1) of the Act, 21 U.S.C.section 360bbb-3(b)(1), unless the authorization is terminated  or revoked sooner.       Influenza A by PCR NEGATIVE NEGATIVE Final   Influenza B by PCR NEGATIVE NEGATIVE Final    Comment: (NOTE) The Xpert Xpress SARS-CoV-2/FLU/RSV plus assay is intended as an aid in the diagnosis of influenza from Nasopharyngeal swab specimens and should not be used as a sole basis for treatment. Nasal washings and aspirates are unacceptable for Xpert Xpress SARS-CoV-2/FLU/RSV testing.  Fact Sheet for Patients: BloggerCourse.com  Fact Sheet for Healthcare Providers: SeriousBroker.it  This test is not yet approved or cleared by the [place] FDA and has been authorized for detection and/or diagnosis of SARS-CoV-2 by FDA under an Emergency Use Authorization (EUA). This EUA will remain in effect (meaning this test can be used) for the duration of the COVID-19 declaration under Section 564(b)(1) of the Act, 21 U.S.C. section 360bbb-3(b)(1), unless the authorization is terminated or revoked.  Performed at [facility], [address], [place], [place] [address]   Blood Culture (routine x 2)     Status: Abnormal (Preliminary result)   Collection Time: 07/21/22 11:31 PM   Specimen: BLOOD RIGHT ARM  Result Value Ref Range Status   Specimen Description   Final    BLOOD RIGHT ARM Performed at [facility], [address], [place], [place] [address]    Special Requests   Final    Blood Culture adequate  volume Performed at [facility], [address] [place], [place] [address]    Culture  Setup Time   Final    GRAM POSITIVE COCCI ANAEROBIC BOTTLE ONLY CRITICAL RESULT CALLED TO, READ BACK BY AND VERIFIED WITH: ANDERSON MOORE 07/22/22 @ 1446 BY SB GRAM POSITIVE RODS AEROBIC BOTTLE ONLY CRITICAL RESULT CALLED TO, READ BACK BY AND VERIFIED WITH: PHARMD J. ROBINS 07/24/22 @ 0516 BY AB    Culture (A)  Final    ENTEROCOCCUS FAECIUM SUSCEPTIBILITIES TO FOLLOW Performed at [facility], [address] [place], [place] [address]    Report Status PENDING  Incomplete  Blood Culture (routine x 2)     Status: None (Preliminary result)   Collection Time: 07/21/22 11:31 PM   Specimen: BLOOD RIGHT ARM  Result Value Ref Range Status   Specimen Description BLOOD  RIGHT ARM  Final   Special Requests Blood Culture adequate volume  Final   Culture   Final    NO GROWTH 3 DAYS Performed at [facility], [address] [place], [place] [address]    Report Status PENDING  Incomplete  Blood Culture ID Panel (Reflexed)     Status: Abnormal   Collection Time: 07/21/22 11:31 PM  Result Value Ref Range Status   Enterococcus faecalis NOT DETECTED NOT DETECTED Final   Enterococcus Faecium DETECTED (A) NOT DETECTED Final    Comment: CRITICAL RESULT CALLED TO, READ BACK BY AND VERIFIED WITH: Delight StareNDERSON MOORE 07/22/22 @ 1446 BY SB    Listeria monocytogenes NOT DETECTED NOT DETECTED Final   Staphylococcus species NOT DETECTED NOT DETECTED Final   Staphylococcus aureus (BCID) NOT DETECTED NOT DETECTED Final   Staphylococcus epidermidis NOT DETECTED NOT DETECTED Final   Staphylococcus lugdunensis NOT DETECTED NOT DETECTED Final   Streptococcus species NOT DETECTED NOT DETECTED Final   Streptococcus agalactiae NOT DETECTED NOT DETECTED Final   Streptococcus pneumoniae NOT DETECTED NOT DETECTED Final   Streptococcus pyogenes NOT DETECTED NOT DETECTED Final   A.calcoaceticus-baumannii NOT  DETECTED NOT DETECTED Final   Bacteroides fragilis NOT DETECTED NOT DETECTED Final   Enterobacterales NOT DETECTED NOT DETECTED Final   Enterobacter cloacae complex NOT DETECTED NOT DETECTED Final   Escherichia coli NOT DETECTED NOT DETECTED Final   Klebsiella aerogenes NOT DETECTED NOT DETECTED Final   Klebsiella oxytoca NOT DETECTED NOT DETECTED Final   Klebsiella pneumoniae NOT DETECTED NOT DETECTED Final   Proteus species NOT DETECTED NOT DETECTED Final   Salmonella species NOT DETECTED NOT DETECTED Final   Serratia marcescens NOT DETECTED NOT DETECTED Final   Haemophilus influenzae NOT DETECTED NOT DETECTED Final   Neisseria meningitidis NOT DETECTED NOT DETECTED Final   Pseudomonas aeruginosa NOT DETECTED NOT DETECTED Final   Stenotrophomonas maltophilia NOT DETECTED NOT DETECTED Final   Candida albicans NOT DETECTED NOT DETECTED Final   Candida auris NOT DETECTED NOT DETECTED Final   Candida glabrata NOT DETECTED NOT DETECTED Final   Candida krusei NOT DETECTED NOT DETECTED Final   Candida parapsilosis NOT DETECTED NOT DETECTED Final   Candida tropicalis NOT DETECTED NOT DETECTED Final   Cryptococcus neoformans/gattii NOT DETECTED NOT DETECTED Final   Vancomycin resistance DETECTED (A) NOT DETECTED Final    Comment: CRITICAL RESULT CALLED TO, READ BACK BY AND VERIFIED WITH: Delight StareNDERSON MOORE 07/22/22 @ 1446 BY SB Performed at [facility], [address] [place], [place] [address]   Culture, blood (Routine X 2) w Reflex to ID Panel     Status: None (Preliminary result)   Collection Time: 07/23/22  8:16 PM   Specimen: BLOOD  Result Value Ref Range Status   Specimen Description BLOOD BLOOD RIGHT HAND [facility]  Final   Special Requests   Final    BOTTLES DRAWN AEROBIC AND ANAEROBIC Blood Culture adequate volume   Culture   Final    NO GROWTH < 12 HOURS Performed at [facility], [address] [place], [place] [address]    Report Status PENDING  Incomplete   Culture, blood (Routine X 2) w Reflex to ID Panel     Status: None (Preliminary result)   Collection Time: 07/23/22  8:17 PM   Specimen: BLOOD  Result Value Ref Range Status   Specimen Description BLOOD BLOOD LEFT ARM LAC  Final   Special Requests   Final    BOTTLES DRAWN AEROBIC AND ANAEROBIC  Blood Culture adequate volume   Culture   Final    NO GROWTH < 12 HOURS Performed at [facility], [address] [place], [place] [address]    Report Status PENDING  Incomplete  Culture, blood (Routine X 2) w Reflex to ID Panel     Status: None (Preliminary result)   Collection Time: 07/24/22  1:55 AM   Specimen: BLOOD  Result Value Ref Range Status   Specimen Description BLOOD BLOOD LEFT FOREARM  Final   Special Requests Blood Culture adequate volume  Final   Culture   Final    NO GROWTH < 12 HOURS Performed at [facility], [address], [place], [place] [address]    Report Status PENDING  Incomplete  Culture, blood (Routine X 2) w Reflex to ID Panel     Status: None (Preliminary result)   Collection Time: 07/24/22  1:55 AM   Specimen: BLOOD  Result Value Ref Range Status   Specimen Description BLOOD BLOOD RIGHT HAND  Final   Special Requests Blood Culture adequate volume  Final   Culture   Final    NO GROWTH < 12 HOURS Performed at [facility], [address], [place], [place] [address]    Report Status PENDING  Incomplete         Radiology Studies: CT ABDOMEN PELVIS W CONTRAST  Result Date: 07/23/2022 CLINICAL DATA:  Acute onset hypotension with decreased urinary output. Abscess. EXAM: CT ABDOMEN AND PELVIS WITH CONTRAST TECHNIQUE: Multidetector CT imaging of the abdomen and pelvis was performed using the standard protocol following bolus administration of intravenous contrast. RADIATION DOSE REDUCTION: This exam was performed according to the departmental dose-optimization program which includes automated exposure control, adjustment of the mA  and/or kV according to patient size and/or use of iterative reconstruction technique. CONTRAST:  OMNIPAQUE IOHEXOL 300 MG/ML  SOLN COMPARISON:  07/13/2022. FINDINGS: Lower chest: New small right pleural effusion. Moderate left pleural effusion, increased. Compressive atelectasis in both lower lobes. Atherosclerotic calcification of the aortic valve and coronary arteries. Heart is at the upper limits of normal in size to mildly enlarged. No pericardial effusion. Distal esophagus is grossly unremarkable. Hepatobiliary: Probable subcentimeter cysts in the liver. Liver is otherwise unremarkable. Stone in the gallbladder. Small pericholecystic fluid. No biliary ductal dilatation. Pancreas: 1.4 cm low-attenuation lesion in the pancreatic body, stable. No gland atrophy or ductal dilatation. Spleen: Negative. Adrenals/Urinary Tract: Renal parenchymal thinning bilaterally. Subcentimeter low-attenuation lesion in the left kidney. No specific follow-up necessary. Kidneys are otherwise unremarkable. Ureters are decompressed. Foley catheter is seen in a decompressed bladder. Bladder wall may be thickened. Stomach/Bowel: Stomach and majority of the small bowel are unremarkable. 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 Persistent rectal wall thickening with presacral edema and fluid, as on 07/13/2022. Vascular/Lymphatic: Retroaortic left renal vein. Atherosclerotic calcification of the aorta. Fusiform dilatation of the celiac trunk, 10 mm. No pathologically enlarged lymph nodes. Reproductive: Prostate is visualized. Other: No additional free fluid. Well-circumscribed fluid collection in the ventral right lower quadrant measures 2.2 x 3.7 cm (2/45), previously 2.4 x 4.5 cm on 07/13/2022, at which time a percutaneous drain was in place. No additional evidence of an abscess. Diffuse body wall edema. Musculoskeletal: Left hip arthroplasty. Right hip osteoarthritis. Degenerative changes in the spine. Grade 1  anterolisthesis of L5 on S1. IMPRESSION: 1. Residual abscess in the right lower quadrant, slightly smaller than on 07/13/2022. Interval percutaneous drain removal. 2. New small right pleural effusion. Moderate left  pleural effusion, increased. Compressive atelectasis in both lower lobes. 3. Persistent rectal wall thickening with extensive presacral edema/fluid, unchanged. 4. Bladder wall thickening, as before. 5. Diffuse body wall edema. 6. Cholelithiasis. 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 8. 1.4 cm cystic lesion in body of the pancreas, as before. Given patient condition and age, no specific follow-up is recommended other than on routine imaging. 9. Aortic atherosclerosis (ICD10-I70.0). Coronary artery calcification. Electronically Signed   By: Leanna Battles M.D.   On: 07/23/2022 15:34        Scheduled Meds:  Chlorhexidine Gluconate Cloth  6 each Topical Daily   enoxaparin (LOVENOX) injection  40 mg Subcutaneous Q24H   finasteride  5 mg Oral Daily   sertraline  25 mg Oral Daily   tamsulosin  0.4 mg Oral Daily   Continuous Infusions:  linezolid (ZYVOX) IV 600 mg (07/24/22 0947)     LOS: 1 day    Tresa Moore, MD [facility]   If 7PM-7AM, please contact night-coverage  07/24/2022, 12:43 PM

## 2022-07-24 NOTE — Evaluation (Signed)
Occupational Therapy Evaluation Patient Details Name: Carl Figueroa MRN: [id number] DOB: 06-02-1946 Today's Date: 07/24/2022   History of Present Illness Carl Figueroa is a 76 y.o. male with medical history significant for essential hypertension, anxiety and Lewy body dementia as well as osteoarthritis, who presented to the emergency room with acute onset of hypotension and diminished urinary output.  He admits to urinary frequency and dysuria without hematuria or flank pain.  The patient had a recent hospitalization for appendectomy that was complicated and he underwent colostomy. Pt has Foley cath.   Clinical Impression   Pt agreeable to OT/PT co-treatment to maximize safety and participation. Pt oriented to self only. Pt poor historian 2/2 baseline cognitive deficits. Spoke with wife on phone to obtain PLOF. Prior to admission, pt was receiving assistance from wife for all ADLs/IADLs and has been using power w/c for functional mobility. During evaluation, pt required Min-Mod A for bed mobility, Mod A +2 to stand from EOB, and Max A for all self-care tasks. Pt required multimodal cues throughout for safety and sequencing. Pt reporting dizziness upon standing and was returned to EOB. BP in sitting: 86/48 (MAP 59). Dizziness not improving so returned pt to supine for safety. BP in supine: 96/66 (MAP 76). Team updated and aware. Pt would benefit from [facility] in familiar environment with 24/7 assistance and a slide board for transfers. If family unable to provide recommended assistance, recommend STR.      Recommendations for follow up therapy are one component of a multi-disciplinary discharge planning process, led by the attending physician.  Recommendations may be updated based on patient status, additional functional criteria and insurance authorization.   Follow Up Recommendations  Home health OT ([facility] services - PT/OT/aide and slide board)     Assistance Recommended at Discharge  Frequent or constant Supervision/Assistance  Patient can return home with the following Two people to help with walking and/or transfers;Two people to help with bathing/dressing/bathroom;Assistance with cooking/housework;Assist for transportation;Help with stairs or ramp for entrance;Assistance with feeding;Direct supervision/assist for medications management;Direct supervision/assist for financial management    Functional Status Assessment  Patient has had a recent decline in their functional status and demonstrates the ability to make significant improvements in function in a reasonable and predictable amount of time.  Equipment Recommendations  Other (comment) (sliding board)    Recommendations for Other Services       Precautions / Restrictions Precautions Precautions: Fall Precaution Comments: ostomy bag, foley Restrictions Weight Bearing Restrictions: No      Mobility Bed Mobility Overal bed mobility: Needs Assistance Bed Mobility: Supine to Sit, Sit to Supine     Supine to sit: Min assist, Mod assist Sit to supine: Min assist   General bed mobility comments: VC for sequencing    Transfers Overall transfer level: Needs assistance Equipment used: Rolling walker (2 wheels) Transfers: Sit to/from Stand Sit to Stand: Mod assist, +2 physical assistance                  Balance Overall balance assessment: Needs assistance Sitting-balance support: Feet supported, Bilateral upper extremity supported Sitting balance-Leahy Scale: Poor Sitting balance - Comments: CGA-MIN A throughout Postural control: Right lateral lean Standing balance support: Bilateral upper extremity supported Standing balance-Leahy Scale: Poor Standing balance comment: Increased ddizziness and poor postural alignment.                           ADL either performed or assessed with clinical  judgement   ADL Overall ADL's : Needs assistance/impaired     Grooming: Maximal  assistance;Sitting           Upper Body Dressing : Maximal assistance;Sitting   Lower Body Dressing: Maximal assistance;Sitting/lateral leans       Toileting- Clothing Manipulation and Hygiene: Total assistance Toileting - Clothing Manipulation Details (indicate cue type and reason): ostomy care             Vision Patient Visual Report: No change from baseline       Perception     Praxis      Pertinent Vitals/Pain Pain Assessment Pain Assessment: Faces Faces Pain Scale: Hurts a little bit Pain Location: bottom Pain Descriptors / Indicators: Sore Pain Intervention(s): Monitored during session, Repositioned     Hand Dominance Right   Extremity/Trunk Assessment Upper Extremity Assessment Upper Extremity Assessment: Generalized weakness   Lower Extremity Assessment Lower Extremity Assessment: Generalized weakness       Communication Communication Communication: No difficulties   Cognition Arousal/Alertness: Awake/alert Behavior During Therapy: Flat affect Overall Cognitive Status: History of cognitive impairments - at baseline Area of Impairment: Orientation, Safety/judgement, Memory, Problem solving, Attention, Following commands, Awareness                 Orientation Level: Disoriented to, Time, Situation, Place Current Attention Level: Focused Memory: Decreased recall of precautions, Decreased short-term memory Following Commands: Follows one step commands with increased time Safety/Judgement: Decreased awareness of safety, Decreased awareness of deficits Awareness: Intellectual Problem Solving: Slow processing, Decreased initiation, Difficulty sequencing, Requires verbal cues, Requires tactile cues General Comments: History of lewy body dementia. Pt oriented to self only; reported current year in 1998, hospital is [facility], and month is October     General Comments  decubitus ulcer on sacrum. Pt reporting dizziness upon standing, returned to EOB.  BP in sitting: 86/48 (MAP 59). Dizziness not improving so returned pt to supine for safety. BP in supine: 96/66 (MAP 76).    Exercises Other Exercises Other Exercises: OT provided education re: role of OT, OT POC, post acute recs, sitting up for all meals, EOB/OOB mobility with assistance, home/fall safety.     Shoulder Instructions      Home Living Family/patient expects to be discharged to:: Private residence Living Arrangements: Spouse/significant other Available Help at Discharge: Family Type of Home: House Home Access: Ramped entrance     Home Layout: One level     Bathroom Shower/Tub: Sponge bathes at baseline         Home Equipment: Wheelchair - power;Insurance risk surveyor (2 wheels)   Additional Comments: per wife pt utlized RW, pwc as needed (but wants to start using Sliding board.)      Prior Functioning/Environment Prior Level of Function : Needs assist  Cognitive Assist : Mobility (cognitive) Mobility (Cognitive): Step by step cues   Physical Assist : Mobility (physical);ADLs (physical) Mobility (physical): Bed mobility;Transfers;Gait ADLs (physical): Grooming;Bathing;Dressing;Toileting;IADLs;Feeding Mobility Comments: Per wife, pt utilized RW before recent hospitalizations and pwc PRN. Now pt uses power w/c at home and needs assistance with bed mobility and transfers. Pt is non-ambulatory since last hospitalization. ADLs Comments: Wife assist with all ADLs/IADLs        OT Problem List: Decreased strength;Impaired balance (sitting and/or standing);Decreased activity tolerance;Decreased safety awareness;Decreased cognition;Decreased knowledge of precautions;Decreased knowledge of use of DME or AE;Pain      OT Treatment/Interventions: Self-care/ADL training;Patient/family education;Therapeutic exercise;Balance training;Energy conservation;Therapeutic activities;DME and/or AE instruction;Cognitive remediation/compensation    OT Goals(Current goals  can  be found in the care plan section) Acute Rehab OT Goals Patient Stated Goal: wife wants to take pt home OT Goal Formulation: With family Time For Goal Achievement: 08/07/22 Potential to Achieve Goals: Fair   OT Frequency: Min 2X/week    Co-evaluation PT/OT/SLP Co-Evaluation/Treatment: Yes Reason for Co-Treatment: For patient/therapist safety;Necessary to address cognition/behavior during functional activity;To address functional/ADL transfers PT goals addressed during session: Mobility/safety with mobility;Balance;Proper use of DME OT goals addressed during session: ADL's and self-care      AM-PAC OT "6 Clicks" Daily Activity     Outcome Measure Help from another person eating meals?: A Little Help from another person taking care of personal grooming?: A Lot Help from another person toileting, which includes using toliet, bedpan, or urinal?: Total Help from another person bathing (including washing, rinsing, drying)?: A Lot Help from another person to put on and taking off regular upper body clothing?: A Lot Help from another person to put on and taking off regular lower body clothing?: A Lot 6 Click Score: 12   End of Session Equipment Utilized During Treatment: Gait belt;Rolling walker (2 wheels) Nurse Communication: Mobility status;Other (comment) (low BP)  Activity Tolerance: Patient tolerated treatment well;Patient limited by fatigue Patient left: in bed;with call bell/phone within reach;with bed alarm set  OT Visit Diagnosis: Unsteadiness on feet (R26.81);Muscle weakness (generalized) (M62.81);Other symptoms and signs involving cognitive function;Pain                Time: 1101-1136 OT Time Calculation (min): 35 min Charges:  OT General Charges $OT Visit: 1 Visit OT Evaluation $OT Eval Moderate Complexity: 1 Mod  [facility] MS, OTR/L ascom [phone]  07/24/22, 2:59 PM

## 2022-07-24 NOTE — Progress Notes (Signed)
PHARMACY - PHYSICIAN COMMUNICATION CRITICAL VALUE ALERT - BLOOD CULTURE IDENTIFICATION (BCID)  Carl Figueroa is an 76 y.o. male who presented to [facility] on 07/21/2022 with a chief complaint of AKI, decubitus ulcer.   Assessment:  Gram positive rods in 1 of 6 bottles  (include suspected source if known)  Name of physician (or Provider) Contacted: Bishop Limbo, NP   Current antibiotics: Zyvox 600 mg IV X Q24H   Changes to prescribed antibiotics recommended:  ID currently following, NP will defer any changes to current abx to ID team.   Results for orders placed or performed during the hospital encounter of 07/21/22  Blood Culture ID Panel (Reflexed) (Collected: 07/21/2022 11:31 PM)  Result Value Ref Range   Enterococcus faecalis NOT DETECTED NOT DETECTED   Enterococcus Faecium DETECTED (A) NOT DETECTED   Listeria monocytogenes NOT DETECTED NOT DETECTED   Staphylococcus species NOT DETECTED NOT DETECTED   Staphylococcus aureus (BCID) NOT DETECTED NOT DETECTED   Staphylococcus epidermidis NOT DETECTED NOT DETECTED   Staphylococcus lugdunensis NOT DETECTED NOT DETECTED   Streptococcus species NOT DETECTED NOT DETECTED   Streptococcus agalactiae NOT DETECTED NOT DETECTED   Streptococcus pneumoniae NOT DETECTED NOT DETECTED   Streptococcus pyogenes NOT DETECTED NOT DETECTED   A.calcoaceticus-baumannii NOT DETECTED NOT DETECTED   Bacteroides fragilis NOT DETECTED NOT DETECTED   Enterobacterales NOT DETECTED NOT DETECTED   Enterobacter cloacae complex NOT DETECTED NOT DETECTED   Escherichia coli NOT DETECTED NOT DETECTED   Klebsiella aerogenes NOT DETECTED NOT DETECTED   Klebsiella oxytoca NOT DETECTED NOT DETECTED   Klebsiella pneumoniae NOT DETECTED NOT DETECTED   Proteus species NOT DETECTED NOT DETECTED   Salmonella species NOT DETECTED NOT DETECTED   Serratia marcescens NOT DETECTED NOT DETECTED   Haemophilus influenzae NOT DETECTED NOT DETECTED   Neisseria meningitidis NOT  DETECTED NOT DETECTED   Pseudomonas aeruginosa NOT DETECTED NOT DETECTED   Stenotrophomonas maltophilia NOT DETECTED NOT DETECTED   Candida albicans NOT DETECTED NOT DETECTED   Candida auris NOT DETECTED NOT DETECTED   Candida glabrata NOT DETECTED NOT DETECTED   Candida krusei NOT DETECTED NOT DETECTED   Candida parapsilosis NOT DETECTED NOT DETECTED   Candida tropicalis NOT DETECTED NOT DETECTED   Cryptococcus neoformans/gattii NOT DETECTED NOT DETECTED   Vancomycin resistance DETECTED (A) NOT DETECTED    Annayah Worthley D 07/24/2022  6:11 AM

## 2022-07-24 NOTE — Progress Notes (Signed)
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- also had intra-abdominal abscesses for which he had a JP drain placed at [facility]- Pt is here with poor intake and poor urine output He is doing much better O/e awake Wife feeding him Patient Vitals for the past 24 hrs:  BP Temp Temp src Pulse Resp SpO2  07/24/22 1606 126/63 98.1 F (36.7 C) -- 69 16 100 %  07/24/22 0841 108/79 97.9 F (36.6 C) -- 65 14 98 %  07/24/22 0420 122/63 98.3 F (36.8 C) Oral 64 17 97 %    Chest b/l air entry [id number] Abd soft- ileostomy bag Foley Cns- moves all limbs  Labs    Latest Ref Rng & Units 07/23/2022   10:44 AM 07/22/2022    6:31 AM 07/21/2022    8:01 PM  CBC  WBC 4.0 - 10.5 K/uL 6.1  6.6  6.7   Hemoglobin 13.0 - 17.0 g/dL 8.1  7.7  8.3   Hematocrit 39.0 - 52.0 % 25.9  23.5  26.3   Platelets 150 - 400 K/uL 263  250  302        Latest Ref Rng & Units 07/23/2022   10:44 AM 07/23/2022   10:43 AM 07/22/2022    6:31 AM  CMP  Glucose 70 - 99 mg/dL 88   85   BUN 8 - 23 mg/dL 15   16   Creatinine 8.92 - 1.24 mg/dL 1.19   4.17   Sodium 408 - 145 mmol/L 142   144   Potassium 3.5 - 5.1 mmol/L 3.7   3.6   Chloride 98 - 111 mmol/L 117   120   CO2 22 - 32 mmol/L 23   22   Calcium 8.9 - 10.3 mg/dL 9.3   9.5   Total Protein 6.5 - 8.1 g/dL  5.6    Total Bilirubin 0.3 - 1.2 mg/dL  0.6    Alkaline Phos 38 - 126 U/L  68    AST 15 - 41 U/L  18    ALT 0 - 44 U/L  11      Micro 07/21/22- BC- anerobic bottle VRE Aerobic bottle gram positive rod  12/4 /2 BC X3 Ng  Impression/recommendation  Poor output and intake improved  ?VRE bacteremia- 1/4- hard to consider this as a contaminant especially with intraabdominal abscesses  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 and underwent colectomy in Oct/Nov at [facility]( records not available) and had intraabdominal abscess for  which he had a drain The culture on the abdominal fluid was enterococcus and candida- was R to ampicillin- looks like he was treated before but not sure of duraion The JP drain was removed on 11/27 inspite of a sizeable abscess in the abdomen  repeat Ct abdomen shows the abscess which is similar size to 11/24- difficult to place a drain but could be aspirated and sent for culture  Discussed with radiologist  Repeat blood culture- NG On linezolid- duration and kind of antibiotics depends on the duration we choose   Sacral decub stage -2 looks okay   Anemia Discussed the management with his wife

## 2022-07-24 NOTE — Evaluation (Signed)
Physical Therapy Evaluation Patient Details Name: Carl Figueroa MRN: [id number] DOB: 1946-06-23 Today's Date: 07/24/2022  History of Present Illness  Carl Figueroa is a 76 y.o. male with medical history significant for essential hypertension, anxiety and Lewy body dementia as well as osteoarthritis, who presented to the emergency room with acute onset of hypotension and diminished urinary output.  He admits to urinary frequency and dysuria without hematuria or flank pain.  The patient had a recent hospitalization for appendectomy that was complicated and he underwent colostomy. Pt has Foley cath.  Clinical Impression  Pt received in bed agreeable to participate in PT/OT co -eval. PT and OT communicated with wife via a phone call and found that Pt needs assistance with Mobility and ADLs at baseline since last hospitalization. Pt verbalized needs for home health assistance and sliding board. Pt presented with intermittent confusion during the session but follows simple, one step commands consistently with processing time. Pt abel to performed bed mobility with mod to min assist and STS with min to mod assist with Max assist to maintain standing balance. PT symptomatic with Change of position.  Supine 96/66 after exertion. BP: Sitting 86/48. Unable ot stand long enough 2/2 poor postural control and increased dizziness. Pt in bed as pt to go for a procedure shortly. PT will continue in acute and pt will benefit form HHPT/OT/Aide and a sliding board to improve functional mobility at household level and decrease caregiver burden.    Recommendations for follow up therapy are one component of a multi-disciplinary discharge planning process, led by the attending physician.  Recommendations may be updated based on patient status, additional functional criteria and insurance authorization.  Follow Up Recommendations Home health PT (HH max( PT/OT/Aide and siding board)) Can patient physically be  transported by private vehicle: No    Assistance Recommended at Discharge Frequent or constant Supervision/Assistance  Patient can return home with the following  Two people to help with walking and/or transfers;Two people to help with bathing/dressing/bathroom;Help with stairs or ramp for entrance;Direct supervision/assist for medications management;Assistance with feeding;Assist for transportation;Assistance with cooking/housework;Direct supervision/assist for financial management    Equipment Recommendations Other (comment) (Sliding board)  Recommendations for Other Services       Functional Status Assessment Patient has had a recent decline in their functional status and demonstrates the ability to make significant improvements in function in a reasonable and predictable amount of time.     Precautions / Restrictions Precautions Precautions: Fall Precaution Comments: ostomy bag, foley Restrictions Weight Bearing Restrictions: No      Mobility  Bed Mobility Overal bed mobility: Needs Assistance Bed Mobility: Supine to Sit, Sit to Supine     Supine to sit: Min assist, Mod assist (with Max VC for sequencing) Sit to supine: Min assist (with VC for sequencing)   General bed mobility comments: with Max VC for sequencing Patient Response: Cooperative, Flat affect  Transfers Overall transfer level: Needs assistance Equipment used: Rolling walker (2 wheels) Transfers: Sit to/from Stand Sit to Stand: Mod assist, +2 physical assistance           General transfer comment: deferred from chair transfer due to low BP and pt going for a procedure shortly.    Ambulation/Gait               General Gait Details: unsafe due to low BP and dizziness with poor posuitral control in sitting nad standing.  Stairs: N/A, Ramped entrance  Wheelchair Mobility: Pt has motorized chair     Modified Rankin (Stroke Patients Only)       Balance Overall balance  assessment: Needs assistance Sitting-balance support: Feet supported, Bilateral upper extremity supported Sitting balance-Leahy Scale: Poor (imporves with time but remans fair.) Sitting balance - Comments: CGA-MIN A throughout Postural control: Right lateral lean Standing balance support: Bilateral upper extremity supported Standing balance-Leahy Scale: Poor Standing balance comment: Increased ddizziness and poor postural alignment.                             Pertinent Vitals/Pain Pain Assessment Pain Assessment: No/denies pain    Home Living Family/patient expects to be discharged to:: Private residence Living Arrangements: Spouse/significant other Available Help at Discharge: Family Type of Home: House Home Access: Ramped entrance       Home Layout: One level   Additional Comments: per wife pt utlized RW, pwc as needed (but wants to start using Sliding board.)    Prior Function Prior Level of Function : Needs assist  Cognitive Assist : Mobility (cognitive) Mobility (Cognitive): Step by step cues   Physical Assist : Mobility (physical);ADLs (physical) Mobility (physical): Bed mobility;Transfers;Gait ADLs (physical): Feeding;Grooming;Bathing;Dressing;Toileting;IADLs Mobility Comments: As per Wife, Pt uses motorized w/c at home and needs assistance with bed mobility and transfers. Pt is non ambulatory since last hospitalization. ADLs Comments: Needs assistance.     Hand Dominance        Extremity/Trunk Assessment   Upper Extremity Assessment Upper Extremity Assessment: Defer to OT evaluation;Generalized weakness    Lower Extremity Assessment Lower Extremity Assessment: Generalized weakness       Communication   Communication: No difficulties;Other (comment) (can become confused intermittently.)  Cognition Arousal/Alertness: Awake/alert Behavior During Therapy: Flat affect Overall Cognitive Status: History of cognitive impairments - at  baseline Area of Impairment: Orientation, Safety/judgement, Memory, Problem solving                 Orientation Level: Disoriented to, Person, Time, Situation Current Attention Level: Focused Memory: Decreased recall of precautions, Decreased short-term memory Following Commands: Follows one step commands consistently Safety/Judgement: Decreased awareness of safety, Decreased awareness of deficits   Problem Solving: Slow processing, Decreased initiation, Difficulty sequencing, Requires verbal cues          General Comments General comments (skin integrity, edema, etc.): Decubitis in Inf Sacrum    Exercises     Assessment/Plan    PT Assessment Patient needs continued PT services  PT Problem List Decreased strength;Decreased coordination;Cardiopulmonary status limiting activity;Decreased range of motion;Decreased cognition;Decreased activity tolerance;Decreased knowledge of use of DME;Decreased balance;Decreased safety awareness;Decreased mobility       PT Treatment Interventions DME instruction;Therapeutic exercise;Gait training;Balance training;Stair training;Neuromuscular re-education;Functional mobility training;Patient/family education;Therapeutic activities;Cognitive remediation;Wheelchair mobility training    PT Goals (Current goals can be found in the Care Plan section)  Acute Rehab PT Goals Patient Stated Goal: to get better and bring pt home with help. PT Goal Formulation: With family Time For Goal Achievement: 08/07/22 Potential to Achieve Goals: Fair    Frequency Min 2X/week     Co-evaluation PT/OT/SLP Co-Evaluation/Treatment: Yes Reason for Co-Treatment: For patient/therapist safety;Complexity of the patient's impairments (multi-system involvement);Necessary to address cognition/behavior during functional activity PT goals addressed during session: Mobility/safety with mobility;Balance;Proper use of DME;Strengthening/ROM         AM-PAC PT "6 Clicks"  Mobility  Outcome Measure Help needed turning from your back to your side while in a flat bed without  using bedrails?: A Little Help needed moving from lying on your back to sitting on the side of a flat bed without using bedrails?: A Lot Help needed moving to and from a bed to a chair (including a wheelchair)?: A Lot Help needed standing up from a chair using your arms (e.g., wheelchair or bedside chair)?: A Lot Help needed to walk in hospital room?: A Lot Help needed climbing 3-5 steps with a railing? : A Lot 6 Click Score: 13    End of Session         PT Visit Diagnosis: Muscle weakness (generalized) (M62.81);Difficulty in walking, not elsewhere classified (R26.2);Other abnormalities of gait and mobility (R26.89);Pain (with sacral sitting during supine to sit.)    Time: 8101-7510 PT Time Calculation (min) (ACUTE ONLY): 34 min   Charges:   PT Evaluation $PT Eval Low Complexity: 1 Low PT Treatments $Therapeutic Activity: 8-22 mins      Carl Figueroa PT DPT 1:18 PM,07/24/22

## 2022-07-24 NOTE — TOC Progression Note (Signed)
Transition of Care [facility]) - Progression Note    Patient Details  Name: Carl Figueroa MRN: [id number] Date of Birth: [birth year]  Transition of Care [facility]) CM/SW Contact  Tempie Hoist, [place] Phone Number: 07/24/2022, 12:55 PM  Clinical Narrative:     Patient needs resumption of PT, OT, RN and Home Health Aide from [facility]. Patient receives Home Health from [place].  Patient needs a sliding board from [facility]. TOC contacting [facility].    Barriers to Discharge: Barriers Resolved  Expected Discharge Plan and Services           Expected Discharge Date: 07/23/22               DME Arranged: N/A DME Agency: NA       HH Arranged: RN, PT, OT, Social Work [employer] Agency: [facility] Date HH Agency Contacted: 07/23/22 Time HH Agency Contacted: [phone] Representative spoke with at [facility] Agency: Barbara Cower   Social Determinants of Health (SDOH) Interventions    Readmission Risk Interventions     No data to display

## 2022-07-24 NOTE — Progress Notes (Addendum)
Initial Nutrition Assessment  DOCUMENTATION CODES:   Severe malnutrition in context of chronic illness  INTERVENTION:   -Ensure Enlive po TID, each supplement provides 350 kcal and 20 grams of protein.  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  GOAL:   Patient will meet greater than or equal to 90% of their needs  MONITOR:   PO intake, Supplement acceptance  REASON FOR ASSESSMENT:   Malnutrition Screening Tool    ASSESSMENT:   Pt with history of hypertension, anxiety, Lewy body dementia, osteoarthritis who presents with acute onset of hypotension associated with diminished urinary output. I had a conversation with the patient's wife at bedside today. She states over the last 2 to 3 days he has had decreasing level of mentation, increasing confusion, decreased oral intake and decreased urinary output.  Pt admitted with AKI and VRE bacteremia.   Reviewed I/O's: -907 ml x 24 hours and +2.1 L since admission  UOP: 1.2 L x 24 hours   Per MD notes, plan for drain placement for intraabdominal abscess tomorrow on 07/25/22. Pt also on IV antibiotics.   No family at bedside. Pt somnolent at time of visit and did not arouse to voice or touch.   Reviewed wt hx; pt has experienced a 23% wt loss over the past 6 months, which is significant for time frame.   Per TOC notes, plan to discharge home with home health services once medically stable.   Medications reviewed and include zyvox.   Labs reviewed: CBGS: 81.   NUTRITION - FOCUSED PHYSICAL EXAM:  Flowsheet Row Most Recent Value  Orbital Region Moderate depletion  Upper Arm Region Severe depletion  Thoracic and Lumbar Region Moderate depletion  Buccal Region  Severe depletion  Temple Region Severe depletion  Clavicle Bone Region Severe depletion  Clavicle and Acromion Bone Region Severe depletion  Scapular Bone Region Severe depletion  Dorsal Hand Moderate depletion  Patellar Region Moderate depletion  Anterior Thigh Region Moderate depletion  Posterior Calf Region Moderate depletion  Edema (RD Assessment) None  Hair Reviewed  Eyes Reviewed  Mouth Reviewed  Skin Reviewed  Nails Reviewed       Diet Order:   Diet Order             Diet NPO time specified  Diet effective midnight           Diet regular Room service appropriate? 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  Estimated Nutritional Needs:   Kcal:  2050-2250  Protein:  115-130 grams  Fluid:  > 2 L  Loistine Chance, RD, LDN, [place] Registered Dietitian II Certified Diabetes Care and Education Specialist Please refer to [facility] for RD and/or RD on-call/weekend/after hours pager

## 2022-07-25 ENCOUNTER — Inpatient Hospital Stay: Payer: No Typology Code available for payment source

## 2022-07-25 DIAGNOSIS — K651 Peritoneal abscess: Secondary | ICD-10-CM | POA: Diagnosis not present

## 2022-07-25 DIAGNOSIS — Z1621 Resistance to vancomycin: Secondary | ICD-10-CM | POA: Diagnosis not present

## 2022-07-25 DIAGNOSIS — I959 Hypotension, unspecified: Secondary | ICD-10-CM

## 2022-07-25 DIAGNOSIS — D638 Anemia in other chronic diseases classified elsewhere: Secondary | ICD-10-CM | POA: Insufficient documentation

## 2022-07-25 DIAGNOSIS — N179 Acute kidney failure, unspecified: Secondary | ICD-10-CM | POA: Diagnosis not present

## 2022-07-25 DIAGNOSIS — N400 Enlarged prostate without lower urinary tract symptoms: Secondary | ICD-10-CM

## 2022-07-25 DIAGNOSIS — E43 Unspecified severe protein-calorie malnutrition: Secondary | ICD-10-CM

## 2022-07-25 DIAGNOSIS — B952 Enterococcus as the cause of diseases classified elsewhere: Secondary | ICD-10-CM | POA: Diagnosis not present

## 2022-07-25 DIAGNOSIS — R7881 Bacteremia: Secondary | ICD-10-CM | POA: Diagnosis not present

## 2022-07-25 MED ORDER — SODIUM CHLORIDE 0.9% FLUSH
5.0000 mL | Freq: Three times a day (TID) | INTRAVENOUS | Status: DC
  Administered 2022-07-25 – 2022-07-27 (×6): 5 mL

## 2022-07-25 MED ORDER — LIDOCAINE HCL (PF) 1 % IJ SOLN
10.0000 mL | Freq: Once | INTRAMUSCULAR | Status: AC
  Administered 2022-07-25: 10 mL

## 2022-07-25 MED ORDER — LINEZOLID 600 MG PO TABS
600.0000 mg | ORAL_TABLET | Freq: Two times a day (BID) | ORAL | Status: DC
  Administered 2022-07-25 – 2022-07-27 (×4): 600 mg via ORAL
  Filled 2022-07-25 (×4): qty 1

## 2022-07-25 MED ORDER — MIDAZOLAM HCL 2 MG/2ML IJ SOLN
INTRAMUSCULAR | Status: AC
  Filled 2022-07-25: qty 4

## 2022-07-25 MED ORDER — FENTANYL CITRATE (PF) 100 MCG/2ML IJ SOLN
INTRAMUSCULAR | Status: AC
  Filled 2022-07-25: qty 2

## 2022-07-25 MED ORDER — FENTANYL CITRATE (PF) 100 MCG/2ML IJ SOLN
INTRAMUSCULAR | Status: AC | PRN
  Administered 2022-07-25: 12.5 ug via INTRAVENOUS

## 2022-07-25 NOTE — Assessment & Plan Note (Addendum)
Interventional radiology procedure 12/6 drawing off some fluid from the abdomen and sending for culture.  Left a drain in place.  So far no organisms seen on culture.

## 2022-07-25 NOTE — Progress Notes (Signed)
Colostomy bag changed due to leakage. Measured 50cc output.

## 2022-07-25 NOTE — Hospital Course (Addendum)
76 year old male significant history of hypertension, anxiety, Lewy body dementia, osteoarthritis who presents to the ED with acute onset of hypotension associated with diminished urinary output. I had a conversation with the patient's wife at bedside today. She states over the last 2 to 3 days he has had decreasing level of mentation, increasing confusion, decreased oral intake and decreased urinary output. So she brought him to the emergency room. He does have evidence of acute kidney injury. His urinalysis is not indicative of infection. Admission status downgraded to observation. Will monitor overnight. Recheck 5 AM labs. If laboratory workup is stable patient will be discharged home in the care of his wife.     Case discussed with Dr. Rivka Safer infectious disease. Concerned that Enterococcus VCM noted 1/4 blood cultures could be translocation from previously noted intra-abdominal fluid collection. Patient had a recent admission in which CAT scan was done that showed a persistent but decreased in size 11.4 cm. (A fluid collection. Previously had abdominal drain in place. This abdominal drain was pulled by general surgery on previous admission. Has not had repeat imaging.    Repeat CT abdomen pelvis pursued.  Case discussed with radiology.  Fluid pocket around the same size as prior.  Interventional radiology consulted.  Plan for percutaneous drainage.  Abscess cavity too small to leave drain in place.  Will send fluid for culture and sensitivities. Patient on IV Zyvox.  07/26/2022.  Infectious disease recommended oral Zyvox for 1 more week 600 mg twice daily.  Unable to obtain this prescription at a local pharmacy and needs to go through the [place]. will need this prescription filled prior to being discharged.  Prescription obtained and stable for discharge.

## 2022-07-25 NOTE — Plan of Care (Signed)
  Problem: Fluid Volume: Goal: Hemodynamic stability will improve Outcome: Progressing   Problem: Clinical Measurements: Goal: Diagnostic test results will improve Outcome: Progressing Goal: Signs and symptoms of infection will decrease Outcome: Progressing   Problem: Respiratory: Goal: Ability to maintain adequate ventilation will improve Outcome: Progressing   Problem: Education: Goal: Knowledge of disease or condition will improve Outcome: Progressing Goal: Knowledge of secondary prevention will improve (MUST DOCUMENT ALL) Outcome: Progressing Goal: Knowledge of patient specific risk factors will improve (Mark N/A or DELETE if not current risk factor) Outcome: Progressing   Problem: Ischemic Stroke/TIA Tissue Perfusion: Goal: Complications of ischemic stroke/TIA will be minimized Outcome: Progressing   Problem: Coping: Goal: Will verbalize positive feelings about self Outcome: Progressing Goal: Will identify appropriate support needs Outcome: Progressing   Problem: Health Behavior/Discharge Planning: Goal: Ability to manage health-related needs will improve Outcome: Progressing Goal: Goals will be collaboratively established with patient/family Outcome: Progressing   Problem: Self-Care: Goal: Ability to participate in self-care as condition permits will improve Outcome: Progressing Goal: Verbalization of feelings and concerns over difficulty with self-care will improve Outcome: Progressing Goal: Ability to communicate needs accurately will improve Outcome: Progressing   Problem: Nutrition: Goal: Risk of aspiration will decrease Outcome: Progressing Goal: Dietary intake will improve Outcome: Progressing   Problem: Education: Goal: Knowledge of General Education information will improve Description: Including pain rating scale, medication(s)/side effects and non-pharmacologic comfort measures Outcome: Progressing   Problem: Health Behavior/Discharge  Planning: Goal: Ability to manage health-related needs will improve Outcome: Progressing   Problem: Clinical Measurements: Goal: Ability to maintain clinical measurements within normal limits will improve Outcome: Progressing Goal: Will remain free from infection Outcome: Progressing Goal: Diagnostic test results will improve Outcome: Progressing Goal: Respiratory complications will improve Outcome: Progressing Goal: Cardiovascular complication will be avoided Outcome: Progressing   Problem: Activity: Goal: Risk for activity intolerance will decrease Outcome: Progressing   Problem: Nutrition: Goal: Adequate nutrition will be maintained Outcome: Progressing   Problem: Coping: Goal: Level of anxiety will decrease Outcome: Progressing   Problem: Elimination: Goal: Will not experience complications related to bowel motility Outcome: Progressing Goal: Will not experience complications related to urinary retention Outcome: Progressing   Problem: Pain Managment: Goal: General experience of comfort will improve Outcome: Progressing   Problem: Safety: Goal: Ability to remain free from injury will improve Outcome: Progressing   Problem: Skin Integrity: Goal: Risk for impaired skin integrity will decrease Outcome: Progressing   

## 2022-07-25 NOTE — Assessment & Plan Note (Signed)
Continue po Zyvox.  Case discussed with infectious disease specialist.  1 more week of p.o. Zyvox 600 mg twice a day.  Repeat blood cultures negative for 2 days

## 2022-07-25 NOTE — Progress Notes (Signed)
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 Sitting in chair- wants to go home Had IR place a drain in the intraabdominal collection and culture of fluid sent   O/e awake Pleasantly confused with memory isses Patient Vitals for the past 24 hrs:  BP Temp Temp src Pulse Resp SpO2 Height  07/25/22 1015 130/81 -- -- -- 14 96 % --  07/25/22 1000 133/70 -- -- 62 14 92 % --  07/25/22 0955 (!) 139/95 -- -- (!) 53 15 100 % --  07/25/22 0950 -- -- -- -- 20 -- --  07/25/22 0945 -- -- -- 62 15 100 % --  07/25/22 0935 129/63 -- -- 66 18 100 % --  07/25/22 0930 137/64 -- -- 63 18 100 % --  07/25/22 0925 137/64 -- -- 62 14 100 % --  07/25/22 0920 123/63 -- -- (!) 57 15 99 % --  07/25/22 0920 -- -- -- -- 18 -- --  07/25/22 0915 -- -- -- (!) 58 15 100 % --  07/25/22 0910 -- -- -- (!) 55 15 100 % --  07/25/22 0905 -- -- -- (!) 51 14 100 % --  07/25/22 0900 -- -- -- (!) 50 15 100 % --  07/25/22 0858 123/63 -- -- (!) 52 15 100 % --  07/25/22 0838 123/63 97.7 F (36.5 C) Oral (!) 54 12 96 % 5\' 7"  (1.702 m)  07/25/22 0512 (!) 119/58 (!) 97.4 F (36.3 C) Oral 62 18 100 % --  07/24/22 2125 118/60 98.6 F (37 C) Oral 70 17 99 % --  07/24/22 1606 126/63 98.1 F (36.7 C) -- 69 16 100 % --    Chest b/l air entry [id number] Abd soft- ileostomy bag Rt upper quadrant/ near ileostomy  JP drain Foley Cns- moves all limbs  Labs    Latest Ref Rng & Units 07/23/2022   10:44 AM 07/22/2022    6:31 AM 07/21/2022    8:01 PM  CBC  WBC 4.0 - 10.5 K/uL 6.1  6.6  6.7   Hemoglobin 13.0 - 17.0 g/dL 8.1  7.7  8.3   Hematocrit 39.0 - 52.0 % 25.9  23.5  26.3   Platelets 150 - 400 K/uL 263  250  302        Latest Ref Rng & Units  07/23/2022   10:44 AM 07/23/2022   10:43 AM 07/22/2022    6:31 AM  CMP  Glucose 70 - 99 mg/dL 88   85   BUN 8 - 23 mg/dL 15   16   Creatinine 14/10/2021 - 1.24 mg/dL 5.70   1.77   Sodium 9.39 - 145 mmol/L 142   144   Potassium 3.5 - 5.1 mmol/L 3.7   3.6   Chloride 98 - 111 mmol/L 117   120   CO2 22 - 32 mmol/L 23   22   Calcium 8.9 - 10.3 mg/dL 9.3   9.5   Total Protein 6.5 - 8.1 g/dL  5.6    Total Bilirubin 0.3 - 1.2 mg/dL  0.6    Alkaline Phos 38 - 126 U/L  68    AST 15 - 41 U/L  18  ALT 0 - 44 U/L  11      Micro 07/21/22- BC- anerobic bottle VRE Aerobic bottle gram positive rod  12/4 /2 BC X3 Ng  Impression/recommendation  Poor output and intake improved  ?VRE bacteremia- 1/4- hard to consider this as a contaminant especially with intraabdominal abscesses  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 and underwent colectomy in Oct/Nov at [facility]( records not available) and had intraabdominal abscess for which he had a drain The culture on the abdominal fluid was enterococcus and candida- was R to ampicillin- looks like he was treated before but not sure of duraion The JP drain was removed on 11/27 inspite of a sizeable abscess in the abdomen  repeat Ct abdomen shows the abscess which is similar size to 11/24- drain was placed today and culture sent t  Repeat blood culture- NG On linezolid- duration and kind of antibiotics depends on the duration we choose   Sacral decub stage -2 looks okay   Anemia Discussed the management with his wife

## 2022-07-25 NOTE — Assessment & Plan Note (Signed)
Continue supplements

## 2022-07-25 NOTE — Progress Notes (Addendum)
Progress Note   Patient: Carl Figueroa EGB:[id number] DOB: 09-28-1945 DOA: 07/21/2022     2 DOS: the patient was seen and examined on 07/25/2022   Brief hospital course: 76 year old male significant history of hypertension, anxiety, Lewy body dementia, osteoarthritis who presents to the ED with acute onset of hypotension associated with diminished urinary output. I had a conversation with the patient's wife at bedside today. She states over the last 2 to 3 days he has had decreasing level of mentation, increasing confusion, decreased oral intake and decreased urinary output. So she brought him to the emergency room. He does have evidence of acute kidney injury. His urinalysis is not indicative of infection. Admission status downgraded to observation. Will monitor overnight. Recheck 5 AM labs. If laboratory workup is stable patient will be discharged home in the care of his wife.     Case discussed with Dr. Rivka Safer infectious disease. Concerned that Enterococcus VCM noted 1/4 blood cultures could be translocation from previously noted intra-abdominal fluid collection. Patient had a recent admission in which CAT scan was done that showed a persistent but decreased in size 11.4 cm. (A fluid collection. Previously had abdominal drain in place. This abdominal drain was pulled by general surgery on previous admission. Has not had repeat imaging.    Repeat CT abdomen pelvis pursued.  Case discussed with radiology.  Fluid pocket around the same size as prior.  Interventional radiology consulted.  Plan for percutaneous drainage.  Abscess cavity too small to leave drain in place.  Will send fluid for culture and sensitivities.  Patient on IV Zyvox.  Assessment and Plan: * VRE bacteremia Continue IV Zyvox.  Await infectious disease opinion on what to do with antibiotics upon disposition.  Repeat blood cultures negative for 2 days.  Have interventional radiology procedure to drain abscess  today.  Intra-abdominal abscess [facility]) Interventional radiology procedure today drying off some fluid from the abdomen and sending for culture.  Left a drain in place.  AKI (acute kidney injury) (HCC) Creatinine 1.47 on presentation.  Last month's creatinine 1.04.  Today's creatinine 1.2.  Hypotension Resolved  Sacral decubitus ulcer, stage II (HCC) Present on admission, see full description below  Anemia of chronic disease Ferritin 211, last hemoglobin 8.1  Protein-calorie malnutrition, severe Continue supplements  BPH (benign prostatic hyperplasia) - We will continue Flomax.  Dementia, senile with depression (HCC) - We will continue Exelon and Aricept, Zoloft, Effexor XR and Seroquel as well as Klonopin.  Dyslipidemia continue statin therapy.        Subjective: Patient seen after his procedure today.  Felt okay.  Being treated for VRE bacteremia.  Physical Exam: Vitals:   07/25/22 0950 07/25/22 0955 07/25/22 1000 07/25/22 1015  BP:  (!) 139/95 133/70 130/81  Pulse:  (!) 53 62   Resp: 20 15 14 14   Temp:      TempSrc:      SpO2:  100% 92% 96%  Weight:      Height:       Physical Exam HENT:     Head: Normocephalic.     Mouth/Throat:     Pharynx: No oropharyngeal exudate.  Eyes:     General: Lids are normal.     Conjunctiva/sclera: Conjunctivae normal.  Cardiovascular:     Rate and Rhythm: Normal rate and regular rhythm.     Heart sounds: Normal heart sounds, S1 normal and S2 normal.  Pulmonary:     Breath sounds: No decreased breath sounds, wheezing, rhonchi or rales.  Abdominal:     Palpations: Abdomen is soft.     Tenderness: There is no abdominal tenderness.  Musculoskeletal:     Right lower leg: No swelling.     Left lower leg: No swelling.  Skin:    General: Skin is warm.     Findings: No rash.  Neurological:     Mental Status: He is alert.     Data Reviewed: Creatinine 1.2, hemoglobin 8.1  Family Communication: Updated wife on the  phone  Disposition: Status is: Inpatient Remains inpatient appropriate because: Had interventional radiology procedure today  Planned Discharge Destination: Home    Time spent: 28 minutes  Author: Loletha Grayer, MD 07/25/2022 4:26 PM  For on call review [email or web address].

## 2022-07-25 NOTE — Progress Notes (Signed)
Occupational Therapy Treatment Patient Details Name: Carl Figueroa MRN: [id number] DOB: [birth year] Today's Date: 07/25/2022   History of present illness Carl Figueroa is a 76 y.o. male with medical history significant for essential hypertension, anxiety and Lewy body dementia as well as osteoarthritis, who presented to the emergency room with acute onset of hypotension and diminished urinary output.  He admits to urinary frequency and dysuria without hematuria or flank pain.  The patient had a recent hospitalization for appendectomy that was complicated and he underwent colostomy. Pt has Foley cath.   OT comments  Pt agreeable to OT/PT co-treatment to maximize safety and participation. Upon entering session, pt supine in bed with PT and wife present. Pt requesting to don pants before getting OOB and required Max A at bed level. Able to complete bridging to assist with donning pants over B hips. He required Mod A for UB dressing with spouse assisting. Attempted training session for slide board transfer from EOB>recliner, however, pt unable to complete 2/2 cognition despite max multimodal cues and visual demonstration. Pt trying to stand with each attempt. Pt with successful step pivot transfer to recliner with Mod A +2 via HHA. Pt left sitting in recliner with wife present. Pt is making progress toward goal completion. D/C recommendation remains appropriate. OT will continue to follow acutely.      Recommendations for follow up therapy are one component of a multi-disciplinary discharge planning process, led by the attending physician.  Recommendations may be updated based on patient status, additional functional criteria and insurance authorization.    Follow Up Recommendations  Home health OT     Assistance Recommended at Discharge Frequent or constant Supervision/Assistance  Patient can return home with the following  Assistance with cooking/housework;Assist for transportation;Help  with stairs or ramp for entrance;Assistance with feeding;Direct supervision/assist for medications management;Direct supervision/assist for financial management;A lot of help with walking and/or transfers;A lot of help with bathing/dressing/bathroom   Equipment Recommendations  None recommended by OT    Recommendations for Other Services      Precautions / Restrictions Precautions Precautions: Fall Precaution Comments: ostomy bag, foley, JP drain Restrictions Weight Bearing Restrictions: No       Mobility Bed Mobility Overal bed mobility: Needs Assistance Bed Mobility: Supine to Sit     Supine to sit: Min assist     General bed mobility comments: VC for sequencing    Transfers Overall transfer level: Needs assistance Equipment used: 2 person hand held assist Transfers: Sit to/from Stand Sit to Stand: Min assist, +2 physical assistance     Step pivot transfers: +2 physical assistance, Mod assist     General transfer comment: attempted to have patient use slide board for transfers, he could not cognitively problem solve how to use it. Patient continued to try to stand to transfer despite max multimodal cues.     Balance Overall balance assessment: Needs assistance Sitting-balance support: Feet supported, Bilateral upper extremity supported Sitting balance-Leahy Scale: Fair     Standing balance support: Bilateral upper extremity supported, During functional activity Standing balance-Leahy Scale: Poor                             ADL either performed or assessed with clinical judgement   ADL Overall ADL's : Needs assistance/impaired                 Upper Body Dressing : Moderate assistance;Sitting;Cueing for sequencing Upper Body Dressing Details (indicate  cue type and reason): to don long sleeve shirt Lower Body Dressing: Maximal assistance;Bed level;Cueing for sequencing Lower Body Dressing Details (indicate cue type and reason): to don pants                     Extremity/Trunk Assessment Upper Extremity Assessment Upper Extremity Assessment: Generalized weakness   Lower Extremity Assessment Lower Extremity Assessment: Generalized weakness        Vision Patient Visual Report: No change from baseline     Perception     Praxis      Cognition Arousal/Alertness: Awake/alert Behavior During Therapy: Flat affect Overall Cognitive Status: History of cognitive impairments - at baseline Area of Impairment: Orientation, Attention, Problem solving, Memory, Following commands, Safety/judgement, Awareness                 Orientation Level: Disoriented to, Place, Time, Situation Current Attention Level: Focused Memory: Decreased recall of precautions, Decreased short-term memory Following Commands: Follows one step commands inconsistently Safety/Judgement: Decreased awareness of safety, Decreased awareness of deficits Awareness: Intellectual Problem Solving: Slow processing, Decreased initiation, Difficulty sequencing, Requires verbal cues, Requires tactile cues          Exercises      Shoulder Instructions       General Comments      Pertinent Vitals/ Pain       Pain Assessment Pain Assessment: No/denies pain  Home Living                                          Prior Functioning/Environment              Frequency  Min 2X/week        Progress Toward Goals  OT Goals(current goals can now be found in the care plan section)  Progress towards OT goals: Progressing toward goals  Acute Rehab OT Goals Patient Stated Goal: wife wants to take pt home OT Goal Formulation: With family Time For Goal Achievement: 08/07/22 Potential to Achieve Goals: Fair  Plan Discharge plan remains appropriate;Frequency remains appropriate    Co-evaluation    PT/OT/SLP Co-Evaluation/Treatment: Yes Reason for Co-Treatment: For patient/therapist safety;To address functional/ADL  transfers PT goals addressed during session: Mobility/safety with mobility;Balance;Proper use of DME OT goals addressed during session: ADL's and self-care      AM-PAC OT "6 Clicks" Daily Activity     Outcome Measure   Help from another person eating meals?: A Little Help from another person taking care of personal grooming?: A Lot Help from another person toileting, which includes using toliet, bedpan, or urinal?: Total Help from another person bathing (including washing, rinsing, drying)?: A Lot Help from another person to put on and taking off regular upper body clothing?: A Lot Help from another person to put on and taking off regular lower body clothing?: A Lot 6 Click Score: 12    End of Session    OT Visit Diagnosis: Unsteadiness on feet (R26.81);Muscle weakness (generalized) (M62.81);Other symptoms and signs involving cognitive function;Pain   Activity Tolerance Patient tolerated treatment well   Patient Left in chair;with call bell/phone within reach;with family/visitor present   Nurse Communication Mobility status        Time: 6295-2841 OT Time Calculation (min): 23 min  Charges: OT General Charges $OT Visit: 1 Visit OT Treatments $Self Care/Home Management : 8-22 mins  [facility] MS, OTR/L ascom [phone]  07/25/22, 2:48 PM

## 2022-07-25 NOTE — Assessment & Plan Note (Signed)
Ferritin 211, last hemoglobin 8.1

## 2022-07-25 NOTE — Procedures (Signed)
Interventional Radiology Procedure Note  Procedure: CT guided abdominal drain placement  Findings: Please refer to procedural dictation for full description. 10.2 Fr RLQ abscess drain placement.  ~3 ml sanguinopurulent aspirate, sample sent for culture.  Complications: None immediate  Estimated Blood Loss:  < 65mL  Recommendations: Keep to bulb suction. Follow up culture. IR will follow.   Marliss Coots, MD

## 2022-07-25 NOTE — Progress Notes (Signed)
Physical Therapy Treatment Patient Details Name: Carl Figueroa MRN: [id number] DOB: 1945/08/21 Today's Date: 07/25/2022   History of Present Illness Carl Figueroa is a 76 y.o. male with medical history significant for essential hypertension, anxiety and Lewy body dementia as well as osteoarthritis, who presented to the emergency room with acute onset of hypotension and diminished urinary output.  He admits to urinary frequency and dysuria without hematuria or flank pain.  The patient had a recent hospitalization for appendectomy that was complicated and he underwent colostomy. Pt has Foley cath.    PT Comments    Patient received in bed. Wife at bedside. Wife/Patient agreeable to PT session. Patient with baseline cognitive deficits. Attempted to use slide board to get patient from bed to recliner. Due to poor cognition, patient was unable to use effectively despite cues. He instinctively performed stand pivot to recliner with mod +2 assist. Patient appears to be making progress with transfers per wife. He was able to move his feet and step toward chair with cues. Patient will continue to benefit from skilled PT while here to improve strength and functional independence to reduce caregiver burden.        Recommendations for follow up therapy are one component of a multi-disciplinary discharge planning process, led by the attending physician.  Recommendations may be updated based on patient status, additional functional criteria and insurance authorization.  Follow Up Recommendations  Home health PT Can patient physically be transported by private vehicle: Yes   Assistance Recommended at Discharge Frequent or constant Supervision/Assistance  Patient can return home with the following A lot of help with walking and/or transfers;A lot of help with bathing/dressing/bathroom   Equipment Recommendations  None recommended by PT    Recommendations for Other Services       Precautions  / Restrictions Precautions Precautions: Fall Precaution Comments: ostomy bag, foley, JP drain Restrictions Weight Bearing Restrictions: No     Mobility  Bed Mobility Overal bed mobility: Needs Assistance Bed Mobility: Supine to Sit       Sit to supine: Min assist   General bed mobility comments: VC for sequencing    Transfers Overall transfer level: Needs assistance Equipment used: None Transfers: Sit to/from Stand Sit to Stand: Min assist, +2 physical assistance, +2 safety/equipment   Step pivot transfers: Min assist, +2 physical assistance, +2 safety/equipment       General transfer comment: attempted to have patient use slide board for transfers, he could not cognitively problem solve how to use it. Patient continued to try to stand to transfer despite max visual, tactile and verbal cues.    Ambulation/Gait               General Gait Details: unable at this time. Difficulty advancing feet for stepping to recliner during transfer   Stairs             Wheelchair Mobility    Modified Rankin (Stroke Patients Only)       Balance Overall balance assessment: Needs assistance Sitting-balance support: Feet supported, Bilateral upper extremity supported Sitting balance-Leahy Scale: Fair Sitting balance - Comments: able to static sit unsupported   Standing balance support: Bilateral upper extremity supported, During functional activity Standing balance-Leahy Scale: Poor Standing balance comment: Requiring mod +2 for step transfer to recliner                            Cognition Arousal/Alertness: Awake/alert Behavior During Therapy: Flat affect  Overall Cognitive Status: History of cognitive impairments - at baseline Area of Impairment: Orientation, Attention, Problem solving, Memory, Following commands, Safety/judgement, Awareness                 Orientation Level: Disoriented to, Place, Time, Situation Current Attention Level:  Focused Memory: Decreased recall of precautions, Decreased short-term memory Following Commands: Follows one step commands inconsistently Safety/Judgement: Decreased awareness of safety, Decreased awareness of deficits Awareness: Intellectual Problem Solving: Slow processing, Decreased initiation, Difficulty sequencing, Requires verbal cues, Requires tactile cues General Comments: History of lewy body dementia. Pt oriented to self only; reported current year in 1998, hospital is [facility], and month is October        Exercises      General Comments        Pertinent Vitals/Pain Pain Assessment Pain Assessment: No/denies pain    Home Living                          Prior Function            PT Goals (current goals can now be found in the care plan section) Acute Rehab PT Goals Patient Stated Goal: to get better and bring pt home with help. PT Goal Formulation: With family Time For Goal Achievement: 08/07/22 Potential to Achieve Goals: Good Progress towards PT goals: Progressing toward goals    Frequency    Min 2X/week      PT Plan Current plan remains appropriate    Co-evaluation PT/OT/SLP Co-Evaluation/Treatment: Yes Reason for Co-Treatment: For patient/therapist safety;To address functional/ADL transfers PT goals addressed during session: Mobility/safety with mobility;Balance;Proper use of DME        AM-PAC PT "6 Clicks" Mobility   Outcome Measure  Help needed turning from your back to your side while in a flat bed without using bedrails?: A Little Help needed moving from lying on your back to sitting on the side of a flat bed without using bedrails?: A Little Help needed moving to and from a bed to a chair (including a wheelchair)?: A Lot Help needed standing up from a chair using your arms (e.g., wheelchair or bedside chair)?: A Lot Help needed to walk in hospital room?: Total Help needed climbing 3-5 steps with a railing? : Total 6 Click Score:  12    End of Session   Activity Tolerance: Patient tolerated treatment well Patient left: in chair;with family/visitor present Nurse Communication: Mobility status PT Visit Diagnosis: Muscle weakness (generalized) (M62.81);Difficulty in walking, not elsewhere classified (R26.2);Other abnormalities of gait and mobility (R26.89);Unsteadiness on feet (R26.81)     Time: 1335-1350 PT Time Calculation (min) (ACUTE ONLY): 15 min  Charges:  $Therapeutic Activity: 8-22 mins                     Carl Figueroa, PT, GCS 07/25/22,2:16 PM

## 2022-07-26 ENCOUNTER — Other Ambulatory Visit (HOSPITAL_COMMUNITY): Payer: Self-pay

## 2022-07-26 DIAGNOSIS — R7881 Bacteremia: Secondary | ICD-10-CM | POA: Diagnosis not present

## 2022-07-26 DIAGNOSIS — I9589 Other hypotension: Secondary | ICD-10-CM | POA: Diagnosis not present

## 2022-07-26 DIAGNOSIS — K651 Peritoneal abscess: Secondary | ICD-10-CM | POA: Diagnosis not present

## 2022-07-26 DIAGNOSIS — R338 Other retention of urine: Secondary | ICD-10-CM

## 2022-07-26 DIAGNOSIS — D638 Anemia in other chronic diseases classified elsewhere: Secondary | ICD-10-CM

## 2022-07-26 DIAGNOSIS — N179 Acute kidney failure, unspecified: Secondary | ICD-10-CM | POA: Diagnosis not present

## 2022-07-26 DIAGNOSIS — N401 Enlarged prostate with lower urinary tract symptoms: Secondary | ICD-10-CM

## 2022-07-26 LAB — CULTURE, BLOOD (ROUTINE X 2)
Culture: NO GROWTH
Special Requests: ADEQUATE
Special Requests: ADEQUATE

## 2022-07-26 LAB — BASIC METABOLIC PANEL
Anion gap: 2 — ABNORMAL LOW (ref 5–15)
BUN: 12 mg/dL (ref 8–23)
CO2: 26 mmol/L (ref 22–32)
Calcium: 9.2 mg/dL (ref 8.9–10.3)
Chloride: 114 mmol/L — ABNORMAL HIGH (ref 98–111)
Creatinine, Ser: 1.19 mg/dL (ref 0.61–1.24)
GFR, Estimated: >60 mL/min (ref >60)
Glucose, Bld: 97 mg/dL (ref 70–99)
Potassium: 3.9 mmol/L (ref 3.5–5.1)
Sodium: 142 mmol/L (ref 135–145)

## 2022-07-26 LAB — HEMOGLOBIN: Hemoglobin: 7.9 g/dL — ABNORMAL LOW (ref 13.0–17.0)

## 2022-07-26 MED ORDER — ZINC SULFATE 220 (50 ZN) MG PO CAPS: 220.0000 mg | ORAL_CAPSULE | Freq: Every day | ORAL | 0 refills | Status: DC

## 2022-07-26 MED ORDER — ADULT MULTIVITAMIN W/MINERALS CH: 1.0000 | ORAL_TABLET | Freq: Every day | ORAL | Status: DC

## 2022-07-26 MED ORDER — LINEZOLID 600 MG PO TABS: 600.0000 mg | ORAL_TABLET | Freq: Two times a day (BID) | ORAL | 0 refills | Status: AC

## 2022-07-26 MED ORDER — ENSURE ENLIVE PO LIQD: 237.0000 mL | Freq: Three times a day (TID) | ORAL | 0 refills | Status: DC

## 2022-07-26 MED ORDER — ASCORBIC ACID 500 MG PO TABS: 500.0000 mg | ORAL_TABLET | Freq: Two times a day (BID) | ORAL | 0 refills | Status: DC

## 2022-07-26 MED ORDER — LINEZOLID 600 MG PO TABS: 600.0000 mg | ORAL_TABLET | Freq: Two times a day (BID) | ORAL | 0 refills | Status: DC

## 2022-07-26 NOTE — Plan of Care (Signed)
  Problem: Fluid Volume: Goal: Hemodynamic stability will improve Outcome: Progressing   Problem: Clinical Measurements: Goal: Diagnostic test results will improve Outcome: Progressing Goal: Signs and symptoms of infection will decrease Outcome: Progressing   Problem: Respiratory: Goal: Ability to maintain adequate ventilation will improve Outcome: Progressing   Problem: Education: Goal: Knowledge of disease or condition will improve Outcome: Progressing Goal: Knowledge of secondary prevention will improve (MUST DOCUMENT ALL) Outcome: Progressing Goal: Knowledge of patient specific risk factors will improve (Mark N/A or DELETE if not current risk factor) Outcome: Progressing   Problem: Ischemic Stroke/TIA Tissue Perfusion: Goal: Complications of ischemic stroke/TIA will be minimized Outcome: Progressing   Problem: Coping: Goal: Will verbalize positive feelings about self Outcome: Progressing Goal: Will identify appropriate support needs Outcome: Progressing   Problem: Health Behavior/Discharge Planning: Goal: Ability to manage health-related needs will improve Outcome: Progressing Goal: Goals will be collaboratively established with patient/family Outcome: Progressing   Problem: Self-Care: Goal: Ability to participate in self-care as condition permits will improve Outcome: Progressing Goal: Verbalization of feelings and concerns over difficulty with self-care will improve Outcome: Progressing Goal: Ability to communicate needs accurately will improve Outcome: Progressing   Problem: Nutrition: Goal: Risk of aspiration will decrease Outcome: Progressing Goal: Dietary intake will improve Outcome: Progressing   Problem: Education: Goal: Knowledge of General Education information will improve Description: Including pain rating scale, medication(s)/side effects and non-pharmacologic comfort measures Outcome: Progressing   Problem: Health Behavior/Discharge  Planning: Goal: Ability to manage health-related needs will improve Outcome: Progressing   Problem: Clinical Measurements: Goal: Ability to maintain clinical measurements within normal limits will improve Outcome: Progressing Goal: Will remain free from infection Outcome: Progressing Goal: Diagnostic test results will improve Outcome: Progressing Goal: Respiratory complications will improve Outcome: Progressing Goal: Cardiovascular complication will be avoided Outcome: Progressing   Problem: Activity: Goal: Risk for activity intolerance will decrease Outcome: Progressing   Problem: Nutrition: Goal: Adequate nutrition will be maintained Outcome: Progressing   Problem: Coping: Goal: Level of anxiety will decrease Outcome: Progressing   Problem: Elimination: Goal: Will not experience complications related to bowel motility Outcome: Progressing Goal: Will not experience complications related to urinary retention Outcome: Progressing   Problem: Pain Managment: Goal: General experience of comfort will improve Outcome: Progressing   Problem: Safety: Goal: Ability to remain free from injury will improve Outcome: Progressing   Problem: Skin Integrity: Goal: Risk for impaired skin integrity will decrease Outcome: Progressing   

## 2022-07-26 NOTE — Progress Notes (Signed)
Physical Therapy Treatment Patient Details Name: Carl Figueroa MRN: [id number] DOB: [birth year] Today's Date: 07/26/2022   History of Present Illness Carl Figueroa is a 76 y.o. male with medical history significant for essential hypertension, anxiety and Lewy body dementia as well as osteoarthritis, who presented to the emergency room with acute onset of hypotension and diminished urinary output.  He admits to urinary frequency and dysuria without hematuria or flank pain.  The patient had a recent hospitalization for appendectomy that was complicated and he underwent colostomy. Pt has Foley cath.    PT Comments    Patient received in bed, wife not present today. Patient seems more confused and having increased difficulty following direction today. Attempted to get patient up to edge of bed, but due to cognition was only able to assist re-positioning in bed. Patient will continue to benefit from skilled PT as able to improve mobility and decrease caregiver burden.     Recommendations for follow up therapy are one component of a multi-disciplinary discharge planning process, led by the attending physician.  Recommendations may be updated based on patient status, additional functional criteria and insurance authorization.  Follow Up Recommendations  Home health PT (wife's preference) Can patient physically be transported by private vehicle: No   Assistance Recommended at Discharge Frequent or constant Supervision/Assistance  Patient can return home with the following Two people to help with walking and/or transfers;A lot of help with bathing/dressing/bathroom;Direct supervision/assist for medications management   Equipment Recommendations  None recommended by PT    Recommendations for Other Services       Precautions / Restrictions Precautions Precautions: Fall Precaution Comments: ostomy bag, foley, JP drain Restrictions Weight Bearing Restrictions: No     Mobility  Bed  Mobility Overal bed mobility: Needs Assistance             General bed mobility comments: Patient having increased difficulty following direction/staying on task. Not speaking coherently. Assisted patient with positioning in bed, he was unable to follow direction to assist.    Transfers                   General transfer comment: did not attempt due to confusion this session    Ambulation/Gait               General Gait Details: unable   Stairs             Wheelchair Mobility    Modified Rankin (Stroke Patients Only)       Balance                                            Cognition Arousal/Alertness: Awake/alert Behavior During Therapy: Flat affect, Restless Overall Cognitive Status: Impaired/Different from baseline Area of Impairment: Orientation, Attention, Problem solving, Memory, Following commands, Safety/judgement, Awareness                 Orientation Level: Disoriented to, Place, Time, Situation Current Attention Level: Focused Memory: Decreased recall of precautions, Decreased short-term memory Following Commands: Follows one step commands inconsistently Safety/Judgement: Decreased awareness of safety, Decreased awareness of deficits Awareness: Intellectual Problem Solving: Slow processing, Decreased initiation, Difficulty sequencing, Requires verbal cues, Requires tactile cues General Comments: History of lewy body dementia. Pt oriented to self only; reported current year in 1998, hospital is [facility], and month is October  Exercises      General Comments        Pertinent Vitals/Pain Pain Assessment Pain Assessment: Faces Breathing: normal Negative Vocalization: none Facial Expression: smiling or inexpressive Body Language: relaxed Consolability: no need to console PAINAD Score: 0    Home Living                          Prior Function            PT Goals (current goals can  now be found in the care plan section) Acute Rehab PT Goals Patient Stated Goal: to get better and bring pt home with help. PT Goal Formulation: With family Time For Goal Achievement: 08/07/22 Potential to Achieve Goals: Fair Progress towards PT goals: Not progressing toward goals - comment    Frequency    Min 2X/week      PT Plan Current plan remains appropriate    Co-evaluation              AM-PAC PT "6 Clicks" Mobility   Outcome Measure  Help needed turning from your back to your side while in a flat bed without using bedrails?: A Lot Help needed moving from lying on your back to sitting on the side of a flat bed without using bedrails?: A Lot Help needed moving to and from a bed to a chair (including a wheelchair)?: A Lot Help needed standing up from a chair using your arms (e.g., wheelchair or bedside chair)?: A Lot Help needed to walk in hospital room?: Total Help needed climbing 3-5 steps with a railing? : Total 6 Click Score: 10    End of Session   Activity Tolerance: Other (comment) (limited by confusion) Patient left: in bed;with bed alarm set;with call bell/phone within reach Nurse Communication: Mobility status PT Visit Diagnosis: Other abnormalities of gait and mobility (R26.89);Unsteadiness on feet (R26.81)     Time: 1440-1450 PT Time Calculation (min) (ACUTE ONLY): 10 min  Charges:  $Therapeutic Activity: 8-22 mins                     [employer], PT, GCS 07/26/22,3:04 PM

## 2022-07-26 NOTE — Care Management Important Message (Signed)
Important Message  Patient Details  Name: Carl Figueroa MRN: [id number] Date of Birth: [birth year]   Medicare Important Message Given:  Yes  Attempted to review Medicare IM with patient via room phone [phone]) due to isolation status, however line busy.  Reviewed Medicare IM with patient's wife, Abid Bolla, at [phone].  Copy of Medicare IM to be delivered to patient's room via nursing staff.    Johnell Comings 07/26/2022, 2:20 PM

## 2022-07-26 NOTE — Progress Notes (Addendum)
Progress Note   Patient: Carl Figueroa AST:[id number] DOB: 1946-02-18 DOA: 07/21/2022     4 DOS: the patient was seen and examined on 07/27/2022   Brief hospital course: 76 year old male significant history of hypertension, anxiety, Lewy body dementia, osteoarthritis who presents to the ED with acute onset of hypotension associated with diminished urinary output. I had a conversation with the patient's wife at bedside today. She states over the last 2 to 3 days he has had decreasing level of mentation, increasing confusion, decreased oral intake and decreased urinary output. So she brought him to the emergency room. He does have evidence of acute kidney injury. His urinalysis is not indicative of infection. Admission status downgraded to observation. Will monitor overnight. Recheck 5 AM labs. If laboratory workup is stable patient will be discharged home in the care of his wife.     Case discussed with Dr. Rivka Safer infectious disease. Concerned that Enterococcus VCM noted 1/4 blood cultures could be translocation from previously noted intra-abdominal fluid collection. Patient had a recent admission in which CAT scan was done that showed a persistent but decreased in size 11.4 cm. (A fluid collection. Previously had abdominal drain in place. This abdominal drain was pulled by general surgery on previous admission. Has not had repeat imaging.    Repeat CT abdomen pelvis pursued.  Case discussed with radiology.  Fluid pocket around the same size as prior.  Interventional radiology consulted.  Plan for percutaneous drainage.  Abscess cavity too small to leave drain in place.  Will send fluid for culture and sensitivities. Patient on IV Zyvox.  07/26/2022.  Infectious disease recommended oral Zyvox for 1 more week 600 mg twice daily.  Unable to obtain this prescription at a local pharmacy and needs to go through the [place]. will need this prescription filled prior to being discharged.  Prescription  obtained and stable for discharge.    Assessment and Plan: * VRE bacteremia Continue po Zyvox.  Case discussed with infectious disease specialist.  1 more week of p.o. Zyvox 600 mg twice a day.  Repeat blood cultures negative for 2 days  Intra-abdominal abscess [facility]) Interventional radiology procedure 12/6 drawing off some fluid from the abdomen and sending for culture.  Left a drain in place.  So far no organisms seen on culture.  AKI (acute kidney injury) (HCC) Creatinine 1.47 on presentation.  Last month's creatinine 1.04.  Today's creatinine 1.19.  Hypotension Resolved  Sacral decubitus ulcer, stage II (HCC) Present on admission, see full description below  Anemia of chronic disease Ferritin 211, last hemoglobin 7.9  Protein-calorie malnutrition, severe Continue supplements  BPH (benign prostatic hyperplasia) Foley catheter changed in the emergency room on 07/21/2022.  Continue chronic Foley catheter management.  Continue Flomax and Proscar.  Dementia, senile with depression (HCC) - We will continue Exelon and Aricept, Zoloft, Effexor XR and Seroquel as well as Klonopin.  Dyslipidemia continue statin therapy.        Subjective: Patient seen this morning.  Felt okay.  Interested in getting out of the hospital.  Had problems trying to obtain the Zyvox prescription today.  Physical Exam: Vitals:   07/26/22 2044 07/26/22 2044 07/27/22 0536 07/27/22 0817  BP: (!) 135/58  116/68 130/78  Pulse: 70 70 62 (!) 104  Resp: 18  18 16   Temp: 98.4 F (36.9 C)  98 F (36.7 C) 97.7 F (36.5 C)  TempSrc: Oral  Oral   SpO2: 92% (!) 86% 92% (!) 78%  Weight:  Height:       Physical Exam HENT:     Head: Normocephalic.     Mouth/Throat:     Pharynx: No oropharyngeal exudate.  Eyes:     General: Lids are normal.     Conjunctiva/sclera: Conjunctivae normal.  Cardiovascular:     Rate and Rhythm: Normal rate and regular rhythm.     Heart sounds: Normal heart sounds, S1  normal and S2 normal.  Pulmonary:     Breath sounds: No decreased breath sounds, wheezing, rhonchi or rales.  Abdominal:     Palpations: Abdomen is soft.     Tenderness: There is no abdominal tenderness.  Musculoskeletal:     Right lower leg: No swelling.     Left lower leg: No swelling.  Skin:    General: Skin is warm.     Findings: No rash.  Neurological:     Mental Status: He is alert.     Data Reviewed: Creatinine 1.19, hemoglobin 7.9  Family Communication: Spoke with patient's wife on the phone  Disposition: Status is: Inpatient Remains inpatient appropriate because: We had trouble obtaining the Zyvox prescription.  Since it is now late in the day we will discharge tomorrow morning since he needs transportation home  Planned Discharge Destination: Home with Home Health    Time spent: 28 minutes  Author: Alford Highland, MD 07/27/2022 11:00 AM  For on call review [email or web address].

## 2022-07-26 NOTE — Progress Notes (Incomplete)
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 Sitting in chair- wants to go home Had IR place a drain in the intraabdominal collection and culture of fluid sent   O/e awake Pleasantly confused with memory isses Patient Vitals for the past 24 hrs:  BP Temp Temp src Pulse Resp SpO2  07/26/22 0915 131/63 98 F (36.7 C) -- (!) 55 18 93 %  07/26/22 0350 109/61 98.1 F (36.7 C) Axillary 63 15 100 %  07/25/22 2109 (!) 141/67 98.8 F (37.1 C) Oral (!) 56 18 100 %  07/25/22 1634 (!) 130/56 98.8 F (37.1 C) -- 88 18 --    Chest b/l air entry [id number] Abd soft- ileostomy bag Rt upper quadrant/ near ileostomy  JP drain Foley Cns- moves all limbs  Labs    Latest Ref Rng & Units 07/26/2022    5:26 AM 07/23/2022   10:44 AM 07/22/2022    6:31 AM  CBC  WBC 4.0 - 10.5 K/uL  6.1  6.6   Hemoglobin 13.0 - 17.0 g/dL 7.9  8.1  7.7   Hematocrit 39.0 - 52.0 %  25.9  23.5   Platelets 150 - 400 K/uL  263  250        Latest Ref Rng & Units 07/26/2022    5:26 AM 07/23/2022   10:44 AM 07/23/2022   10:43 AM  CMP  Glucose 70 - 99 mg/dL 97  88    BUN 8 - 23 mg/dL 12  15    Creatinine 6.23 - 1.24 mg/dL 7.62  8.31    Sodium 517 - 145 mmol/L 142  142    Potassium 3.5 - 5.1 mmol/L 3.9  3.7    Chloride 98 - 111 mmol/L 114  117    CO2 22 - 32 mmol/L 26  23    Calcium 8.9 - 10.3 mg/dL 9.2  9.3    Total Protein 6.5 - 8.1 g/dL   5.6   Total Bilirubin 0.3 - 1.2 mg/dL   0.6   Alkaline Phos 38 - 126 U/L   68   AST 15 - 41 U/L   18   ALT 0 - 44 U/L   11     Micro 07/21/22- BC- anerobic bottle VRE Aerobic bottle gram positive rod  12/4 /2 BC X3 Ng  Impression/recommendation  Poor output and intake improved  ?VRE bacteremia-  1/4- hard to consider this as a contaminant especially with intraabdominal abscesses  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 and underwent colectomy in Oct/Nov at [facility]( records not available) and had intraabdominal abscess for which he had a drain The culture on the abdominal fluid was enterococcus and candida- was R to ampicillin- looks like he was treated before but not sure of duraion The JP drain was removed on 11/27 inspite of a sizeable abscess in the abdomen  repeat Ct abdomen shows the abscess which is similar size to 11/24- drain was placed today and culture sent t  Repeat blood culture- NG On linezolid- duration and kind of antibiotics depends on the duration we choose   Sacral decub stage -2 looks okay   Anemia Discussed the management with his wife

## 2022-07-26 NOTE — Discharge Summary (Signed)
Physician Discharge Summary   Patient: Carl Figueroa MRN: [id number] DOB: [birth year]  Admit date:     07/21/2022  Discharge date: 07/27/22  Discharge Physician: Alford Highland   PCP: Mick Sell, MD   Recommendations at discharge:   Follow-up PCP 5 days Follow up urology for voiding trail Number given for drain management at [facility]  Discharge Diagnoses: Principal Problem:   VRE bacteremia Active Problems:   Intra-abdominal abscess (HCC)   Hypotension   AKI (acute kidney injury) (HCC)   Sacral decubitus ulcer, stage II (HCC)   Dyslipidemia   Dementia, senile with depression (HCC)   BPH (benign prostatic hyperplasia)   Protein-calorie malnutrition, severe   Anemia of chronic disease    Hospital Course: 76 year old male significant history of hypertension, anxiety, Lewy body dementia, osteoarthritis who presents to the ED with acute onset of hypotension associated with diminished urinary output. I had a conversation with the patient's wife at bedside today. She states over the last 2 to 3 days he has had decreasing level of mentation, increasing confusion, decreased oral intake and decreased urinary output. So she brought him to the emergency room. He does have evidence of acute kidney injury. His urinalysis is not indicative of infection. Admission status downgraded to observation. Will monitor overnight. Recheck 5 AM labs. If laboratory workup is stable patient will be discharged home in the care of his wife.     Case discussed with Dr. Rivka Safer infectious disease. Concerned that Enterococcus VCM noted 1/4 blood cultures could be translocation from previously noted intra-abdominal fluid collection. Patient had a recent admission in which CAT scan was done that showed a persistent but decreased in size 11.4 cm. (A fluid collection. Previously had abdominal drain in place. This abdominal drain was pulled by general surgery on previous admission. Has not had  repeat imaging.    Repeat CT abdomen pelvis pursued.  Case discussed with radiology.  Fluid pocket around the same size as prior.  Interventional radiology consulted.  Plan for percutaneous drainage.  Abscess cavity too small to leave drain in place.  Will send fluid for culture and sensitivities. Patient on IV Zyvox.  07/26/2022.  Infectious disease recommended oral Zyvox for 1 more week 600 mg twice daily.  Unable to obtain this prescription at a local pharmacy and needs to go through the [place].  Assessment and Plan: * VRE bacteremia Continue po Zyvox.  Case discussed with infectious disease specialist.  1 more week of p.o. Zyvox 600 mg twice a day.  Repeat blood cultures negative for 2 days  Intra-abdominal abscess [facility]) Interventional radiology procedure 12/6 drawing off some fluid from the abdomen and sending for culture.  Left a drain in place.  So far no organisms seen on culture.  AKI (acute kidney injury) (HCC) Creatinine 1.47 on presentation.  Last month's creatinine 1.04.  Today's creatinine 1.19.  Hypotension Resolved  Sacral decubitus ulcer, stage II (HCC) Present on admission, see full description below  Anemia of chronic disease Ferritin 211, last hemoglobin 7.9  Protein-calorie malnutrition, severe Continue supplements  BPH (benign prostatic hyperplasia) Foley catheter changed in the emergency room on 07/21/2022.  Continue chronic Foley catheter management.  Continue Flomax and Proscar.  Dementia, senile with depression (HCC) - We will continue Exelon and Aricept, Zoloft, Effexor XR and Seroquel as well as Klonopin.  Dyslipidemia continue statin therapy.         Consultants: Infectious disease, interventional radiology Procedures performed: IR drainage of abscess in the abdomen Disposition: Home  health Diet recommendation:  Discharge Diet Orders (From admission, onward)     Start     Ordered   07/23/22 0000  Diet - low sodium heart healthy        07/23/22  1159           Cardiac diet DISCHARGE MEDICATION: Allergies as of 07/26/2022       Reactions   Morphine Nausea Only        Medication List     STOP taking these medications    QUEtiapine 25 MG tablet Commonly known as: SEROQUEL       TAKE these medications    ascorbic acid 500 MG tablet Commonly known as: VITAMIN C Take 1 tablet (500 mg total) by mouth 2 (two) times daily.   feeding supplement Liqd Take 237 mLs by mouth 3 (three) times daily between meals.   finasteride 5 MG tablet Commonly known as: PROSCAR Take 5 mg by mouth daily.   furosemide 40 MG tablet Commonly known as: LASIX Take 1 tablet (40 mg total) by mouth daily as needed. As needed for leg edema   linezolid 600 MG tablet Commonly known as: ZYVOX Take 1 tablet (600 mg total) by mouth every 12 (twelve) hours for 7 days.   multivitamin with minerals Tabs tablet Take 1 tablet by mouth daily. Start taking on: July 27, 2022   sertraline 25 MG tablet Commonly known as: ZOLOFT Take 25 mg by mouth daily.   tamsulosin 0.4 MG Caps capsule Commonly known as: FLOMAX Take 0.4 mg by mouth daily.   zinc sulfate 220 (50 Zn) MG capsule Take 1 capsule (220 mg total) by mouth daily. Start taking on: July 27, 2022               Durable Medical Equipment  (From admission, onward)           Start     Ordered   07/24/22 1335  For home use only DME Other see comment  Once       Comments: Transfer board  Question:  Length of Need  Answer:  Lifetime   07/24/22 1334            Discharge Exam: Filed Weights   07/23/22 1707  Weight: 58.3 kg   Physical Exam HENT:     Head: Normocephalic.     Mouth/Throat:     Pharynx: No oropharyngeal exudate.  Eyes:     General: Lids are normal.     Conjunctiva/sclera: Conjunctivae normal.  Cardiovascular:     Rate and Rhythm: Normal rate and regular rhythm.     Heart sounds: Normal heart sounds, S1 normal and S2 normal.  Pulmonary:      Breath sounds: No decreased breath sounds, wheezing, rhonchi or rales.  Abdominal:     Palpations: Abdomen is soft.     Tenderness: There is no abdominal tenderness.  Musculoskeletal:     Right lower leg: No swelling.     Left lower leg: No swelling.  Skin:    General: Skin is warm.     Findings: No rash.  Neurological:     Mental Status: He is alert.      Condition at discharge: stable  The results of significant diagnostics from this hospitalization (including imaging, microbiology, ancillary and laboratory) are listed below for reference.   Imaging Studies: CT GUIDED PERITONEAL/RETROPERITONEAL FLUID DRAIN BY PERC CATH  Result Date: 07/25/2022 INDICATION: 76 year old male with history of perforated bowel with postoperative abdominal fluid  collection concerning for abscess. EXAM: CT PERC DRAIN PERITONEAL ABCESS COMPARISON:  07/23/2022 MEDICATIONS: The patient is currently admitted to the hospital and receiving intravenous antibiotics. The antibiotics were administered within an appropriate time frame prior to the initiation of the procedure. ANESTHESIA/SEDATION: Moderate (conscious) sedation was employed during this procedure. A total of Versed 0 mg and Fentanyl 12.5 mcg was administered intravenously. Moderate Sedation Time: 17 minutes. The patient's level of consciousness and vital signs were monitored continuously by radiology nursing throughout the procedure under my direct supervision. CONTRAST:  None COMPLICATIONS: None immediate. PROCEDURE: RADIATION DOSE REDUCTION: This exam was performed according to the departmental dose-optimization program which includes automated exposure control, adjustment of the mA and/or kV according to patient size and/or use of iterative reconstruction technique. Informed written consent was obtained from the patient after a discussion of the risks, benefits and alternatives to treatment. The patient was placed on the CT gantry and a pre procedural CT was  performed re-demonstrating the known abscess/fluid collection within the right lower quadrant. The procedure was planned. A timeout was performed prior to the initiation of the procedure. The right lower quadrant was prepped and draped in the usual sterile fashion. The overlying soft tissues were anesthetized with 1% lidocaine with epinephrine. Appropriate trajectory was planned with the use of a 22 gauge spinal needle. An 18 gauge trocar needle was advanced into the abscess/fluid collection and a short Amplatz super stiff wire was coiled within the collection. Appropriate positioning was confirmed with a limited CT scan. The tract was serially dilated allowing placement of a 10 [place] all-purpose drainage catheter. Appropriate positioning was confirmed with a limited postprocedural CT scan. Approximately 3 ml of sanguino-purulent fluid was aspirated. The tube was connected to a bulb suction and sutured in place. A dressing was placed. The patient tolerated the procedure well without immediate post procedural complication. IMPRESSION: Successful CT guided placement of a 10 French all purpose drain catheter into the right upper quadrant with aspiration of approximately 3 mL of sanguino-purulent fluid. Samples were sent to the laboratory as requested by the ordering clinical team. Marliss Coots, MD [facility] Electronically Signed   By: Marliss Coots M.D.   On: 07/25/2022 10:43   CT ABDOMEN PELVIS W CONTRAST  Addendum Date: 07/24/2022   ADDENDUM REPORT: 07/24/2022 13:09 ADDENDUM: At the request of the referring provider, the craniocaudal dimension of the patient's residual abscess is provided, 7.0 cm, compared to 7.8 cm on 07/13/2022. Electronically Signed   By: Leanna Battles M.D.   On: 07/24/2022 13:09   Result Date: 07/24/2022 CLINICAL DATA:  Acute onset hypotension with decreased urinary output. Abscess. EXAM: CT ABDOMEN AND PELVIS WITH CONTRAST  TECHNIQUE: Multidetector CT imaging of the abdomen and pelvis was performed using the standard protocol following bolus administration of intravenous contrast. RADIATION DOSE REDUCTION: This exam was performed according to the departmental dose-optimization program which includes automated exposure control, adjustment of the mA and/or kV according to patient size and/or use of iterative reconstruction technique. CONTRAST:  OMNIPAQUE IOHEXOL 300 MG/ML  SOLN COMPARISON:  07/13/2022. FINDINGS: Lower chest: New small right pleural effusion. Moderate left pleural effusion, increased. Compressive atelectasis in both lower lobes. Atherosclerotic calcification of the aortic valve and coronary arteries. Heart is at the upper limits of normal in size to mildly enlarged. No pericardial effusion. Distal esophagus is grossly unremarkable. Hepatobiliary: Probable subcentimeter cysts in the liver. Liver is otherwise unremarkable. Stone in the gallbladder. Small pericholecystic fluid. No biliary  ductal dilatation. Pancreas: 1.4 cm low-attenuation lesion in the pancreatic body, stable. No gland atrophy or ductal dilatation. Spleen: Negative. Adrenals/Urinary Tract: Renal parenchymal thinning bilaterally. Subcentimeter low-attenuation lesion in the left kidney. No specific follow-up necessary. Kidneys are otherwise unremarkable. Ureters are decompressed. Foley catheter is seen in a decompressed bladder. Bladder wall may be thickened. Stomach/Bowel: Stomach and majority of the small bowel are unremarkable. 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 Persistent rectal wall thickening with presacral edema and fluid, as on 07/13/2022. Vascular/Lymphatic: Retroaortic left renal vein. Atherosclerotic calcification of the aorta. Fusiform dilatation of the celiac trunk, 10 mm. No pathologically enlarged lymph nodes. Reproductive: Prostate is visualized. Other: No additional free fluid. Well-circumscribed fluid collection  in the ventral right lower quadrant measures 2.2 x 3.7 cm (2/45), previously 2.4 x 4.5 cm on 07/13/2022, at which time a percutaneous drain was in place. No additional evidence of an abscess. Diffuse body wall edema. Musculoskeletal: Left hip arthroplasty. Right hip osteoarthritis. Degenerative changes in the spine. Grade 1 anterolisthesis of L5 on S1. IMPRESSION: 1. Residual abscess in the right lower quadrant, slightly smaller than on 07/13/2022. Interval percutaneous drain removal. 2. New small right pleural effusion. Moderate left pleural effusion, increased. Compressive atelectasis in both lower lobes. 3. Persistent rectal wall thickening with extensive presacral edema/fluid, unchanged. 4. Bladder wall thickening, as before. 5. Diffuse body wall edema. 6. Cholelithiasis. 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 8. 1.4 cm cystic lesion in body of the pancreas, as before. Given patient condition and age, no specific follow-up is recommended other than on routine imaging. 9. Aortic atherosclerosis (ICD10-I70.0). Coronary artery calcification. Electronically Signed: By: Leanna Battles M.D. On: 07/23/2022 15:34   DG Chest Port 1 View  Result Date: 07/21/2022 CLINICAL DATA:  Sepsis EXAM: PORTABLE CHEST 1 VIEW COMPARISON:  07/07/2022 FINDINGS: The heart size and mediastinal contours are within normal limits. Both lungs are clear. The visualized skeletal structures are unremarkable. IMPRESSION: No active disease. Electronically Signed   By: Deatra Robinson M.D.   On: 07/21/2022 23:07   CT ABDOMEN PELVIS W CONTRAST  Result Date: 07/13/2022 CLINICAL DATA:  Acute nonlocalized abdominal pain; drain in place; follow-up imaging for drain management EXAM: CT ABDOMEN AND PELVIS WITH CONTRAST TECHNIQUE: Multidetector CT imaging of the abdomen and pelvis was performed using the standard protocol following bolus administration of intravenous contrast. RADIATION DOSE REDUCTION: This exam was performed according to the  departmental dose-optimization program which includes automated exposure control, adjustment of the mA and/or kV according to patient size and/or use of iterative reconstruction technique. CONTRAST:  75mL OMNIPAQUE IOHEXOL 350 MG/ML SOLN COMPARISON:  CT 07/07/2022 FINDINGS: Lower chest: Mild cardiomegaly. Trace pericardial effusion. Small left pleural effusion unchanged. Left basilar atelectasis/scarring. Hepatobiliary: Hepatic cysts. No follow-up imaging recommended. No evidence of cholecystitis. No biliary dilation. Pancreas: 2 cm homogeneous rounded cystic lesion in the body/tail of the pancreas which is unchanged and could represent a pseudocyst, serous or mucinous cystadenoma or side branch IPMN. MRI without and with contrast is recommended when clinically feasible. No pancreatic ductal dilation. Spleen: Normal in size without focal abnormality. Adrenals/Urinary Tract: Unremarkable adrenal glands. Low-attenuation lesions in the kidneys are statistically likely to represent cysts. No follow-up is required. Punctate nonobstructing left nephrolithiasis. No hydronephrosis. The bladder is mildly distended and filled with gas and fluid some of which is layering posteriorly and may be hyperdense though evaluation is limited by streak artifact. Foley catheter in the bladder. 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 Normal caliber  small bowel filled with enteric contrast. Questionable rectal wall thickening and adjacent stranding/fluid though this area is poorly evaluated due to streak artifact. Right lower quadrant ileostomy. Vascular/Lymphatic: Aortic atherosclerosis. No enlarged abdominal or pelvic lymph nodes. Reproductive: Mild enlargement of the prostate. Other: Right abdominal percutaneous drain within a intermediate density fluid collection along the anterior right abdominal wall tracking inferiorly to the superior aspect of the bladder and superiorly to the undersurface of the right hepatic  lobe. This measures approximately 3.6 x 2.0 x 11.4 cm (series 2/image 39 and series 5/image 29). This is slightly decreased in size compared to 07/07/2022 when it measured approximately 4.2 x 2.4 x 15 cm. The gaseous component of the fluid collection has resolved. Mesenteric edema. Small amount of free fluid in the pelvis. Body wall anasarca. Musculoskeletal: Left TKA. No acute osseous abnormality. Advanced thoracolumbar spondylosis. Grade 1 anterolisthesis L4. IMPRESSION: 1. The percutaneous right abdominal wall pigtail catheter remains in the fluid collection in the right hemiabdomen which has slightly decreased in size. 2. The bladder is catheterized but remains distended with air and gas. Questionable layering hyperdense fluid within the posterior bladder though this is obscured by streak artifact. Recommend clinical correlation for catheter dysfunction and possible blood clot within the bladder. 3. Thickened bladder wall.  Cystitis not excluded. 4. 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 5. The rectum may be mildly thickened with adjacent stranding/fluid. Evaluation is limited by streak artifact. Findings may be due to congestion or proctitis. 6. 2 cm cystic lesion at the junction of the body and tail of the pancreas. MRI without and with contrast is recommended when clinically feasible. Benign and malignant etiologies are possible. 7. Mesenteric edema and small amount of free fluid in the pelvis. Marked body wall anasarca. Findings may be related to fluid overload. 8. Improved left lower lobe pneumonia. Residual small left pleural effusion. 9. Coronary artery and Aortic Atherosclerosis (ICD10-I70.0). Electronically Signed   By: Minerva Fester M.D.   On: 07/13/2022 22:02   EEG adult now  Result Date: 07/10/2022 Jefferson Fuel, MD     07/10/2022  8:27 AM Routine EEG Report Sukhman Essa Malachi is a 76 y.o. male with a history of staring spells concerning for seizure who is  undergoing an EEG to evaluate for seizures. Report: This EEG was acquired with electrodes placed according to the International 10-20 electrode system (including Fp1, Fp2, F3, F4, C3, C4, P3, P4, O1, O2, T3, T4, T5, T6, A1, A2, Fz, Cz, Pz). The following electrodes were missing or displaced: none. The occipital dominant rhythm was 5-6 Hz. This activity is reactive to stimulation. Drowsiness was manifested by background fragmentation; deeper stages of sleep were not identified. There was bifrontal slowing and intermittent periods lasting 1-2 seconds of diffuse delta slowing. There were no interictal epileptiform discharges. There were no electrographic seizures identified. There was no abnormal response to photic stimulation or hyperventilation. Impression and clinical correlation: This EEG was obtained while awake and drowsy and is abnormal due to: - moderate to severe diffuse slowing indicative of global cerebral dysfunction - bifrontal focal slowing indicative of focal cerebral dysfunction in these regions Epileptiform abnormalities were not seen during this recording. Bing Neighbors, MD [facility] [phone] If 7pm- 7am, please page neurology on call as listed in AMION.   US Carotid Bilateral (at [facility] and [facility] only)  Result Date: 07/08/2022 CLINICAL DATA:  Dementia EXAM: BILATERAL CAROTID DUPLEX ULTRASOUND TECHNIQUE: Wallace Cullens scale imaging, color Doppler and duplex ultrasound were  performed of bilateral carotid and vertebral arteries in the neck. COMPARISON:  None Available. FINDINGS: Criteria: Quantification of carotid stenosis is based on velocity parameters that correlate the residual internal carotid diameter with NASCET-based stenosis levels, using the diameter of the distal internal carotid lumen as the denominator for stenosis measurement. The following velocity measurements were obtained: RIGHT ICA: 124 cm/sec CCA: 80 cm/sec SYSTOLIC ICA/CCA RATIO:  1.5 ECA: 93 cm/sec LEFT ICA: 91 cm/sec CCA:  83 cm/sec SYSTOLIC ICA/CCA RATIO:  1.1 ECA: 87 cm/sec RIGHT CAROTID ARTERY: Patent without plaque or intimal thickening RIGHT VERTEBRAL ARTERY:  Patent with antegrade flow LEFT CAROTID ARTERY:  Patent without plaque or intimal thickening LEFT VERTEBRAL ARTERY:  Patent with antegrade flow IMPRESSION: 1. Negative study. No carotid stenosis identified in the neck by Doppler criteria. 2. The vertebral arteries are patent with antegrade flow. Electronically Signed   By: Agustin Cree M.D.   On: 07/08/2022 09:17   CT CHEST ABDOMEN PELVIS W CONTRAST  Result Date: 07/07/2022 CLINICAL DATA:  Recent surgery for perforated bowel at some point last month, presents with acute abdominal pain with leukocytosis. EXAM: CT CHEST, ABDOMEN, AND PELVIS WITH CONTRAST TECHNIQUE: Multidetector CT imaging of the chest, abdomen and pelvis was performed following the standard protocol during bolus administration of intravenous contrast. RADIATION DOSE REDUCTION: This exam was performed according to the departmental dose-optimization program which includes automated exposure control, adjustment of the mA and/or kV according to patient size and/or use of iterative reconstruction technique. CONTRAST:  80mL OMNIPAQUE IOHEXOL 300 MG/ML  SOLN COMPARISON:  Portable chest today, chest radiograph 07/06/2019. No prior chest CT. Comparison CT abdomen and pelvis without contrast is available dated 07/06/2019. FINDINGS: CT CHEST FINDINGS Cardiovascular: The cardiac size is normal. There is no pericardial effusion. There are scattered calcific plaques in the LAD coronary artery. There are scattered calcifications of the aortic valve leaflets, mild calcific plaques in the aortic arch and subclavian arteries. There is no aortic or great vessel aneurysm, dissection or stenosis. The pulmonary arteries and veins are normal caliber. The pulmonary arteries are centrally clear but not evaluated distal to the hila. Mediastinum/Nodes: No enlarged mediastinal,  hilar, or axillary lymph nodes. Thyroid gland, trachea, and esophagus demonstrate no significant findings. Lungs/Pleura: Eventration and asymmetric elevation noted left hemidiaphragm. There is patchy airspace disease in the left lower lobe consistent with pneumonia or aspiration with small layering parapneumonic pleural effusion without evidence of empyema. There is no right pleural effusion. No pneumothorax or pleural thickening. Respiratory motion is noted on exam. Central airways and remaining lungs are clear, as visualized. Musculoskeletal: Moderate bilateral shoulder DJD. Mild dextroscoliosis of advanced marginal osteophytosis thoracic spine. 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 No acute or other significant osseous findings. No chest wall mass is seen. CT ABDOMEN PELVIS FINDINGS Technical note: Limited image resolution in the abdomen and pelvis due to patient motion, breathing motion, and streak artifact from the patient's arms in the field and overlying wires. Hepatobiliary: No obvious abnormality. There are tiny stones in the gallbladder proximally but no wall thickening. The bile ducts are normal caliber. Pancreas: No solid mass enhancement. There is a 2 cm homogeneous rounded cystic lesion at the junction of the body and tail of the pancreas, posterior aspect, Hounsfield density of 7 units (see series 2, axial images 54-56). This could be a pseudocyst, serous or mucinous cystadenoma or side branch IPMN. Other etiologies such as malignancy are  possible especially given the motion and streak artifacts through the area. MRI without and with contrast recommended when clinically feasible. There is no pancreatic ductal dilatation or other focal abnormality. Spleen: Not well seen but no obvious abnormality.  No splenomegaly. Adrenals/Urinary Tract: There is no adrenal mass. There is a 1 cm homogeneous  cyst in the posterior aspect of the left kidney, Hounsfield density of 7 units. There are a few additional bilateral scattered cortical hypodensities which are too small to characterize. No follow-up imaging is recommended. For reference see JACR 2018 Feb; 264-273, Management of the Incidental Renal Mass on CT, [employer] 2021; 814-848, Bosniak Classification of Cystic Renal Masses, Version 2019. There is bilateral mild renal cortical thinning. There are punctate nonobstructive caliceal stones posteriorly in left kidney. No nephrolithiasis is seen on the right. No hydronephrosis or ureteral stones. The bladder is catheterized but despite this is still distended with air and fluid. Clinical correlation recommended for catheter dysfunction. There is mild bladder thickening versus underdistention. Stomach/Bowel: The patient appears to have undergone a near total colectomy. The rectum remains and is sutured closed in the presacral space, with mild rectal wall thickening and perirectal stranding, the latter which could be congestive or related to fluid overload or proctitis. 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 A pigtail catheter is noted entering the lateral right mid abdominal wall with the pigtail within a right mid abdominal small bowel segment. There are a few slightly dilated left mid abdominal small bowel segments, maximum caliber 2.9 cm, unknown if this is due to partial small bowel obstruction or ileus but there is no bowel dilatation elsewhere in the abdomen or pelvis. A transitional segment could not be found. Vascular/Lymphatic: There is diffuse mesenteric haziness. Unclear whether related to fluid overload, congestive, malnutrition or hepatic dysfunction. The abdominal aorta is unremarkable. There are patchy calcific plaques in the common iliac and internal iliac  arteries. The portal vein is normal caliber and patent. Accounting for patient motion no enlarged lymph nodes are seen throughout. Reproductive: The prostate is not well seen due to streak artifact from left hip replacement, but transversely it measures enlarged at 5.4 cm. Seminal vesicles are normal in size. Both testicles are in the scrotal sac and there are small scrotal hydroceles. Other: Minimal abdominal and pelvic ascites. There is no free air, abscess or free hemorrhage. Presence of diffuse mesenteric edema limits assessment for focal inflammatory change. There has been interval weight loss since 07/06/2019 with reduction in the intra-abdominal and subcutaneous body fat stores. There is mild body wall anasarca. Musculoskeletal: Left THA. Moderately advanced right hip DJD. There is dextroscoliosis and advanced degenerative change of the lumbar spine. Advanced facet hypertrophy L3-4 down is noted with degenerative grade 1 anterolisthesis at L4-5 and L5-S1 and severe acquired foraminal and spinal canal stenosis at both levels. 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 There is no destructive bone lesion. IMPRESSION: 1. Left lower lobe pneumonia or aspiration with small layering parapneumonic effusion. 2. Aortic and coronary artery atherosclerosis. No thoracic or abdominal aortic aneurysm. 3. Aortic valve leaflet calcifications. Echocardiography may be helpful to assess for aortic stenosis. 4. Diffuse mesenteric haziness/edema. Unclear whether this is related to fluid overload, congestive, malnutrition or hepatic dysfunction. Only minimal ascites. No free air. 5. Cholelithiasis without evidence of cholecystitis. 6. Nonobstructive micronephrolithiasis. 7. 2 cm cystic lesion at the junction of the  body and tail of the pancreas. MRI without and with contrast recommended when clinically feasible. Benign and malignant etiologies are possible. 8. 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  No parastomal hernia. 9. Slightly dilated left mid abdominal small bowel segments, unknown if this is due to partial SBO or ileus. 10. The rectum remains, and there is rectal thickening and surrounding stranding which could be congestive or due to proctitis. 11. Thickened stomach and scattered mid to lower abdominal small bowel segments which could be due to gastroenteritis or congestive. 12. Bladder catheterized but despite this is still distended with air and fluid. Clinical correlation recommended for catheter dysfunction. Cystitis versus bladder nondistention. 13. Prostatomegaly. 14. 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 15. Remaining findings described above. Electronically Signed   By: Almira Bar M.D.   On: 07/07/2022 20:41   CT Head Wo Contrast  Result Date: 07/07/2022 CLINICAL DATA:  Mental status change. EXAM: CT HEAD WITHOUT CONTRAST TECHNIQUE: Contiguous axial images were obtained from the base of the skull through the vertex without intravenous contrast. RADIATION DOSE REDUCTION: This exam was performed according to the departmental dose-optimization program which includes automated exposure control, adjustment of the mA and/or kV according to patient size and/or use of iterative reconstruction technique. COMPARISON:  Jan 11, 2022 CT scan the brain FINDINGS: Brain: Ventricles and sulci are prominent but stable. No mass effect or midline shift. No cyst subdural, epidural, or subarachnoid hemorrhage. Cerebellum, brainstem, and basal cisterns are normal. Scattered white matter changes are identified. No acute cortical ischemia or infarct. Vascular: No hyperdense vessel or unexpected calcification. Skull: Normal. Negative for fracture or focal lesion. Sinuses/Orbits: No acute finding. Other: None. IMPRESSION: Chronic white matter changes. No acute intracranial abnormalities. Electronically Signed   By: Gerome Sam III M.D.   On: 07/07/2022 17:18   DG Chest Portable 1 View  Result Date: 07/07/2022 CLINICAL DATA:  Shortness of breath and tachycardia. EXAM: PORTABLE CHEST 1 VIEW COMPARISON:  07/06/2019 FINDINGS: The cardiomediastinal silhouette is unremarkable. Elevation of the LEFT hemidiaphragm with mild LEFT basilar atelectasis/scarring again noted. There is no evidence of focal airspace disease, pulmonary edema, suspicious pulmonary nodule/mass, pleural effusion, or pneumothorax. No acute bony abnormalities are identified. IMPRESSION: No active disease. Electronically Signed   By: Harmon Pier M.D.   On: 07/07/2022 16:37    Microbiology: Results for orders placed or performed during the hospital encounter of 07/21/22  Urine Culture     Status: None   Collection Time: 07/21/22 10:45 PM   Specimen: In/Out Cath Urine  Result Value Ref Range Status   Specimen Description   Final    IN/OUT CATH URINE Performed at [facility], [address] [place], [place] [address]    Special Requests   Final    NONE Performed at [facility], [address], [place], [place] [address]    Culture   Final    NO GROWTH Performed at [facility], [address] [place], [place] [address]    Report Status 07/23/2022 FINAL  Final  Resp Panel by RT-PCR (Flu A&B, Covid) Anterior Nasal Swab     Status: None   Collection Time: 07/21/22 10:52 PM   Specimen: Anterior Nasal Swab  Result Value Ref Range Status   SARS Coronavirus 2 by RT PCR NEGATIVE NEGATIVE Final    Comment: (NOTE) SARS-CoV-2 target nucleic acids are NOT  DETECTED.  The SARS-CoV-2 RNA is generally detectable in upper respiratory specimens during the acute phase of infection. The lowest concentration of SARS-CoV-2 viral copies this assay can detect is 138 copies/mL. A negative result does not preclude SARS-Cov-2 infection and should not be used as the sole basis for treatment or other patient management decisions. A  negative result may occur with  improper specimen collection/handling, submission of specimen other than nasopharyngeal swab, presence of viral mutation(s) within the areas targeted by this assay, and inadequate number of viral copies(<138 copies/mL). A negative result must be combined with clinical observations, patient history, and epidemiological information. The expected result is Negative.  Fact Sheet for Patients:  BloggerCourse.comhttps://www.fda.gov/media/152166/download  Fact Sheet for Healthcare Providers:  SeriousBroker.ithttps://www.fda.gov/media/152162/download  This test is no t yet approved or cleared by the [place] FDA and  has been authorized for detection and/or diagnosis of SARS-CoV-2 by FDA under an Emergency Use Authorization (EUA). This EUA will remain  in effect (meaning this test can be used) for the duration of the COVID-19 declaration under Section 564(b)(1) of the Act, 21 U.S.C.section 360bbb-3(b)(1), unless the authorization is terminated  or revoked sooner.       Influenza A by PCR NEGATIVE NEGATIVE Final   Influenza B by PCR NEGATIVE NEGATIVE Final    Comment: (NOTE) The Xpert Xpress SARS-CoV-2/FLU/RSV plus assay is intended as an aid in the diagnosis of influenza from Nasopharyngeal swab specimens and should not be used as a sole basis for treatment. Nasal washings and aspirates are unacceptable for Xpert Xpress SARS-CoV-2/FLU/RSV testing.  Fact Sheet for Patients: BloggerCourse.comhttps://www.fda.gov/media/152166/download  Fact Sheet for Healthcare Providers: SeriousBroker.ithttps://www.fda.gov/media/152162/download  This test is not yet approved or cleared by the [place] FDA and has been authorized for detection and/or diagnosis of SARS-CoV-2 by FDA under an Emergency Use Authorization (EUA). This EUA will remain in effect (meaning this test can be used) for the duration of the COVID-19 declaration under Section 564(b)(1) of the Act, 21 U.S.C. section 360bbb-3(b)(1), unless the authorization  is terminated or revoked.  Performed at [facility], [address] [place], [place] [address]   Blood Culture (routine x 2)     Status: Abnormal   Collection Time: 07/21/22 11:31 PM   Specimen: BLOOD RIGHT ARM  Result Value Ref Range Status   Specimen Description   Final    BLOOD RIGHT ARM Performed at [facility], [address] [place], [place] [address]    Special Requests   Final    Blood Culture adequate volume Performed at [facility], [address] [place], [place] [address]    Culture  Setup Time   Final    GRAM POSITIVE COCCI ANAEROBIC BOTTLE ONLY CRITICAL RESULT CALLED TO, READ BACK BY AND VERIFIED WITH: ANDERSON MOORE 07/22/22 @ 1446 BY SB GRAM POSITIVE RODS AEROBIC BOTTLE ONLY CRITICAL RESULT CALLED TO, READ BACK BY AND VERIFIED WITH: PHARMD J. ROBINS 07/24/22 @ 0516 BY AB    Culture (A)  Final    ENTEROCOCCUS FAECIUM VANCOMYCIN RESISTANT ENTEROCOCCUS LACTOBACILLUS CASEI Standardized susceptibility testing for this organism is not available. Performed at [facility], [address] [place], [place] [address]    Report Status 07/26/2022 FINAL  Final   Organism ID, Bacteria ENTEROCOCCUS FAECIUM  Final      Susceptibility   Enterococcus faecium - MIC*    AMPICILLIN >=32 RESISTANT Resistant     VANCOMYCIN >=32 RESISTANT Resistant     GENTAMICIN SYNERGY SENSITIVE Sensitive  LINEZOLID 2 SENSITIVE Sensitive     * ENTEROCOCCUS FAECIUM  Blood Culture (routine x 2)     Status: None   Collection Time: 07/21/22 11:31 PM   Specimen: BLOOD RIGHT ARM  Result Value Ref Range Status   Specimen Description BLOOD RIGHT ARM  Final   Special Requests Blood Culture adequate volume  Final   Culture   Final    NO GROWTH 5 DAYS Performed at [facility], [address] [place], [place] [address]    Report Status 07/26/2022 FINAL  Final  Blood Culture ID Panel (Reflexed)     Status: Abnormal   Collection Time: 07/21/22  11:31 PM  Result Value Ref Range Status   Enterococcus faecalis NOT DETECTED NOT DETECTED Final   Enterococcus Faecium DETECTED (A) NOT DETECTED Final    Comment: CRITICAL RESULT CALLED TO, READ BACK BY AND VERIFIED WITH: Delight Stare 07/22/22 @ 1446 BY SB    Listeria monocytogenes NOT DETECTED NOT DETECTED Final   Staphylococcus species NOT DETECTED NOT DETECTED Final   Staphylococcus aureus (BCID) NOT DETECTED NOT DETECTED Final   Staphylococcus epidermidis NOT DETECTED NOT DETECTED Final   Staphylococcus lugdunensis NOT DETECTED NOT DETECTED Final   Streptococcus species NOT DETECTED NOT DETECTED Final   Streptococcus agalactiae NOT DETECTED NOT DETECTED Final   Streptococcus pneumoniae NOT DETECTED NOT DETECTED Final   Streptococcus pyogenes NOT DETECTED NOT DETECTED Final   A.calcoaceticus-baumannii NOT DETECTED NOT DETECTED Final   Bacteroides fragilis NOT DETECTED NOT DETECTED Final   Enterobacterales NOT DETECTED NOT DETECTED Final   Enterobacter cloacae complex NOT DETECTED NOT DETECTED Final   Escherichia coli NOT DETECTED NOT DETECTED Final   Klebsiella aerogenes NOT DETECTED NOT DETECTED Final   Klebsiella oxytoca NOT DETECTED NOT DETECTED Final   Klebsiella pneumoniae NOT DETECTED NOT DETECTED Final   Proteus species NOT DETECTED NOT DETECTED Final   Salmonella species NOT DETECTED NOT DETECTED Final   Serratia marcescens NOT DETECTED NOT DETECTED Final   Haemophilus influenzae NOT DETECTED NOT DETECTED Final   Neisseria meningitidis NOT DETECTED NOT DETECTED Final   Pseudomonas aeruginosa NOT DETECTED NOT DETECTED Final   Stenotrophomonas maltophilia NOT DETECTED NOT DETECTED Final   Candida albicans NOT DETECTED NOT DETECTED Final   Candida auris NOT DETECTED NOT DETECTED Final   Candida glabrata NOT DETECTED NOT DETECTED Final   Candida krusei NOT DETECTED NOT DETECTED Final   Candida parapsilosis NOT DETECTED NOT DETECTED Final   Candida tropicalis NOT DETECTED  NOT DETECTED Final   Cryptococcus neoformans/gattii NOT DETECTED NOT DETECTED Final   Vancomycin resistance DETECTED (A) NOT DETECTED Final    Comment: CRITICAL RESULT CALLED TO, READ BACK BY AND VERIFIED WITH: Delight Stare 07/22/22 @ 1446 BY SB Performed at [facility], [address] [place], [place] [address]   Culture, blood (Routine X 2) w Reflex to ID Panel     Status: None (Preliminary result)   Collection Time: 07/23/22  8:16 PM   Specimen: BLOOD  Result Value Ref Range Status   Specimen Description BLOOD BLOOD RIGHT HAND [facility]  Final   Special Requests   Final    BOTTLES DRAWN AEROBIC AND ANAEROBIC Blood Culture adequate volume   Culture   Final    NO GROWTH 3 DAYS Performed at [facility], [address] [place], [place] [address]    Report Status PENDING  Incomplete  Culture, blood (Routine X 2) w Reflex to ID Panel     Status:  None (Preliminary result)   Collection Time: 07/23/22  8:17 PM   Specimen: BLOOD  Result Value Ref Range Status   Specimen Description BLOOD BLOOD LEFT ARM LAC  Final   Special Requests   Final    BOTTLES DRAWN AEROBIC AND ANAEROBIC Blood Culture adequate volume   Culture   Final    NO GROWTH 3 DAYS Performed at [facility], [address], [place], [place] [address]    Report Status PENDING  Incomplete  Culture, blood (Routine X 2) w Reflex to ID Panel     Status: None (Preliminary result)   Collection Time: 07/24/22  1:55 AM   Specimen: BLOOD  Result Value Ref Range Status   Specimen Description BLOOD BLOOD LEFT FOREARM  Final   Special Requests Blood Culture adequate volume  Final   Culture   Final    NO GROWTH 2 DAYS Performed at [facility], [address] [place], [place] [address]    Report Status PENDING  Incomplete  Culture, blood (Routine X 2) w Reflex to ID Panel     Status: None (Preliminary result)   Collection Time: 07/24/22  1:55 AM   Specimen: BLOOD  Result Value Ref Range  Status   Specimen Description BLOOD BLOOD RIGHT HAND  Final   Special Requests Blood Culture adequate volume  Final   Culture   Final    NO GROWTH 2 DAYS Performed at [facility], [address], [place], [place] [address]    Report Status PENDING  Incomplete  Aerobic/Anaerobic Culture w Gram Stain (surgical/deep wound)     Status: None (Preliminary result)   Collection Time: 07/25/22  9:39 AM   Specimen: Abdomen; Abscess  Result Value Ref Range Status   Specimen Description   Final    ABDOMEN Performed at [facility], [address] [place], [place] [address]    Special Requests   Final    NONE Performed at [facility], [address] [place], [place] [address]    Gram Stain   Final    FEW WBC PRESENT,BOTH PMN AND MONONUCLEAR NO ORGANISMS SEEN    Culture   Final    NO GROWTH < 24 HOURS Performed at [facility], [address], [place], [place] [address]    Report Status PENDING  Incomplete    Labs: CBC: Recent Labs  Lab 07/21/22 2001 07/22/22 0631 07/23/22 1044 07/26/22 0526  WBC 6.7 6.6 6.1  --   NEUTROABS 4.5  --  3.8  --   HGB 8.3* 7.7* 8.1* 7.9*  HCT 26.3* 23.5* 25.9*  --   MCV 88.3 85.5 88.4  --   PLT 302 250 263  --    Basic Metabolic Panel: Recent Labs  Lab 07/21/22 2001 07/22/22 0631 07/23/22 1044 07/26/22 0526  NA 146* 144 142 142  K 3.7 3.6 3.7 3.9  CL 117* 120* 117* 114*  CO2 GLUCOSE 87 85 88 97  BUN CREATININE 1.47* 1.17 1.20 1.19  CALCIUM 9.8 9.5 9.3 9.2  MG 2.0  --   --   --    Liver Function Tests: Recent Labs  Lab 07/23/22 1043  AST 18  ALT 11  ALKPHOS 68  BILITOT 0.6  PROT 5.6*  ALBUMIN 2.2*   CBG: Recent Labs  Lab 07/23/22 0354 07/23/22 0408 07/23/22 0757 07/23/22 1125 07/23/22 1705  GLUCAP 69* 72 92 83 81  Discharge time spent: greater than 30 minutes.  Signed: Alford Highland, MD [facility] 07/26/2022

## 2022-07-27 DIAGNOSIS — N179 Acute kidney failure, unspecified: Secondary | ICD-10-CM | POA: Diagnosis not present

## 2022-07-27 DIAGNOSIS — K651 Peritoneal abscess: Secondary | ICD-10-CM | POA: Diagnosis not present

## 2022-07-27 DIAGNOSIS — R7881 Bacteremia: Secondary | ICD-10-CM | POA: Diagnosis not present

## 2022-07-27 DIAGNOSIS — I9589 Other hypotension: Secondary | ICD-10-CM | POA: Diagnosis not present

## 2022-07-27 MED ORDER — SODIUM CHLORIDE 0.9 % IR SOLN: 5.0000 mL | Freq: Three times a day (TID) | 0 refills | Status: DC

## 2022-07-27 NOTE — Progress Notes (Signed)
Referring Physician(s): Dr. Georgeann OppenheimSreenath   Supervising Physician: Pernell DupreEl-Abd, Yasser J  Patient Status:  [facility]  Chief Complaint: RLQ intra-abdominal abscess s/p drain placement in IR 07/25/22 by Dr. Elby ShowersSuttle   Subjective: Patient in bed eating his breakfast. He seems pleasantly confused. He denies any pain/discomfort. He is going home with home health today.   Allergies: Morphine  Medications: Prior to Admission medications   Medication Sig Start Date End Date Taking? Authorizing Provider  finasteride (PROSCAR) 5 MG tablet Take 5 mg by mouth daily. 07/06/22 08/05/22 Yes [provider]  sertraline (ZOLOFT) 25 MG tablet Take 25 mg by mouth daily.   Yes [provider]  tamsulosin (FLOMAX) 0.4 MG CAPS capsule Take 0.4 mg by mouth daily. 07/06/22 08/05/22 Yes [provider]  ascorbic acid (VITAMIN C) 500 MG tablet Take 1 tablet (500 mg total) by mouth 2 (two) times daily. 07/26/22   Alford HighlandWieting, Richard, MD  feeding supplement (ENSURE ENLIVE / ENSURE PLUS) LIQD Take 237 mLs by mouth 3 (three) times daily between meals. 07/26/22   Alford HighlandWieting, Richard, MD  furosemide (LASIX) 40 MG tablet Take 1 tablet (40 mg total) by mouth daily as needed. As needed for leg edema 05/09/22 06/09/23  Furth, Cadence H, PA-C  linezolid (ZYVOX) 600 MG tablet Take 1 tablet (600 mg total) by mouth every 12 (twelve) hours for 7 days. 07/26/22 08/02/22  Alford HighlandWieting, Richard, MD  Multiple Vitamin (MULTIVITAMIN WITH MINERALS) TABS tablet Take 1 tablet by mouth daily. 07/27/22   Alford HighlandWieting, Richard, MD  zinc sulfate 220 (50 Zn) MG capsule Take 1 capsule (220 mg total) by mouth daily. 07/27/22   Alford HighlandWieting, Richard, MD     Vital Signs: BP 130/78 (BP Location: Left Arm)   Pulse (!) 104   Temp 97.7 F (36.5 C)   Resp 16   Ht 5\' 7"  (1.702 m)   Wt 128 lb 9.6 oz (58.3 kg)   SpO2 (!) 78%   BMI 20.14 kg/m   Physical Exam Constitutional:      General: He is not in acute distress.    Appearance: He is not  ill-appearing.  Pulmonary:     Effort: Pulmonary effort is normal.  Abdominal:     Tenderness: There is no abdominal tenderness.     Comments: RUQ drain to suction. Approximately 10-15 ml of serosanguineous fluid in bulb. Drain easily flushed with 5 ml NS. Dressing is clean/dry/intact. Patient denies tenderness to palpation.   Skin:    General: Skin is warm and dry.  Neurological:     Mental Status: He is alert. He is disoriented.     Imaging: CT GUIDED PERITONEAL/RETROPERITONEAL FLUID DRAIN BY PERC CATH  Result Date: 07/25/2022 INDICATION: 76 year old male with history of perforated bowel with postoperative abdominal fluid collection concerning for abscess. EXAM: CT PERC DRAIN PERITONEAL ABCESS COMPARISON:  07/23/2022 MEDICATIONS: The patient is currently admitted to the hospital and receiving intravenous antibiotics. The antibiotics were administered within an appropriate time frame prior to the initiation of the procedure. ANESTHESIA/SEDATION: Moderate (conscious) sedation was employed during this procedure. A total of Versed 0 mg and Fentanyl 12.5 mcg was administered intravenously. Moderate Sedation Time: 17 minutes. The patient's level of consciousness and vital signs were monitored continuously by radiology nursing throughout the procedure under my direct supervision. CONTRAST:  None COMPLICATIONS: None immediate. PROCEDURE: RADIATION DOSE REDUCTION: This exam was performed according to the departmental dose-optimization program which includes automated exposure control, adjustment of the mA and/or kV according to  patient size and/or use of iterative reconstruction technique. Informed written consent was obtained from the patient after a discussion of the risks, benefits and alternatives to treatment. The patient was placed on the CT gantry and a pre procedural CT was performed re-demonstrating the known abscess/fluid collection within the right lower quadrant. The procedure was planned. A  timeout was performed prior to the initiation of the procedure. The right lower quadrant was prepped and draped in the usual sterile fashion. The overlying soft tissues were anesthetized with 1% lidocaine with epinephrine. Appropriate trajectory was planned with the use of a 22 gauge spinal needle. An 18 gauge trocar needle was advanced into the abscess/fluid collection and a short Amplatz super stiff wire was coiled within the collection. Appropriate positioning was confirmed with a limited CT scan. The tract was serially dilated allowing placement of a 10 [place] all-purpose drainage catheter. Appropriate positioning was confirmed with a limited postprocedural CT scan. Approximately 3 ml of sanguino-purulent fluid was aspirated. The tube was connected to a bulb suction and sutured in place. A dressing was placed. The patient tolerated the procedure well without immediate post procedural complication. IMPRESSION: Successful CT guided placement of a 10 French all purpose drain catheter into the right upper quadrant with aspiration of approximately 3 mL of sanguino-purulent fluid. Samples were sent to the laboratory as requested by the ordering clinical team. Marliss Coots, MD [facility] Electronically Signed   By: Marliss Coots M.D.   On: 07/25/2022 10:43   CT ABDOMEN PELVIS W CONTRAST  Addendum Date: 07/24/2022   ADDENDUM REPORT: 07/24/2022 13:09 ADDENDUM: At the request of the referring provider, the craniocaudal dimension of the patient's residual abscess is provided, 7.0 cm, compared to 7.8 cm on 07/13/2022. Electronically Signed   By: Leanna Battles M.D.   On: 07/24/2022 13:09   Result Date: 07/24/2022 CLINICAL DATA:  Acute onset hypotension with decreased urinary output. Abscess. EXAM: CT ABDOMEN AND PELVIS WITH CONTRAST TECHNIQUE: Multidetector CT imaging of the abdomen and pelvis was performed using the standard protocol following bolus  administration of intravenous contrast. RADIATION DOSE REDUCTION: This exam was performed according to the departmental dose-optimization program which includes automated exposure control, adjustment of the mA and/or kV according to patient size and/or use of iterative reconstruction technique. CONTRAST:  OMNIPAQUE IOHEXOL 300 MG/ML  SOLN COMPARISON:  07/13/2022. FINDINGS: Lower chest: New small right pleural effusion. Moderate left pleural effusion, increased. Compressive atelectasis in both lower lobes. Atherosclerotic calcification of the aortic valve and coronary arteries. Heart is at the upper limits of normal in size to mildly enlarged. No pericardial effusion. Distal esophagus is grossly unremarkable. Hepatobiliary: Probable subcentimeter cysts in the liver. Liver is otherwise unremarkable. Stone in the gallbladder. Small pericholecystic fluid. No biliary ductal dilatation. Pancreas: 1.4 cm low-attenuation lesion in the pancreatic body, stable. No gland atrophy or ductal dilatation. Spleen: Negative. Adrenals/Urinary Tract: Renal parenchymal thinning bilaterally. Subcentimeter low-attenuation lesion in the left kidney. No specific follow-up necessary. Kidneys are otherwise unremarkable. Ureters are decompressed. Foley catheter is seen in a decompressed bladder. Bladder wall may be thickened. Stomach/Bowel: Stomach and majority of the small bowel are unremarkable. 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 Persistent rectal wall thickening with presacral edema and fluid, as on 07/13/2022. Vascular/Lymphatic: Retroaortic left renal vein. Atherosclerotic calcification of the aorta. Fusiform dilatation of the celiac trunk, 10 mm. No pathologically enlarged lymph nodes. Reproductive: Prostate is visualized. Other: No additional free fluid. Well-circumscribed fluid collection in  the ventral right lower quadrant measures 2.2 x 3.7 cm (2/45), previously 2.4 x 4.5 cm on 07/13/2022, at which time a  percutaneous drain was in place. No additional evidence of an abscess. Diffuse body wall edema. Musculoskeletal: Left hip arthroplasty. Right hip osteoarthritis. Degenerative changes in the spine. Grade 1 anterolisthesis of L5 on S1. IMPRESSION: 1. Residual abscess in the right lower quadrant, slightly smaller than on 07/13/2022. Interval percutaneous drain removal. 2. New small right pleural effusion. Moderate left pleural effusion, increased. Compressive atelectasis in both lower lobes. 3. Persistent rectal wall thickening with extensive presacral edema/fluid, unchanged. 4. Bladder wall thickening, as before. 5. Diffuse body wall edema. 6. Cholelithiasis. 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 8. 1.4 cm cystic lesion in body of the pancreas, as before. Given patient condition and age, no specific follow-up is recommended other than on routine imaging. 9. Aortic atherosclerosis (ICD10-I70.0). Coronary artery calcification. Electronically Signed: By: Leanna Battles M.D. On: 07/23/2022 15:34    Labs:  CBC: Recent Labs    07/15/22 0430 07/21/22 2001 07/22/22 0631 07/23/22 1044 07/26/22 0526  WBC 8.3 6.7 6.6 6.1  --   HGB 8.4* 8.3* 7.7* 8.1* 7.9*  HCT 25.3* 26.3* 23.5* 25.9*  --   PLT 174 302 250 263  --     COAGS: No results for input(s): "INR", "APTT" in the last 8760 hours.  BMP: Recent Labs    07/21/22 2001 07/22/22 0631 07/23/22 1044 07/26/22 0526  NA 146* 144 142 142  K 3.7 3.6 3.7 3.9  CL 117* 120* 117* 114*  CO2 24 22 23 26   GLUCOSE 87 85 88 97  BUN 19 16 15 12   CALCIUM 9.8 9.5 9.3 9.2  CREATININE 1.47* 1.17 1.20 1.19  GFRNONAA 49* >60 >60 >60    LIVER FUNCTION TESTS: Recent Labs    08/03/21 1043 01/11/22 1350 07/07/22 1633 07/23/22 1043  BILITOT 0.6 0.8 0.8 0.6  AST 11 15 35 18  ALT 6 10 20 11   ALKPHOS 89 60 127* 68  PROT 6.6 6.8 8.7* 5.6*  ALBUMIN 4.3 3.9 2.8* 2.2*    Assessment and Plan:  RLQ intra-abdominal abscess s/p drain placement in IR 07/25/22  by Dr. 14/04/23. Patient is afebrile and without leukocytosis.   Drain Location: RUQ Size: Fr size: 10 Fr Date of placement: 07/25/22 Currently to: Drain collection device: suction bulb 24 hour output:  Output by Drain (mL) 07/25/22 0700 - 07/25/22 1459 07/25/22 1500 - 07/25/22 2259 07/25/22 2300 - 07/26/22 0659 07/26/22 0700 - 07/26/22 1459 07/26/22 1500 - 07/26/22 2259 07/26/22 2300 - 07/27/22 0659 07/27/22 0700 - 07/27/22 1043  Closed System Drain Lateral;Right RLQ Bulb (JP)   10  5      Interval imaging/drain manipulation:  none  Current examination: Flushes/aspirates easily.  Insertion site unremarkable. Suture and stat lock in place. Dressed appropriately.   Plan: Continue TID flushes with 5 cc NS. Record output Q shift. Dressing changes QD or PRN if soiled.  Call IR APP or on call IR MD if difficulty flushing or sudden change in drain output.  Repeat imaging/possible drain injection once output < 10 mL/QD (excluding flush material). Consideration for drain removal if output is < 10 mL/QD (excluding flush material), pending discussion with the providing surgical service.  Discharge planning: Patient with plans for discharge home with home health today. An order has been placed for a scheduler to call the patient with a date/time of his appointment. According to the bedside nurse the patient's  wife was shown how to flush the drain. When I saw the patient today I delivered enough saline flushes to last until the patient's follow up with [place]. Drain care instructions were placed on the discharge paperwork.   IR will continue to follow - please call with questions or concerns.  Electronically Signed: Alwyn Ren, AGACNP-BC [phone] 07/27/2022, 10:43 AM   I spent a total of 15 Minutes at the the patient's bedside AND on the patient's hospital floor or unit, greater than 50% of which was counseling/coordinating care for abscess drain.

## 2022-07-27 NOTE — TOC Transition Note (Signed)
Transition of Care [facility]) - CM/SW Discharge Note   Patient Details  Name: Carl Figueroa MRN: [id number] Date of Birth: [birth year]  Transition of Care [facility]) CM/SW Contact:  Truddie Hidden, RN Phone Number: 07/27/2022, 11:43 AM   Clinical Narrative:    61 Spoke with patient's wife regarding discharge today. Patient wife in agreement with discharge and EMS transport home. She was advised [facility] would be in contact with her for resumption of care. She denied any other questions   Face sheet and medical necessity forms place on chart  EMS arranged for 11 am with [facility].  Nurse notified.   TOC signing off   Final next level of care: Home w Home Health Services Barriers to Discharge: Barriers Resolved   Patient Goals and CMS Choice Patient states their goals for this hospitalization and ongoing recovery are:: DC home CMS Medicare.gov Compare Post Acute Care list provided to:: Patient Represenative (must comment) Choice offered to / list presented to : Spouse  Discharge Placement                Patient to be transferred to facility by: EMS to take home Name of family member notified: Wille Celeste, Spouse Patient and family notified of of transfer: 07/23/22  Discharge Plan and Services                DME Arranged: N/A DME Agency: NA       HH Arranged: RN, PT, OT, Social Work [employer] Agency: [facility] Date HH Agency Contacted: 07/23/22 Time HH Agency Contacted: [id number] Representative spoke with at [facility] Agency: Barbara Cower  Social Determinants of Health (SDOH) Interventions     Readmission Risk Interventions     No data to display

## 2022-07-28 LAB — CULTURE, BLOOD (ROUTINE X 2)
Culture: NO GROWTH
Culture: NO GROWTH
Special Requests: ADEQUATE
Special Requests: ADEQUATE

## 2022-07-29 LAB — CULTURE, BLOOD (ROUTINE X 2)
Culture: NO GROWTH
Culture: NO GROWTH
Special Requests: ADEQUATE
Special Requests: ADEQUATE

## 2022-07-30 LAB — AEROBIC/ANAEROBIC CULTURE W GRAM STAIN (SURGICAL/DEEP WOUND): Culture: NO GROWTH

## 2022-08-06 ENCOUNTER — Encounter: Payer: Self-pay | Admitting: Radiology

## 2022-08-06 ENCOUNTER — Emergency Department: Payer: No Typology Code available for payment source

## 2022-08-06 ENCOUNTER — Inpatient Hospital Stay
Admission: EM | Admit: 2022-08-06 | Discharge: 2022-08-09 | DRG: 640 | Disposition: A | Discharge status: Home Health | Payer: No Typology Code available for payment source | Attending: Internal Medicine | Admitting: Internal Medicine

## 2022-08-06 ENCOUNTER — Other Ambulatory Visit: Payer: Self-pay

## 2022-08-06 DIAGNOSIS — F32A Depression, unspecified: Secondary | ICD-10-CM | POA: Diagnosis present

## 2022-08-06 DIAGNOSIS — R339 Retention of urine, unspecified: Secondary | ICD-10-CM | POA: Diagnosis present

## 2022-08-06 DIAGNOSIS — Z96642 Presence of left artificial hip joint: Secondary | ICD-10-CM | POA: Diagnosis present

## 2022-08-06 DIAGNOSIS — F0284 Dementia in other diseases classified elsewhere, unspecified severity, with anxiety: Secondary | ICD-10-CM | POA: Diagnosis present

## 2022-08-06 DIAGNOSIS — E875 Hyperkalemia: Secondary | ICD-10-CM | POA: Diagnosis present

## 2022-08-06 DIAGNOSIS — G9341 Metabolic encephalopathy: Secondary | ICD-10-CM | POA: Diagnosis present

## 2022-08-06 DIAGNOSIS — L89302 Pressure ulcer of unspecified buttock, stage 2: Secondary | ICD-10-CM | POA: Insufficient documentation

## 2022-08-06 DIAGNOSIS — Z87891 Personal history of nicotine dependence: Secondary | ICD-10-CM

## 2022-08-06 DIAGNOSIS — Z79899 Other long term (current) drug therapy: Secondary | ICD-10-CM

## 2022-08-06 DIAGNOSIS — Z1152 Encounter for screening for COVID-19: Secondary | ICD-10-CM

## 2022-08-06 DIAGNOSIS — R4182 Altered mental status, unspecified: Secondary | ICD-10-CM

## 2022-08-06 DIAGNOSIS — Z932 Ileostomy status: Secondary | ICD-10-CM

## 2022-08-06 DIAGNOSIS — E860 Dehydration: Principal | ICD-10-CM | POA: Diagnosis present

## 2022-08-06 DIAGNOSIS — E785 Hyperlipidemia, unspecified: Secondary | ICD-10-CM | POA: Diagnosis present

## 2022-08-06 DIAGNOSIS — G3183 Dementia with Lewy bodies: Secondary | ICD-10-CM | POA: Diagnosis present

## 2022-08-06 DIAGNOSIS — F0283 Dementia in other diseases classified elsewhere, unspecified severity, with mood disturbance: Secondary | ICD-10-CM | POA: Diagnosis present

## 2022-08-06 DIAGNOSIS — Z8674 Personal history of sudden cardiac arrest: Secondary | ICD-10-CM

## 2022-08-06 DIAGNOSIS — Z885 Allergy status to narcotic agent status: Secondary | ICD-10-CM

## 2022-08-06 DIAGNOSIS — I10 Essential (primary) hypertension: Secondary | ICD-10-CM | POA: Diagnosis not present

## 2022-08-06 DIAGNOSIS — D638 Anemia in other chronic diseases classified elsewhere: Secondary | ICD-10-CM | POA: Diagnosis present

## 2022-08-06 DIAGNOSIS — N401 Enlarged prostate with lower urinary tract symptoms: Secondary | ICD-10-CM | POA: Diagnosis present

## 2022-08-06 DIAGNOSIS — N179 Acute kidney failure, unspecified: Secondary | ICD-10-CM | POA: Diagnosis not present

## 2022-08-06 DIAGNOSIS — R338 Other retention of urine: Secondary | ICD-10-CM | POA: Diagnosis present

## 2022-08-06 DIAGNOSIS — G8929 Other chronic pain: Secondary | ICD-10-CM | POA: Diagnosis present

## 2022-08-06 DIAGNOSIS — N400 Enlarged prostate without lower urinary tract symptoms: Secondary | ICD-10-CM | POA: Diagnosis present

## 2022-08-06 DIAGNOSIS — Z9049 Acquired absence of other specified parts of digestive tract: Secondary | ICD-10-CM

## 2022-08-06 DIAGNOSIS — F0390 Unspecified dementia without behavioral disturbance: Secondary | ICD-10-CM | POA: Insufficient documentation

## 2022-08-06 DIAGNOSIS — Z809 Family history of malignant neoplasm, unspecified: Secondary | ICD-10-CM

## 2022-08-06 DIAGNOSIS — F419 Anxiety disorder, unspecified: Secondary | ICD-10-CM | POA: Diagnosis present

## 2022-08-06 LAB — URINALYSIS, COMPLETE (UACMP) WITH MICROSCOPIC
Bilirubin Urine: NEGATIVE
Glucose, UA: NEGATIVE mg/dL
Hgb urine dipstick: NEGATIVE
Ketones, ur: NEGATIVE mg/dL
Nitrite: NEGATIVE
Protein, ur: 30 mg/dL — AB
Specific Gravity, Urine: 1.035 — ABNORMAL HIGH (ref 1.005–1.030)
pH: 5 (ref 5.0–8.0)

## 2022-08-06 LAB — CBC WITH DIFFERENTIAL/PLATELET
Abs Immature Granulocytes: 0.11 10*3/uL — ABNORMAL HIGH (ref 0.00–0.07)
Basophils Absolute: 0 10*3/uL (ref 0.0–0.1)
Basophils Relative: 0 %
Eosinophils Absolute: 0 10*3/uL (ref 0.0–0.5)
Eosinophils Relative: 0 %
HCT: 28.9 % — ABNORMAL LOW (ref 39.0–52.0)
Hemoglobin: 8.7 g/dL — ABNORMAL LOW (ref 13.0–17.0)
Immature Granulocytes: 1 %
Lymphocytes Relative: 2 %
Lymphs Abs: 0.2 10*3/uL — ABNORMAL LOW (ref 0.7–4.0)
MCH: 27.9 pg (ref 26.0–34.0)
MCHC: 30.1 g/dL (ref 30.0–36.0)
MCV: 92.6 fL (ref 80.0–100.0)
Monocytes Absolute: 0.3 10*3/uL (ref 0.1–1.0)
Monocytes Relative: 3 %
Neutro Abs: 10 10*3/uL — ABNORMAL HIGH (ref 1.7–7.7)
Neutrophils Relative %: 94 %
Platelets: 110 10*3/uL — ABNORMAL LOW (ref 150–400)
RBC: 3.12 MIL/uL — ABNORMAL LOW (ref 4.22–5.81)
RDW: 15.1 % (ref 11.5–15.5)
WBC: 10.6 10*3/uL — ABNORMAL HIGH (ref 4.0–10.5)
nRBC: 0 % (ref 0.0–0.2)

## 2022-08-06 LAB — COMPREHENSIVE METABOLIC PANEL
ALT: 16 U/L (ref 0–44)
AST: 17 U/L (ref 15–41)
Albumin: 2.7 g/dL — ABNORMAL LOW (ref 3.5–5.0)
Alkaline Phosphatase: 67 U/L (ref 38–126)
Anion gap: 5 (ref 5–15)
BUN: 35 mg/dL — ABNORMAL HIGH (ref 8–23)
CO2: 25 mmol/L (ref 22–32)
Calcium: 9.7 mg/dL (ref 8.9–10.3)
Chloride: 113 mmol/L — ABNORMAL HIGH (ref 98–111)
Creatinine, Ser: 1.57 mg/dL — ABNORMAL HIGH (ref 0.61–1.24)
GFR, Estimated: 45 mL/min — ABNORMAL LOW (ref >60)
Glucose, Bld: 125 mg/dL — ABNORMAL HIGH (ref 70–99)
Potassium: 4.4 mmol/L (ref 3.5–5.1)
Sodium: 143 mmol/L (ref 135–145)
Total Bilirubin: 0.5 mg/dL (ref 0.3–1.2)
Total Protein: 6.8 g/dL (ref 6.5–8.1)

## 2022-08-06 LAB — TROPONIN I (HIGH SENSITIVITY)
Troponin I (High Sensitivity): 57 ng/L — ABNORMAL HIGH (ref <18)
Troponin I (High Sensitivity): 50 ng/L — ABNORMAL HIGH (ref <18)

## 2022-08-06 LAB — LACTIC ACID, PLASMA
Lactic Acid, Venous: 1.4 mmol/L (ref 0.5–1.9)
Lactic Acid, Venous: 1.2 mmol/L (ref 0.5–1.9)

## 2022-08-06 LAB — RESP PANEL BY RT-PCR (RSV, FLU A&B, COVID)  RVPGX2
Influenza A by PCR: NEGATIVE
Influenza B by PCR: NEGATIVE
Resp Syncytial Virus by PCR: NEGATIVE
SARS Coronavirus 2 by RT PCR: NEGATIVE

## 2022-08-06 LAB — AMMONIA: Ammonia: <10 umol/L (ref 9–35)

## 2022-08-06 MED ORDER — ACETAMINOPHEN 325 MG PO TABS
650.0000 mg | ORAL_TABLET | Freq: Four times a day (QID) | ORAL | Status: DC | PRN
  Administered 2022-08-08 (×2): 650 mg via ORAL
  Filled 2022-08-06 (×2): qty 2

## 2022-08-06 MED ORDER — MAGNESIUM HYDROXIDE 400 MG/5ML PO SUSP: 30.0000 mL | Freq: Every day | ORAL | Status: DC | PRN

## 2022-08-06 MED ORDER — TRAZODONE HCL 50 MG PO TABS: 25.0000 mg | ORAL_TABLET | Freq: Every evening | ORAL | Status: DC | PRN

## 2022-08-06 MED ORDER — ENOXAPARIN SODIUM 40 MG/0.4ML IJ SOSY
40.0000 mg | PREFILLED_SYRINGE | INTRAMUSCULAR | Status: DC
  Administered 2022-08-07 – 2022-08-08 (×2): 40 mg via SUBCUTANEOUS
  Filled 2022-08-06 (×2): qty 0.4

## 2022-08-06 MED ORDER — ONDANSETRON HCL 4 MG PO TABS: 4.0000 mg | ORAL_TABLET | Freq: Four times a day (QID) | ORAL | Status: DC | PRN

## 2022-08-06 MED ORDER — ADULT MULTIVITAMIN W/MINERALS CH: 1.0000 | ORAL_TABLET | Freq: Every day | ORAL | Status: DC

## 2022-08-06 MED ORDER — ONDANSETRON HCL 4 MG/2ML IJ SOLN: 4.0000 mg | Freq: Four times a day (QID) | INTRAMUSCULAR | Status: DC | PRN

## 2022-08-06 MED ORDER — IOHEXOL 350 MG/ML SOLN
80.0000 mL | Freq: Once | INTRAVENOUS | Status: AC | PRN
  Administered 2022-08-06: 80 mL via INTRAVENOUS

## 2022-08-06 MED ORDER — SODIUM CHLORIDE 0.9 % IV SOLN: INTRAVENOUS | Status: DC

## 2022-08-06 MED ORDER — ACETAMINOPHEN 650 MG RE SUPP: 650.0000 mg | Freq: Four times a day (QID) | RECTAL | Status: DC | PRN

## 2022-08-06 MED ORDER — SERTRALINE HCL 50 MG PO TABS
25.0000 mg | ORAL_TABLET | Freq: Every day | ORAL | Status: DC
  Administered 2022-08-08 – 2022-08-09 (×2): 25 mg via ORAL
  Filled 2022-08-06 (×2): qty 1

## 2022-08-06 MED ORDER — SODIUM CHLORIDE 0.9 % IV BOLUS (SEPSIS)
1000.0000 mL | Freq: Once | INTRAVENOUS | Status: AC
  Administered 2022-08-06: 1000 mL via INTRAVENOUS

## 2022-08-06 MED ORDER — ENSURE ENLIVE PO LIQD
237.0000 mL | Freq: Three times a day (TID) | ORAL | Status: DC
  Administered 2022-08-07 – 2022-08-09 (×6): 237 mL via ORAL

## 2022-08-06 MED ORDER — SODIUM CHLORIDE 0.9 % IV BOLUS
1000.0000 mL | Freq: Once | INTRAVENOUS | Status: AC
  Administered 2022-08-06: 1000 mL via INTRAVENOUS

## 2022-08-06 MED ORDER — VITAMIN C 500 MG PO TABS: 500.0000 mg | ORAL_TABLET | Freq: Two times a day (BID) | ORAL | Status: DC

## 2022-08-06 MED ORDER — ZINC SULFATE 220 (50 ZN) MG PO CAPS: 220.0000 mg | ORAL_CAPSULE | Freq: Every day | ORAL | Status: DC

## 2022-08-06 NOTE — H&P (Signed)
[place]   PATIENT NAME: Carl Figueroa    MR#:  [id number]  DATE OF BIRTH:  Mar 04, 1946  DATE OF ADMISSION:  08/06/2022  PRIMARY CARE PHYSICIAN: Mick SellFitzgerald, David P, MD   Patient is coming from: Home  REQUESTING/REFERRING PHYSICIAN: Pilar JarvisWong, Silas, MD  CHIEF COMPLAINT:   Chief Complaint  Patient presents with   Altered Mental Status    HISTORY OF PRESENT ILLNESS:  Carl Figueroa is a 76 y.o. male with medical history significant for osteoarthritis, anxiety, hypertension and Lewy body dementia, presented to the emergency room with acute onset of altered mental status with confusion over the last couple of days with diminished p.o. intake.  No fever or chills.  No nausea or vomiting or abdominal pain.  No chest pain or dyspnea or palpitations.  No cough or wheezing.  No paresthesias or focal muscle weakness.  He was admitted here on 12 2 and discharged on 12 8 after being managed for VRE bacteremia and intra-abdominal abscess for which she had a drain as well as AKI.  He did not report any dysuria, oliguria or hematuria or flank pain.  He is overall a poor historian due to his Lewy body dementia.  ED Course: When he came to the ER, vital signs were within normal and later respiratory rate was 25.  Labs revealed a chloride of 113 and glucose 125, BUN 35 creatinine 1.57 above previous levels that were normal.  High sensitive troponin was 57 and later 50.  It has been elevated last month.  Lactic acid was 1.4 and later 1.2 CBC showed WBC of 10.6 with hemoglobin of 8.7 hematocrit 28.9 better than previous values.  Platelets were 110.  UA showed specific gravity 1035 and protein of 30 with large leukocytes.  Blood cultures were drawn. EKG as reviewed by me : Normal sinus rhythm with a rate of 81 with early repolarization. Imaging: Abdominal pelvic CT scan as well as chest CTA revealed revealed the following: 1. No pulmonary embolus. 2. No acute intrathoracic abnormality. 3. No  acute intra-abdominal or intrapelvic abnormality in a patient with colectomy and right lower quadrant end ostomy. Limited evaluation due to lack of intraperitoneal fat, decompressed bowel, and streak artifact from left femoral surgical hardware. 4. Right lower abdominal surgical drain with pigtail within the right mid abdomen just inferior to the liver and gallbladder.  The patient was given 1 L bolus of IV normal saline.  He will be admitted to a medical telemetry observation bed for further evaluation and management. 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   Procedure: TOTAL HIP ARTHROPLASTY ANTERIOR APPROACH;  Surgeon: Kennedy BuckerMenz, Michael, MD;  Location: [facility] ORS;  Service: Orthopedics;  Laterality: Left;    SOCIAL HISTORY:   Social History   Tobacco Use   Smoking status: Former    Packs/day: 0.25    Years: 20.00    Total pack years: 5.00    Types: Cigarettes    Quit date: 10/28/2019  Years since quitting: 2.7   Smokeless tobacco: Never  Substance Use Topics   Alcohol use: No    FAMILY HISTORY:   Family History  Problem Relation Age of Onset   Cancer Mother    Diabetes Mellitus II Neg Hx     DRUG ALLERGIES:   Allergies  Allergen Reactions   Morphine Nausea Only    REVIEW OF SYSTEMS:   ROS As per history of present illness. All pertinent systems were reviewed above. Constitutional, HEENT, cardiovascular, respiratory, GI, GU, musculoskeletal, neuro, psychiatric, endocrine, integumentary and hematologic systems were reviewed and are otherwise negative/unremarkable except for positive findings mentioned above in  the HPI.   MEDICATIONS AT HOME:   Prior to Admission medications   Medication Sig Start Date End Date Taking? Authorizing Provider  ascorbic acid (VITAMIN C) 500 MG tablet Take 1 tablet (500 mg total) by mouth 2 (two) times daily. 07/26/22   Alford Highland, MD  feeding supplement (ENSURE ENLIVE / ENSURE PLUS) LIQD Take 237 mLs by mouth 3 (three) times daily between meals. 07/26/22   Alford Highland, MD  furosemide (LASIX) 40 MG tablet Take 1 tablet (40 mg total) by mouth daily as needed. As needed for leg edema 05/09/22 06/09/23  Furth, Cadence H, PA-C  Multiple Vitamin (MULTIVITAMIN WITH MINERALS) TABS tablet Take 1 tablet by mouth daily. 07/27/22   Alford Highland, MD  sertraline (ZOLOFT) 25 MG tablet Take 25 mg by mouth daily.    [provider]  sodium chloride irrigation 0.9 % irrigation Irrigate with 5 mLs as directed in the morning, at noon, and at bedtime. 07/27/22   Alford Highland, MD  zinc sulfate 220 (50 Zn) MG capsule Take 1 capsule (220 mg total) by mouth daily. 07/27/22   Alford Highland, MD      VITAL SIGNS:  Blood pressure (!) 100/59, pulse 77, temperature 97.8 F (36.6 C), temperature source Axillary, resp. rate 14, weight 58.1 kg, SpO2 95 %.  PHYSICAL EXAMINATION:  Physical Exam  GENERAL:  76 y.o.-year-old African-American male patient lying in the bed with no acute distress.  EYES: Pupils equal, round, reactive to light and accommodation. No scleral icterus. Extraocular muscles intact.  HEENT: Head atraumatic, normocephalic. Oropharynx with slightly dry mucous membrane and tongue and nasopharynx clear.  NECK:  Supple, no jugular venous distention. No thyroid enlargement, no tenderness.  LUNGS: Normal breath sounds bilaterally, no wheezing, rales,rhonchi or crepitation. No use of accessory muscles of respiration.  CARDIOVASCULAR: Regular rate and rhythm, S1, S2 normal. No murmurs, rubs, or gallops.  ABDOMEN: Soft, nondistended, nontender. Bowel sounds  present. No organomegaly or mass.  EXTREMITIES: No pedal edema, cyanosis, or clubbing.  NEUROLOGIC: Cranial nerves II through XII are intact. Muscle strength 5/5 in all extremities. Sensation intact. Gait not checked.  PSYCHIATRIC: The patient is alert and oriented only to himself.  Normal affect and good eye contact. SKIN: No obvious rash, lesion, or ulcer.   LABORATORY PANEL:   CBC Recent Labs  Lab 08/06/22 1909  WBC 10.6*  HGB 8.7*  HCT 28.9*  PLT 110*   ------------------------------------------------------------------------------------------------------------------  Chemistries  Recent Labs  Lab 08/06/22 1909  NA 143  K 4.4  CL 113*  CO2 25  GLUCOSE 125*  BUN 35*  CREATININE 1.57*  CALCIUM 9.7  AST 17  ALT 16  ALKPHOS 67  BILITOT 0.5   ------------------------------------------------------------------------------------------------------------------  Cardiac Enzymes No results for input(s): "TROPONINI" in the last 168 hours. ------------------------------------------------------------------------------------------------------------------  RADIOLOGY:  CT Angio Chest PE W/Cm &/Or Wo  Cm  Result Date: 08/06/2022 CLINICAL DATA:  Sepsis Abdominal pain, acute, nonlocalized; Pulmonary embolism (PE) suspected, high prob EXAM: CT ANGIOGRAPHY CHEST CT ABDOMEN AND PELVIS WITH CONTRAST TECHNIQUE: Multidetector CT imaging of the chest was performed using the standard protocol during bolus administration of intravenous contrast. Multiplanar CT image reconstructions and MIPs were obtained to evaluate the vascular anatomy. Multidetector CT imaging of the abdomen and pelvis was performed using the standard protocol during bolus administration of intravenous contrast. RADIATION DOSE REDUCTION: This exam was performed according to the departmental dose-optimization program which includes automated exposure control, adjustment of the mA and/or kV according to patient size and/or use of  iterative reconstruction technique. CONTRAST:  69mL OMNIPAQUE IOHEXOL 350 MG/ML SOLN COMPARISON:  None Available. FINDINGS: CTA CHEST FINDINGS Cardiovascular: Satisfactory opacification of the pulmonary arteries to the segmental level. No evidence of pulmonary embolism. Normal heart size. No significant pericardial effusion. The thoracic aorta is normal in caliber. Mild atherosclerotic plaque of the thoracic aorta. Left anterior descending coronary artery calcifications. Mediastinum/Nodes: No enlarged mediastinal, hilar, or axillary lymph nodes. Thyroid gland, trachea, and esophagus demonstrate no significant findings. Lungs/Pleura: Elevated left hemidiaphragm. Left lower lobe linear atelectasis versus scarring. No focal consolidation. No pulmonary nodule. No pulmonary mass. No pleural effusion. No pneumothorax. Musculoskeletal: Bilateral trace gynecomastia.  No chest wall abnormality. No suspicious lytic or blastic osseous lesions. No acute displaced fracture. Multilevel degenerative changes of the spine. Review of the MIP images confirms the above findings. CT ABDOMEN and PELVIS FINDINGS Hepatobiliary: No focal liver abnormality. No gallstones, gallbladder wall thickening, or pericholecystic fluid. No biliary dilatation. Pancreas: Diffusely atrophic. No focal lesion. Otherwise normal pancreatic contour. No surrounding inflammatory changes. No main pancreatic ductal dilatation. Spleen: Normal in size without focal abnormality. Adrenals/Urinary Tract: No adrenal nodule bilaterally. Bilateral kidneys enhance symmetrically. Fluid density lesion within the left kidney likely represents a simple renal cyst. Simple renal cysts, in the absence of clinically indicated signs/symptoms, require no independent follow-up. No hydronephrosis. No hydroureter. The urinary bladder is not well visualized due to streak artifact originating from the left femoral surgical hardware. Foley catheter noted within the urinary bladder lumen  with complete decompression of the urinary bladder. 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 Right lower quadrant ostomy formation. Majority of the bowel is decompressed. Stomach is within normal limits. No evidence of bowel wall thickening or dilatation. Limited evaluation of the active rectum due to streak artifact originating from the left femoral surgical hardware. Appendix appears normal. Vascular/Lymphatic: No abdominal aorta or iliac aneurysm. Mild atherosclerotic plaque of the aorta and its branches. No abdominal, pelvic, or inguinal lymphadenopathy. Reproductive: Not visualized due to streak artifact from the left femoral surgical hardware. Other: Right lower abdominal surgical drain with pigtail within the right mid abdomen just inferior to the liver and gallbladder. No intraperitoneal free fluid. No intraperitoneal free gas. No organized fluid collection. Musculoskeletal: No abdominal wall hernia or abnormality. No suspicious lytic or blastic osseous lesions. No acute displaced fracture. Grade 1 anterolisthesis of L4 on L5 and L5 on S1. Multilevel severe degenerative changes of the spine. Total left hip arthroplasty. At least moderate degenerative changes of the right hip. Review of the MIP images confirms the above findings. IMPRESSION: 1. No pulmonary embolus. 2. No acute intrathoracic abnormality. 3. No acute intra-abdominal or intrapelvic abnormality in a patient with colectomy and right lower quadrant end ostomy. Limited evaluation due to lack of intraperitoneal fat, decompressed bowel, and streak artifact from left femoral surgical hardware. 4. Right  lower abdominal surgical drain with pigtail within the right mid abdomen just inferior to the liver and gallbladder. Electronically Signed   By: Tish Frederickson M.D.   On: 08/06/2022 21:03   CT Abdomen Pelvis W Contrast  Result Date: 08/06/2022 CLINICAL DATA:  Sepsis Abdominal pain, acute, nonlocalized; Pulmonary embolism  (PE) suspected, high prob EXAM: CT ANGIOGRAPHY CHEST CT ABDOMEN AND PELVIS WITH CONTRAST TECHNIQUE: Multidetector CT imaging of the chest was performed using the standard protocol during bolus administration of intravenous contrast. Multiplanar CT image reconstructions and MIPs were obtained to evaluate the vascular anatomy. Multidetector CT imaging of the abdomen and pelvis was performed using the standard protocol during bolus administration of intravenous contrast. RADIATION DOSE REDUCTION: This exam was performed according to the departmental dose-optimization program which includes automated exposure control, adjustment of the mA and/or kV according to patient size and/or use of iterative reconstruction technique. CONTRAST:  13mL OMNIPAQUE IOHEXOL 350 MG/ML SOLN COMPARISON:  None Available. FINDINGS: CTA CHEST FINDINGS Cardiovascular: Satisfactory opacification of the pulmonary arteries to the segmental level. No evidence of pulmonary embolism. Normal heart size. No significant pericardial effusion. The thoracic aorta is normal in caliber. Mild atherosclerotic plaque of the thoracic aorta. Left anterior descending coronary artery calcifications. Mediastinum/Nodes: No enlarged mediastinal, hilar, or axillary lymph nodes. Thyroid gland, trachea, and esophagus demonstrate no significant findings. Lungs/Pleura: Elevated left hemidiaphragm. Left lower lobe linear atelectasis versus scarring. No focal consolidation. No pulmonary nodule. No pulmonary mass. No pleural effusion. No pneumothorax. Musculoskeletal: Bilateral trace gynecomastia.  No chest wall abnormality. No suspicious lytic or blastic osseous lesions. No acute displaced fracture. Multilevel degenerative changes of the spine. Review of the MIP images confirms the above findings. CT ABDOMEN and PELVIS FINDINGS Hepatobiliary: No focal liver abnormality. No gallstones, gallbladder wall thickening, or pericholecystic fluid. No biliary dilatation. Pancreas:  Diffusely atrophic. No focal lesion. Otherwise normal pancreatic contour. No surrounding inflammatory changes. No main pancreatic ductal dilatation. Spleen: Normal in size without focal abnormality. Adrenals/Urinary Tract: No adrenal nodule bilaterally. Bilateral kidneys enhance symmetrically. Fluid density lesion within the left kidney likely represents a simple renal cyst. Simple renal cysts, in the absence of clinically indicated signs/symptoms, require no independent follow-up. No hydronephrosis. No hydroureter. The urinary bladder is not well visualized due to streak artifact originating from the left femoral surgical hardware. Foley catheter noted within the urinary bladder lumen with complete decompression of the urinary bladder. 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 Right lower quadrant ostomy formation. Majority of the bowel is decompressed. Stomach is within normal limits. No evidence of bowel wall thickening or dilatation. Limited evaluation of the active rectum due to streak artifact originating from the left femoral surgical hardware. Appendix appears normal. Vascular/Lymphatic: No abdominal aorta or iliac aneurysm. Mild atherosclerotic plaque of the aorta and its branches. No abdominal, pelvic, or inguinal lymphadenopathy. Reproductive: Not visualized due to streak artifact from the left femoral surgical hardware. Other: Right lower abdominal surgical drain with pigtail within the right mid abdomen just inferior to the liver and gallbladder. No intraperitoneal free fluid. No intraperitoneal free gas. No organized fluid collection. Musculoskeletal: No abdominal wall hernia or abnormality. No suspicious lytic or blastic osseous lesions. No acute displaced fracture. Grade 1 anterolisthesis of L4 on L5 and L5 on S1. Multilevel severe degenerative changes of the spine. Total left hip arthroplasty. At least moderate degenerative changes of the right hip. Review of the MIP images  confirms the above findings. IMPRESSION: 1. No pulmonary embolus. 2. No acute intrathoracic  abnormality. 3. No acute intra-abdominal or intrapelvic abnormality in a patient with colectomy and right lower quadrant end ostomy. Limited evaluation due to lack of intraperitoneal fat, decompressed bowel, and streak artifact from left femoral surgical hardware. 4. Right lower abdominal surgical drain with pigtail within the right mid abdomen just inferior to the liver and gallbladder. Electronically Signed   By: Tish Frederickson M.D.   On: 08/06/2022 21:03   CT Head Wo Contrast  Result Date: 08/06/2022 CLINICAL DATA:  Mental status change, unknown cause EXAM: CT HEAD WITHOUT CONTRAST TECHNIQUE: Contiguous axial images were obtained from the base of the skull through the vertex without intravenous contrast. RADIATION DOSE REDUCTION: This exam was performed according to the departmental dose-optimization program which includes automated exposure control, adjustment of the mA and/or kV according to patient size and/or use of iterative reconstruction technique. COMPARISON:  CT head 01/11/2022 FINDINGS: Brain: Cerebral ventricle sizes are concordant with the degree of cerebral volume loss. Patchy and confluent areas of decreased attenuation are noted throughout the deep and periventricular white matter of the cerebral hemispheres bilaterally, compatible with chronic microvascular ischemic disease. No evidence of large-territorial acute infarction. No parenchymal hemorrhage. No mass lesion. No extra-axial collection. No mass effect or midline shift. No hydrocephalus. Basilar cisterns are patent. Vascular: No hyperdense vessel. Skull: No acute fracture or focal lesion. Sinuses/Orbits: Bilateral maxillary sinus mucosal thickening. Otherwise paranasal sinuses and mastoid air cells are clear. Right lens replacement. Otherwise the orbits are unremarkable. Other: None. IMPRESSION: No acute intracranial abnormality. Electronically  Signed   By: Tish Frederickson M.D.   On: 08/06/2022 20:52   DG Chest Port 1 View  Result Date: 08/06/2022 CLINICAL DATA:  Shortness of breath EXAM: PORTABLE CHEST 1 VIEW COMPARISON:  07/21/2022 FINDINGS: Scarring or atelectasis left base. No acute airspace disease. Stable normal cardiomediastinal silhouette with aortic atherosclerosis. No pneumothorax IMPRESSION: No active disease. Scarring or atelectasis at the left base. Electronically Signed   By: Jasmine Pang M.D.   On: 08/06/2022 19:23      IMPRESSION AND PLAN:  Assessment and Plan: * AKI (acute kidney injury) (HCC) - The patient will be admitted to a medical telemetry observation bed. - His AKI is likely prerenal due to volume depletion and dehydration with diminished p.o. intake. - This could be the main culprit for his acute metabolic encephalopathy/altered mental status - We will hydrate with IV normal saline and follow BMP. - We will avoid nephrotoxins.  Dementia without behavioral disturbance (HCC) - We will continue Aricept and Exelon patch.  Dyslipidemia - Continue statin therapy.  Anxiety and depression - We will continue his Klonopin as well as Effexor XR and Zoloft.  Essential hypertension - We will hold off his lisinopril given acute kidney injury.       DVT prophylaxis: Lovenox.  Advanced Care Planning:  Code Status: full code.  Family Communication:  The plan of care was discussed in details with the patient (and family). I answered all questions. The patient agreed to proceed with the above mentioned plan. Further management will depend upon hospital course. Disposition Plan: Back to previous home environment Consults called: none.  All the records are reviewed and case discussed with ED provider.  Status is: Observation  I certify that at the time of admission, it is my clinical judgment that the patient will requirehospital care extending less than 2 midnights.  Dispo: The  patient is from: Home              Anticipated d/c is to: Home              Patient currently is not medically stable to d/c.              Difficult to place patient: No  Hannah Beat M.D on 08/07/2022 at 4:02 AM  [facility]   From 7 PM-7 AM, contact night-coverage [email or web address]  CC: Primary care physician; Mick Sell, MD

## 2022-08-06 NOTE — ED Provider Triage Note (Signed)
Emergency Medicine Provider Triage Evaluation Note  Carl Figueroa , a 76 y.o. male  was evaluated in triage.  Pt complains of AMS and hypoxia. Worsening AMS today, new oxygen demand. Patient is pleasant but largely non-contributory to history.  Review of Systems  Positive: AMS, hypoxia Negative:   Physical Exam  BP (!) 103/54   Pulse 82   Temp 98.1 F (36.7 C) (Oral)   Resp 18  Gen:   Awake, no distress   Resp:  Normal effort  MSK:   Moves extremities without difficulty  Other:    Medical Decision Making  Medically screening exam initiated at 6:26 PM.  Appropriate orders placed.  Carl Figueroa was informed that the remainder of the evaluation will be completed by another provider, this initial triage assessment does not replace that evaluation, and the importance of remaining in the ED until their evaluation is complete.  Labs, EKG, chest xray, urinalysis   Racheal Patches, PA-C 08/06/22 1827

## 2022-08-06 NOTE — ED Notes (Signed)
Report received, This RN now assuming care. 

## 2022-08-06 NOTE — ED Triage Notes (Signed)
First nurse note: Pt here [facility] with c/o of AMS, pt has HX of dementia. Pt from home.   O2 sat was 60% on RA, pt placed on 4L/min via [place], sat now in the 90's.   CBG 80's  125 mg of solumedrol given, 20G L AC

## 2022-08-06 NOTE — ED Provider Notes (Signed)
[facility] Provider Note    Event Date/Time   First MD Initiated Contact with Patient 08/06/22 1914     (approximate)   History   Altered Mental Status   HPI  Carl Figueroa is a 76 y.o. male   Past medical history of Lewy body dementia, anxiety, arthritis, hypertension and a recent hospitalization which addressed AKI, intra-abdominal fluid collection with drainage placed, VRE bacteremia, was discharged last week.  He had finished his course of antibiotics.  He was doing well at home with a healthy appetite, bedbound status, pressure ulcer to the upper buttocks causing some chronic pain but became altered and more confused over the last 2 days.  Slightly agitated and having poor sleep.  No fevers or chills, decreased p.o. intake noted by wife.  He has indwelling Foley catheter.  He has had a mild dry cough.  He denies pain other than the chronic pain on his buttock ulcer.  His abdominal drain has been putting out blood at first but then transition to some solid/yellow output.  History was obtained via the patient, though minimal due to his dementia.  His wife is at bedside as independent historian who offers most of the information given as above.  I reviewed discharge summary dated 07/26/2022 when he was admitted for VRE bacteremia and intra-abdominal abscess.      Physical Exam   Triage Vital Signs: ED Triage Vitals [08/06/22 1822]  Enc Vitals Group     BP (!) 103/54     Pulse Rate 82     Resp 18     Temp 98.1 F (36.7 C)     Temp Source Oral     SpO2      Weight      Height      Head Circumference      Peak Flow      Pain Score      Pain Loc      Pain Edu?      Excl. in [place]?     Most recent vital signs: Vitals:   08/06/22 2100 08/06/22 2249  BP: 101/82 (!) 103/58  Pulse: 81 81  Resp: 18 16  Temp: 98.2 F (36.8 C)   SpO2: 98% 100%    General: Awake, no distress.  CV:   Mucous membranes dry he appears slightly  dehydrated. Resp:  Normal effort.  Unable to get a clear read on his O2 saturations but reportedly was low so he was placed on 4 L nasal cannula.  He has no respiratory distress and his lungs are clear to auscultation without wheezing rales or focalities. Abd:  No distention.  Drains in place, colostomy bag in place, sites appear clean dry and intact, abdomen is nontender to palpation.  There is output green/yellow substance from the abdominal drain.  Foley is in place with dark yellow urine. Other:  Afebrile, comfortable appearing.  Conversant and pleasant.   ED Results / Procedures / Treatments   Labs (all labs ordered are listed, but only abnormal results are displayed) Labs Reviewed  COMPREHENSIVE METABOLIC PANEL - Abnormal; Notable for the following components:      Result Value   Chloride 113 (*)    Glucose, Bld 125 (*)    BUN 35 (*)    Creatinine, Ser 1.57 (*)    Albumin 2.7 (*)    GFR, Estimated 45 (*)    All other components within normal limits  CBC WITH DIFFERENTIAL/PLATELET - Abnormal; Notable for  the following components:   WBC 10.6 (*)    RBC 3.12 (*)    Hemoglobin 8.7 (*)    HCT 28.9 (*)    Platelets 110 (*)    Neutro Abs 10.0 (*)    Lymphs Abs 0.2 (*)    Abs Immature Granulocytes 0.11 (*)    All other components within normal limits  URINALYSIS, COMPLETE (UACMP) WITH MICROSCOPIC - Abnormal; Notable for the following components:   Color, Urine YELLOW (*)    APPearance CLOUDY (*)    Specific Gravity, Urine 1.035 (*)    Protein, ur 30 (*)    Leukocytes,Ua LARGE (*)    All other components within normal limits  TROPONIN I (HIGH SENSITIVITY) - Abnormal; Notable for the following components:   Troponin I (High Sensitivity) 57 (*)    All other components within normal limits  TROPONIN I (HIGH SENSITIVITY) - Abnormal; Notable for the following components:   Troponin I (High Sensitivity) 50 (*)    All other components within normal limits  RESP PANEL BY RT-PCR (RSV,  FLU A&B, COVID)  RVPGX2  CULTURE, BLOOD (ROUTINE X 2)  CULTURE, BLOOD (ROUTINE X 2)  AMMONIA  LACTIC ACID, PLASMA  LACTIC ACID, PLASMA  BASIC METABOLIC PANEL  CBC     I reviewed labs and they are notable for white blood cell count is 10.6.  Hemoglobin is 8.7 at baseline.  Is a troponin of 57 which is consistent with prior elevated troponins while inpatient.  EKG  ED ECG REPORT I, Lucillie Garfinkel, the attending physician, personally viewed and interpreted this ECG.   Date: 08/06/2022  EKG Time: 1943  Rate: 81  Rhythm: normal sinus rhythm  Axis: nl  Intervals:none  ST&T Change: no ischemic changes acutely.     RADIOLOGY I independently reviewed and interpreted CT of the head and see no obvious bleeding or midline shift   PROCEDURES:  Critical Care performed: No  Procedures   MEDICATIONS ORDERED IN ED: Medications  sertraline (ZOLOFT) tablet 25 mg (has no administration in time range)  feeding supplement (ENSURE ENLIVE / ENSURE PLUS) liquid 237 mL (has no administration in time range)  enoxaparin (LOVENOX) injection 40 mg (has no administration in time range)  0.9 %  sodium chloride infusion (has no administration in time range)  acetaminophen (TYLENOL) tablet 650 mg (has no administration in time range)    Or  acetaminophen (TYLENOL) suppository 650 mg (has no administration in time range)  traZODone (DESYREL) tablet 25 mg (has no administration in time range)  magnesium hydroxide (MILK OF MAGNESIA) suspension 30 mL (has no administration in time range)  ondansetron (ZOFRAN) tablet 4 mg (has no administration in time range)    Or  ondansetron (ZOFRAN) injection 4 mg (has no administration in time range)  sodium chloride 0.9 % bolus 1,000 mL (0 mLs Intravenous Stopped 08/06/22 2043)  iohexol (OMNIPAQUE) 350 MG/ML injection 80 mL (80 mLs Intravenous Contrast Given 08/06/22 2023)  sodium chloride 0.9 % bolus 1,000 mL (1,000 mLs Intravenous New Bag/Given 08/06/22 2220)     Consultants:  I spoke with hospitalist for admission & regarding care plan for this patient.   IMPRESSION / MDM / ASSESSMENT AND PLAN / ED COURSE  I reviewed the triage vital signs and the nursing notes.                              Differential diagnosis includes, but is not limited to,  infection, metabolic derangement, intracranial bleeding, AKI, ACS   The patient is on the cardiac monitor to evaluate for evidence of arrhythmia and/or significant heart rate changes.  MDM: Patient with multiple comorbidities and recent hospitalization with abdominal abscess with drain placed, metabolic infectious workup initiated with no obvious source of infection thus far.  He was found to have AKI.  He was given 2 L of IV crystalloid.  His CT scans showed no acute emergent pathologies.  He was admitted to the hospital for further workup and evaluation for AMS.  He is alert, pleasant, no nuchal rigidity, nontoxic appearance and afebrile so I doubt meningitis.   Patient's presentation is most consistent with acute presentation with potential threat to life or bodily function.       FINAL CLINICAL IMPRESSION(S) / ED DIAGNOSES   Final diagnoses:  Altered mental status, unspecified altered mental status type  AKI (acute kidney injury) (HCC)     Rx / DC Orders   ED Discharge Orders     None        Note:  This document was prepared using Dragon voice recognition software and may include unintentional dictation errors.    Pilar Jarvis, MD 08/06/22 [phone]

## 2022-08-06 NOTE — ED Triage Notes (Signed)
Pt presents to the ED via EMS due to increased AMS and low O2. Pt has hx of demenita. Pt is currently confused.

## 2022-08-07 DIAGNOSIS — R4182 Altered mental status, unspecified: Secondary | ICD-10-CM

## 2022-08-07 DIAGNOSIS — G8929 Other chronic pain: Secondary | ICD-10-CM | POA: Diagnosis present

## 2022-08-07 DIAGNOSIS — Z809 Family history of malignant neoplasm, unspecified: Secondary | ICD-10-CM | POA: Diagnosis not present

## 2022-08-07 DIAGNOSIS — E860 Dehydration: Secondary | ICD-10-CM | POA: Diagnosis present

## 2022-08-07 DIAGNOSIS — N179 Acute kidney failure, unspecified: Secondary | ICD-10-CM | POA: Diagnosis not present

## 2022-08-07 DIAGNOSIS — Z96642 Presence of left artificial hip joint: Secondary | ICD-10-CM | POA: Diagnosis present

## 2022-08-07 DIAGNOSIS — Z79899 Other long term (current) drug therapy: Secondary | ICD-10-CM | POA: Diagnosis not present

## 2022-08-07 DIAGNOSIS — F0283 Dementia in other diseases classified elsewhere, unspecified severity, with mood disturbance: Secondary | ICD-10-CM | POA: Diagnosis present

## 2022-08-07 DIAGNOSIS — F0390 Unspecified dementia without behavioral disturbance: Secondary | ICD-10-CM | POA: Diagnosis not present

## 2022-08-07 DIAGNOSIS — Z9049 Acquired absence of other specified parts of digestive tract: Secondary | ICD-10-CM | POA: Diagnosis not present

## 2022-08-07 DIAGNOSIS — Z87891 Personal history of nicotine dependence: Secondary | ICD-10-CM | POA: Diagnosis not present

## 2022-08-07 DIAGNOSIS — E875 Hyperkalemia: Secondary | ICD-10-CM | POA: Diagnosis present

## 2022-08-07 DIAGNOSIS — Z932 Ileostomy status: Secondary | ICD-10-CM | POA: Diagnosis not present

## 2022-08-07 DIAGNOSIS — I10 Essential (primary) hypertension: Secondary | ICD-10-CM | POA: Diagnosis present

## 2022-08-07 DIAGNOSIS — E785 Hyperlipidemia, unspecified: Secondary | ICD-10-CM | POA: Diagnosis present

## 2022-08-07 DIAGNOSIS — L89302 Pressure ulcer of unspecified buttock, stage 2: Secondary | ICD-10-CM | POA: Diagnosis present

## 2022-08-07 DIAGNOSIS — R339 Retention of urine, unspecified: Secondary | ICD-10-CM | POA: Diagnosis present

## 2022-08-07 DIAGNOSIS — Z1152 Encounter for screening for COVID-19: Secondary | ICD-10-CM | POA: Diagnosis not present

## 2022-08-07 DIAGNOSIS — F419 Anxiety disorder, unspecified: Secondary | ICD-10-CM | POA: Diagnosis not present

## 2022-08-07 DIAGNOSIS — D638 Anemia in other chronic diseases classified elsewhere: Secondary | ICD-10-CM | POA: Diagnosis present

## 2022-08-07 DIAGNOSIS — R338 Other retention of urine: Secondary | ICD-10-CM | POA: Diagnosis present

## 2022-08-07 DIAGNOSIS — G9341 Metabolic encephalopathy: Secondary | ICD-10-CM | POA: Diagnosis present

## 2022-08-07 DIAGNOSIS — Z885 Allergy status to narcotic agent status: Secondary | ICD-10-CM | POA: Diagnosis not present

## 2022-08-07 DIAGNOSIS — G3183 Dementia with Lewy bodies: Secondary | ICD-10-CM | POA: Diagnosis present

## 2022-08-07 DIAGNOSIS — Z8674 Personal history of sudden cardiac arrest: Secondary | ICD-10-CM | POA: Diagnosis not present

## 2022-08-07 DIAGNOSIS — N401 Enlarged prostate with lower urinary tract symptoms: Secondary | ICD-10-CM | POA: Diagnosis present

## 2022-08-07 DIAGNOSIS — F0284 Dementia in other diseases classified elsewhere, unspecified severity, with anxiety: Secondary | ICD-10-CM | POA: Diagnosis present

## 2022-08-07 LAB — BASIC METABOLIC PANEL
Anion gap: 3 — ABNORMAL LOW (ref 5–15)
Anion gap: 5 (ref 5–15)
BUN: 37 mg/dL — ABNORMAL HIGH (ref 8–23)
BUN: 38 mg/dL — ABNORMAL HIGH (ref 8–23)
CO2: 21 mmol/L — ABNORMAL LOW (ref 22–32)
CO2: 24 mmol/L (ref 22–32)
Calcium: 9.7 mg/dL (ref 8.9–10.3)
Calcium: 9.8 mg/dL (ref 8.9–10.3)
Chloride: 121 mmol/L — ABNORMAL HIGH (ref 98–111)
Chloride: 117 mmol/L — ABNORMAL HIGH (ref 98–111)
Creatinine, Ser: 1.31 mg/dL — ABNORMAL HIGH (ref 0.61–1.24)
Creatinine, Ser: 1.44 mg/dL — ABNORMAL HIGH (ref 0.61–1.24)
GFR, Estimated: 56 mL/min — ABNORMAL LOW (ref >60)
GFR, Estimated: 50 mL/min — ABNORMAL LOW (ref >60)
Glucose, Bld: 128 mg/dL — ABNORMAL HIGH (ref 70–99)
Glucose, Bld: 121 mg/dL — ABNORMAL HIGH (ref 70–99)
Potassium: 5.4 mmol/L — ABNORMAL HIGH (ref 3.5–5.1)
Potassium: 4.9 mmol/L (ref 3.5–5.1)
Sodium: 145 mmol/L (ref 135–145)
Sodium: 146 mmol/L — ABNORMAL HIGH (ref 135–145)

## 2022-08-07 LAB — CBC
HCT: 30.1 % — ABNORMAL LOW (ref 39.0–52.0)
Hemoglobin: 9.1 g/dL — ABNORMAL LOW (ref 13.0–17.0)
MCH: 28.1 pg (ref 26.0–34.0)
MCHC: 30.2 g/dL (ref 30.0–36.0)
MCV: 92.9 fL (ref 80.0–100.0)
Platelets: 110 10*3/uL — ABNORMAL LOW (ref 150–400)
RBC: 3.24 MIL/uL — ABNORMAL LOW (ref 4.22–5.81)
RDW: 15 % (ref 11.5–15.5)
WBC: 7 10*3/uL (ref 4.0–10.5)
nRBC: 0 % (ref 0.0–0.2)

## 2022-08-07 MED ORDER — SODIUM ZIRCONIUM CYCLOSILICATE 10 G PO PACK
10.0000 g | PACK | Freq: Once | ORAL | Status: AC
  Administered 2022-08-07: 10 g via ORAL
  Filled 2022-08-07: qty 1

## 2022-08-07 NOTE — Plan of Care (Signed)
  Problem: Fluid Volume: Goal: Hemodynamic stability will improve Outcome: Progressing   Problem: Nutrition: Goal: Risk of aspiration will decrease Outcome: Progressing Goal: Dietary intake will improve Outcome: Progressing

## 2022-08-07 NOTE — Assessment & Plan Note (Signed)
hold off his lisinopril given acute kidney injury.

## 2022-08-07 NOTE — Assessment & Plan Note (Signed)
Continue statin therapy.

## 2022-08-07 NOTE — Assessment & Plan Note (Signed)
-   We will continue Aricept and Exelon patch. 

## 2022-08-07 NOTE — Progress Notes (Signed)
[facility]  - [place] [facility]   PATIENT NAME: Carl Figueroa    MR#:  [id number]  DATE OF BIRTH:  [birth year]  SUBJECTIVE:  patient at baseline has dementia. Wife at bedside. Feeding patient. Patient overall, but fidgety due to mittens in the hand. Brought in by wife due to altered mental status. Poor wife at baseline he recognizes family members. Started noticing he remain confused. No fever. Was recently admitted early part of December with intra-abdominal abscess has drain placed by IR   Completed antibiotic that was prescribed as outpatient.    VITALS:  Blood pressure 110/82, pulse 72, temperature 98.6 F (37 C), temperature source Oral, resp. rate 14, height 5\' 7"  (1.702 m), weight 58.1 kg, SpO2 98 %.  PHYSICAL EXAMINATION:   GENERAL:  76 y.o.-year-old patient lying in the bed with no acute distress.  LUNGS: Normal breath sounds bilaterally, no wheezing CARDIOVASCULAR: S1, S2 normal. No murmur   ABDOMEN: Soft, nontender, nondistended. Right sided ileostomy, right lower quadrant drain+ EXTREMITIES: No  edema b/l.    NEUROLOGIC: nonfocal  patient is alert and awake SKIN: No obvious rash, lesion, or ulcer.   LABORATORY PANEL:  CBC Recent Labs  Lab 08/07/22 0642  WBC 7.0  HGB 9.1*  HCT 30.1*  PLT 110*    Chemistries  Recent Labs  Lab 08/06/22 1909 08/07/22 0642  NA 143 145  K 4.4 5.4*  CL 113* 121*  CO2 25 21*  GLUCOSE 125* 128*  BUN 35* 37*  CREATININE 1.57* 1.31*  CALCIUM 9.7 9.7  AST 17  --   ALT 16  --   ALKPHOS 67  --   BILITOT 0.5  --    Cardiac Enzymes No results for input(s): "TROPONINI" in the last 168 hours. RADIOLOGY:  CT Angio Chest PE W/Cm &/Or Wo Cm  Result Date: 08/06/2022 CLINICAL DATA:  Sepsis Abdominal pain, acute, nonlocalized; Pulmonary embolism (PE) suspected, high prob EXAM: CT ANGIOGRAPHY CHEST CT ABDOMEN AND PELVIS WITH CONTRAST TECHNIQUE: Multidetector CT imaging of the chest was performed using the  standard protocol during bolus administration of intravenous contrast. Multiplanar CT image reconstructions and MIPs were obtained to evaluate the vascular anatomy. Multidetector CT imaging of the abdomen and pelvis was performed using the standard protocol during bolus administration of intravenous contrast. RADIATION DOSE REDUCTION: This exam was performed according to the departmental dose-optimization program which includes automated exposure control, adjustment of the mA and/or kV according to patient size and/or use of iterative reconstruction technique. CONTRAST:  33mL OMNIPAQUE IOHEXOL 350 MG/ML SOLN COMPARISON:  None Available. FINDINGS: CTA CHEST FINDINGS Cardiovascular: Satisfactory opacification of the pulmonary arteries to the segmental level. No evidence of pulmonary embolism. Normal heart size. No significant pericardial effusion. The thoracic aorta is normal in caliber. Mild atherosclerotic plaque of the thoracic aorta. Left anterior descending coronary artery calcifications. Mediastinum/Nodes: No enlarged mediastinal, hilar, or axillary lymph nodes. Thyroid gland, trachea, and esophagus demonstrate no significant findings. Lungs/Pleura: Elevated left hemidiaphragm. Left lower lobe linear atelectasis versus scarring. No focal consolidation. No pulmonary nodule. No pulmonary mass. No pleural effusion. No pneumothorax. Musculoskeletal: Bilateral trace gynecomastia.  No chest wall abnormality. No suspicious lytic or blastic osseous lesions. No acute displaced fracture. Multilevel degenerative changes of the spine. Review of the MIP images confirms the above findings. CT ABDOMEN and PELVIS FINDINGS Hepatobiliary: No focal liver abnormality. No gallstones, gallbladder wall thickening, or pericholecystic fluid. No biliary dilatation. Pancreas: Diffusely atrophic. No focal lesion. Otherwise normal pancreatic contour. No surrounding  inflammatory changes. No main pancreatic ductal dilatation. Spleen: Normal  in size without focal abnormality. Adrenals/Urinary Tract: No adrenal nodule bilaterally. Bilateral kidneys enhance symmetrically. Fluid density lesion within the left kidney likely represents a simple renal cyst. Simple renal cysts, in the absence of clinically indicated signs/symptoms, require no independent follow-up. No hydronephrosis. No hydroureter. The urinary bladder is not well visualized due to streak artifact originating from the left femoral surgical hardware. Foley catheter noted within the urinary bladder lumen with complete decompression of the urinary bladder. 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 Right lower quadrant ostomy formation. Majority of the bowel is decompressed. Stomach is within normal limits. No evidence of bowel wall thickening or dilatation. Limited evaluation of the active rectum due to streak artifact originating from the left femoral surgical hardware. Appendix appears normal. Vascular/Lymphatic: No abdominal aorta or iliac aneurysm. Mild atherosclerotic plaque of the aorta and its branches. No abdominal, pelvic, or inguinal lymphadenopathy. Reproductive: Not visualized due to streak artifact from the left femoral surgical hardware. Other: Right lower abdominal surgical drain with pigtail within the right mid abdomen just inferior to the liver and gallbladder. No intraperitoneal free fluid. No intraperitoneal free gas. No organized fluid collection. Musculoskeletal: No abdominal wall hernia or abnormality. No suspicious lytic or blastic osseous lesions. No acute displaced fracture. Grade 1 anterolisthesis of L4 on L5 and L5 on S1. Multilevel severe degenerative changes of the spine. Total left hip arthroplasty. At least moderate degenerative changes of the right hip. Review of the MIP images confirms the above findings. IMPRESSION: 1. No pulmonary embolus. 2. No acute intrathoracic abnormality. 3. No acute intra-abdominal or intrapelvic abnormality in  a patient with colectomy and right lower quadrant end ostomy. Limited evaluation due to lack of intraperitoneal fat, decompressed bowel, and streak artifact from left femoral surgical hardware. 4. Right lower abdominal surgical drain with pigtail within the right mid abdomen just inferior to the liver and gallbladder. Electronically Signed   By: Tish FredericksonMorgane  Naveau M.D.   On: 08/06/2022 21:03   CT Abdomen Pelvis W Contrast  Result Date: 08/06/2022 CLINICAL DATA:  Sepsis Abdominal pain, acute, nonlocalized; Pulmonary embolism (PE) suspected, high prob EXAM: CT ANGIOGRAPHY CHEST CT ABDOMEN AND PELVIS WITH CONTRAST TECHNIQUE: Multidetector CT imaging of the chest was performed using the standard protocol during bolus administration of intravenous contrast. Multiplanar CT image reconstructions and MIPs were obtained to evaluate the vascular anatomy. Multidetector CT imaging of the abdomen and pelvis was performed using the standard protocol during bolus administration of intravenous contrast. RADIATION DOSE REDUCTION: This exam was performed according to the departmental dose-optimization program which includes automated exposure control, adjustment of the mA and/or kV according to patient size and/or use of iterative reconstruction technique. CONTRAST:  80mL OMNIPAQUE IOHEXOL 350 MG/ML SOLN COMPARISON:  None Available. FINDINGS: CTA CHEST FINDINGS Cardiovascular: Satisfactory opacification of the pulmonary arteries to the segmental level. No evidence of pulmonary embolism. Normal heart size. No significant pericardial effusion. The thoracic aorta is normal in caliber. Mild atherosclerotic plaque of the thoracic aorta. Left anterior descending coronary artery calcifications. Mediastinum/Nodes: No enlarged mediastinal, hilar, or axillary lymph nodes. Thyroid gland, trachea, and esophagus demonstrate no significant findings. Lungs/Pleura: Elevated left hemidiaphragm. Left lower lobe linear atelectasis versus scarring. No  focal consolidation. No pulmonary nodule. No pulmonary mass. No pleural effusion. No pneumothorax. Musculoskeletal: Bilateral trace gynecomastia.  No chest wall abnormality. No suspicious lytic or blastic osseous lesions. No acute displaced fracture. Multilevel degenerative changes of the spine. Review of the  MIP images confirms the above findings. CT ABDOMEN and PELVIS FINDINGS Hepatobiliary: No focal liver abnormality. No gallstones, gallbladder wall thickening, or pericholecystic fluid. No biliary dilatation. Pancreas: Diffusely atrophic. No focal lesion. Otherwise normal pancreatic contour. No surrounding inflammatory changes. No main pancreatic ductal dilatation. Spleen: Normal in size without focal abnormality. Adrenals/Urinary Tract: No adrenal nodule bilaterally. Bilateral kidneys enhance symmetrically. Fluid density lesion within the left kidney likely represents a simple renal cyst. Simple renal cysts, in the absence of clinically indicated signs/symptoms, require no independent follow-up. No hydronephrosis. No hydroureter. The urinary bladder is not well visualized due to streak artifact originating from the left femoral surgical hardware. Foley catheter noted within the urinary bladder lumen with complete decompression of the urinary bladder. 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 Right lower quadrant ostomy formation. Majority of the bowel is decompressed. Stomach is within normal limits. No evidence of bowel wall thickening or dilatation. Limited evaluation of the active rectum due to streak artifact originating from the left femoral surgical hardware. Appendix appears normal. Vascular/Lymphatic: No abdominal aorta or iliac aneurysm. Mild atherosclerotic plaque of the aorta and its branches. No abdominal, pelvic, or inguinal lymphadenopathy. Reproductive: Not visualized due to streak artifact from the left femoral surgical hardware. Other: Right lower abdominal surgical  drain with pigtail within the right mid abdomen just inferior to the liver and gallbladder. No intraperitoneal free fluid. No intraperitoneal free gas. No organized fluid collection. Musculoskeletal: No abdominal wall hernia or abnormality. No suspicious lytic or blastic osseous lesions. No acute displaced fracture. Grade 1 anterolisthesis of L4 on L5 and L5 on S1. Multilevel severe degenerative changes of the spine. Total left hip arthroplasty. At least moderate degenerative changes of the right hip. Review of the MIP images confirms the above findings. IMPRESSION: 1. No pulmonary embolus. 2. No acute intrathoracic abnormality. 3. No acute intra-abdominal or intrapelvic abnormality in a patient with colectomy and right lower quadrant end ostomy. Limited evaluation due to lack of intraperitoneal fat, decompressed bowel, and streak artifact from left femoral surgical hardware. 4. Right lower abdominal surgical drain with pigtail within the right mid abdomen just inferior to the liver and gallbladder. Electronically Signed   By: Tish Frederickson M.D.   On: 08/06/2022 21:03   CT Head Wo Contrast  Result Date: 08/06/2022 CLINICAL DATA:  Mental status change, unknown cause EXAM: CT HEAD WITHOUT CONTRAST TECHNIQUE: Contiguous axial images were obtained from the base of the skull through the vertex without intravenous contrast. RADIATION DOSE REDUCTION: This exam was performed according to the departmental dose-optimization program which includes automated exposure control, adjustment of the mA and/or kV according to patient size and/or use of iterative reconstruction technique. COMPARISON:  CT head 01/11/2022 FINDINGS: Brain: Cerebral ventricle sizes are concordant with the degree of cerebral volume loss. Patchy and confluent areas of decreased attenuation are noted throughout the deep and periventricular white matter of the cerebral hemispheres bilaterally, compatible with chronic microvascular ischemic disease. No  evidence of large-territorial acute infarction. No parenchymal hemorrhage. No mass lesion. No extra-axial collection. No mass effect or midline shift. No hydrocephalus. Basilar cisterns are patent. Vascular: No hyperdense vessel. Skull: No acute fracture or focal lesion. Sinuses/Orbits: Bilateral maxillary sinus mucosal thickening. Otherwise paranasal sinuses and mastoid air cells are clear. Right lens replacement. Otherwise the orbits are unremarkable. Other: None. IMPRESSION: No acute intracranial abnormality. Electronically Signed   By: Tish Frederickson M.D.   On: 08/06/2022 20:52   DG Chest Port 1 View  Result Date: 08/06/2022  CLINICAL DATA:  Shortness of breath EXAM: PORTABLE CHEST 1 VIEW COMPARISON:  07/21/2022 FINDINGS: Scarring or atelectasis left base. No acute airspace disease. Stable normal cardiomediastinal silhouette with aortic atherosclerosis. No pneumothorax IMPRESSION: No active disease. Scarring or atelectasis at the left base. Electronically Signed   By: Jasmine Pang M.D.   On: 08/06/2022 19:23    Assessment and Plan  Carl Figueroa is a 76 y.o. male with medical history significant for osteoarthritis, anxiety, hypertension and Lewy body dementia, presented to the emergency room with acute onset of altered mental status with confusion over the last couple of days with diminished p.o. intake.  No fever or chills.  He was admitted here on 12 /2/23 and discharged on 12/ 8/23 after being managed for VRE bacteremia and intra-abdominal abscess for which she had a drain as well as AKI. Per wife patient completed a couise of PO zyvox  Abdominal pelvic CT scan as well as chest CTA revealed revealed the following: 1. No pulmonary embolus. 2. No acute intrathoracic abnormality. 3. No acute intra-abdominal or intrapelvic abnormality in a patient with colectomy and right lower quadrant end ostomy. Limited evaluation due to lack of intraperitoneal fat, decompressed bowel, and streak  artifact from left femoral surgical hardware. 4. Right lower abdominal surgical drain with pigtail within the right mid abdomen just inferior to the liver and gallbladder.   Acute metabolic encephalopathy/altered mental status acute renal failure known history of Lewy body dementia -- patient getting IV fluids. Creatinine improving. 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 She is requesting infectious disease see patient. Sent message to Dr. Montez Hageman -- recently finished antibiotic with zyvox for 7 days -- patient follows at the [facility]  Hyperkalemia -- will give lokelma  Lewy body dementia without behavioral disturbance -- continue Aricept and Exelon patch  Anxiety/depression -- continue Effexor, Zoloft, Klonopin  HTN Relative Hypotension --cont IVF --hold BP meds  PT/OT to see pt  Family communication :wife at bedside Consults :ID CODE STATUS: FULL DVT Prophylaxis :lovenox Level of care: Med-Surg Status is: Inpatient Remains inpatient appropriate because: AKI, AMS D/c home 1-2 days    TOTAL TIME TAKING CARE OF THIS PATIENT: 35 minutes.  >50% time spent on counselling and coordination of care  Note: This dictation was prepared with Dragon dictation along with smaller phrase technology. Any transcriptional errors that result from this process are unintentional.  Enedina Finner M.D    [facility]   CC: Primary care physician; Mick Sell, MD

## 2022-08-07 NOTE — ED Notes (Signed)
Pt appears calmer at this time, there was a NT sitting at his bedside earlier to prevent him from trying to get out of bed and pull at his lines. However as he has appeared to calm down the tech left. This RN will continue to monitor.

## 2022-08-07 NOTE — ED Notes (Signed)
Attempted to get morning labs, pt became agitated and pulling away from this RN, Will attempt again when I can get assistance.

## 2022-08-07 NOTE — Assessment & Plan Note (Addendum)
-   We will continue his Klonopin as well as Effexor XR and Zoloft.

## 2022-08-07 NOTE — ED Notes (Signed)
This RN entered pts room to round on him. Wife was not in the room any longer. Pt had pulled out his IV and had NS wet his blankets. This RN and another staff member cleaned him up, his colostomy was emptied. Another IV was started with the help of multiple staff members to prevent the pt from hitting the RN starting the IV. Pt continues to be confused and was pulling at lines and his catheter. Mitts were placed to prevent pt from pulling any other tube or line out. Pt now has new warm blankets.

## 2022-08-07 NOTE — Consult Note (Addendum)
NAME: Carl Figueroa  DOB: 06/09/1946  MRN: [id number]  Date/Time: 08/07/2022 4:38 PM  REQUESTING PROVIDER; Dr.PAtel Subjective:  REASON FOR CONSULT: altered mental status- r/o infection ? [place] Carl Figueroa is a 76 y.o. 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 he had intraabdominal abscesses and had drain placed- He had enterococcus ( resistant to penicillin)  and candida in the culture from 11/8. It looks like he was dc on linezolid- He came to our ED on 07/07/22 with Altered mental status A ct abdomen revealed A pigtail catheter is noted entering the lateral right mid abdominal wall with the pigtail within a right mid abdominal small bowel segment.  a repeat Ct on 11/24 showed Right abdominal percutaneous drain within a intermediate density fluid collection along the anterior right abdominal wall tracking inferiorly to the superior aspect of the bladder and superiorly to the undersurface of the right hepatic lobe. This measures approximately 3.6 x 2.0 x 11.4 cm (series 2/image 39 and series 5/image 29). This was slightly decreased in size compared to 07/07/2022 when it measured approximately 4.2 x 2.4 x 15 cm. The gaseous component of the fluid collection had resolved. Not sure why the drain was removed by surgery on 07/16/22 ( it says due to low output but there was still a collection) He basically was treated like a left lower lobe pneumonia with sepsis  HE was given vanco, then augmentin/linezolid He was discharged on 07/17/22 He also was noted to have rt  index finger gangrene and had a vascular consult with conservative management He was admitted to [facility] on 07/21/22  with decreased urinary output and poor intake- Blood culture then had 1/4 VRE bacteremia and he was started on IV dapto/then PO  linezolid on discharge CT abdomen and pelvis done on 07/23/22 showed a residual abscess of slightly reduced size from previous CT and he had a drain placed by IR on 07/25/22. Culture from the abscess was no growth Pt was discharged on 07/27/22  Pt was treated with total of 12 days  of  appropriate Antibiotic linezolid and finished on 08/03/22. HE presents again from home on 08/06/22 with altered mental status Vitals in the ED  08/06/22  BP 103/58 !  Temp 98.2 F (36.8 C)  Pulse Rate 81  Resp 16  SpO2 100 %    Latest Reference Range & Units 08/06/22  WBC 4.0 - 10.5 K/uL 10.6 (H)  Hemoglobin 13.0 - 17.0 g/dL 8.7 (L)  HCT 08.6 - 57.8 % 28.9 (L)  Platelets 150 - 400 K/uL 110 (L)  Creatinine 0.61 - 1.24 mg/dL 4.69 (H)  Lactate 1.2 Blood culture sent CT head no acute abnromality CT abd / and chest The intra abdominal abscess was not seen- Drain in place I am seeing him to r/o any infection Wife at bed side  Past Medical History:  Diagnosis Date   Anxiety    Arthritis    Hypertension    Lewy body dementia (HCC)     Past Surgical History:  Procedure Laterality Date   COLON SURGERY  COLONOSCOPY     COLOSTOMY Right    FOOT SURGERY     X2   HERNIA REPAIR Right    INGUINAL    perforated bowel  05/2022   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   Procedure: TOTAL HIP ARTHROPLASTY ANTERIOR APPROACH;  Surgeon: Kennedy Bucker, MD;  Location: [facility] ORS;  Service: Orthopedics;  Laterality: Left;    Social History   Socioeconomic History   Marital status: Married    Spouse name: Not on file   Number of children: Not on file   Years of education: Not on file   Highest education level: Not on file  Occupational History   Not on file  Tobacco  Use   Smoking status: Former    Packs/day: 0.25    Years: 20.00    Total pack years: 5.00    Types: Cigarettes    Quit date: 10/28/2019    Years since quitting: 2.7   Smokeless tobacco: Never  Vaping Use   Vaping Use: Never used  Substance and Sexual Activity   Alcohol use: No   Drug use: No   Sexual activity: Not on file  Other Topics Concern   Not on file  Social History Narrative   Not on file   Social Determinants of Health   Financial Resource Strain: Not on file  Food Insecurity: No Food Insecurity (07/23/2022)   Hunger Vital Sign    Worried About Running Out of Food in the Last Year: Never true    Ran Out of Food in the Last Year: Never true  Transportation Needs: No Transportation Needs (07/23/2022)   PRAPARE - Administrator, Civil Service (Medical): No    Lack of Transportation (Non-Medical): No  Physical Activity: Not on file  Stress: Not on file  Social Connections: Not on file  Intimate Partner Violence: Not At Risk (07/23/2022)   Humiliation, Afraid, Rape, and Kick questionnaire    Fear of Current or Ex-Partner: No    Emotionally Abused: No    Physically Abused: No    Sexually Abused: No    Family History  Problem Relation Age of Onset   Cancer Mother    Diabetes Mellitus II Neg Hx    Allergies  Allergen Reactions   Morphine Nausea Only   I? Current Facility-Administered Medications  Medication Dose Route Frequency Provider Last Rate Last Admin   0.9 %  sodium chloride infusion   Intravenous Continuous Enedina Finner, MD 100 mL/hr at 08/07/22 0050 New Bag at 08/07/22 0050   acetaminophen (TYLENOL) tablet 650 mg  650 mg Oral Q6H PRN Mansy, Jan A, MD       Or   acetaminophen (TYLENOL) suppository 650 mg  650 mg Rectal Q6H PRN Mansy, Jan A, MD       enoxaparin (LOVENOX) injection 40 mg  40 mg Subcutaneous Q24H Mansy, Jan A, MD   40 mg at 08/07/22 0051   feeding supplement (ENSURE ENLIVE / ENSURE PLUS) liquid 237 mL  237 mL Oral TID BM Mansy,  Jan A, MD   237 mL at 08/07/22 1541   magnesium hydroxide (MILK OF MAGNESIA) suspension 30 mL  30 mL Oral Daily PRN Mansy, Jan A, MD       ondansetron [facility]) tablet 4 mg  4 mg Oral Q6H PRN Mansy, Jan A, MD       Or   ondansetron [facility]) injection 4 mg  4 mg Intravenous Q6H PRN Mansy, Vernetta Honey, MD  sertraline (ZOLOFT) tablet 25 mg  25 mg Oral Daily Mansy, Jan A, MD       sodium zirconium cyclosilicate (LOKELMA) packet 10 g  10 g Oral Once Enedina FinnerPatel, Sona, MD       traZODone (DESYREL) tablet 25 mg  25 mg Oral QHS PRN Mansy, Vernetta HoneyJan A, MD         Abtx:  Anti-infectives (From admission, onward)    None       REVIEW OF SYSTEMS:  NA Objective:  VITALS:  BP 110/82 (BP Location: Left Arm)   Pulse 72   Temp 98.6 F (37 C) (Oral)   Resp 14   Ht 5\' 7"  (1.702 m)   Wt 58.1 kg   SpO2 98%   BMI 20.05 kg/m   PHYSICAL EXAM:  General: Awake, confused restless, , but no distress, does not look septic ,  Head: Normocephalic, without obvious abnormality, atraumatic. Eyes: Conjunctivae clear, anicteric sclerae. Pupils are equal ENT Nares normal. No drainage or sinus tenderness. Lips, mucosa, and tongue normal. No Thrush Neck symmetrical, no adenopathy, thyroid: non tender no carotid bruit and no JVD. Lungs: b/l air entry Heart: s1s2 Abdomen: Soft, ileostomy Rt lower quadrant drain Foley in place Hematoma from the penis Extremities: atraumatic, no cyanosis. No edema. No clubbing Skin: No rashes or lesions. Or bruising Lymph: Cervical, supraclavicular normal. Neurologic: moves all four limbs Pertinent Labs Lab Results CBC    Component Value Date/Time   WBC 7.0 08/07/2022 0642   RBC 3.24 (L) 08/07/2022 0642   HGB 9.1 (L) 08/07/2022 0642   HGB 13.5 08/03/2021 1043   HCT 30.1 (L) 08/07/2022 0642   HCT 41.6 08/03/2021 1043   PLT 110 (L) 08/07/2022 0642   MCV 92.9 08/07/2022 0642   MCV 87 08/03/2021 1043   MCH 28.1 08/07/2022 0642   MCHC 30.2 08/07/2022 0642   RDW 15.0 08/07/2022  0642   RDW 12.9 08/03/2021 1043   LYMPHSABS 0.2 (L) 08/06/2022 1909   LYMPHSABS 1.1 08/03/2021 1043   MONOABS 0.3 08/06/2022 1909   EOSABS 0.0 08/06/2022 1909   EOSABS 0.1 08/03/2021 1043   BASOSABS 0.0 08/06/2022 1909   BASOSABS 0.0 08/03/2021 1043       Latest Ref Rng & Units 08/07/2022    3:47 PM 08/07/2022    6:42 AM 08/06/2022    7:09 PM  CMP  Glucose 70 - 99 mg/dL 161121  096128  045125   BUN 8 - 23 mg/dL 38  37  35   Creatinine 0.61 - 1.24 mg/dL 4.091.44  8.111.31  9.141.57   Sodium 135 - 145 mmol/L 146  145  143   Potassium 3.5 - 5.1 mmol/L 4.9  5.4  4.4   Chloride 98 - 111 mmol/L 117  121  113   CO2 22 - 32 mmol/L 24  21  25    Calcium 8.9 - 10.3 mg/dL 9.8  9.7  9.7   Total Protein 6.5 - 8.1 g/dL   6.8   Total Bilirubin 0.3 - 1.2 mg/dL   0.5   Alkaline Phos 38 - 126 U/L   67   AST 15 - 41 U/L   17   ALT 0 - 44 U/L   16       Microbiology: Recent Results (from the past 240 hour(s))  Resp panel by RT-PCR (RSV, Flu A&B, Covid) Anterior Nasal Swab     Status: None   Collection Time: 08/06/22  7:09 PM   Specimen: Anterior Nasal Swab  Result Value Ref Range Status   SARS Coronavirus 2 by RT PCR NEGATIVE NEGATIVE Final    Comment: (NOTE) SARS-CoV-2 target nucleic acids are NOT DETECTED.  The SARS-CoV-2 RNA is generally detectable in upper respiratory specimens during the acute phase of infection. The lowest concentration of SARS-CoV-2 viral copies this assay can detect is 138 copies/mL. A negative result does not preclude SARS-Cov-2 infection and should not be used as the sole basis for treatment or other patient management decisions. A negative result may occur with  improper specimen collection/handling, submission of specimen other than nasopharyngeal swab, presence of viral mutation(s) within the areas targeted by this assay, and inadequate number of viral copies(<138 copies/mL). A negative result must be combined with clinical observations, patient history, and  epidemiological information. The expected result is Negative.  Fact Sheet for Patients:  BloggerCourse.com  Fact Sheet for Healthcare Providers:  SeriousBroker.it  This test is no t yet approved or cleared by the [place] FDA and  has been authorized for detection and/or diagnosis of SARS-CoV-2 by FDA under an Emergency Use Authorization (EUA). This EUA will remain  in effect (meaning this test can be used) for the duration of the COVID-19 declaration under Section 564(b)(1) of the Act, 21 U.S.C.section 360bbb-3(b)(1), unless the authorization is terminated  or revoked sooner.       Influenza A by PCR NEGATIVE NEGATIVE Final   Influenza B by PCR NEGATIVE NEGATIVE Final    Comment: (NOTE) The Xpert Xpress SARS-CoV-2/FLU/RSV plus assay is intended as an aid in the diagnosis of influenza from Nasopharyngeal swab specimens and should not be used as a sole basis for treatment. Nasal washings and aspirates are unacceptable for Xpert Xpress SARS-CoV-2/FLU/RSV testing.  Fact Sheet for Patients: BloggerCourse.com  Fact Sheet for Healthcare Providers: SeriousBroker.it  This test is not yet approved or cleared by the [place] FDA and has been authorized for detection and/or diagnosis of SARS-CoV-2 by FDA under an Emergency Use Authorization (EUA). This EUA will remain in effect (meaning this test can be used) for the duration of the COVID-19 declaration under Section 564(b)(1) of the Act, 21 U.S.C. section 360bbb-3(b)(1), unless the authorization is terminated or revoked.     Resp Syncytial Virus by PCR NEGATIVE NEGATIVE Final    Comment: (NOTE) Fact Sheet for Patients: BloggerCourse.com  Fact Sheet for Healthcare Providers: SeriousBroker.it  This test is not yet approved or cleared by the [place] FDA and has been  authorized for detection and/or diagnosis of SARS-CoV-2 by FDA under an Emergency Use Authorization (EUA). This EUA will remain in effect (meaning this test can be used) for the duration of the COVID-19 declaration under Section 564(b)(1) of the Act, 21 U.S.C. section 360bbb-3(b)(1), unless the authorization is terminated or revoked.  Performed at [facility], [address] [place], [place] [address]   Blood Culture (routine x 2)     Status: None (Preliminary result)   Collection Time: 08/06/22  8:10 PM   Specimen: BLOOD LEFT ARM  Result Value Ref Range Status   Specimen Description BLOOD LEFT ARM  Final   Special Requests   Final    BOTTLES DRAWN AEROBIC AND ANAEROBIC Blood Culture results may not be optimal due to an inadequate volume of blood received in culture bottles   Culture   Final    NO GROWTH < 12 HOURS Performed at [facility], [address] [place], [place] [address]    Report Status PENDING  Incomplete  Blood Culture (  routine x 2)     Status: None (Preliminary result)   Collection Time: 08/06/22  8:10 PM   Specimen: BLOOD  Result Value Ref Range Status   Specimen Description BLOOD BLOOD RIGHT ARM  Final   Special Requests   Final    BOTTLES DRAWN AEROBIC AND ANAEROBIC Blood Culture results may not be optimal due to an inadequate volume of blood received in culture bottles   Culture   Final    NO GROWTH < 12 HOURS Performed at [facility], [address], [place], [place] [address]    Report Status PENDING  Incomplete    IMAGING RESULTS: CT head - no acute abnormality CT abdomen- no obvious intraabdominal collection  I have personally reviewed the films ? Impression/Recommendation Altered mental status / metabolic encephalopathy with underlying Lewy body dementia- does not look septic- no fever, no leucocytosis, no increas ein lactate, stable vitals D.D - poor intake dehydration  ?medication ( was on linezolid and SSRI-  but linezolid stopped 72 hrs ago ? Infection- Urine ? 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 ___________________________________________________ Discussed with wife at bedside and requesting provider Note:  This document was prepared using Dragon voice recognition software and may include unintentional dictation errors.

## 2022-08-07 NOTE — Assessment & Plan Note (Addendum)
Creatinine 1.57 on presentation and baseline around 1.19.  Today's creatinine 1.1.  Continue to hold Lasix and lisinopril.

## 2022-08-08 ENCOUNTER — Inpatient Hospital Stay: Payer: No Typology Code available for payment source | Admitting: Radiology

## 2022-08-08 ENCOUNTER — Other Ambulatory Visit: Payer: Self-pay

## 2022-08-08 ENCOUNTER — Encounter: Payer: Self-pay | Admitting: Internal Medicine

## 2022-08-08 DIAGNOSIS — F419 Anxiety disorder, unspecified: Secondary | ICD-10-CM | POA: Diagnosis not present

## 2022-08-08 DIAGNOSIS — I10 Essential (primary) hypertension: Secondary | ICD-10-CM

## 2022-08-08 DIAGNOSIS — R338 Other retention of urine: Secondary | ICD-10-CM

## 2022-08-08 DIAGNOSIS — L89302 Pressure ulcer of unspecified buttock, stage 2: Secondary | ICD-10-CM | POA: Insufficient documentation

## 2022-08-08 DIAGNOSIS — F0390 Unspecified dementia without behavioral disturbance: Secondary | ICD-10-CM

## 2022-08-08 DIAGNOSIS — E785 Hyperlipidemia, unspecified: Secondary | ICD-10-CM

## 2022-08-08 DIAGNOSIS — R4182 Altered mental status, unspecified: Secondary | ICD-10-CM

## 2022-08-08 DIAGNOSIS — N179 Acute kidney failure, unspecified: Secondary | ICD-10-CM | POA: Diagnosis not present

## 2022-08-08 DIAGNOSIS — G9341 Metabolic encephalopathy: Secondary | ICD-10-CM

## 2022-08-08 DIAGNOSIS — N401 Enlarged prostate with lower urinary tract symptoms: Secondary | ICD-10-CM

## 2022-08-08 DIAGNOSIS — F32A Depression, unspecified: Secondary | ICD-10-CM

## 2022-08-08 HISTORY — PX: IR RADIOLOGIST EVAL & MGMT: IMG5224

## 2022-08-08 LAB — C DIFFICILE QUICK SCREEN W PCR REFLEX
C Diff antigen: NEGATIVE
C Diff interpretation: NOT DETECTED
C Diff toxin: NEGATIVE

## 2022-08-08 MED ORDER — CHLORHEXIDINE GLUCONATE CLOTH 2 % EX PADS
6.0000 | MEDICATED_PAD | Freq: Every day | CUTANEOUS | Status: DC
  Administered 2022-08-08: 6 via TOPICAL

## 2022-08-08 MED ORDER — FINASTERIDE 5 MG PO TABS
5.0000 mg | ORAL_TABLET | Freq: Every day | ORAL | Status: DC
  Administered 2022-08-08 – 2022-08-09 (×2): 5 mg via ORAL
  Filled 2022-08-08 (×2): qty 1

## 2022-08-08 MED ORDER — VITAMIN C 500 MG PO TABS
500.0000 mg | ORAL_TABLET | Freq: Two times a day (BID) | ORAL | Status: DC
  Administered 2022-08-08 – 2022-08-09 (×2): 500 mg via ORAL
  Filled 2022-08-08 (×2): qty 1

## 2022-08-08 MED ORDER — TAMSULOSIN HCL 0.4 MG PO CAPS
0.4000 mg | ORAL_CAPSULE | Freq: Every day | ORAL | Status: DC
  Administered 2022-08-08 – 2022-08-09 (×2): 0.4 mg via ORAL
  Filled 2022-08-08 (×2): qty 1

## 2022-08-08 MED ORDER — ADULT MULTIVITAMIN W/MINERALS CH
1.0000 | ORAL_TABLET | Freq: Every day | ORAL | Status: DC
  Administered 2022-08-08 – 2022-08-09 (×2): 1 via ORAL
  Filled 2022-08-08 (×2): qty 1

## 2022-08-08 MED ORDER — ZINC SULFATE 220 (50 ZN) MG PO CAPS
220.0000 mg | ORAL_CAPSULE | Freq: Every day | ORAL | Status: DC
  Administered 2022-08-08 – 2022-08-09 (×2): 220 mg via ORAL
  Filled 2022-08-08 (×2): qty 1

## 2022-08-08 NOTE — Progress Notes (Signed)
Progress Note   Patient: Carl Figueroa KGM:[id number] DOB: 1946-01-23 DOA: 08/06/2022     1 DOS: the patient was seen and examined on 08/08/2022   Brief hospital course: 76 y.o. male with medical history significant for osteoarthritis, anxiety, hypertension and Lewy body dementia, presented to the emergency room with acute onset of altered mental status with confusion over the last couple of days with diminished p.o. intake.  No fever or chills.  He was admitted here on 12 /2/23 and discharged on 12/ 8/23 after being managed for VRE bacteremia and intra-abdominal abscess for which she had a drain as well as AKI. Per wife patient completed a couise of PO zyvox.  Repeat CT scan did not show any further abscesses in the abdomen. Spoke with interventional radiology and abdominal drain removed today 08/08/22. Will also do a voiding trial and remove Foley today on 08/08/2022.  Assessment and Plan: * AKI (acute kidney injury) (HCC) Creatinine 1.57 on presentation and baseline around 1.19.  Today's creatinine 1.44.  Continue gentle IV fluid hydration.  Acute metabolic encephalopathy Mental status has improved from presentation.  Pressure injury of buttock, stage 2 (HCC) Present on admission, see full description below.  Dementia without behavioral disturbance (HCC) - We will continue Aricept and Exelon patch.  BPH (benign prostatic hyperplasia) With urinary retention.  Discontinue Foley and do a voiding trial while here.  Continue Proscar and Flomax.  Dyslipidemia - Continue statin therapy.  Anxiety and depression - We will continue his Klonopin as well as Effexor XR and Zoloft.  Essential hypertension hold off his lisinopril given acute kidney injury.        Subjective: Patient able to have a conversation with me and follows some simple commands.  Brought in with altered mental status.  Physical Exam: Vitals:   08/07/22 0912 08/07/22 1857 08/08/22 0736 08/08/22 1306   BP: 110/82 (!) 108/49 (!) 121/56 (!) 147/73  Pulse: 72 (!) 104 64 72  Resp: 14 18 17 16   Temp: 98.6 F (37 C) (!) 97.5 F (36.4 C) (!) 97.2 F (36.2 C) 97.6 F (36.4 C)  TempSrc: Oral     SpO2: 98%  100% 100%  Weight:      Height:       Physical Exam HENT:     Head: Normocephalic.     Mouth/Throat:     Pharynx: No oropharyngeal exudate.  Eyes:     General: Lids are normal.     Conjunctiva/sclera: Conjunctivae normal.  Cardiovascular:     Rate and Rhythm: Normal rate and regular rhythm.     Heart sounds: Normal heart sounds, S1 normal and S2 normal.  Pulmonary:     Breath sounds: No decreased breath sounds, wheezing, rhonchi or rales.  Abdominal:     Palpations: Abdomen is soft.     Tenderness: There is no abdominal tenderness.  Musculoskeletal:     Right lower leg: Swelling present.     Left lower leg: Swelling present.  Skin:    General: Skin is warm.     Findings: No rash.  Neurological:     Mental Status: He is alert.     Comments: Able to follow some simple commands.     Data Reviewed: Sodium 146, potassium 4.9, creatinine 1.44, hemoglobin 9.1, platelet count 110  Family Communication: Spoke with patient's wife on the phone  Disposition: Status is: Inpatient Remains inpatient appropriate because: Continue IV fluid hydration today and reassess tomorrow.  Abdominal abscess drain removed.  Voiding trial  with Foley removal today.  Planned Discharge Destination: Home    Time spent: 28 minutes  Author: Alford Highland, MD 08/08/2022 2:15 PM  For on call review [email or web address].

## 2022-08-08 NOTE — Progress Notes (Signed)
Initial Nutrition Assessment  DOCUMENTATION CODES:   Severe malnutrition in context of chronic illness  INTERVENTION:   -Liberalize diet to regular for widest variety of meal selections -MVI with minerals daily -Ensure Enlive po TID, each supplement provides 350 kcal and 20 grams of protein.  -500 mg vitamin C BID -220 mg zinc sulfate daily x 14 days  NUTRITION DIAGNOSIS:   Severe Malnutrition related to chronic illness (dementia) as evidenced by percent weight loss, moderate fat depletion, severe fat depletion, moderate muscle depletion, severe muscle depletion.  GOAL:   Patient will meet greater than or equal to 90% of their needs  MONITOR:   PO intake, Supplement acceptance  REASON FOR ASSESSMENT:   Malnutrition Screening Tool    ASSESSMENT:   Pt with medical history significant for osteoarthritis, anxiety, hypertension and Lewy body dementia, presented with acute onset of altered mental status with confusion over the last couple of days with diminished p.o. intake  Pt admitted with AKI, dementia, and AMS.   12/20- drain removed  Reviewed I/O's: -1.1 L x 24 hours and +800 ml x 24 hours  UOP: 1 L x 24 hours  Pt lying in bed at time of bed. He arouse easily when name was called. Pt was pleasant, but unable to provide meaningful history. Noted pt consumed about 75% of lunch meal. Per pt, he has a good appetite and likes Ensure supplements. No family present to provide further history.   Reviewed wt hx; pt has experienced a 23.9% wt loss over the past 3 months, which is significant for time frame.   Medications reviewed.   Per TOC notes, plan for resumption of home health services at discharge.   Lab Results  Component Value Date   HGBA1C 5.8 (H) 07/08/2022   PTA DM medications are none.   Labs reviewed: Na: 146, CBGS: 81 (inpatient orders for glycemic control are none).    NUTRITION - FOCUSED PHYSICAL EXAM:  Flowsheet Row Most Recent Value  Orbital Region  Moderate depletion  Upper Arm Region Severe depletion  Thoracic and Lumbar Region Moderate depletion  Buccal Region Severe depletion  Temple Region Severe depletion  Clavicle Bone Region Severe depletion  Clavicle and Acromion Bone Region Severe depletion  Scapular Bone Region Severe depletion  Dorsal Hand Moderate depletion  Patellar Region Moderate depletion  Anterior Thigh Region Moderate depletion  Posterior Calf Region Moderate depletion  Edema (RD Assessment) None  Hair Reviewed  Eyes Reviewed  Mouth Reviewed  Skin Reviewed  Nails Reviewed       Diet Order:   Diet Order             Diet Heart Room service appropriate? Yes; Fluid consistency: Thin  Diet effective now                   EDUCATION NEEDS:   Education needs have been addressed  Skin:  Skin Assessment: Skin Integrity Issues: Skin Integrity Issues:: Stage II Stage II: buttocks, coccyx  Last BM:  08/08/22 (type 7 via colostomy)  Height:   Ht Readings from Last 1 Encounters:  08/07/22 5\' 7"  (1.702 m)    Weight:   Wt Readings from Last 1 Encounters:  08/07/22 58.1 kg    Ideal Body Weight:  67.2 kg  BMI:  Body mass index is 20.05 kg/m.  Estimated Nutritional Needs:   Kcal:  2050-2250  Protein:  115-130 grams  Fluid:  > 2 L    03-22-1979, RD, LDN, CDCES Registered Dietitian  II Certified Diabetes Care and Education Specialist Please refer to [facility] for RD and/or RD on-call/weekend/after hours pager

## 2022-08-08 NOTE — Evaluation (Signed)
Occupational Therapy Evaluation Patient Details Name: Carl Figueroa MRN: [id number] DOB: [birth year] Today's Date: 08/08/2022   History of Present Illness Carl Figueroa is a 76yoM who comes to [facility] on 08/06/22 from home with AMS, O2 sats 60% on room air. Was recently admitted early part of December with intra-abdominal abscess has drain placed by IR. 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   Clinical Impression   Patient presenting with decreased independence in self care, balance, functional mobility/transfers, endurance, and safety awareness. Pt oriented to self only. Pt poor historian 2/2 baseline cognitive deficits. Information obtained from chart review and recent hospitalization. Prior to admission, pt was receiving assistance from wife for all ADLs/IADLs and functional mobility (mainly transfers since last hospitalization). During evaluation, pt required CGA-Min A for bed mobility, Mod-Max A to stand from EOB, and Max A to take a couple lateral steps toward HOB using RW. Pt with heavy posterior lean in standing requiring assistance to maintain balance. Pt is currently requiring Mod-Max A for dressing tasks. Pt will benefit from acute OT to increase overall independence in the areas of ADLs, functional mobility in order to safely discharge home.   Recommendations for follow up therapy are one component of a multi-disciplinary discharge planning process, led by the attending physician.  Recommendations may be updated based on patient status, additional functional criteria and insurance authorization.   Follow Up Recommendations  Home health OT (wife prefers to meet pt's care needs in home)     Assistance Recommended at Discharge Frequent or constant Supervision/Assistance  Patient can return home with the following Assistance with cooking/housework;Assist for transportation;Help with stairs or ramp for entrance;Assistance with  feeding;Direct supervision/assist for medications management;Direct supervision/assist for financial management;A lot of help with walking and/or transfers;A lot of help with bathing/dressing/bathroom    Functional Status Assessment  Patient has had a recent decline in their functional status and demonstrates the ability to make significant improvements in function in a reasonable and predictable amount of time.  Equipment Recommendations  None recommended by OT    Recommendations for Other Services       Precautions / Restrictions Precautions Precautions: Fall Precaution Comments: foley, JP drain Restrictions Weight Bearing Restrictions: No      Mobility Bed Mobility Overal bed mobility: Needs Assistance Bed Mobility: Supine to Sit, Sit to Supine     Supine to sit: Min assist (for trunk elevation) Sit to supine: Min guard        Transfers Overall transfer level: Needs assistance Equipment used: Rolling walker (2 wheels) Transfers: Sit to/from Stand Sit to Stand: Mod assist, Max assist           General transfer comment: tactile cues for hand placement      Balance Overall balance assessment: Needs assistance Sitting-balance support: Feet supported, Bilateral upper extremity supported Sitting balance-Leahy Scale: Fair   Postural control: Posterior lean Standing balance support: Bilateral upper extremity supported, During functional activity Standing balance-Leahy Scale: Poor Standing balance comment: Max A for standing balance 2/2 heavy posterior lean                           ADL either performed or assessed with clinical judgement   ADL Overall ADL's : Needs assistance/impaired Eating/Feeding: Set up;Bed level   Grooming: Set up;Sitting;Wash/dry face Grooming Details (indicate cue type and reason): handed pt warm washcloth and able to initiate [facility]  Upper Body Dressing : Moderate assistance;Sitting;Cueing for sequencing   Lower  Body Dressing: Maximal assistance;Cueing for sequencing;Sitting/lateral leans       Toileting- Clothing Manipulation and Hygiene: Total assistance Toileting - Clothing Manipulation Details (indicate cue type and reason): ostomy care     Functional mobility during ADLs: Rolling walker (2 wheels);Maximal assistance (to take a couple lateral steps toward [facility], assist for RW management and weightshifting)       Vision Patient Visual Report: No change from baseline       Perception     Praxis      Pertinent Vitals/Pain Pain Assessment Pain Assessment: Faces Faces Pain Scale: Hurts a little bit Pain Location: unable to state location Pain Intervention(s): Patient requesting pain meds-RN notified     Hand Dominance Right   Extremity/Trunk Assessment Upper Extremity Assessment Upper Extremity Assessment: Generalized weakness   Lower Extremity Assessment Lower Extremity Assessment: Generalized weakness       Communication Communication Communication: No difficulties   Cognition Arousal/Alertness: Awake/alert Behavior During Therapy: WFL for tasks assessed/performed Overall Cognitive Status: History of cognitive impairments - at baseline Area of Impairment: Orientation, Attention, Problem solving, Memory, Following commands, Safety/judgement, Awareness                 Orientation Level: Disoriented to, Place, Time, Situation Current Attention Level: Focused Memory: Decreased recall of precautions, Decreased short-term memory Following Commands: Follows one step commands inconsistently Safety/Judgement: Decreased awareness of safety, Decreased awareness of deficits Awareness: Intellectual Problem Solving: Slow processing, Decreased initiation, Difficulty sequencing, Requires verbal cues, Requires tactile cues General Comments: History of lewy body dementia. Pt oriented to self only; reported location as [facility] Comments       Exercises Other  Exercises Other Exercises: OT provided education re: role of OT, OT POC, post acute recs, sitting up for all meals, EOB/OOB mobility with assistance, home/fall safety.     Shoulder Instructions      Home Living Family/patient expects to be discharged to:: Private residence Living Arrangements: Spouse/significant other Available Help at Discharge: Family Type of Home: House Home Access: Ramped entrance     Home Layout: One level     Bathroom Shower/Tub: Sponge bathes at baseline         Home Equipment: Wheelchair - power;Insurance risk surveyor (2 wheels)          Prior Functioning/Environment Prior Level of Function : Needs assist  Cognitive Assist : Mobility (cognitive);ADLs (cognitive) Mobility (Cognitive): Step by step cues ADLs (Cognitive): Step by step cues Physical Assist : Mobility (physical);ADLs (physical) Mobility (physical): Bed mobility;Transfers;Gait ADLs (physical): Grooming;Bathing;Dressing;Toileting;IADLs;Feeding Mobility Comments: Months ago AMB short distances only in home; over past several weeks pt has been transfers only due to repeat illness/hospitalization ADLs Comments: Wife assist with all ADLs/IADLs        OT Problem List: Decreased strength;Impaired balance (sitting and/or standing);Decreased activity tolerance;Decreased safety awareness;Decreased cognition;Decreased knowledge of precautions;Decreased knowledge of use of DME or AE;Pain      OT Treatment/Interventions: Self-care/ADL training;Patient/family education;Therapeutic exercise;Balance training;Energy conservation;Therapeutic activities;DME and/or AE instruction;Cognitive remediation/compensation    OT Goals(Current goals can be found in the care plan section) Acute Rehab OT Goals Patient Stated Goal: none stated OT Goal Formulation: Patient unable to participate in goal setting Time For Goal Achievement: 08/22/22 Potential to Achieve Goals: Fair   OT Frequency: Min  2X/week    Co-evaluation              AM-PAC OT "6 Clicks"  Daily Activity     Outcome Measure Help from another person eating meals?: A Little Help from another person taking care of personal grooming?: A Lot Help from another person toileting, which includes using toliet, bedpan, or urinal?: Total Help from another person bathing (including washing, rinsing, drying)?: A Lot Help from another person to put on and taking off regular upper body clothing?: A Lot Help from another person to put on and taking off regular lower body clothing?: A Lot 6 Click Score: 12   End of Session Equipment Utilized During Treatment: Gait belt;Rolling walker (2 wheels) Nurse Communication: Mobility status;Other (comment) (mitts in place)  Activity Tolerance: Patient tolerated treatment well Patient left: in bed;with call bell/phone within reach;with bed alarm set  OT Visit Diagnosis: Unsteadiness on feet (R26.81);Muscle weakness (generalized) (M62.81);Other symptoms and signs involving cognitive function;Pain                Time: 0131-4388 OT Time Calculation (min): 22 min Charges:  OT General Charges $OT Visit: 1 Visit OT Evaluation $OT Eval Moderate Complexity: 1 Mod  [facility] MS, OTR/L ascom [phone]  08/08/22, 2:50 PM

## 2022-08-08 NOTE — Assessment & Plan Note (Signed)
Present on admission, see full description below. 

## 2022-08-08 NOTE — Evaluation (Addendum)
Physical Therapy Evaluation Patient Details Name: Carl Figueroa MRN: [id number] DOB: 05-18-1946 Today's Date: 08/08/2022  History of Present Illness  Carl Figueroa is a 76yoM who comes to [facility] on 08/06/22 from home with AMS, O2 sats 60% on room air. Was recently admitted early part of December with intra-abdominal abscess has drain placed by IR. 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  Clinical Impression  Pt awake in bed, safety mittens in place, pt asking about water repeatedly, quite thirsty. Pt assisted with PO ice water several times in session, drinks ~500-745mL during session. Pt alert, interactive, calm, participatory. Does well with bed mobility and transfer to EOB. Good seated balance. Tolerates a few minutes of standing, never able to DC posterior lean, but is able to Right sidestep at bedside with maxA for centering to bed. Pt fluctuates in ability to report dizziness, but appears generally stable. Pt returned to bed at EOB, sitting tall, watching TV as preferred, angled to visualize window view if desired. Pt expresses thanks for session.      Recommendations for follow up therapy are one component of a multi-disciplinary discharge planning process, led by the attending physician.  Recommendations may be updated based on patient status, additional functional criteria and insurance authorization.  Follow Up Recommendations Home health PT (wife prefers to meet pt's care needs in home) Can patient physically be transported by private vehicle: No    Assistance Recommended at Discharge Frequent or constant Supervision/Assistance  Patient can return home with the following  Two people to help with walking and/or transfers;A lot of help with bathing/dressing/bathroom;Direct supervision/assist for medications management    Equipment Recommendations None recommended by PT  Recommendations for Other Services        Functional Status Assessment Patient has had a recent decline in their functional status and demonstrates the ability to make significant improvements in function in a reasonable and predictable amount of time.     Precautions / Restrictions Precautions Precautions: Fall Precaution Comments: foley, JP drain Restrictions Weight Bearing Restrictions: No      Mobility  Bed Mobility Overal bed mobility: Needs Assistance Bed Mobility: Supine to Sit, Sit to Supine     Supine to sit: Min assist (trunk weakness, pulls self forwqard with RUE, also needs support of trunk) Sit to supine: Min guard   General bed mobility comments: author prepping bed and watching lines/leads for safety. Foley not clipped to thigh correctly, remedied by author prior to mobilization.    Transfers Overall transfer level: Needs assistance Equipment used: Rolling walker (2 wheels) Transfers: Sit to/from Stand Sit to Stand: Min assist (no assist to stand, only constant minA due to inabilty to DC posterior lean)           General transfer comment: does farly well with RW, tactile cues for hand placement, stands for ~2 minutes with concerted efforts to reestablish balance and DC posterior lean.    Ambulation/Gait Ambulation/Gait assistance:  (deferred due to limited safety awareness, inability achieve balance)                Stairs            Wheelchair Mobility    Modified Rankin (Stroke Patients Only)       Balance  Pertinent Vitals/Pain Pain Assessment Pain Assessment: No/denies pain    Home Living Family/patient expects to be discharged to:: Private residence Living Arrangements: Spouse/significant other Available Help at Discharge: Family Type of Home: House Home Access: Ramped entrance       Home Layout: One level Home Equipment: Wheelchair - power;Insurance risk surveyor (2 wheels) Additional  Comments: Months ago AMB short distances only in home; over past several weeks pt has been transfers only due to repeat illness/hospitalization    Prior Function                 ADLs Comments: Wife assist with all ADLs/IADLs     Hand Dominance   Dominant Hand: Right    Extremity/Trunk Assessment                Communication      Cognition Arousal/Alertness: Awake/alert Behavior During Therapy: WFL for tasks assessed/performed                                            General Comments      Exercises Other Exercises Other Exercises: lateral side stepping toward HOB, maxA to move RW, maxA weightshift prior to successful stepping; balance is never achieved during activity   Assessment/Plan    PT Assessment Patient needs continued PT services  PT Problem List Decreased strength;Decreased coordination;Cardiopulmonary status limiting activity;Decreased range of motion;Decreased cognition;Decreased activity tolerance;Decreased knowledge of use of DME;Decreased balance;Decreased safety awareness;Decreased mobility       PT Treatment Interventions DME instruction;Therapeutic exercise;Gait training;Balance training;Stair training;Neuromuscular re-education;Functional mobility training;Patient/family education;Therapeutic activities;Cognitive remediation;Wheelchair mobility training    PT Goals (Current goals can be found in the Care Plan section)  Acute Rehab PT Goals Patient Stated Goal: wants to get stronger PT Goal Formulation: With patient Time For Goal Achievement: 08/22/22 Potential to Achieve Goals: Good    Frequency Min 2X/week     Co-evaluation               AM-PAC PT "6 Clicks" Mobility  Outcome Measure Help needed turning from your back to your side while in a flat bed without using bedrails?: A Lot Help needed moving from lying on your back to sitting on the side of a flat bed without using bedrails?: A Lot Help needed moving  to and from a bed to a chair (including a wheelchair)?: A Lot Help needed standing up from a chair using your arms (e.g., wheelchair or bedside chair)?: A Lot Help needed to walk in hospital room?: Total Help needed climbing 3-5 steps with a railing? : Total 6 Click Score: 10    End of Session   Activity Tolerance: Patient tolerated treatment well;No increased pain Patient left: in bed;with bed alarm set;with call bell/phone within reach (bed in recline rposition, heels elevated, knees bent, mittens donned) Nurse Communication: Mobility status (foley anchor fail) PT Visit Diagnosis: Other abnormalities of gait and mobility (R26.89);Unsteadiness on feet (R26.81)    Time: 1594-5859 PT Time Calculation (min) (ACUTE ONLY): 31 min   Charges:     PT Treatments $Therapeutic Exercise: 8-22 mins       10:28 AM, 08/08/22 Rosamaria Lints, PT, DPT Physical Therapist - [facility]  [phone] (ASCOM)    Miley Blanchett C 08/08/2022, 10:28 AM

## 2022-08-08 NOTE — Assessment & Plan Note (Signed)
Mental status has improved from presentation.

## 2022-08-08 NOTE — Progress Notes (Signed)
Date of Admission:  08/06/2022   T   ID: Carl Figueroa is a 76 y.o. male  Principal Problem:   AKI (acute kidney injury) (HCC) Active Problems:   Essential hypertension   Anxiety and depression   Dyslipidemia   Dementia without behavioral disturbance (HCC)    Subjective:  Says he is feeling better  Medications:   Chlorhexidine Gluconate Cloth  6 each Topical Daily   enoxaparin (LOVENOX) injection  40 mg Subcutaneous Q24H   feeding supplement  237 mL Oral TID BM   sertraline  25 mg Oral Daily    Objective: Vital signs in last 24 hours: Patient Vitals for the past 24 hrs:  BP Temp Pulse Resp SpO2  08/08/22 0736 (!) 121/56 (!) 97.2 F (36.2 C) 64 17 100 %  08/07/22 1857 (!) 108/49 (!) 97.5 F (36.4 C) (!) 104 18 --      PHYSICAL EXAM:  General: alert, awake, less confused Neck: Supple, symmetrical, no adenopathy, thyroid: non tender no carotid bruit and no JVD. Lungs: Clear to auscultation bilaterally. No Wheezing or Rhonchi. No rales. Heart: Regular rate and rhythm, no murmur, rub or gallop. Abdomen: Soft, ileostomy Drain Rt side of abdomen  Bowel sounds normal. No masses Extremities: atraumatic, no cyanosis. No edema. No clubbing Skin: No rashes or lesions. Or bruising Lymph: Cervical, supraclavicular normal. Neurologic: Grossly non-focal  Lab Results Recent Labs    08/06/22 1909 08/07/22 0642 08/07/22 1547  WBC 10.6* 7.0  --   HGB 8.7* 9.1*  --   HCT 28.9* 30.1*  --   NA 143 145 146*  K 4.4 5.4* 4.9  CL 113* 121* 117*  CO2 25 21* 24  BUN 35* 37* 38*  CREATININE 1.57* 1.31* 1.44*   Liver Panel Recent Labs    08/06/22 1909  PROT 6.8  ALBUMIN 2.7*  AST 17  ALT 16  ALKPHOS 67  BILITOT 0.5   Microbiology: Bc NG UC pending Studies/Results: CT Angio Chest PE W/Cm &/Or Wo Cm  Result Date: 08/06/2022 CLINICAL DATA:  Sepsis Abdominal pain, acute, nonlocalized; Pulmonary embolism (PE) suspected, high prob EXAM: CT ANGIOGRAPHY CHEST  CT ABDOMEN AND PELVIS WITH CONTRAST TECHNIQUE: Multidetector CT imaging of the chest was performed using the standard protocol during bolus administration of intravenous contrast. Multiplanar CT image reconstructions and MIPs were obtained to evaluate the vascular anatomy. Multidetector CT imaging of the abdomen and pelvis was performed using the standard protocol during bolus administration of intravenous contrast. RADIATION DOSE REDUCTION: This exam was performed according to the departmental dose-optimization program which includes automated exposure control, adjustment of the mA and/or kV according to patient size and/or use of iterative reconstruction technique. CONTRAST:  68mL OMNIPAQUE IOHEXOL 350 MG/ML SOLN COMPARISON:  None Available. FINDINGS: CTA CHEST FINDINGS Cardiovascular: Satisfactory opacification of the pulmonary arteries to the segmental level. No evidence of pulmonary embolism. Normal heart size. No significant pericardial effusion. The thoracic aorta is normal in caliber. Mild atherosclerotic plaque of the thoracic aorta. Left anterior descending coronary artery calcifications. Mediastinum/Nodes: No enlarged mediastinal, hilar, or axillary lymph nodes. Thyroid gland, trachea, and esophagus demonstrate no significant findings. Lungs/Pleura: Elevated left hemidiaphragm. Left lower lobe linear atelectasis versus scarring. No focal consolidation. No pulmonary nodule. No pulmonary mass. No pleural effusion. No pneumothorax. Musculoskeletal: Bilateral trace gynecomastia.  No chest wall abnormality. No suspicious lytic or blastic osseous lesions. No acute displaced fracture. Multilevel degenerative changes of the spine. Review of the MIP images confirms the above findings. CT ABDOMEN  and PELVIS FINDINGS Hepatobiliary: No focal liver abnormality. No gallstones, gallbladder wall thickening, or pericholecystic fluid. No biliary dilatation. Pancreas: Diffusely atrophic. No focal lesion. Otherwise normal  pancreatic contour. No surrounding inflammatory changes. No main pancreatic ductal dilatation. Spleen: Normal in size without focal abnormality. Adrenals/Urinary Tract: No adrenal nodule bilaterally. Bilateral kidneys enhance symmetrically. Fluid density lesion within the left kidney likely represents a simple renal cyst. Simple renal cysts, in the absence of clinically indicated signs/symptoms, require no independent follow-up. No hydronephrosis. No hydroureter. The urinary bladder is not well visualized due to streak artifact originating from the left femoral surgical hardware. Foley catheter noted within the urinary bladder lumen with complete decompression of the urinary bladder. 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 Right lower quadrant ostomy formation. Majority of the bowel is decompressed. Stomach is within normal limits. No evidence of bowel wall thickening or dilatation. Limited evaluation of the active rectum due to streak artifact originating from the left femoral surgical hardware. Appendix appears normal. Vascular/Lymphatic: No abdominal aorta or iliac aneurysm. Mild atherosclerotic plaque of the aorta and its branches. No abdominal, pelvic, or inguinal lymphadenopathy. Reproductive: Not visualized due to streak artifact from the left femoral surgical hardware. Other: Right lower abdominal surgical drain with pigtail within the right mid abdomen just inferior to the liver and gallbladder. No intraperitoneal free fluid. No intraperitoneal free gas. No organized fluid collection. Musculoskeletal: No abdominal wall hernia or abnormality. No suspicious lytic or blastic osseous lesions. No acute displaced fracture. Grade 1 anterolisthesis of L4 on L5 and L5 on S1. Multilevel severe degenerative changes of the spine. Total left hip arthroplasty. At least moderate degenerative changes of the right hip. Review of the MIP images confirms the above findings. IMPRESSION: 1. No  pulmonary embolus. 2. No acute intrathoracic abnormality. 3. No acute intra-abdominal or intrapelvic abnormality in a patient with colectomy and right lower quadrant end ostomy. Limited evaluation due to lack of intraperitoneal fat, decompressed bowel, and streak artifact from left femoral surgical hardware. 4. Right lower abdominal surgical drain with pigtail within the right mid abdomen just inferior to the liver and gallbladder. Electronically Signed   By: Tish Frederickson M.D.   On: 08/06/2022 21:03   CT Abdomen Pelvis W Contrast  Result Date: 08/06/2022 CLINICAL DATA:  Sepsis Abdominal pain, acute, nonlocalized; Pulmonary embolism (PE) suspected, high prob EXAM: CT ANGIOGRAPHY CHEST CT ABDOMEN AND PELVIS WITH CONTRAST TECHNIQUE: Multidetector CT imaging of the chest was performed using the standard protocol during bolus administration of intravenous contrast. Multiplanar CT image reconstructions and MIPs were obtained to evaluate the vascular anatomy. Multidetector CT imaging of the abdomen and pelvis was performed using the standard protocol during bolus administration of intravenous contrast. RADIATION DOSE REDUCTION: This exam was performed according to the departmental dose-optimization program which includes automated exposure control, adjustment of the mA and/or kV according to patient size and/or use of iterative reconstruction technique. CONTRAST:  64mL OMNIPAQUE IOHEXOL 350 MG/ML SOLN COMPARISON:  None Available. FINDINGS: CTA CHEST FINDINGS Cardiovascular: Satisfactory opacification of the pulmonary arteries to the segmental level. No evidence of pulmonary embolism. Normal heart size. No significant pericardial effusion. The thoracic aorta is normal in caliber. Mild atherosclerotic plaque of the thoracic aorta. Left anterior descending coronary artery calcifications. Mediastinum/Nodes: No enlarged mediastinal, hilar, or axillary lymph nodes. Thyroid gland, trachea, and esophagus demonstrate no  significant findings. Lungs/Pleura: Elevated left hemidiaphragm. Left lower lobe linear atelectasis versus scarring. No focal consolidation. No pulmonary nodule. No pulmonary mass. No pleural effusion.  No pneumothorax. Musculoskeletal: Bilateral trace gynecomastia.  No chest wall abnormality. No suspicious lytic or blastic osseous lesions. No acute displaced fracture. Multilevel degenerative changes of the spine. Review of the MIP images confirms the above findings. CT ABDOMEN and PELVIS FINDINGS Hepatobiliary: No focal liver abnormality. No gallstones, gallbladder wall thickening, or pericholecystic fluid. No biliary dilatation. Pancreas: Diffusely atrophic. No focal lesion. Otherwise normal pancreatic contour. No surrounding inflammatory changes. No main pancreatic ductal dilatation. Spleen: Normal in size without focal abnormality. Adrenals/Urinary Tract: No adrenal nodule bilaterally. Bilateral kidneys enhance symmetrically. Fluid density lesion within the left kidney likely represents a simple renal cyst. Simple renal cysts, in the absence of clinically indicated signs/symptoms, require no independent follow-up. No hydronephrosis. No hydroureter. The urinary bladder is not well visualized due to streak artifact originating from the left femoral surgical hardware. Foley catheter noted within the urinary bladder lumen with complete decompression of the urinary bladder. 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 Right lower quadrant ostomy formation. Majority of the bowel is decompressed. Stomach is within normal limits. No evidence of bowel wall thickening or dilatation. Limited evaluation of the active rectum due to streak artifact originating from the left femoral surgical hardware. Appendix appears normal. Vascular/Lymphatic: No abdominal aorta or iliac aneurysm. Mild atherosclerotic plaque of the aorta and its branches. No abdominal, pelvic, or inguinal lymphadenopathy. Reproductive:  Not visualized due to streak artifact from the left femoral surgical hardware. Other: Right lower abdominal surgical drain with pigtail within the right mid abdomen just inferior to the liver and gallbladder. No intraperitoneal free fluid. No intraperitoneal free gas. No organized fluid collection. Musculoskeletal: No abdominal wall hernia or abnormality. No suspicious lytic or blastic osseous lesions. No acute displaced fracture. Grade 1 anterolisthesis of L4 on L5 and L5 on S1. Multilevel severe degenerative changes of the spine. Total left hip arthroplasty. At least moderate degenerative changes of the right hip. Review of the MIP images confirms the above findings. IMPRESSION: 1. No pulmonary embolus. 2. No acute intrathoracic abnormality. 3. No acute intra-abdominal or intrapelvic abnormality in a patient with colectomy and right lower quadrant end ostomy. Limited evaluation due to lack of intraperitoneal fat, decompressed bowel, and streak artifact from left femoral surgical hardware. 4. Right lower abdominal surgical drain with pigtail within the right mid abdomen just inferior to the liver and gallbladder. Electronically Signed   By: Tish Frederickson M.D.   On: 08/06/2022 21:03   CT Head Wo Contrast  Result Date: 08/06/2022 CLINICAL DATA:  Mental status change, unknown cause EXAM: CT HEAD WITHOUT CONTRAST TECHNIQUE: Contiguous axial images were obtained from the base of the skull through the vertex without intravenous contrast. RADIATION DOSE REDUCTION: This exam was performed according to the departmental dose-optimization program which includes automated exposure control, adjustment of the mA and/or kV according to patient size and/or use of iterative reconstruction technique. COMPARISON:  CT head 01/11/2022 FINDINGS: Brain: Cerebral ventricle sizes are concordant with the degree of cerebral volume loss. Patchy and confluent areas of decreased attenuation are noted throughout the deep and  periventricular white matter of the cerebral hemispheres bilaterally, compatible with chronic microvascular ischemic disease. No evidence of large-territorial acute infarction. No parenchymal hemorrhage. No mass lesion. No extra-axial collection. No mass effect or midline shift. No hydrocephalus. Basilar cisterns are patent. Vascular: No hyperdense vessel. Skull: No acute fracture or focal lesion. Sinuses/Orbits: Bilateral maxillary sinus mucosal thickening. Otherwise paranasal sinuses and mastoid air cells are clear. Right lens replacement. Otherwise the orbits are unremarkable. Other:  None. IMPRESSION: No acute intracranial abnormality. Electronically Signed   By: Tish FredericksonMorgane  Naveau M.D.   On: 08/06/2022 20:52   DG Chest Port 1 View  Result Date: 08/06/2022 CLINICAL DATA:  Shortness of breath EXAM: PORTABLE CHEST 1 VIEW COMPARISON:  07/21/2022 FINDINGS: Scarring or atelectasis left base. No acute airspace disease. Stable normal cardiomediastinal silhouette with aortic atherosclerosis. No pneumothorax IMPRESSION: No active disease. Scarring or atelectasis at the left base. Electronically Signed   By: Jasmine PangKim  Fujinaga M.D.   On: 08/06/2022 19:23     Assessment/Plan: Altered mental status / metabolic encephalopathy with underlying Lewy body dementia- does not look septic- no fever, no leucocytosis, no increase in lactate, stable vitals He is doing better today less confused D.D - poor intake dehydration  ?medication ( was on linezolid and SSRI- but linezolid stopped 72 hrs ago ? Infection- Urine ? 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

## 2022-08-08 NOTE — Hospital Course (Signed)
76 y.o. male with medical history significant for osteoarthritis, anxiety, hypertension and Lewy body dementia, presented to the emergency room with acute onset of altered mental status with confusion over the last couple of days with diminished p.o. intake.  No fever or chills.  He was admitted here on 12 /2/23 and discharged on 12/ 8/23 after being managed for VRE bacteremia and intra-abdominal abscess for which she had a drain as well as AKI. Per wife patient completed a couise of PO zyvox.  Repeat CT scan did not show any further abscesses in the abdomen. Spoke with interventional radiology and abdominal drain removed today 08/08/22. Will also do a voiding trial and remove Foley today on 08/08/2022.  12/21.  Patient doing well and wanted to go home.  Home health resumed.

## 2022-08-08 NOTE — Plan of Care (Signed)
  Problem: Fluid Volume: Goal: Hemodynamic stability will improve Outcome: Progressing   Problem: Clinical Measurements: Goal: Diagnostic test results will improve Outcome: Progressing Goal: Signs and symptoms of infection will decrease Outcome: Progressing   Problem: Respiratory: Goal: Ability to maintain adequate ventilation will improve Outcome: Progressing   Problem: Education: Goal: Knowledge of disease or condition will improve Outcome: Progressing Goal: Knowledge of secondary prevention will improve (MUST DOCUMENT ALL) Outcome: Progressing Goal: Knowledge of patient specific risk factors will improve (Mark N/A or DELETE if not current risk factor) Outcome: Progressing   Problem: Ischemic Stroke/TIA Tissue Perfusion: Goal: Complications of ischemic stroke/TIA will be minimized Outcome: Progressing   Problem: Coping: Goal: Will verbalize positive feelings about self Outcome: Progressing Goal: Will identify appropriate support needs Outcome: Progressing   Problem: Health Behavior/Discharge Planning: Goal: Ability to manage health-related needs will improve Outcome: Progressing Goal: Goals will be collaboratively established with patient/family Outcome: Progressing   Problem: Self-Care: Goal: Ability to participate in self-care as condition permits will improve Outcome: Progressing Goal: Verbalization of feelings and concerns over difficulty with self-care will improve Outcome: Progressing Goal: Ability to communicate needs accurately will improve Outcome: Progressing   Problem: Nutrition: Goal: Risk of aspiration will decrease Outcome: Progressing Goal: Dietary intake will improve Outcome: Progressing   Problem: Education: Goal: Knowledge of General Education information will improve Description: Including pain rating scale, medication(s)/side effects and non-pharmacologic comfort measures Outcome: Progressing   Problem: Health Behavior/Discharge  Planning: Goal: Ability to manage health-related needs will improve Outcome: Progressing   Problem: Clinical Measurements: Goal: Ability to maintain clinical measurements within normal limits will improve Outcome: Progressing Goal: Will remain free from infection Outcome: Progressing Goal: Diagnostic test results will improve Outcome: Progressing Goal: Respiratory complications will improve Outcome: Progressing Goal: Cardiovascular complication will be avoided Outcome: Progressing   Problem: Activity: Goal: Risk for activity intolerance will decrease Outcome: Progressing   Problem: Nutrition: Goal: Adequate nutrition will be maintained Outcome: Progressing   Problem: Coping: Goal: Level of anxiety will decrease Outcome: Progressing   Problem: Elimination: Goal: Will not experience complications related to bowel motility Outcome: Progressing Goal: Will not experience complications related to urinary retention Outcome: Progressing   Problem: Pain Managment: Goal: General experience of comfort will improve Outcome: Progressing   Problem: Safety: Goal: Ability to remain free from injury will improve Outcome: Progressing   Problem: Skin Integrity: Goal: Risk for impaired skin integrity will decrease Outcome: Progressing   

## 2022-08-08 NOTE — Assessment & Plan Note (Signed)
With urinary retention.  Discontinued Foley catheter on 08/08/2022.  Patient passed voiding trial.  Continue Flomax and Proscar.

## 2022-08-08 NOTE — TOC Initial Note (Signed)
Transition of Care [facility]) - Initial/Assessment Note    Patient Details  Name: Carl Figueroa MRN: [id number] Date of Birth: [birth year]  Transition of Care [facility]) CM/SW Contact:    Carl Rua, LCSW Phone Number: 08/08/2022, 3:56 PM  Clinical Narrative:                  Patient is active with [facility] for PT OT and RN, will need resumption orders at time of discharge.   TOC will follow for additional needs.   Planned Disposition: Home with Health Care Svc Barriers to Discharge: Continued Medical Work up   Patient Goals and CMS Choice Patient states their goals for this hospitalization and ongoing recovery are:: to go home CMS Medicare.gov Compare Post Acute Care list provided to:: Patient Represenative (must comment) (wife) Choice offered to / list presented to : Spouse      Expected Discharge Plan and Services Planned Disposition: Home with Health Care Svc                                     [facility] Agency: [facility] Date [facility] Agency Contacted: 08/08/22 Time HH Agency Contacted: 1556 Representative spoke with at [facility] Agency: Barbara Cower  Prior Living Arrangements/Services                       Activities of Daily Living Home Assistive Devices/Equipment: Wheelchair, Nurse, adult, Environmental consultant (specify type) ADL Screening (condition at time of admission) Patient's cognitive ability adequate to safely complete daily activities?: No Is the patient deaf or have difficulty hearing?: No Does the patient have difficulty seeing, even when wearing glasses/contacts?: No Does the patient have difficulty concentrating, remembering, or making decisions?: Yes Patient able to express need for assistance with ADLs?: Yes Does the patient have difficulty dressing or bathing?: Yes Independently performs ADLs?: No Communication: Independent Dressing (OT): Dependent Is this a change from baseline?: Pre-admission baseline Grooming: Dependent Is  this a change from baseline?: Pre-admission baseline Feeding: Needs assistance Is this a change from baseline?: Pre-admission baseline Bathing: Dependent Is this a change from baseline?: Pre-admission baseline Toileting: Dependent Is this a change from baseline?: Pre-admission baseline In/Out Bed: Dependent Is this a change from baseline?: Pre-admission baseline Walks in Home: Dependent Is this a change from baseline?: Pre-admission baseline Does the patient have difficulty walking or climbing stairs?: Yes Weakness of Legs: Both Weakness of Arms/Hands: Both  Permission Sought/Granted                  Emotional Assessment              Admission diagnosis:  AKI (acute kidney injury) (HCC) [N17.9] Altered mental status, unspecified altered mental status type [R41.82] Patient Active Problem List   Diagnosis Date Noted   Pressure injury of buttock, stage 2 (HCC) 08/08/2022   Dementia without behavioral disturbance (HCC) 08/07/2022   Anemia of chronic disease 07/25/2022   VRE bacteremia 07/24/2022   Protein-calorie malnutrition, severe 07/24/2022   Intra-abdominal abscess (HCC) 07/24/2022   AKI (acute kidney injury) (HCC) 07/22/2022   Dyslipidemia 07/22/2022   Dementia, senile with depression (HCC) 07/22/2022   BPH (benign prostatic hyperplasia) 07/22/2022   Sacral decubitus ulcer, stage II (HCC) 07/22/2022   Septic embolism (HCC) 07/16/2022   Pressure injury of skin 07/16/2022   Severe sepsis (HCC) 07/07/2022   Foley catheter in place on admission 07/07/2022  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   Left lower lobe pneumonia 07/07/2022   S/P laparotomy 07/07/2022   Staring episodes 07/07/2022   Long term (current) use of antibiotics 07/07/2022   Hypotension 07/07/2022   Hyperkalemia 07/07/2022   Social discord 01/12/2022   Chronic post-traumatic stress disorder 08/03/2021   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/2021   Impotence 08/03/2021   Primary open angle glaucoma, right eye  08/03/2021   Tremor 08/03/2021   Memory loss 08/03/2021   Gait abnormality 08/03/2021   Status post total hip replacement, 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   Demand ischemia 07/30/2019   Anxiety and depression 07/30/2019   Acute metabolic encephalopathy 07/08/2019   Hematuria 07/08/2019   Thrombocytopenia (HCC) 07/08/2019   Elevated troponin 07/08/2019   Sepsis (HCC) 07/07/2019   Essential hypertension 07/07/2019   Acute renal failure superimposed on stage 3a chronic kidney disease (HCC) 07/07/2019   PCP:  Mick Sell, MD Pharmacy:   [facility] [phone] [place], [place] - [address] [place] [address] Phone: [phone] Fax: [phone]  [facility] [id number] [place], [place] - [address] [place] [address] Phone: [phone] Fax: [phone]  [facility] - [place], [place] - [address] [place] [address] Phone: [phone] Fax: [phone]     Social Determinants of Health (SDOH) Social History: SDOH Screenings   Food Insecurity: No Food Insecurity (07/23/2022)  Housing: Low Risk  (07/23/2022)  Transportation Needs: No Transportation Needs (07/23/2022)  Utilities: Not At Risk (07/23/2022)  Tobacco Use: Medium Risk (08/08/2022)   SDOH Interventions:     Readmission Risk Interventions     No data to display

## 2022-08-08 NOTE — Progress Notes (Signed)
Interventional Radiology Progress Note   76 yo male with history of drain placed for non-specific abdominal fluid on 12/6.   The fluid culture returned sterile.     Patient has had follow up CT that shows the fluid is resolved.   Drain was removed at the bedside.  Dry dressing changes as needed until the entry site is closed with secondary intention.   Signed,  Yvone Neu. Loreta Ave, DO

## 2022-08-09 DIAGNOSIS — F0390 Unspecified dementia without behavioral disturbance: Secondary | ICD-10-CM | POA: Diagnosis not present

## 2022-08-09 DIAGNOSIS — G9341 Metabolic encephalopathy: Secondary | ICD-10-CM | POA: Diagnosis not present

## 2022-08-09 DIAGNOSIS — L89302 Pressure ulcer of unspecified buttock, stage 2: Secondary | ICD-10-CM | POA: Diagnosis not present

## 2022-08-09 DIAGNOSIS — N179 Acute kidney failure, unspecified: Secondary | ICD-10-CM | POA: Diagnosis not present

## 2022-08-09 LAB — BASIC METABOLIC PANEL
Anion gap: 0 — ABNORMAL LOW (ref 5–15)
BUN: 29 mg/dL — ABNORMAL HIGH (ref 8–23)
CO2: 27 mmol/L (ref 22–32)
Calcium: 9.4 mg/dL (ref 8.9–10.3)
Chloride: 118 mmol/L — ABNORMAL HIGH (ref 98–111)
Creatinine, Ser: 1.1 mg/dL (ref 0.61–1.24)
GFR, Estimated: >60 mL/min (ref >60)
Glucose, Bld: 93 mg/dL (ref 70–99)
Potassium: 4.5 mmol/L (ref 3.5–5.1)
Sodium: 145 mmol/L (ref 135–145)

## 2022-08-09 LAB — HEMOGLOBIN: Hemoglobin: 7.5 g/dL — ABNORMAL LOW (ref 13.0–17.0)

## 2022-08-09 MED ORDER — TRAZODONE HCL 50 MG PO TABS: 50.0000 mg | ORAL_TABLET | Freq: Every day | ORAL | 0 refills | Status: DC

## 2022-08-09 MED ORDER — TAMSULOSIN HCL 0.4 MG PO CAPS: 0.4000 mg | ORAL_CAPSULE | Freq: Every day | ORAL | 0 refills | Status: DC

## 2022-08-09 MED ORDER — FINASTERIDE 5 MG PO TABS: 5.0000 mg | ORAL_TABLET | Freq: Every day | ORAL | 0 refills | Status: DC

## 2022-08-09 NOTE — Assessment & Plan Note (Signed)
With IV fluid hydration the hemoglobin dipped down to 7.5.  This is similar to the 7.9 he was discharged home recently on.  Continue to monitor as outpatient.  Refer to hematology.

## 2022-08-09 NOTE — Consult Note (Signed)
WOC Nurse ostomy consult note Patient and family request a 2-piece convex pouching system. They are using a 1-piece convex pouching system as that is what we carry in-house. I will ask our ostomy manufacturer to send a few samples of the 2-piece convex pouching sytem to the patient's home ([employer]).  Patient is expected to discharge today.  WOC nursing team will not follow, but will remain available to this patient, the nursing and medical teams.  Please re-consult if needed.  Thank you for inviting [place] to participate in this patient's Plan of Care.  Ladona Mow, MSN, RN, CNS, GNP, Leda Min, [employer], [employer] phone:  [phone]

## 2022-08-09 NOTE — TOC Transition Note (Signed)
Transition of Care [facility]) - CM/SW Discharge Note   Patient Details  Name: Taje Littler MRN: [id number] Date of Birth: [birth year]  Transition [facility] Care [facility]) CM/SW Contact:  Colin Broach, LCSW Phone Number: 08/09/2022, 10:56 AM   Clinical Narrative:    Patient discharging home with home health services provided by [facility].  Patient will transport home via personal vehicle.Discharge summary in chart.   Final next level of care: Home w Home Health Services Barriers to Discharge: No Barriers Identified   Patient Goals and CMS Choice Patient states their goals for this hospitalization and ongoing recovery are:: home CMS Medicare.gov Compare Post Acute Care list provided to:: Patient Represenative (must comment) (wife) Choice offered to / list presented to : Spouse    Discharge Placement                       Discharge Plan and Services                          HH Arranged: RN, PT, OT [facility] Agency: Other - See comment ([facility]) Date [facility] Agency Contacted: 08/08/22 Time HH Agency Contacted: 1556 Representative spoke with at [facility] Agency: Barbara Cower  Social Determinants of Health (SDOH) Interventions     Readmission Risk Interventions     No data to display

## 2022-08-09 NOTE — Discharge Summary (Addendum)
Physician Discharge Summary   Patient: Carl Figueroa MRN: [id number] DOB: September 02, 1945  Admit date:     08/06/2022  Discharge date: 08/09/22  Discharge Physician: Alford Highland   PCP: Mick Sell, MD   Recommendations at discharge:   Follow-up PCP 5 days Will refer to hematology as outpatient for anemia of chronic disease  Discharge Diagnoses: Principal Problem:   AKI (acute kidney injury) (HCC) Active Problems:   Acute metabolic encephalopathy   Essential hypertension   Anxiety and depression   Dyslipidemia   BPH (benign prostatic hyperplasia)   Anemia of chronic disease   Dementia without behavioral disturbance (HCC)   Pressure injury of buttock, stage 2 (HCC)   Altered mental status    Hospital Course: 76 y.o. male with medical history significant for osteoarthritis, anxiety, hypertension and Lewy body dementia, presented to the emergency room with acute onset of altered mental status with confusion over the last couple of days with diminished p.o. intake.  No fever or chills.  He was admitted here on 12 /2/23 and discharged on 12/ 8/23 after being managed for VRE bacteremia and intra-abdominal abscess for which she had a drain as well as AKI. Per wife patient completed a couise of PO zyvox.  Repeat CT scan did not show any further abscesses in the abdomen. Spoke with interventional radiology and abdominal drain removed today 08/08/22. Will also do a voiding trial and remove Foley today on 08/08/2022.  12/21.  Patient doing well and wanted to go home.  Home health resumed.   Assessment and Plan: * AKI (acute kidney injury) (HCC) Creatinine 1.57 on presentation and baseline around 1.19.  Today's creatinine 1.1.  Continue to hold Lasix and lisinopril.  Acute metabolic encephalopathy Mental status has improved from presentation.  Pressure injury of buttock, stage 2 (HCC) Present on admission, see full description below.  Dementia without behavioral  disturbance (HCC) - We will continue Aricept and Exelon patch.  Anemia of chronic disease With IV fluid hydration the hemoglobin dipped down to 7.5.  This is similar to the 7.9 he was discharged home recently on.  Continue to monitor as outpatient.  Refer to hematology.  BPH (benign prostatic hyperplasia) With urinary retention.  Discontinued Foley catheter on 08/08/2022.  Patient passed voiding trial.  Continue Flomax and Proscar.  Dyslipidemia - Continue statin therapy.  Anxiety and depression - We will continue his Klonopin as well as Effexor XR and Zoloft.  Essential hypertension hold off his lisinopril given acute kidney injury.         Consultants: Interventional radiology, infectious disease Procedures performed: Abdominal drain removal by interventional radiology, Foley removal Disposition: Home health Diet recommendation:  Cardiac diet DISCHARGE MEDICATION: Allergies as of 08/09/2022       Reactions   Morphine Nausea Only        Medication List     STOP taking these medications    furosemide 40 MG tablet Commonly known as: LASIX   linezolid 600 MG tablet Commonly known as: ZYVOX   lisinopril 40 MG tablet Commonly known as: ZESTRIL   potassium chloride 10 MEQ tablet Commonly known as: KLOR-CON   sodium chloride irrigation 0.9 % irrigation       TAKE these medications    ascorbic acid 500 MG tablet Commonly known as: VITAMIN C Take 1 tablet (500 mg total) by mouth 2 (two) times daily.   feeding supplement Liqd Take 237 mLs by mouth 3 (three) times daily between meals.   finasteride 5 MG  tablet Commonly known as: PROSCAR Take 1 tablet (5 mg total) by mouth daily.   multivitamin with minerals Tabs tablet Take 1 tablet by mouth daily.   sertraline 25 MG tablet Commonly known as: ZOLOFT Take 25 mg by mouth daily.   tamsulosin 0.4 MG Caps capsule Commonly known as: FLOMAX Take 1 capsule (0.4 mg total) by mouth daily. What changed:  when to take this   traZODone 50 MG tablet Commonly known as: DESYREL Take 1 tablet (50 mg total) by mouth at bedtime.   zinc sulfate 220 (50 Zn) MG capsule Take 1 capsule (220 mg total) by mouth daily.        Follow-up Information     Mick Sell, MD Follow up on 08/15/2022.   Specialty: Infectious Diseases Why: at 11am Contact information: [address] [place] [address] [phone]         Creig Hines, MD Follow up on 08/28/2022.   Specialty: Oncology Why: anemia of chronic disease at [address] Contact information: [address] [place] [address] [phone]                Discharge Exam: Ceasar Mons Weights   08/07/22 0020 08/07/22 0420  Weight: 58.1 kg 58.1 kg   Physical Exam HENT:     Head: Normocephalic.     Mouth/Throat:     Pharynx: No oropharyngeal exudate.  Eyes:     General: Lids are normal.     Conjunctiva/sclera: Conjunctivae normal.  Cardiovascular:     Rate and Rhythm: Normal rate and regular rhythm.     Heart sounds: Normal heart sounds, S1 normal and S2 normal.  Pulmonary:     Breath sounds: No decreased breath sounds, wheezing, rhonchi or rales.  Abdominal:     Palpations: Abdomen is soft.     Tenderness: There is no abdominal tenderness.  Musculoskeletal:     Right lower leg: No swelling.     Left lower leg: No swelling.  Skin:    General: Skin is warm.     Findings: No rash.  Neurological:     Mental Status: He is alert.     Comments: Able to follow some simple commands.      Condition at discharge: stable  The results of significant diagnostics from this hospitalization (including imaging, microbiology, ancillary and laboratory) are listed below for reference.   Imaging Studies: IR Radiologist Eval & Mgmt  Result Date: 08/08/2022 EXAM: ESTABLISHED PATIENT CONSULTATION CHIEF COMPLAINT: Indwelling drain HISTORY OF PRESENT ILLNESS: Progress note in the electronic medical record REVIEW OF SYSTEMS:  Electronic record PHYSICAL EXAMINATION: Electronic record ASSESSMENT AND PLAN: Drain was removed given resolution of fluid and sterility of fluid Electronically Signed   By: Gilmer Mor D.O.   On: 08/08/2022 12:41   CT Angio Chest PE W/Cm &/Or Wo Cm  Result Date: 08/06/2022 CLINICAL DATA:  Sepsis Abdominal pain, acute, nonlocalized; Pulmonary embolism (PE) suspected, high prob EXAM: CT ANGIOGRAPHY CHEST CT ABDOMEN AND PELVIS WITH CONTRAST TECHNIQUE: Multidetector CT imaging of the chest was performed using the standard protocol during bolus administration of intravenous contrast. Multiplanar CT image reconstructions and MIPs were obtained to evaluate the vascular anatomy. Multidetector CT imaging of the abdomen and pelvis was performed using the standard protocol during bolus administration of intravenous contrast. RADIATION DOSE REDUCTION: This exam was performed according to the departmental dose-optimization program which includes automated exposure control, adjustment of the mA and/or kV according to patient size and/or use of iterative reconstruction  technique. CONTRAST:  80mL OMNIPAQUE IOHEXOL 350 MG/ML SOLN COMPARISON:  None Available. FINDINGS: CTA CHEST FINDINGS Cardiovascular: Satisfactory opacification of the pulmonary arteries to the segmental level. No evidence of pulmonary embolism. Normal heart size. No significant pericardial effusion. The thoracic aorta is normal in caliber. Mild atherosclerotic plaque of the thoracic aorta. Left anterior descending coronary artery calcifications. Mediastinum/Nodes: No enlarged mediastinal, hilar, or axillary lymph nodes. Thyroid gland, trachea, and esophagus demonstrate no significant findings. Lungs/Pleura: Elevated left hemidiaphragm. Left lower lobe linear atelectasis versus scarring. No focal consolidation. No pulmonary nodule. No pulmonary mass. No pleural effusion. No pneumothorax. Musculoskeletal: Bilateral trace gynecomastia.  No chest wall  abnormality. No suspicious lytic or blastic osseous lesions. No acute displaced fracture. Multilevel degenerative changes of the spine. Review of the MIP images confirms the above findings. CT ABDOMEN and PELVIS FINDINGS Hepatobiliary: No focal liver abnormality. No gallstones, gallbladder wall thickening, or pericholecystic fluid. No biliary dilatation. Pancreas: Diffusely atrophic. No focal lesion. Otherwise normal pancreatic contour. No surrounding inflammatory changes. No main pancreatic ductal dilatation. Spleen: Normal in size without focal abnormality. Adrenals/Urinary Tract: No adrenal nodule bilaterally. Bilateral kidneys enhance symmetrically. Fluid density lesion within the left kidney likely represents a simple renal cyst. Simple renal cysts, in the absence of clinically indicated signs/symptoms, require no independent follow-up. No hydronephrosis. No hydroureter. The urinary bladder is not well visualized due to streak artifact originating from the left femoral surgical hardware. Foley catheter noted within the urinary bladder lumen with complete decompression of the urinary bladder. 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 Right lower quadrant ostomy formation. Majority of the bowel is decompressed. Stomach is within normal limits. No evidence of bowel wall thickening or dilatation. Limited evaluation of the active rectum due to streak artifact originating from the left femoral surgical hardware. Appendix appears normal. Vascular/Lymphatic: No abdominal aorta or iliac aneurysm. Mild atherosclerotic plaque of the aorta and its branches. No abdominal, pelvic, or inguinal lymphadenopathy. Reproductive: Not visualized due to streak artifact from the left femoral surgical hardware. Other: Right lower abdominal surgical drain with pigtail within the right mid abdomen just inferior to the liver and gallbladder. No intraperitoneal free fluid. No intraperitoneal free gas. No organized  fluid collection. Musculoskeletal: No abdominal wall hernia or abnormality. No suspicious lytic or blastic osseous lesions. No acute displaced fracture. Grade 1 anterolisthesis of L4 on L5 and L5 on S1. Multilevel severe degenerative changes of the spine. Total left hip arthroplasty. At least moderate degenerative changes of the right hip. Review of the MIP images confirms the above findings. IMPRESSION: 1. No pulmonary embolus. 2. No acute intrathoracic abnormality. 3. No acute intra-abdominal or intrapelvic abnormality in a patient with colectomy and right lower quadrant end ostomy. Limited evaluation due to lack of intraperitoneal fat, decompressed bowel, and streak artifact from left femoral surgical hardware. 4. Right lower abdominal surgical drain with pigtail within the right mid abdomen just inferior to the liver and gallbladder. Electronically Signed   By: Tish Frederickson M.D.   On: 08/06/2022 21:03   CT Abdomen Pelvis W Contrast  Result Date: 08/06/2022 CLINICAL DATA:  Sepsis Abdominal pain, acute, nonlocalized; Pulmonary embolism (PE) suspected, high prob EXAM: CT ANGIOGRAPHY CHEST CT ABDOMEN AND PELVIS WITH CONTRAST TECHNIQUE: Multidetector CT imaging of the chest was performed using the standard protocol during bolus administration of intravenous contrast. Multiplanar CT image reconstructions and MIPs were obtained to evaluate the vascular anatomy. Multidetector CT imaging of the abdomen and pelvis was performed using the standard protocol during  bolus administration of intravenous contrast. RADIATION DOSE REDUCTION: This exam was performed according to the departmental dose-optimization program which includes automated exposure control, adjustment of the mA and/or kV according to patient size and/or use of iterative reconstruction technique. CONTRAST:  82mL OMNIPAQUE IOHEXOL 350 MG/ML SOLN COMPARISON:  None Available. FINDINGS: CTA CHEST FINDINGS Cardiovascular: Satisfactory opacification of the  pulmonary arteries to the segmental level. No evidence of pulmonary embolism. Normal heart size. No significant pericardial effusion. The thoracic aorta is normal in caliber. Mild atherosclerotic plaque of the thoracic aorta. Left anterior descending coronary artery calcifications. Mediastinum/Nodes: No enlarged mediastinal, hilar, or axillary lymph nodes. Thyroid gland, trachea, and esophagus demonstrate no significant findings. Lungs/Pleura: Elevated left hemidiaphragm. Left lower lobe linear atelectasis versus scarring. No focal consolidation. No pulmonary nodule. No pulmonary mass. No pleural effusion. No pneumothorax. Musculoskeletal: Bilateral trace gynecomastia.  No chest wall abnormality. No suspicious lytic or blastic osseous lesions. No acute displaced fracture. Multilevel degenerative changes of the spine. Review of the MIP images confirms the above findings. CT ABDOMEN and PELVIS FINDINGS Hepatobiliary: No focal liver abnormality. No gallstones, gallbladder wall thickening, or pericholecystic fluid. No biliary dilatation. Pancreas: Diffusely atrophic. No focal lesion. Otherwise normal pancreatic contour. No surrounding inflammatory changes. No main pancreatic ductal dilatation. Spleen: Normal in size without focal abnormality. Adrenals/Urinary Tract: No adrenal nodule bilaterally. Bilateral kidneys enhance symmetrically. Fluid density lesion within the left kidney likely represents a simple renal cyst. Simple renal cysts, in the absence of clinically indicated signs/symptoms, require no independent follow-up. No hydronephrosis. No hydroureter. The urinary bladder is not well visualized due to streak artifact originating from the left femoral surgical hardware. Foley catheter noted within the urinary bladder lumen with complete decompression of the urinary bladder. 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 Right lower quadrant ostomy formation. Majority of the bowel is  decompressed. Stomach is within normal limits. No evidence of bowel wall thickening or dilatation. Limited evaluation of the active rectum due to streak artifact originating from the left femoral surgical hardware. Appendix appears normal. Vascular/Lymphatic: No abdominal aorta or iliac aneurysm. Mild atherosclerotic plaque of the aorta and its branches. No abdominal, pelvic, or inguinal lymphadenopathy. Reproductive: Not visualized due to streak artifact from the left femoral surgical hardware. Other: Right lower abdominal surgical drain with pigtail within the right mid abdomen just inferior to the liver and gallbladder. No intraperitoneal free fluid. No intraperitoneal free gas. No organized fluid collection. Musculoskeletal: No abdominal wall hernia or abnormality. No suspicious lytic or blastic osseous lesions. No acute displaced fracture. Grade 1 anterolisthesis of L4 on L5 and L5 on S1. Multilevel severe degenerative changes of the spine. Total left hip arthroplasty. At least moderate degenerative changes of the right hip. Review of the MIP images confirms the above findings. IMPRESSION: 1. No pulmonary embolus. 2. No acute intrathoracic abnormality. 3. No acute intra-abdominal or intrapelvic abnormality in a patient with colectomy and right lower quadrant end ostomy. Limited evaluation due to lack of intraperitoneal fat, decompressed bowel, and streak artifact from left femoral surgical hardware. 4. Right lower abdominal surgical drain with pigtail within the right mid abdomen just inferior to the liver and gallbladder. Electronically Signed   By: Tish Frederickson M.D.   On: 08/06/2022 21:03   CT Head Wo Contrast  Result Date: 08/06/2022 CLINICAL DATA:  Mental status change, unknown cause EXAM: CT HEAD WITHOUT CONTRAST TECHNIQUE: Contiguous axial images were obtained from the base of the skull through the vertex without intravenous contrast. RADIATION DOSE REDUCTION:  This exam was performed according to  the departmental dose-optimization program which includes automated exposure control, adjustment of the mA and/or kV according to patient size and/or use of iterative reconstruction technique. COMPARISON:  CT head 01/11/2022 FINDINGS: Brain: Cerebral ventricle sizes are concordant with the degree of cerebral volume loss. Patchy and confluent areas of decreased attenuation are noted throughout the deep and periventricular white matter of the cerebral hemispheres bilaterally, compatible with chronic microvascular ischemic disease. No evidence of large-territorial acute infarction. No parenchymal hemorrhage. No mass lesion. No extra-axial collection. No mass effect or midline shift. No hydrocephalus. Basilar cisterns are patent. Vascular: No hyperdense vessel. Skull: No acute fracture or focal lesion. Sinuses/Orbits: Bilateral maxillary sinus mucosal thickening. Otherwise paranasal sinuses and mastoid air cells are clear. Right lens replacement. Otherwise the orbits are unremarkable. Other: None. IMPRESSION: No acute intracranial abnormality. Electronically Signed   By: Tish Frederickson M.D.   On: 08/06/2022 20:52   DG Chest Port 1 View  Result Date: 08/06/2022 CLINICAL DATA:  Shortness of breath EXAM: PORTABLE CHEST 1 VIEW COMPARISON:  07/21/2022 FINDINGS: Scarring or atelectasis left base. No acute airspace disease. Stable normal cardiomediastinal silhouette with aortic atherosclerosis. No pneumothorax IMPRESSION: No active disease. Scarring or atelectasis at the left base. Electronically Signed   By: Jasmine Pang M.D.   On: 08/06/2022 19:23   CT GUIDED PERITONEAL/RETROPERITONEAL FLUID DRAIN BY PERC CATH  Result Date: 07/25/2022 INDICATION: 76 year old male with history of perforated bowel with postoperative abdominal fluid collection concerning for abscess. EXAM: CT PERC DRAIN PERITONEAL ABCESS COMPARISON:  07/23/2022 MEDICATIONS: The patient is currently admitted to the hospital and receiving intravenous  antibiotics. The antibiotics were administered within an appropriate time frame prior to the initiation of the procedure. ANESTHESIA/SEDATION: Moderate (conscious) sedation was employed during this procedure. A total of Versed 0 mg and Fentanyl 12.5 mcg was administered intravenously. Moderate Sedation Time: 17 minutes. The patient's level of consciousness and vital signs were monitored continuously by radiology nursing throughout the procedure under my direct supervision. CONTRAST:  None COMPLICATIONS: None immediate. PROCEDURE: RADIATION DOSE REDUCTION: This exam was performed according to the departmental dose-optimization program which includes automated exposure control, adjustment of the mA and/or kV according to patient size and/or use of iterative reconstruction technique. Informed written consent was obtained from the patient after a discussion of the risks, benefits and alternatives to treatment. The patient was placed on the CT gantry and a pre procedural CT was performed re-demonstrating the known abscess/fluid collection within the right lower quadrant. The procedure was planned. A timeout was performed prior to the initiation of the procedure. The right lower quadrant was prepped and draped in the usual sterile fashion. The overlying soft tissues were anesthetized with 1% lidocaine with epinephrine. Appropriate trajectory was planned with the use of a 22 gauge spinal needle. An 18 gauge trocar needle was advanced into the abscess/fluid collection and a short Amplatz super stiff wire was coiled within the collection. Appropriate positioning was confirmed with a limited CT scan. The tract was serially dilated allowing placement of a 10 [place] all-purpose drainage catheter. Appropriate positioning was confirmed with a limited postprocedural CT scan. Approximately 3 ml of sanguino-purulent fluid was aspirated. The tube was connected to a bulb suction and sutured in place. A dressing was placed. The patient  tolerated the procedure well without immediate post procedural complication. IMPRESSION: Successful CT guided placement of a 10 French all purpose drain catheter into the right upper quadrant with aspiration of approximately 3 mL of  sanguino-purulent fluid. Samples were sent to the laboratory as requested by the ordering clinical team. Marliss Coots, MD [facility] Radiology Electronically Signed   By: Marliss Coots M.D.   On: 07/25/2022 10:43   CT ABDOMEN PELVIS W CONTRAST  Addendum Date: 07/24/2022   ADDENDUM REPORT: 07/24/2022 13:09 ADDENDUM: At the request of the referring provider, the craniocaudal dimension of the patient's residual abscess is provided, 7.0 cm, compared to 7.8 cm on 07/13/2022. Electronically Signed   By: Leanna Battles M.D.   On: 07/24/2022 13:09   Result Date: 07/24/2022 CLINICAL DATA:  Acute onset hypotension with decreased urinary output. Abscess. EXAM: CT ABDOMEN AND PELVIS WITH CONTRAST TECHNIQUE: Multidetector CT imaging of the abdomen and pelvis was performed using the standard protocol following bolus administration of intravenous contrast. RADIATION DOSE REDUCTION: This exam was performed according to the departmental dose-optimization program which includes automated exposure control, adjustment of the mA and/or kV according to patient size and/or use of iterative reconstruction technique. CONTRAST:  OMNIPAQUE IOHEXOL 300 MG/ML  SOLN COMPARISON:  07/13/2022. FINDINGS: Lower chest: New small right pleural effusion. Moderate left pleural effusion, increased. Compressive atelectasis in both lower lobes. Atherosclerotic calcification of the aortic valve and coronary arteries. Heart is at the upper limits of normal in size to mildly enlarged. No pericardial effusion. Distal esophagus is grossly unremarkable. Hepatobiliary: Probable subcentimeter cysts in the liver. Liver is otherwise unremarkable. Stone in the gallbladder.  Small pericholecystic fluid. No biliary ductal dilatation. Pancreas: 1.4 cm low-attenuation lesion in the pancreatic body, stable. No gland atrophy or ductal dilatation. Spleen: Negative. Adrenals/Urinary Tract: Renal parenchymal thinning bilaterally. Subcentimeter low-attenuation lesion in the left kidney. No specific follow-up necessary. Kidneys are otherwise unremarkable. Ureters are decompressed. Foley catheter is seen in a decompressed bladder. Bladder wall may be thickened. Stomach/Bowel: Stomach and majority of the small bowel are unremarkable. 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 Persistent rectal wall thickening with presacral edema and fluid, as on 07/13/2022. Vascular/Lymphatic: Retroaortic left renal vein. Atherosclerotic calcification of the aorta. Fusiform dilatation of the celiac trunk, 10 mm. No pathologically enlarged lymph nodes. Reproductive: Prostate is visualized. Other: No additional free fluid. Well-circumscribed fluid collection in the ventral right lower quadrant measures 2.2 x 3.7 cm (2/45), previously 2.4 x 4.5 cm on 07/13/2022, at which time a percutaneous drain was in place. No additional evidence of an abscess. Diffuse body wall edema. Musculoskeletal: Left hip arthroplasty. Right hip osteoarthritis. Degenerative changes in the spine. Grade 1 anterolisthesis of L5 on S1. IMPRESSION: 1. Residual abscess in the right lower quadrant, slightly smaller than on 07/13/2022. Interval percutaneous drain removal. 2. New small right pleural effusion. Moderate left pleural effusion, increased. Compressive atelectasis in both lower lobes. 3. Persistent rectal wall thickening with extensive presacral edema/fluid, unchanged. 4. Bladder wall thickening, as before. 5. Diffuse body wall edema. 6. Cholelithiasis. 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 8. 1.4 cm cystic lesion in body of the pancreas, as before. Given patient condition and age, no specific follow-up is recommended  other than on routine imaging. 9. Aortic atherosclerosis (ICD10-I70.0). Coronary artery calcification. Electronically Signed: By: Leanna Battles M.D. On: 07/23/2022 15:34   DG Chest Port 1 View  Result Date: 07/21/2022 CLINICAL DATA:  Sepsis EXAM: PORTABLE CHEST 1 VIEW COMPARISON:  07/07/2022 FINDINGS: The heart size and mediastinal contours are within normal limits. Both lungs are clear. The visualized skeletal structures are unremarkable. IMPRESSION: No active disease. Electronically Signed   By: Caryn Bee  Chase Picket M.D.   On: 07/21/2022 23:07   CT ABDOMEN PELVIS W CONTRAST  Result Date: 07/13/2022 CLINICAL DATA:  Acute nonlocalized abdominal pain; drain in place; follow-up imaging for drain management EXAM: CT ABDOMEN AND PELVIS WITH CONTRAST TECHNIQUE: Multidetector CT imaging of the abdomen and pelvis was performed using the standard protocol following bolus administration of intravenous contrast. RADIATION DOSE REDUCTION: This exam was performed according to the departmental dose-optimization program which includes automated exposure control, adjustment of the mA and/or kV according to patient size and/or use of iterative reconstruction technique. CONTRAST:  8mL OMNIPAQUE IOHEXOL 350 MG/ML SOLN COMPARISON:  CT 07/07/2022 FINDINGS: Lower chest: Mild cardiomegaly. Trace pericardial effusion. Small left pleural effusion unchanged. Left basilar atelectasis/scarring. Hepatobiliary: Hepatic cysts. No follow-up imaging recommended. No evidence of cholecystitis. No biliary dilation. Pancreas: 2 cm homogeneous rounded cystic lesion in the body/tail of the pancreas which is unchanged and could represent a pseudocyst, serous or mucinous cystadenoma or side branch IPMN. MRI without and with contrast is recommended when clinically feasible. No pancreatic ductal dilation. Spleen: Normal in size without focal abnormality. Adrenals/Urinary Tract: Unremarkable adrenal glands. Low-attenuation lesions in the kidneys are  statistically likely to represent cysts. No follow-up is required. Punctate nonobstructing left nephrolithiasis. No hydronephrosis. The bladder is mildly distended and filled with gas and fluid some of which is layering posteriorly and may be hyperdense though evaluation is limited by streak artifact. Foley catheter in the bladder. 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 Normal caliber small bowel filled with enteric contrast. Questionable rectal wall thickening and adjacent stranding/fluid though this area is poorly evaluated due to streak artifact. Right lower quadrant ileostomy. Vascular/Lymphatic: Aortic atherosclerosis. No enlarged abdominal or pelvic lymph nodes. Reproductive: Mild enlargement of the prostate. Other: Right abdominal percutaneous drain within a intermediate density fluid collection along the anterior right abdominal wall tracking inferiorly to the superior aspect of the bladder and superiorly to the undersurface of the right hepatic lobe. This measures approximately 3.6 x 2.0 x 11.4 cm (series 2/image 39 and series 5/image 29). This is slightly decreased in size compared to 07/07/2022 when it measured approximately 4.2 x 2.4 x 15 cm. The gaseous component of the fluid collection has resolved. Mesenteric edema. Small amount of free fluid in the pelvis. Body wall anasarca. Musculoskeletal: Left TKA. No acute osseous abnormality. Advanced thoracolumbar spondylosis. Grade 1 anterolisthesis L4. IMPRESSION: 1. The percutaneous right abdominal wall pigtail catheter remains in the fluid collection in the right hemiabdomen which has slightly decreased in size. 2. The bladder is catheterized but remains distended with air and gas. Questionable layering hyperdense fluid within the posterior bladder though this is obscured by streak artifact. Recommend clinical correlation for catheter dysfunction and possible blood clot within the bladder. 3. Thickened bladder wall.  Cystitis not  excluded. 4. 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 5. The rectum may be mildly thickened with adjacent stranding/fluid. Evaluation is limited by streak artifact. Findings may be due to congestion or proctitis. 6. 2 cm cystic lesion at the junction of the body and tail of the pancreas. MRI without and with contrast is recommended when clinically feasible. Benign and malignant etiologies are possible. 7. Mesenteric edema and small amount of free fluid in the pelvis. Marked body wall anasarca. Findings may be related to fluid overload. 8. Improved left lower lobe pneumonia. Residual small left pleural effusion. 9. Coronary artery and Aortic Atherosclerosis (ICD10-I70.0). Electronically Signed   By: Minerva Fester M.D.   On: 07/13/2022  22:02    Microbiology: Results for orders placed or performed during the hospital encounter of 08/06/22  Resp panel by RT-PCR (RSV, Flu A&B, Covid) Anterior Nasal Swab     Status: None   Collection Time: 08/06/22  7:09 PM   Specimen: Anterior Nasal Swab  Result Value Ref Range Status   SARS Coronavirus 2 by RT PCR NEGATIVE NEGATIVE Final    Comment: (NOTE) SARS-CoV-2 target nucleic acids are NOT DETECTED.  The SARS-CoV-2 RNA is generally detectable in upper respiratory specimens during the acute phase of infection. The lowest concentration of SARS-CoV-2 viral copies this assay can detect is 138 copies/mL. A negative result does not preclude SARS-Cov-2 infection and should not be used as the sole basis for treatment or other patient management decisions. A negative result may occur with  improper specimen collection/handling, submission of specimen other than nasopharyngeal swab, presence of viral mutation(s) within the areas targeted by this assay, and inadequate number of viral copies(<138 copies/mL). A negative result must be combined with clinical observations, patient history, and epidemiological information. The expected result  is Negative.  Fact Sheet for Patients:  BloggerCourse.comhttps://www.fda.gov/media/152166/download  Fact Sheet for Healthcare Providers:  SeriousBroker.ithttps://www.fda.gov/media/152162/download  This test is no t yet approved or cleared by the [place] FDA and  has been authorized for detection and/or diagnosis of SARS-CoV-2 by FDA under an Emergency Use Authorization (EUA). This EUA will remain  in effect (meaning this test can be used) for the duration of the COVID-19 declaration under Section 564(b)(1) of the Act, 21 U.S.C.section 360bbb-3(b)(1), unless the authorization is terminated  or revoked sooner.       Influenza A by PCR NEGATIVE NEGATIVE Final   Influenza B by PCR NEGATIVE NEGATIVE Final    Comment: (NOTE) The Xpert Xpress SARS-CoV-2/FLU/RSV plus assay is intended as an aid in the diagnosis of influenza from Nasopharyngeal swab specimens and should not be used as a sole basis for treatment. Nasal washings and aspirates are unacceptable for Xpert Xpress SARS-CoV-2/FLU/RSV testing.  Fact Sheet for Patients: BloggerCourse.comhttps://www.fda.gov/media/152166/download  Fact Sheet for Healthcare Providers: SeriousBroker.ithttps://www.fda.gov/media/152162/download  This test is not yet approved or cleared by the [place] FDA and has been authorized for detection and/or diagnosis of SARS-CoV-2 by FDA under an Emergency Use Authorization (EUA). This EUA will remain in effect (meaning this test can be used) for the duration of the COVID-19 declaration under Section 564(b)(1) of the Act, 21 U.S.C. section 360bbb-3(b)(1), unless the authorization is terminated or revoked.     Resp Syncytial Virus by PCR NEGATIVE NEGATIVE Final    Comment: (NOTE) Fact Sheet for Patients: BloggerCourse.comhttps://www.fda.gov/media/152166/download  Fact Sheet for Healthcare Providers: SeriousBroker.ithttps://www.fda.gov/media/152162/download  This test is not yet approved or cleared by the [place] FDA and has been authorized for detection and/or diagnosis of  SARS-CoV-2 by FDA under an Emergency Use Authorization (EUA). This EUA will remain in effect (meaning this test can be used) for the duration of the COVID-19 declaration under Section 564(b)(1) of the Act, 21 U.S.C. section 360bbb-3(b)(1), unless the authorization is terminated or revoked.  Performed at [facility], [address] [place], [place] [id number]   Blood Culture (routine x 2)     Status: None (Preliminary result)   Collection Time: 08/06/22  8:10 PM   Specimen: BLOOD LEFT ARM  Result Value Ref Range Status   Specimen Description BLOOD LEFT ARM  Final   Special Requests   Final    BOTTLES DRAWN AEROBIC AND ANAEROBIC Blood Culture results may not  be optimal due to an inadequate volume of blood received in culture bottles   Culture   Final    NO GROWTH 3 DAYS Performed at [facility], [address] [place], [place] [address]    Report Status PENDING  Incomplete  Blood Culture (routine x 2)     Status: None (Preliminary result)   Collection Time: 08/06/22  8:10 PM   Specimen: BLOOD  Result Value Ref Range Status   Specimen Description BLOOD BLOOD RIGHT ARM  Final   Special Requests   Final    BOTTLES DRAWN AEROBIC AND ANAEROBIC Blood Culture results may not be optimal due to an inadequate volume of blood received in culture bottles   Culture   Final    NO GROWTH 3 DAYS Performed at [facility], [address], [place], [place] [address]    Report Status PENDING  Incomplete  Urine Culture     Status: Abnormal (Preliminary result)   Collection Time: 08/07/22  3:32 PM   Specimen: Urine, Catheterized  Result Value Ref Range Status   Specimen Description   Final    URINE, CATHETERIZED Performed at [facility], [address], [place], [place] [address]    Special Requests   Final    NONE Performed at [facility], [address] [place], [place] [address]    Culture (A)  Final    >=100,000 COLONIES/mL  ENTEROBACTER AEROGENES SUSCEPTIBILITIES TO FOLLOW Performed at [facility], [address], [place], [place] [address]    Report Status PENDING  Incomplete  C Difficile Quick Screen w PCR reflex     Status: None   Collection Time: 08/08/22 11:19 AM   Specimen: STOOL  Result Value Ref Range Status   C Diff antigen NEGATIVE NEGATIVE Final   C Diff toxin NEGATIVE NEGATIVE Final   C Diff interpretation No C. difficile detected.  Final    Comment: Performed at [facility], [address] [place], [place] [address]    Labs: CBC: Recent Labs  Lab 08/06/22 1909 08/07/22 0642 08/09/22 0300  WBC 10.6* 7.0  --   NEUTROABS 10.0*  --   --   HGB 8.7* 9.1* 7.5*  HCT 28.9* 30.1*  --   MCV 92.6 92.9  --   PLT 110* 110*  --    Basic Metabolic Panel: Recent Labs  Lab 08/06/22 1909 08/07/22 0642 08/07/22 1547 08/09/22 0300  NA 143 145 146* 145  K 4.4 5.4* 4.9 4.5  CL 113* 121* 117* 118*  CO2 25 21* 24 27  GLUCOSE 125* 128* 121* 93  BUN 35* 37* 38* 29*  CREATININE 1.57* 1.31* 1.44* 1.10  CALCIUM 9.7 9.7 9.8 9.4   Liver Function Tests: Recent Labs  Lab 08/06/22 1909  AST 17  ALT 16  ALKPHOS 67  BILITOT 0.5  PROT 6.8  ALBUMIN 2.7*   CBG: No results for input(s): "GLUCAP" in the last 168 hours.  Discharge time spent: greater than 30 minutes.  Signed: Alford Highland, MD [facility] 08/09/2022  Addendum UC results reviewed and likely colonization from foley.  Foley catheter was removed during this hospital stay.

## 2022-08-09 NOTE — Plan of Care (Signed)
  Problem: Fluid Volume: Goal: Hemodynamic stability will improve Outcome: Progressing   Problem: Clinical Measurements: Goal: Diagnostic test results will improve Outcome: Progressing Goal: Signs and symptoms of infection will decrease Outcome: Progressing   Problem: Respiratory: Goal: Ability to maintain adequate ventilation will improve Outcome: Progressing   Problem: Education: Goal: Knowledge of disease or condition will improve Outcome: Progressing Goal: Knowledge of secondary prevention will improve (MUST DOCUMENT ALL) Outcome: Progressing Goal: Knowledge of patient specific risk factors will improve (Mark N/A or DELETE if not current risk factor) Outcome: Progressing   Problem: Ischemic Stroke/TIA Tissue Perfusion: Goal: Complications of ischemic stroke/TIA will be minimized Outcome: Progressing   Problem: Coping: Goal: Will verbalize positive feelings about self Outcome: Progressing Goal: Will identify appropriate support needs Outcome: Progressing   Problem: Health Behavior/Discharge Planning: Goal: Ability to manage health-related needs will improve Outcome: Progressing Goal: Goals will be collaboratively established with patient/family Outcome: Progressing   Problem: Self-Care: Goal: Ability to participate in self-care as condition permits will improve Outcome: Progressing Goal: Verbalization of feelings and concerns over difficulty with self-care will improve Outcome: Progressing Goal: Ability to communicate needs accurately will improve Outcome: Progressing   Problem: Nutrition: Goal: Risk of aspiration will decrease Outcome: Progressing Goal: Dietary intake will improve Outcome: Progressing   Problem: Education: Goal: Knowledge of General Education information will improve Description: Including pain rating scale, medication(s)/side effects and non-pharmacologic comfort measures Outcome: Progressing   Problem: Health Behavior/Discharge  Planning: Goal: Ability to manage health-related needs will improve Outcome: Progressing   Problem: Clinical Measurements: Goal: Ability to maintain clinical measurements within normal limits will improve Outcome: Progressing Goal: Will remain free from infection Outcome: Progressing Goal: Diagnostic test results will improve Outcome: Progressing Goal: Respiratory complications will improve Outcome: Progressing Goal: Cardiovascular complication will be avoided Outcome: Progressing   Problem: Activity: Goal: Risk for activity intolerance will decrease Outcome: Progressing   Problem: Nutrition: Goal: Adequate nutrition will be maintained Outcome: Progressing   Problem: Coping: Goal: Level of anxiety will decrease Outcome: Progressing   Problem: Elimination: Goal: Will not experience complications related to bowel motility Outcome: Progressing Goal: Will not experience complications related to urinary retention Outcome: Progressing   Problem: Pain Managment: Goal: General experience of comfort will improve Outcome: Progressing   Problem: Safety: Goal: Ability to remain free from injury will improve Outcome: Progressing   Problem: Skin Integrity: Goal: Risk for impaired skin integrity will decrease Outcome: Progressing   

## 2022-08-10 LAB — URINE CULTURE: Culture: >=100000 — AB

## 2022-08-11 LAB — CULTURE, BLOOD (ROUTINE X 2)
Culture: NO GROWTH
Culture: NO GROWTH

## 2022-08-28 ENCOUNTER — Inpatient Hospital Stay: Payer: Medicare Other | Attending: Oncology | Admitting: Oncology

## 2022-08-28 ENCOUNTER — Encounter: Payer: Self-pay | Admitting: Oncology

## 2022-08-28 ENCOUNTER — Inpatient Hospital Stay: Payer: Medicare Other

## 2022-08-28 VITALS — BP 105/55 | HR 81 | Temp 96.4°F

## 2022-08-28 DIAGNOSIS — D649 Anemia, unspecified: Secondary | ICD-10-CM

## 2022-08-28 LAB — CBC WITH DIFFERENTIAL/PLATELET
Abs Immature Granulocytes: 0.01 10*3/uL (ref 0.00–0.07)
Basophils Absolute: 0 10*3/uL (ref 0.0–0.1)
Basophils Relative: 0 %
Eosinophils Absolute: 0.1 10*3/uL (ref 0.0–0.5)
Eosinophils Relative: 1 %
HCT: 34.8 % — ABNORMAL LOW (ref 39.0–52.0)
Hemoglobin: 11.2 g/dL — ABNORMAL LOW (ref 13.0–17.0)
Immature Granulocytes: 0 %
Lymphocytes Relative: 25 %
Lymphs Abs: 1.4 10*3/uL (ref 0.7–4.0)
MCH: 28 pg (ref 26.0–34.0)
MCHC: 32.2 g/dL (ref 30.0–36.0)
MCV: 87 fL (ref 80.0–100.0)
Monocytes Absolute: 0.6 10*3/uL (ref 0.1–1.0)
Monocytes Relative: 10 %
Neutro Abs: 3.7 10*3/uL (ref 1.7–7.7)
Neutrophils Relative %: 64 %
Platelets: 238 10*3/uL (ref 150–400)
RBC: 4 MIL/uL — ABNORMAL LOW (ref 4.22–5.81)
RDW: 15.4 % (ref 11.5–15.5)
WBC: 5.7 10*3/uL (ref 4.0–10.5)
nRBC: 0 % (ref 0.0–0.2)

## 2022-08-28 LAB — BASIC METABOLIC PANEL
Anion gap: 7 (ref 5–15)
BUN: 17 mg/dL (ref 8–23)
CO2: 26 mmol/L (ref 22–32)
Calcium: 9.8 mg/dL (ref 8.9–10.3)
Chloride: 108 mmol/L (ref 98–111)
Creatinine, Ser: 1.11 mg/dL (ref 0.61–1.24)
GFR, Estimated: >60 mL/min (ref >60)
Glucose, Bld: 97 mg/dL (ref 70–99)
Potassium: 4.4 mmol/L (ref 3.5–5.1)
Sodium: 141 mmol/L (ref 135–145)

## 2022-08-28 LAB — LACTATE DEHYDROGENASE: LDH: 138 U/L (ref 98–192)

## 2022-08-28 LAB — RETICULOCYTES
Immature Retic Fract: 12 % (ref 2.3–15.9)
RBC.: 3.9 MIL/uL — ABNORMAL LOW (ref 4.22–5.81)
Retic Count, Absolute: 114.7 10*3/uL (ref 19.0–186.0)
Retic Ct Pct: 2.9 % (ref 0.4–3.1)

## 2022-08-28 LAB — TSH: TSH: 1.459 u[IU]/mL (ref 0.350–4.500)

## 2022-08-28 MED ORDER — SODIUM CHLORIDE 0.9 % IV SOLN
Freq: Once | INTRAVENOUS | Status: AC
  Filled 2022-08-28: qty 250

## 2022-08-28 NOTE — Patient Instructions (Signed)
Dehydration, Adult Dehydration is condition in which there is not enough water or other fluids in the body. This happens when a person loses more fluids than he or she takes in. Important body parts cannot work right without the right amount of fluids. Any loss of fluids from the body can cause dehydration. Dehydration can be mild, worse, or very bad. It should be treated right away to keep it from getting very bad. What are the causes? This condition may be caused by: Conditions that cause loss of water or other fluids, such as: Watery poop (diarrhea). Vomiting. Sweating a lot. Peeing (urinating) a lot. Not drinking enough fluids, especially when you: Are ill. Are doing things that take a lot of energy to do. Other illnesses and conditions, such as fever or infection. Certain medicines, such as medicines that take extra fluid out of the body (diuretics). Lack of safe drinking water. Not being able to get enough water and food. What increases the risk? The following factors may make you more likely to develop this condition: Having a long-term (chronic) illness that has not been treated the right way, such as: Diabetes. Heart disease. Kidney disease. Being 65 years of age or older. Having a disability. Living in a place that is high above the ground or sea (high in altitude). The thinner, dried air causes more fluid loss. Doing exercises that put stress on your body for a long time. What are the signs or symptoms? Symptoms of dehydration depend on how bad it is. Mild or worse dehydration Thirst. Dry lips or dry mouth. Feeling dizzy or light-headed, especially when you stand up from sitting. Muscle cramps. Your body making: Dark pee (urine). Pee may be the color of tea. Less pee than normal. Less tears than normal. Headache. Very bad dehydration Changes in skin. Skin may: Be cold to the touch (clammy). Be blotchy or pale. Not go back to normal right after you lightly pinch  it and let it go. Little or no tears, pee, or sweat. Changes in vital signs, such as: Fast breathing. Low blood pressure. Weak pulse. Pulse that is more than 100 beats a minute when you are sitting still. Other changes, such as: Feeling very thirsty. Eyes that look hollow (sunken). Cold hands and feet. Being mixed up (confused). Being very tired (lethargic) or having trouble waking from sleep. Short-term weight loss. Loss of consciousness. How is this treated? Treatment for this condition depends on how bad it is. Treatment should start right away. Do not wait until your condition gets very bad. Very bad dehydration is an emergency. You will need to go to a hospital. Mild or worse dehydration can be treated at home. You may be asked to: Drink more fluids. Drink an oral rehydration solution (ORS). This drink helps get the right amounts of fluids and salts and minerals in the blood (electrolytes). Very bad dehydration can be treated: With fluids through an IV tube. By getting normal levels of salts and minerals in your blood. This is often done by giving salts and minerals through a tube. The tube is passed through your nose and into your stomach. By treating the root cause. Follow these instructions at home: Oral rehydration solution If told by your doctor, drink an ORS: Make an ORS. Use instructions on the package. Start by drinking small amounts, about  cup (120 mL) every 5-10 minutes. Slowly drink more until you have had the amount that your doctor said to have. Eating and drinking          Drink enough clear fluid to keep your pee pale yellow. If you were told to drink an ORS, finish the ORS first. Then, start slowly drinking other clear fluids. Drink fluids such as: Water. Do not drink only water. Doing that can make the salt (sodium) level in your body get too low. Water from ice chips you suck on. Fruit juice that you have added water to (diluted). Low-calorie sports  drinks. Eat foods that have the right amounts of salts and minerals, such as: Bananas. Oranges. Potatoes. Tomatoes. Spinach. Do not drink alcohol. Avoid: Drinks that have a lot of sugar. These include: High-calorie sports drinks. Fruit juice that you did not add water to. Soda. Caffeine. Foods that are greasy or have a lot of fat or sugar. General instructions Take over-the-counter and prescription medicines only as told by your doctor. Do not take salt tablets. Doing that can make the salt level in your body get too high. Return to your normal activities as told by your doctor. Ask your doctor what activities are safe for you. Keep all follow-up visits as told by your doctor. This is important. Contact a doctor if: You have pain in your belly (abdomen) and the pain: Gets worse. Stays in one place. You have a rash. You have a stiff neck. You get angry or annoyed (irritable) more easily than normal. You are more tired or have a harder time waking than normal. You feel: Weak or dizzy. Very thirsty. Get help right away if you have: Any symptoms of very bad dehydration. Symptoms of vomiting, such as: You cannot eat or drink without vomiting. Your vomiting gets worse or does not go away. Your vomit has blood or green stuff in it. Symptoms that get worse with treatment. A fever. A very bad headache. Problems with peeing or pooping (having a bowel movement), such as: Watery poop that gets worse or does not go away. Blood in your poop (stool). This may cause poop to look black and tarry. Not peeing in 6-8 hours. Peeing only a small amount of very dark pee in 6-8 hours. Trouble breathing. These symptoms may be an emergency. Do not wait to see if the symptoms will go away. Get medical help right away. Call your local emergency services (911 in the [place] Do not drive yourself to the hospital. Summary Dehydration is a condition in which there is not enough water or other fluids  in the body. This happens when a person loses more fluids than he or she takes in. Treatment for this condition depends on how bad it is. Treatment should be started right away. Do not wait until your condition gets very bad. Drink enough clear fluid to keep your pee pale yellow. If you were told to drink an oral rehydration solution (ORS), finish the ORS first. Then, start slowly drinking other clear fluids. Take over-the-counter and prescription medicines only as told by your doctor. Get help right away if you have any symptoms of very bad dehydration. This information is not intended to replace advice given to you by your health care provider. Make sure you discuss any questions you have with your health care provider. Document Revised: 12/13/2021 Document Reviewed: 03/19/2019 Elsevier Patient Education  2023 [employer]  

## 2022-08-28 NOTE — Progress Notes (Signed)
Hematology/Oncology Consult note [facility] Telephone:([phone] [phone]  Patient Care Team: Mick Sell, MD as PCP - General (Infectious Diseases) End, Cristal Deer, MD as PCP - Cardiology (Cardiology)   Name of the patient: Carl Figueroa  [id number]  1946/06/20    Reason for referral-anemia   Referring physician-Dr. Sampson Goon  Date of visit: 08/28/22   History of presenting illness-patient is a 77 year old maleWho underwent total colectomy end ileostomy at the [place] in October 2023.  He has a history of Lewy body dementia.  He has been having back-to-back hospitalizations since his colectomy for altered mental status as well as intra-abdominal abscesses and UTIs.  He was discharged from the hospital 3 weeks ago.  During his hospitalization his hemoglobin had dropped down to 7.6 needing blood transfusion.  At baseline his hemoglobin runs around 11-12.  Ferritin and iron studies B12 and folate were checked during hospitalization and were normal and consistent with anemia of chronic disease.    He is here with his wife today.  Wife is concerned that he is exhibiting more generalized weakness and this is typically how he presents when he needs to get hospitalized.  She is wondering if he can receive IV fluids at our clinic today.  ECOG PS- 3  Pain scale- 0   Review of systems- Review of Systems  Constitutional:  Positive for malaise/fatigue. Negative for chills, fever and weight loss.  HENT:  Negative for congestion, ear discharge and nosebleeds.   Eyes:  Negative for blurred vision.  Respiratory:  Negative for cough, hemoptysis, sputum production, shortness of breath and wheezing.   Cardiovascular:  Negative for chest pain, palpitations, orthopnea and claudication.  Gastrointestinal:  Negative for abdominal pain, blood in stool, constipation, diarrhea, heartburn, melena, nausea and vomiting.  Genitourinary:  Negative for dysuria, flank  pain, frequency, hematuria and urgency.  Musculoskeletal:  Negative for back pain, joint pain and myalgias.  Skin:  Negative for rash.  Neurological:  Negative for dizziness, tingling, focal weakness, seizures, weakness and headaches.  Endo/Heme/Allergies:  Does not bruise/bleed easily.  Psychiatric/Behavioral:  Negative for depression and suicidal ideas. The patient does not have insomnia.     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   Protein-calorie malnutrition, severe 07/24/2022   Intra-abdominal abscess (HCC) 07/24/2022   AKI (acute kidney injury) (HCC) 07/22/2022   Dyslipidemia 07/22/2022   Dementia, senile with depression (HCC) 07/22/2022   BPH (benign prostatic hyperplasia) 07/22/2022   Sacral decubitus ulcer, stage II (HCC) 07/22/2022   Septic embolism (HCC) 07/16/2022   Pressure injury of skin 07/16/2022   Severe sepsis (HCC) 07/07/2022   Foley catheter in place on admission 07/07/2022   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   Left lower lobe pneumonia 07/07/2022   S/P laparotomy 07/07/2022   Staring episodes 07/07/2022   Long term (current) use of antibiotics 07/07/2022   Hypotension 07/07/2022   Hyperkalemia 07/07/2022   Social discord 01/12/2022   Chronic post-traumatic stress disorder 08/03/2021   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/2021   Impotence 08/03/2021   Primary open angle glaucoma, 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   Tremor 08/03/2021   Memory loss 08/03/2021   Gait abnormality 08/03/2021   Status post total hip replacement, 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  Demand ischemia 07/30/2019   Anxiety and depression 07/30/2019   Acute metabolic encephalopathy 07/08/2019   Hematuria 07/08/2019   Thrombocytopenia (HCC)  07/08/2019   Elevated troponin 07/08/2019   Sepsis (HCC) 07/07/2019   Essential hypertension 07/07/2019   Acute renal failure superimposed on stage 3a chronic kidney disease (HCC) 07/07/2019     Past Medical History:  Diagnosis Date   Anxiety    Arthritis    Hypertension    Lewy body dementia (HCC)      Past Surgical History:  Procedure Laterality Date   COLON SURGERY     COLONOSCOPY     COLOSTOMY Right    FOOT SURGERY     X2   HERNIA REPAIR Right    INGUINAL    IR RADIOLOGIST EVAL & MGMT  08/08/2022   perforated bowel  05/2022   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   Procedure: TOTAL HIP ARTHROPLASTY ANTERIOR APPROACH;  Surgeon: Kennedy Bucker, MD;  Location: [facility] ORS;  Service: Orthopedics;  Laterality: Left;    Social History   Socioeconomic History   Marital status: Married    Spouse name: Not on file   Number of children: Not on file   Years of education: Not on file   Highest education level: Not on file  Occupational History   Not on file  Tobacco Use   Smoking status: Former    Packs/day: 2.00    Years: 20.00    Total pack years: 40.00    Types: Cigarettes    Quit date: 10/22/1979    Years since quitting: 42.8   Smokeless tobacco: Never  Vaping Use   Vaping Use: Never used  Substance and Sexual Activity   Alcohol use: No   Drug use: No   Sexual activity: Not on file  Other Topics Concern   Not on file  Social History Narrative   Not on file   Social Determinants of Health   Financial Resource Strain: Not on file  Food Insecurity: No Food Insecurity (08/28/2022)   Hunger Vital Sign    Worried About Running Out of Food in the Last Year: Never true    Ran Out of Food in the Last Year: Never true  Transportation Needs: No Transportation Needs (08/08/2022)   PRAPARE - Administrator, Civil Service (Medical): No    Lack of Transportation (Non-Medical): No  Physical Activity: Not on file  Stress: Not on file  Social Connections: Not  on file  Intimate Partner Violence: Not At Risk (08/08/2022)   Humiliation, Afraid, Rape, and Kick questionnaire    Fear of Current or Ex-Partner: No    Emotionally Abused: No    Physically Abused: No    Sexually Abused: No     Family History  Problem Relation Age of Onset   Cancer Mother    Diabetes Mellitus II Neg Hx      Current Outpatient Medications:    ascorbic acid (VITAMIN C) 500 MG tablet, Take 1 tablet (500 mg total) by mouth 2 (two) times daily., Disp: 60 tablet, Rfl: 0   feeding supplement (ENSURE ENLIVE / ENSURE PLUS) LIQD, Take 237 mLs by mouth 3 (three) times daily between meals., Disp: 21330 mL, Rfl: 0   finasteride (PROSCAR) 5 MG tablet, Take 1 tablet (5 mg total) by mouth daily., Disp: 30 tablet, Rfl: 0   sertraline (ZOLOFT) 25 MG tablet, Take 25 mg by mouth daily., Disp: , Rfl:    tamsulosin (FLOMAX) 0.4 MG CAPS capsule, Take  1 capsule (0.4 mg total) by mouth daily., Disp: 30 capsule, Rfl: 0   traZODone (DESYREL) 50 MG tablet, Take 1 tablet (50 mg total) by mouth at bedtime., Disp: 30 tablet, Rfl: 0   Multiple Vitamin (MULTIVITAMIN WITH MINERALS) TABS tablet, Take 1 tablet by mouth daily. (Patient not taking: Reported on 08/06/2022), Disp: , Rfl:    zinc sulfate 220 (50 Zn) MG capsule, Take 1 capsule (220 mg total) by mouth daily. (Patient not taking: Reported on 08/06/2022), Disp: 30 capsule, Rfl: 0   Physical exam:  Vitals:   08/28/22 1347  BP: (!) 105/55  Pulse: 81  Temp: (!) 96.4 F (35.8 C)  TempSrc: Tympanic   Physical Exam Cardiovascular:     Rate and Rhythm: Normal rate and regular rhythm.     Heart sounds: Normal heart sounds.  Pulmonary:     Effort: Pulmonary effort is normal.     Breath sounds: Normal breath sounds.  Abdominal:     General: Bowel sounds are normal.     Palpations: Abdomen is soft.  Skin:    General: Skin is warm and dry.  Neurological:     General: No focal deficit present.     Mental Status: He is alert.     Comments:  Oriented to self and place.  Has somewhat of a flat affect.  Sitting in a wheelchair           Latest Ref Rng & Units 08/28/2022    3:20 PM  CMP  Glucose 70 - 99 mg/dL 97   BUN 8 - 23 mg/dL 17   Creatinine 0.61 - 1.24 mg/dL 1.11   Sodium 135 - 145 mmol/L 141   Potassium 3.5 - 5.1 mmol/L 4.4   Chloride 98 - 111 mmol/L 108   CO2 22 - 32 mmol/L 26   Calcium 8.9 - 10.3 mg/dL 9.8       Latest Ref Rng & Units 08/28/2022    3:20 PM  CBC  WBC 4.0 - 10.5 K/uL 5.7   Hemoglobin 13.0 - 17.0 g/dL 11.2   Hematocrit 39.0 - 52.0 % 34.8   Platelets 150 - 400 K/uL 238     No images are attached to the encounter.  IR Radiologist Eval & Mgmt  Result Date: 08/08/2022 EXAM: ESTABLISHED PATIENT CONSULTATION CHIEF COMPLAINT: Indwelling drain HISTORY OF PRESENT ILLNESS: Progress note in the electronic medical record REVIEW OF SYSTEMS: Electronic record PHYSICAL EXAMINATION: Electronic record ASSESSMENT AND PLAN: Drain was removed given resolution of fluid and sterility of fluid Electronically Signed   By: Corrie Mckusick D.O.   On: 08/08/2022 12:41   CT Angio Chest PE W/Cm &/Or Wo Cm  Result Date: 08/06/2022 CLINICAL DATA:  Sepsis Abdominal pain, acute, nonlocalized; Pulmonary embolism (PE) suspected, high prob EXAM: CT ANGIOGRAPHY CHEST CT ABDOMEN AND PELVIS WITH CONTRAST TECHNIQUE: Multidetector CT imaging of the chest was performed using the standard protocol during bolus administration of intravenous contrast. Multiplanar CT image reconstructions and MIPs were obtained to evaluate the vascular anatomy. Multidetector CT imaging of the abdomen and pelvis was performed using the standard protocol during bolus administration of intravenous contrast. RADIATION DOSE REDUCTION: This exam was performed according to the departmental dose-optimization program which includes automated exposure control, adjustment of the mA and/or kV according to patient size and/or use of iterative reconstruction technique.  CONTRAST:  24mL OMNIPAQUE IOHEXOL 350 MG/ML SOLN COMPARISON:  None Available. FINDINGS: CTA CHEST FINDINGS Cardiovascular: Satisfactory opacification of the pulmonary arteries to the segmental level.  No evidence of pulmonary embolism. Normal heart size. No significant pericardial effusion. The thoracic aorta is normal in caliber. Mild atherosclerotic plaque of the thoracic aorta. Left anterior descending coronary artery calcifications. Mediastinum/Nodes: No enlarged mediastinal, hilar, or axillary lymph nodes. Thyroid gland, trachea, and esophagus demonstrate no significant findings. Lungs/Pleura: Elevated left hemidiaphragm. Left lower lobe linear atelectasis versus scarring. No focal consolidation. No pulmonary nodule. No pulmonary mass. No pleural effusion. No pneumothorax. Musculoskeletal: Bilateral trace gynecomastia.  No chest wall abnormality. No suspicious lytic or blastic osseous lesions. No acute displaced fracture. Multilevel degenerative changes of the spine. Review of the MIP images confirms the above findings. CT ABDOMEN and PELVIS FINDINGS Hepatobiliary: No focal liver abnormality. No gallstones, gallbladder wall thickening, or pericholecystic fluid. No biliary dilatation. Pancreas: Diffusely atrophic. No focal lesion. Otherwise normal pancreatic contour. No surrounding inflammatory changes. No main pancreatic ductal dilatation. Spleen: Normal in size without focal abnormality. Adrenals/Urinary Tract: No adrenal nodule bilaterally. Bilateral kidneys enhance symmetrically. Fluid density lesion within the left kidney likely represents a simple renal cyst. Simple renal cysts, in the absence of clinically indicated signs/symptoms, require no independent follow-up. No hydronephrosis. No hydroureter. The urinary bladder is not well visualized due to streak artifact originating from the left femoral surgical hardware. Foley catheter noted within the urinary bladder lumen with complete decompression of the  urinary bladder. 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 Right lower quadrant ostomy formation. Majority of the bowel is decompressed. Stomach is within normal limits. No evidence of bowel wall thickening or dilatation. Limited evaluation of the active rectum due to streak artifact originating from the left femoral surgical hardware. Appendix appears normal. Vascular/Lymphatic: No abdominal aorta or iliac aneurysm. Mild atherosclerotic plaque of the aorta and its branches. No abdominal, pelvic, or inguinal lymphadenopathy. Reproductive: Not visualized due to streak artifact from the left femoral surgical hardware. Other: Right lower abdominal surgical drain with pigtail within the right mid abdomen just inferior to the liver and gallbladder. No intraperitoneal free fluid. No intraperitoneal free gas. No organized fluid collection. Musculoskeletal: No abdominal wall hernia or abnormality. No suspicious lytic or blastic osseous lesions. No acute displaced fracture. Grade 1 anterolisthesis of L4 on L5 and L5 on S1. Multilevel severe degenerative changes of the spine. Total left hip arthroplasty. At least moderate degenerative changes of the right hip. Review of the MIP images confirms the above findings. IMPRESSION: 1. No pulmonary embolus. 2. No acute intrathoracic abnormality. 3. No acute intra-abdominal or intrapelvic abnormality in a patient with colectomy and right lower quadrant end ostomy. Limited evaluation due to lack of intraperitoneal fat, decompressed bowel, and streak artifact from left femoral surgical hardware. 4. Right lower abdominal surgical drain with pigtail within the right mid abdomen just inferior to the liver and gallbladder. Electronically Signed   By: Tish Frederickson M.D.   On: 08/06/2022 21:03   CT Abdomen Pelvis W Contrast  Result Date: 08/06/2022 CLINICAL DATA:  Sepsis Abdominal pain, acute, nonlocalized; Pulmonary embolism (PE) suspected, high prob EXAM: CT  ANGIOGRAPHY CHEST CT ABDOMEN AND PELVIS WITH CONTRAST TECHNIQUE: Multidetector CT imaging of the chest was performed using the standard protocol during bolus administration of intravenous contrast. Multiplanar CT image reconstructions and MIPs were obtained to evaluate the vascular anatomy. Multidetector CT imaging of the abdomen and pelvis was performed using the standard protocol during bolus administration of intravenous contrast. RADIATION DOSE REDUCTION: This exam was performed according to the departmental dose-optimization program which includes automated exposure control, adjustment of the mA and/or  kV according to patient size and/or use of iterative reconstruction technique. CONTRAST:  82mL OMNIPAQUE IOHEXOL 350 MG/ML SOLN COMPARISON:  None Available. FINDINGS: CTA CHEST FINDINGS Cardiovascular: Satisfactory opacification of the pulmonary arteries to the segmental level. No evidence of pulmonary embolism. Normal heart size. No significant pericardial effusion. The thoracic aorta is normal in caliber. Mild atherosclerotic plaque of the thoracic aorta. Left anterior descending coronary artery calcifications. Mediastinum/Nodes: No enlarged mediastinal, hilar, or axillary lymph nodes. Thyroid gland, trachea, and esophagus demonstrate no significant findings. Lungs/Pleura: Elevated left hemidiaphragm. Left lower lobe linear atelectasis versus scarring. No focal consolidation. No pulmonary nodule. No pulmonary mass. No pleural effusion. No pneumothorax. Musculoskeletal: Bilateral trace gynecomastia.  No chest wall abnormality. No suspicious lytic or blastic osseous lesions. No acute displaced fracture. Multilevel degenerative changes of the spine. Review of the MIP images confirms the above findings. CT ABDOMEN and PELVIS FINDINGS Hepatobiliary: No focal liver abnormality. No gallstones, gallbladder wall thickening, or pericholecystic fluid. No biliary dilatation. Pancreas: Diffusely atrophic. No focal lesion.  Otherwise normal pancreatic contour. No surrounding inflammatory changes. No main pancreatic ductal dilatation. Spleen: Normal in size without focal abnormality. Adrenals/Urinary Tract: No adrenal nodule bilaterally. Bilateral kidneys enhance symmetrically. Fluid density lesion within the left kidney likely represents a simple renal cyst. Simple renal cysts, in the absence of clinically indicated signs/symptoms, require no independent follow-up. No hydronephrosis. No hydroureter. The urinary bladder is not well visualized due to streak artifact originating from the left femoral surgical hardware. Foley catheter noted within the urinary bladder lumen with complete decompression of the urinary bladder. 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 Right lower quadrant ostomy formation. Majority of the bowel is decompressed. Stomach is within normal limits. No evidence of bowel wall thickening or dilatation. Limited evaluation of the active rectum due to streak artifact originating from the left femoral surgical hardware. Appendix appears normal. Vascular/Lymphatic: No abdominal aorta or iliac aneurysm. Mild atherosclerotic plaque of the aorta and its branches. No abdominal, pelvic, or inguinal lymphadenopathy. Reproductive: Not visualized due to streak artifact from the left femoral surgical hardware. Other: Right lower abdominal surgical drain with pigtail within the right mid abdomen just inferior to the liver and gallbladder. No intraperitoneal free fluid. No intraperitoneal free gas. No organized fluid collection. Musculoskeletal: No abdominal wall hernia or abnormality. No suspicious lytic or blastic osseous lesions. No acute displaced fracture. Grade 1 anterolisthesis of L4 on L5 and L5 on S1. Multilevel severe degenerative changes of the spine. Total left hip arthroplasty. At least moderate degenerative changes of the right hip. Review of the MIP images confirms the above findings.  IMPRESSION: 1. No pulmonary embolus. 2. No acute intrathoracic abnormality. 3. No acute intra-abdominal or intrapelvic abnormality in a patient with colectomy and right lower quadrant end ostomy. Limited evaluation due to lack of intraperitoneal fat, decompressed bowel, and streak artifact from left femoral surgical hardware. 4. Right lower abdominal surgical drain with pigtail within the right mid abdomen just inferior to the liver and gallbladder. Electronically Signed   By: Tish Frederickson M.D.   On: 08/06/2022 21:03   CT Head Wo Contrast  Result Date: 08/06/2022 CLINICAL DATA:  Mental status change, unknown cause EXAM: CT HEAD WITHOUT CONTRAST TECHNIQUE: Contiguous axial images were obtained from the base of the skull through the vertex without intravenous contrast. RADIATION DOSE REDUCTION: This exam was performed according to the departmental dose-optimization program which includes automated exposure control, adjustment of the mA and/or kV according to patient size and/or use of  iterative reconstruction technique. COMPARISON:  CT head 01/11/2022 FINDINGS: Brain: Cerebral ventricle sizes are concordant with the degree of cerebral volume loss. Patchy and confluent areas of decreased attenuation are noted throughout the deep and periventricular white matter of the cerebral hemispheres bilaterally, compatible with chronic microvascular ischemic disease. No evidence of large-territorial acute infarction. No parenchymal hemorrhage. No mass lesion. No extra-axial collection. No mass effect or midline shift. No hydrocephalus. Basilar cisterns are patent. Vascular: No hyperdense vessel. Skull: No acute fracture or focal lesion. Sinuses/Orbits: Bilateral maxillary sinus mucosal thickening. Otherwise paranasal sinuses and mastoid air cells are clear. Right lens replacement. Otherwise the orbits are unremarkable. Other: None. IMPRESSION: No acute intracranial abnormality. Electronically Signed   By: Tish FredericksonMorgane  Naveau  M.D.   On: 08/06/2022 20:52   DG Chest Port 1 View  Result Date: 08/06/2022 CLINICAL DATA:  Shortness of breath EXAM: PORTABLE CHEST 1 VIEW COMPARISON:  07/21/2022 FINDINGS: Scarring or atelectasis left base. No acute airspace disease. Stable normal cardiomediastinal silhouette with aortic atherosclerosis. No pneumothorax IMPRESSION: No active disease. Scarring or atelectasis at the left base. Electronically Signed   By: Jasmine PangKim  Fujinaga M.D.   On: 08/06/2022 19:23    Assessment and plan- Patient is a 77 y.o. male referred for anemia  Suspect normocytic anemia secondary to chronic disease in the setting of acute events.  Iron studies B12 and folate were normal.  I will perform some additional anemia workup including LDH haptoglobin reticulocyte count myeloma panel and serum free light chains.  In person or video visit with me in 2 to 3 weeks time.  If his hemoglobin remains overall stable without any declining trends I will hold off on bone marrow biopsy.  Encouraged patient's wife to speak to Dr. Sampson GoonFitzgerald to arrange for elective IV fluids if it has helped him in the past.  Unfortunately were not able to offer that to him today.  Also it would not be a sustainable option for him to receive IV fluids at the cancer center in the absence of any known malignancy.   Thank you for this kind referral and the opportunity to participate in the care of this  Patient   Visit Diagnosis 1. Normocytic anemia     Dr. Owens SharkArchana Ramyah Pankowski, MD, MPH [facility] [phone] 08/28/2022

## 2022-08-29 LAB — HAPTOGLOBIN: Haptoglobin: 170 mg/dL (ref 34–355)

## 2022-08-29 LAB — KAPPA/LAMBDA LIGHT CHAINS
Kappa free light chain: 60.7 mg/L — ABNORMAL HIGH (ref 3.3–19.4)
Kappa, lambda light chain ratio: 3.1 — ABNORMAL HIGH (ref 0.26–1.65)
Lambda free light chains: 19.6 mg/L (ref 5.7–26.3)

## 2022-08-31 LAB — MULTIPLE MYELOMA PANEL, SERUM
Albumin SerPl Elph-Mcnc: 3.6 g/dL (ref 2.9–4.4)
Albumin/Glob SerPl: 1.1 (ref 0.7–1.7)
Alpha 1: 0.3 g/dL (ref 0.0–0.4)
Alpha2 Glob SerPl Elph-Mcnc: 0.7 g/dL (ref 0.4–1.0)
B-Globulin SerPl Elph-Mcnc: 1 g/dL (ref 0.7–1.3)
Gamma Glob SerPl Elph-Mcnc: 1.4 g/dL (ref 0.4–1.8)
Globulin, Total: 3.5 g/dL (ref 2.2–3.9)
IgA: 272 mg/dL (ref 61–437)
IgG (Immunoglobin G), Serum: 1459 mg/dL (ref 603–1613)
IgM (Immunoglobulin M), Srm: 85 mg/dL (ref 15–143)
Total Protein ELP: 7.1 g/dL (ref 6.0–8.5)

## 2022-09-24 ENCOUNTER — Inpatient Hospital Stay: Payer: Medicare Other | Attending: Oncology | Admitting: Oncology

## 2022-09-24 ENCOUNTER — Encounter: Payer: Self-pay | Admitting: Oncology

## 2022-09-24 VITALS — BP 114/61 | HR 69 | Temp 96.6°F | Wt 132.0 lb

## 2022-09-24 DIAGNOSIS — Z87891 Personal history of nicotine dependence: Secondary | ICD-10-CM | POA: Diagnosis not present

## 2022-09-24 DIAGNOSIS — I10 Essential (primary) hypertension: Secondary | ICD-10-CM | POA: Diagnosis not present

## 2022-09-24 DIAGNOSIS — Z79899 Other long term (current) drug therapy: Secondary | ICD-10-CM | POA: Diagnosis not present

## 2022-09-24 DIAGNOSIS — D649 Anemia, unspecified: Secondary | ICD-10-CM | POA: Insufficient documentation

## 2022-09-24 DIAGNOSIS — D638 Anemia in other chronic diseases classified elsewhere: Secondary | ICD-10-CM

## 2022-09-24 NOTE — Progress Notes (Signed)
Hematology/Oncology Consult note [facility]  [phone] [phone]  Patient Care Team: Leonel Ramsay, MD as PCP - General (Infectious Diseases) End, Harrell Gave, MD as PCP - Cardiology (Cardiology)   Name of the patient: Carl Figueroa  [id number]  1946-01-24   Date of visit: 09/24/22  Diagnosis-anemia of chronic disease  Chief complaint/ Reason for visit-routine follow-up of anemia  Heme/Onc history: patient is a 77 year old maleWho underwent total colectomy end ileostomy at the [place] in October 2023.  He has a history of Lewy body dementia.  He has been having back-to-back hospitalizations since his colectomy for altered mental status as well as intra-abdominal abscesses and UTIs.  He was discharged from the hospital 3 weeks ago.  During his hospitalization his hemoglobin had dropped down to 7.6 needing blood transfusion.  At baseline his hemoglobin runs around 11-12.  Ferritin and iron studies B12 and folate were checked during hospitalization and were normal and consistent with anemia of chronic disease.  Workup for multiple myeloma negative.  No evidence of hemolysis.   Interval history-he is doing well presently.  No hospitalization since last visit.  Appetite is getting better.  Strength is improving.  ECOG PS- 2-3 Pain scale- 0   Review of systems- Review of Systems  Constitutional:  Positive for malaise/fatigue. Negative for chills, fever and weight loss.  HENT:  Negative for congestion, ear discharge and nosebleeds.   Eyes:  Negative for blurred vision.  Respiratory:  Negative for cough, hemoptysis, sputum production, shortness of breath and wheezing.   Cardiovascular:  Negative for chest pain, palpitations, orthopnea and claudication.  Gastrointestinal:  Negative for abdominal pain, blood in stool, constipation, diarrhea, heartburn, melena, nausea and vomiting.  Genitourinary:  Negative for dysuria, flank pain, frequency,  hematuria and urgency.  Musculoskeletal:  Negative for back pain, joint pain and myalgias.  Skin:  Negative for rash.  Neurological:  Negative for dizziness, tingling, focal weakness, seizures, weakness and headaches.  Endo/Heme/Allergies:  Does not bruise/bleed easily.  Psychiatric/Behavioral:  Negative for depression and suicidal ideas. The patient does not have insomnia.       Allergies  Allergen Reactions   Morphine Nausea Only     Past Medical History:  Diagnosis Date   Anxiety    Arthritis    Hypertension    Lewy body dementia (Page)      Past Surgical History:  Procedure Laterality Date   COLON SURGERY     COLONOSCOPY     COLOSTOMY Right    FOOT SURGERY     X2   HERNIA REPAIR Right    INGUINAL    IR RADIOLOGIST EVAL & MGMT  08/08/2022   perforated bowel  05/2022   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   Procedure: TOTAL HIP ARTHROPLASTY ANTERIOR APPROACH;  Surgeon: Hessie Knows, MD;  Location: [facility] ORS;  Service: Orthopedics;  Laterality: Left;    Social History   Socioeconomic History   Marital status: Married    Spouse name: Not on file   Number of children: Not on file   Years of education: Not on file   Highest education level: Not on file  Occupational History   Not on file  Tobacco Use   Smoking status: Former    Packs/day: 2.00    Years: 20.00    Total pack years: 40.00    Types: Cigarettes    Quit date: 10/22/1979    Years since quitting: 42.9   Smokeless tobacco: Never  Vaping Use  Vaping Use: Never used  Substance and Sexual Activity   Alcohol use: No   Drug use: No   Sexual activity: Not on file  Other Topics Concern   Not on file  Social History Narrative   Not on file   Social Determinants of Health   Financial Resource Strain: Not on file  Food Insecurity: No Food Insecurity (08/28/2022)   Hunger Vital Sign    Worried About Running Out of Food in the Last Year: Never true    Ran Out of Food in the Last Year: Never true   Transportation Needs: No Transportation Needs (08/08/2022)   PRAPARE - Hydrologist (Medical): No    Lack of Transportation (Non-Medical): No  Physical Activity: Not on file  Stress: Not on file  Social Connections: Not on file  Intimate Partner Violence: Not At Risk (08/08/2022)   Humiliation, Afraid, Rape, and Kick questionnaire    Fear of Current or Ex-Partner: No    Emotionally Abused: No    Physically Abused: No    Sexually Abused: No    Family History  Problem Relation Age of Onset   Cancer Mother    Diabetes Mellitus II Neg Hx      Current Outpatient Medications:    ascorbic acid (VITAMIN C) 500 MG tablet, Take 1 tablet (500 mg total) by mouth 2 (two) times daily., Disp: 60 tablet, Rfl: 0   feeding supplement (ENSURE ENLIVE / ENSURE PLUS) LIQD, Take 237 mLs by mouth 3 (three) times daily between meals., Disp: 21330 mL, Rfl: 0   finasteride (PROSCAR) 5 MG tablet, Take 1 tablet (5 mg total) by mouth daily., Disp: 30 tablet, Rfl: 0   Multiple Vitamin (MULTIVITAMIN WITH MINERALS) TABS tablet, Take 1 tablet by mouth daily. (Patient not taking: Reported on 08/06/2022), Disp: , Rfl:    sertraline (ZOLOFT) 25 MG tablet, Take 25 mg by mouth daily., Disp: , Rfl:    tamsulosin (FLOMAX) 0.4 MG CAPS capsule, Take 1 capsule (0.4 mg total) by mouth daily., Disp: 30 capsule, Rfl: 0   traZODone (DESYREL) 50 MG tablet, Take 1 tablet (50 mg total) by mouth at bedtime., Disp: 30 tablet, Rfl: 0   zinc sulfate 220 (50 Zn) MG capsule, Take 1 capsule (220 mg total) by mouth daily. (Patient not taking: Reported on 08/06/2022), Disp: 30 capsule, Rfl: 0  Physical exam:  Vitals:   09/24/22 1018  Weight: 132 lb (59.9 kg)   Physical Exam Constitutional:      Comments: Sitting in a wheelchair.  Appears in no acute distress  Eyes:     Pupils: Pupils are equal, round, and reactive to light.  Cardiovascular:     Rate and Rhythm: Normal rate and regular rhythm.     Heart  sounds: Normal heart sounds.  Pulmonary:     Effort: Pulmonary effort is normal.     Breath sounds: Normal breath sounds.  Abdominal:     General: Bowel sounds are normal.     Palpations: Abdomen is soft.  Musculoskeletal:     Cervical back: Normal range of motion.  Skin:    General: Skin is warm and dry.  Neurological:     Mental Status: He is alert and oriented to person, place, and time.        Latest Ref Rng & Units 08/28/2022    3:20 PM  CMP  Glucose 70 - 99 mg/dL 97   BUN 8 - 23 mg/dL 17   Creatinine  0.61 - 1.24 mg/dL 1.11   Sodium 135 - 145 mmol/L 141   Potassium 3.5 - 5.1 mmol/L 4.4   Chloride 98 - 111 mmol/L 108   CO2 22 - 32 mmol/L 26   Calcium 8.9 - 10.3 mg/dL 9.8       Latest Ref Rng & Units 08/28/2022    3:20 PM  CBC  WBC 4.0 - 10.5 K/uL 5.7   Hemoglobin 13.0 - 17.0 g/dL 11.2   Hematocrit 39.0 - 52.0 % 34.8   Platelets 150 - 400 K/uL 238      Assessment and plan- Patient is a 77 y.o. male here for routine follow-up of anemia of chronic disease  At baseline patient's hemoglobin runs between 10.5-11.5.  His most recent labs done a few days ago showed that his hemoglobin was 10.2.  Iron studies B12 folate TSH have been unremarkable.  Myeloma panel showed no M protein and serum free light chain showed mildly abnormal kappa light chain ratio of 3.1 which I will continue to monitor.  This does not require a bone marrow biopsy at this time.  Will repeat CBC ferritin and iron studies in 3 and 6 months and I will see him back in 6 months.  Free light chains to be repeated in 6 months.  I suspect his anemia is secondary to chronic disease.  It was down to 7.6 when he was admitted for intra-abdominal abscess but has improved since then.   Visit Diagnosis 1. Anemia of chronic disease      Dr. Randa Evens, MD, MPH [facility] [phone] 09/24/2022 10:21 AM

## 2022-09-24 NOTE — Progress Notes (Signed)
Patient has no concerns 

## 2022-12-24 ENCOUNTER — Inpatient Hospital Stay: Payer: Medicare Other | Attending: Oncology

## 2023-03-13 ENCOUNTER — Observation Stay
Admission: EM | Admit: 2023-03-13 | Discharge: 2023-03-14 | Disposition: A | Discharge status: Home / Self Care | Payer: No Typology Code available for payment source | Attending: Internal Medicine | Admitting: Internal Medicine

## 2023-03-13 ENCOUNTER — Other Ambulatory Visit: Payer: Self-pay

## 2023-03-13 ENCOUNTER — Observation Stay: Payer: No Typology Code available for payment source

## 2023-03-13 DIAGNOSIS — Z87891 Personal history of nicotine dependence: Secondary | ICD-10-CM | POA: Insufficient documentation

## 2023-03-13 DIAGNOSIS — Z933 Colostomy status: Secondary | ICD-10-CM | POA: Diagnosis not present

## 2023-03-13 DIAGNOSIS — I5032 Chronic diastolic (congestive) heart failure: Secondary | ICD-10-CM | POA: Diagnosis not present

## 2023-03-13 DIAGNOSIS — F0280 Dementia in other diseases classified elsewhere without behavioral disturbance: Secondary | ICD-10-CM | POA: Diagnosis present

## 2023-03-13 DIAGNOSIS — Z96642 Presence of left artificial hip joint: Secondary | ICD-10-CM | POA: Insufficient documentation

## 2023-03-13 DIAGNOSIS — N1831 Chronic kidney disease, stage 3a: Secondary | ICD-10-CM | POA: Diagnosis not present

## 2023-03-13 DIAGNOSIS — N1832 Chronic kidney disease, stage 3b: Secondary | ICD-10-CM | POA: Diagnosis present

## 2023-03-13 DIAGNOSIS — I5A Non-ischemic myocardial injury (non-traumatic): Secondary | ICD-10-CM | POA: Diagnosis not present

## 2023-03-13 DIAGNOSIS — F419 Anxiety disorder, unspecified: Secondary | ICD-10-CM | POA: Diagnosis present

## 2023-03-13 DIAGNOSIS — I130 Hypertensive heart and chronic kidney disease with heart failure and stage 1 through stage 4 chronic kidney disease, or unspecified chronic kidney disease: Secondary | ICD-10-CM | POA: Insufficient documentation

## 2023-03-13 DIAGNOSIS — G3183 Dementia with Lewy bodies: Secondary | ICD-10-CM | POA: Insufficient documentation

## 2023-03-13 DIAGNOSIS — Z79899 Other long term (current) drug therapy: Secondary | ICD-10-CM | POA: Insufficient documentation

## 2023-03-13 DIAGNOSIS — F32A Depression, unspecified: Secondary | ICD-10-CM | POA: Diagnosis present

## 2023-03-13 DIAGNOSIS — N183 Chronic kidney disease, stage 3 unspecified: Secondary | ICD-10-CM | POA: Diagnosis present

## 2023-03-13 DIAGNOSIS — R55 Syncope and collapse: Principal | ICD-10-CM | POA: Diagnosis present

## 2023-03-13 DIAGNOSIS — N400 Enlarged prostate without lower urinary tract symptoms: Secondary | ICD-10-CM | POA: Diagnosis present

## 2023-03-13 DIAGNOSIS — I10 Essential (primary) hypertension: Secondary | ICD-10-CM | POA: Diagnosis not present

## 2023-03-13 HISTORY — DX: Unspecified intracranial injury with loss of consciousness status unknown, initial encounter: S06.9XAA

## 2023-03-13 LAB — URINALYSIS, ROUTINE W REFLEX MICROSCOPIC
Bilirubin Urine: NEGATIVE
Glucose, UA: NEGATIVE mg/dL
Hgb urine dipstick: NEGATIVE
Ketones, ur: NEGATIVE mg/dL
Leukocytes,Ua: NEGATIVE
Nitrite: NEGATIVE
Protein, ur: NEGATIVE mg/dL
Specific Gravity, Urine: 1.011 (ref 1.005–1.030)
pH: 5 (ref 5.0–8.0)

## 2023-03-13 LAB — TROPONIN I (HIGH SENSITIVITY)
Troponin I (High Sensitivity): 23 ng/L — ABNORMAL HIGH (ref <18)
Troponin I (High Sensitivity): 26 ng/L — ABNORMAL HIGH (ref <18)
Troponin I (High Sensitivity): 32 ng/L — ABNORMAL HIGH (ref <18)
Troponin I (High Sensitivity): 30 ng/L — ABNORMAL HIGH (ref <18)

## 2023-03-13 LAB — LIPID PANEL
Cholesterol: 206 mg/dL — ABNORMAL HIGH (ref 0–200)
HDL: 73 mg/dL (ref >40)
LDL Cholesterol: 117 mg/dL — ABNORMAL HIGH (ref 0–99)
Total CHOL/HDL Ratio: 2.8 RATIO
Triglycerides: 81 mg/dL (ref <150)
VLDL: 16 mg/dL (ref 0–40)

## 2023-03-13 LAB — HEMOGLOBIN A1C
Hgb A1c MFr Bld: 5.6 % (ref 4.8–5.6)
Mean Plasma Glucose: 114.02 mg/dL

## 2023-03-13 LAB — BASIC METABOLIC PANEL
Anion gap: 4 — ABNORMAL LOW (ref 5–15)
BUN: 13 mg/dL (ref 8–23)
CO2: 25 mmol/L (ref 22–32)
Calcium: 9.7 mg/dL (ref 8.9–10.3)
Chloride: 113 mmol/L — ABNORMAL HIGH (ref 98–111)
Creatinine, Ser: 1.47 mg/dL — ABNORMAL HIGH (ref 0.61–1.24)
GFR, Estimated: 49 mL/min — ABNORMAL LOW (ref >60)
Glucose, Bld: 77 mg/dL (ref 70–99)
Potassium: 4 mmol/L (ref 3.5–5.1)
Sodium: 142 mmol/L (ref 135–145)

## 2023-03-13 LAB — CBC
HCT: 38.6 % — ABNORMAL LOW (ref 39.0–52.0)
Hemoglobin: 12.4 g/dL — ABNORMAL LOW (ref 13.0–17.0)
MCH: 27.9 pg (ref 26.0–34.0)
MCHC: 32.1 g/dL (ref 30.0–36.0)
MCV: 86.7 fL (ref 80.0–100.0)
Platelets: 153 10*3/uL (ref 150–400)
RBC: 4.45 MIL/uL (ref 4.22–5.81)
RDW: 14.1 % (ref 11.5–15.5)
WBC: 5.5 10*3/uL (ref 4.0–10.5)
nRBC: 0 % (ref 0.0–0.2)

## 2023-03-13 LAB — BRAIN NATRIURETIC PEPTIDE: B Natriuretic Peptide: 43.1 pg/mL (ref 0.0–100.0)

## 2023-03-13 MED ORDER — MELATONIN 5 MG PO TABS
5.0000 mg | ORAL_TABLET | Freq: Every day | ORAL | Status: DC
  Administered 2023-03-13: 5 mg via ORAL
  Filled 2023-03-13: qty 1

## 2023-03-13 MED ORDER — LORATADINE 10 MG PO TABS
10.0000 mg | ORAL_TABLET | Freq: Every day | ORAL | Status: DC
  Administered 2023-03-13 – 2023-03-14 (×2): 10 mg via ORAL
  Filled 2023-03-13 (×2): qty 1

## 2023-03-13 MED ORDER — ZINC SULFATE 220 (50 ZN) MG PO CAPS
220.0000 mg | ORAL_CAPSULE | Freq: Every day | ORAL | Status: DC
  Administered 2023-03-13 – 2023-03-14 (×2): 220 mg via ORAL
  Filled 2023-03-13 (×2): qty 1

## 2023-03-13 MED ORDER — ADULT MULTIVITAMIN W/MINERALS CH
1.0000 | ORAL_TABLET | Freq: Every day | ORAL | Status: DC
  Administered 2023-03-13 – 2023-03-14 (×2): 1 via ORAL
  Filled 2023-03-13 (×2): qty 1

## 2023-03-13 MED ORDER — ENOXAPARIN SODIUM 40 MG/0.4ML IJ SOSY: 40.0000 mg | PREFILLED_SYRINGE | INTRAMUSCULAR | Status: DC

## 2023-03-13 MED ORDER — SODIUM CHLORIDE 0.9 % IV SOLN: INTRAVENOUS | Status: DC

## 2023-03-13 MED ORDER — ASPIRIN 81 MG PO TBEC
81.0000 mg | DELAYED_RELEASE_TABLET | Freq: Every day | ORAL | Status: DC
  Administered 2023-03-13 – 2023-03-14 (×2): 81 mg via ORAL
  Filled 2023-03-13 (×2): qty 1

## 2023-03-13 MED ORDER — FINASTERIDE 5 MG PO TABS
5.0000 mg | ORAL_TABLET | Freq: Every day | ORAL | Status: DC
  Administered 2023-03-13 – 2023-03-14 (×2): 5 mg via ORAL
  Filled 2023-03-13 (×3): qty 1

## 2023-03-13 MED ORDER — LACTATED RINGERS IV BOLUS
1000.0000 mL | Freq: Once | INTRAVENOUS | Status: AC
  Administered 2023-03-13: 1000 mL via INTRAVENOUS

## 2023-03-13 MED ORDER — LORAZEPAM 2 MG/ML IJ SOLN: 2.0000 mg | INTRAMUSCULAR | Status: DC | PRN

## 2023-03-13 MED ORDER — RIVASTIGMINE 4.6 MG/24HR TD PT24
4.6000 mg | MEDICATED_PATCH | Freq: Every day | TRANSDERMAL | Status: DC
  Administered 2023-03-13 – 2023-03-14 (×2): 4.6 mg via TRANSDERMAL
  Filled 2023-03-13 (×2): qty 1

## 2023-03-13 MED ORDER — ESCITALOPRAM OXALATE 10 MG PO TABS
5.0000 mg | ORAL_TABLET | Freq: Every day | ORAL | Status: DC
  Administered 2023-03-13 – 2023-03-14 (×2): 5 mg via ORAL
  Filled 2023-03-13 (×2): qty 1

## 2023-03-13 MED ORDER — TAMSULOSIN HCL 0.4 MG PO CAPS
0.4000 mg | ORAL_CAPSULE | Freq: Every day | ORAL | Status: DC
  Administered 2023-03-13 – 2023-03-14 (×2): 0.4 mg via ORAL
  Filled 2023-03-13 (×2): qty 1

## 2023-03-13 NOTE — ED Triage Notes (Signed)
Pt to ED [facility] from home for syncopal episode. Was caught and lowered to ground by home health RN. Pt endorses feeling dizzy since this morning. Did not hit head. No pain. Pt alert, oriented, NAD.

## 2023-03-13 NOTE — H&P (Signed)
History and Physical    Carl Figueroa JXB:[id number] DOB: 01-Jul-1946 DOA: 03/13/2023  Referring MD/NP/PA:   PCP: Mick Sell, MD   Patient coming from:  The patient is coming from home.     Chief Complaint: syncope  HPI: Carl Figueroa is a 77 y.o. male with medical history significant of HTN, dCHF, Lewy body dementia, depression with anxiety, BPH, CKD-3A, traumatic brain injury, abdominal abscess (s/p of colostomy), who presents with syncope.  Per his wife (I called his wife by phone), patient passed out in the early morning at about 9 AM when he went to the bathroom.  He passed out for less than 1 minutes.  He possibly hit his head.  Wife states that the patient had body shaking, with eyes rolling back, but no confusion after the event.  Wife states that the patient had had similar event in the past which was due to UTI. When I saw patient in ED, patient is alert, oriented to the place and person, but confused about the year 2024.  Per his wife, this is his baseline mental status.  Patient does not have unilateral numbness or tinglings in extremities.  No facial droop or slurred speech.  No chest pain, SOB and cough.  No fever or chills.  Denies nausea, vomiting, diarrhea or abdominal pain.  No symptoms of UTI.  Data reviewed independently and ED Course: pt was found to have WBC 5.5, trop 23 --> 32, BNP 43.1, negative urinalysis, renal function close to baseline, temperature normal, blood pressure 119/58, heart rate 60, RR 16, oxygen saturation 100% on room air.  Patient is placed on telemetry bed for observation.  EKG: I have personally reviewed.  Sinus rhythm, QTc 401, J-point elevation in V3-V4 which is similar to previous EKG on 08/06/2022.   Review of Systems:   General: no fevers, chills, no body weight gain, has fatigue HEENT: no blurry vision, hearing changes or sore throat Respiratory: no dyspnea, coughing, wheezing CV: no chest pain, no palpitations GI: no  nausea, vomiting, abdominal pain, diarrhea, constipation. Has colostomy GU: no dysuria, burning on urination, increased urinary frequency, hematuria  Ext: no leg edema Neuro: no unilateral weakness, numbness, or tingling, no vision change or hearing loss. has syncope. Skin: no rash, no skin tear. MSK: No muscle spasm, no deformity, no limitation of range of movement in spin Heme: No easy bruising.  Travel history: No recent long distant travel.   Allergy:  Allergies  Allergen Reactions   Morphine Nausea Only    Past Medical History:  Diagnosis Date   Anxiety    Arthritis    Hypertension    Lewy body dementia (HCC)    TBI (traumatic brain injury) (HCC)     Past Surgical History:  Procedure Laterality Date   COLON SURGERY     COLONOSCOPY     COLOSTOMY Right    FOOT SURGERY     X2   HERNIA REPAIR Right    INGUINAL    IR RADIOLOGIST EVAL & MGMT  08/08/2022   perforated bowel  05/2022   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   Procedure: TOTAL HIP ARTHROPLASTY ANTERIOR APPROACH;  Surgeon: Kennedy Bucker, MD;  Location: [facility] ORS;  Service: Orthopedics;  Laterality: Left;    Social History:  reports that he quit smoking about 43 years ago. His smoking use included cigarettes. He started smoking about 63 years ago. He has a 40 pack-year smoking history. He has never used smokeless tobacco. He reports that  he does not drink alcohol and does not use drugs.  Family History:  Family History  Problem Relation Age of Onset   Cancer Mother    Diabetes Mellitus II Neg Hx      Prior to Admission medications   Medication Sig Start Date End Date Taking? Authorizing Provider  ascorbic acid (VITAMIN C) 500 MG tablet Take 1 tablet (500 mg total) by mouth 2 (two) times daily. Patient not taking: Reported on 09/24/2022 07/26/22   Alford Highland, MD  feeding supplement (ENSURE ENLIVE / ENSURE PLUS) LIQD Take 237 mLs by mouth 3 (three) times daily between meals. 07/26/22   Alford Highland,  MD  finasteride (PROSCAR) 5 MG tablet Take 1 tablet (5 mg total) by mouth daily. 08/09/22   Alford Highland, MD  Multiple Vitamin (MULTIVITAMIN WITH MINERALS) TABS tablet Take 1 tablet by mouth daily. 07/27/22   Alford Highland, MD  sertraline (ZOLOFT) 25 MG tablet Take 25 mg by mouth daily.    [provider]  tamsulosin (FLOMAX) 0.4 MG CAPS capsule Take 1 capsule (0.4 mg total) by mouth daily. 08/09/22   Alford Highland, MD  traZODone (DESYREL) 50 MG tablet Take 1 tablet (50 mg total) by mouth at bedtime. 08/09/22   Alford Highland, MD  zinc sulfate 220 (50 Zn) MG capsule Take 1 capsule (220 mg total) by mouth daily. 07/27/22   Alford Highland, MD    Physical Exam: Vitals:   03/13/23 1036 03/13/23 1038 03/13/23 1347  BP: (!) 119/58  137/63  Pulse: 60  68  Resp: 16  14  Temp: 97.7 F (36.5 C)    TempSrc: Oral    SpO2: 100%  99%  Weight:  72.6 kg   Height:  5\' 7"  (1.702 m)    General: Not in acute distress HEENT:       Eyes: PERRL, EOMI, no jaundice       ENT: No discharge from the ears and nose, no pharynx injection, no tonsillar enlargement.        Neck: No JVD, no bruit, no mass felt. Heme: No neck lymph node enlargement. Cardiac: S1/S2, RRR, No murmurs, No gallops or rubs. Respiratory: No rales, wheezing, rhonchi or rubs. GI: Soft, nondistended, nontender, no rebound pain, no organomegaly, BS present. S/p of colostomy GU: No hematuria Ext: No pitting leg edema bilaterally. 1+DP/PT pulse bilaterally. Musculoskeletal: No joint deformities, No joint redness or warmth, no limitation of ROM in spin. Skin: No rashes.  Neuro: Alert, oriented to place and person, confused about the year 2024, cranial nerves II-XII grossly intact, moves all extremities normally. Muscle strength 5/5 in all extremities, sensation to light touch intact.  Psych: Patient is not psychotic, no suicidal or hemocidal ideation.  Labs on Admission: I have personally reviewed following labs and  imaging studies  CBC: Recent Labs  Lab 03/13/23 1045  WBC 5.5  HGB 12.4*  HCT 38.6*  MCV 86.7  PLT 153   Basic Metabolic Panel: Recent Labs  Lab 03/13/23 1122  NA 142  K 4.0  CL 113*  CO2 25  GLUCOSE 77  BUN 13  CREATININE 1.47*  CALCIUM 9.7   GFR: Estimated Creatinine Clearance: 39.3 mL/min (A) (by C-G formula based on SCr of 1.47 mg/dL (H)). Liver Function Tests: No results for input(s): "AST", "ALT", "ALKPHOS", "BILITOT", "PROT", "ALBUMIN" in the last 168 hours. No results for input(s): "LIPASE", "AMYLASE" in the last 168 hours. No results for input(s): "AMMONIA" in the last 168 hours. Coagulation Profile: No results for  input(s): "INR", "PROTIME" in the last 168 hours. Cardiac Enzymes: No results for input(s): "CKTOTAL", "CKMB", "CKMBINDEX", "TROPONINI" in the last 168 hours. BNP (last 3 results) No results for input(s): "PROBNP" in the last 8760 hours. HbA1C: No results for input(s): "HGBA1C" in the last 72 hours. CBG: No results for input(s): "GLUCAP" in the last 168 hours. Lipid Profile: No results for input(s): "CHOL", "HDL", "LDLCALC", "TRIG", "CHOLHDL", "LDLDIRECT" in the last 72 hours. Thyroid Function Tests: No results for input(s): "TSH", "T4TOTAL", "FREET4", "T3FREE", "THYROIDAB" in the last 72 hours. Anemia Panel: No results for input(s): "VITAMINB12", "FOLATE", "FERRITIN", "TIBC", "IRON", "RETICCTPCT" in the last 72 hours. Urine analysis:    Component Value Date/Time   COLORURINE YELLOW (A) 03/13/2023 1046   APPEARANCEUR CLEAR (A) 03/13/2023 1046   LABSPEC 1.011 03/13/2023 1046   PHURINE 5.0 03/13/2023 1046   GLUCOSEU NEGATIVE 03/13/2023 1046   HGBUR NEGATIVE 03/13/2023 1046   BILIRUBINUR NEGATIVE 03/13/2023 1046   KETONESUR NEGATIVE 03/13/2023 1046   PROTEINUR NEGATIVE 03/13/2023 1046   NITRITE NEGATIVE 03/13/2023 1046   LEUKOCYTESUR NEGATIVE 03/13/2023 1046   Sepsis Labs: @LABRCNTIP (procalcitonin:4,lacticidven:4) )No results found for  this or any previous visit (from the past 240 hour(s)).   Radiological Exams on Admission: No results found.    Assessment/Plan Principal Problem:   Syncope Active Problems:   Myocardial injury   Hypertension   Chronic diastolic CHF (congestive heart failure) (HCC)   Lewy body dementia (HCC)   Chronic kidney disease, stage 3a (HCC)   BPH (benign prostatic hyperplasia)   Anxiety and depression   Assessment and Plan:  Syncope: Etiology is not clear.  Potential differential diagnosis include TIA versus CVA seizure.  No focal neurodeficit on physical examination.  -Placed on telemetry bed of observation -Frequent neurocheck -PT/OT -Seizure precaution -.prn ativan for Seizure -Orthostatic vital signs  - MRI-brain - EEG - 2d echo - Neuro checks  - start ASA 81 mg daily - IVF: 1L of LR, then NS 75 cc/h  Myocardial injury: trop 23 --> 32. No CP -ASA -check A1c and FLP -trop trop  Hypertension: Patient is not taking medications currently -IV hydralazine as needed  Chronic diastolic CHF (congestive heart failure) (HCC): 2D echo 07/07/2019 showed EF of 60 to 65% with grade 1 diastolic dysfunction.  Patient does not have leg edema or JVD.  BNP is 43.1, CHF is compensated. -Watch volume status closely  Lewy body dementia (HCC) -Fall precaution -[employer]  Chronic kidney disease, stage 3a (HCC): Baseline creatinine 1.1-1.4.  His creatinine is 1.47, BUN 13, GFR 49, close to baseline. -f/u with BMP  BPH (benign prostatic hyperplasia) -Flomax and Proscar  Anxiety and depression -Lexapro       DVT ppx: SCD (pending MRI of brain  Code Status: Full code   Family Communication: Yes, patient's wife  by phone  Disposition Plan:  Anticipate discharge back to previous environment  Consults called:  none  Admission status and Level of care: Telemetry Medical:    for obs  Dispo: The patient is from: Home              Anticipated d/c is to: Home               Anticipated d/c date is: 1 day              Patient currently is not medically stable to d/c.    Severity of Illness:  The appropriate patient status for this patient is INPATIENT. Inpatient status is judged to  be reasonable and necessary in order to provide the required intensity of service to ensure the patient's safety. The patient's presenting symptoms, physical exam findings, and initial radiographic and laboratory data in the context of their chronic comorbidities is felt to place them at high risk for further clinical deterioration. Furthermore, it is not anticipated that the patient will be medically stable for discharge from the hospital within 2 midnights of admission.   * I certify that at the point of admission it is my clinical judgment that the patient will require inpatient hospital care spanning beyond 2 midnights from the point of admission due to high intensity of service, high risk for further deterioration and high frequency of surveillance required.*       Date of Service 03/13/2023    Lorretta Harp [facility]   If 7PM-7AM, please contact night-coverage [email or web address] 03/13/2023, 1:58 PM

## 2023-03-13 NOTE — ED Notes (Signed)
Pt ate [employer] lunch provided by his wife

## 2023-03-13 NOTE — ED Provider Notes (Signed)
[facility] Provider Note    Event Date/Time   First MD Initiated Contact with Patient 03/13/23 1150     (approximate)   History   Chief Complaint Loss of Consciousness   HPI  Carl Figueroa is a 77 y.o. male with past medical history of hypertension, CKD, and Lewy body dementia who presents to the ED complaining of syncope.  Patient reports that he was walking to the bathroom at home when he suddenly began to feel lightheaded like he was going to pass out.  His home health nurse was able to catch him before he fell to the ground, but he does not remember exactly what happened.  He denies any associated chest pain or shortness of breath, states he continues to feel slightly weak and lightheaded.  He denies any history of similar symptoms, denies any cardiac history.  He denies any recent fevers, cough, nausea, vomiting, or diarrhea.     Physical Exam   Triage Vital Signs: ED Triage Vitals  Encounter Vitals Group     BP 03/13/23 1036 (!) 119/58     Systolic BP Percentile --      Diastolic BP Percentile --      Pulse Rate 03/13/23 1036 60     Resp 03/13/23 1036 16     Temp 03/13/23 1036 97.7 F (36.5 C)     Temp Source 03/13/23 1036 Oral     SpO2 03/13/23 1036 100 %     Weight 03/13/23 1038 160 lb (72.6 kg)     Height 03/13/23 1038 5\' 7"  (1.702 m)     Head Circumference --      Peak Flow --      Pain Score 03/13/23 1037 0     Pain Loc --      Pain Education --      Exclude from Growth Chart --     Most recent vital signs: Vitals:   03/13/23 1036 03/13/23 1347  BP: (!) 119/58 137/63  Pulse: 60 68  Resp: 16 14  Temp: 97.7 F (36.5 C)   SpO2: 100% 99%    Constitutional: Awake and alert. Eyes: Conjunctivae are normal. Head: Atraumatic. Nose: No congestion/rhinnorhea. Mouth/Throat: Mucous membranes are moist.  Cardiovascular: Normal rate, regular rhythm. Grossly normal heart sounds.  2+ radial pulses bilaterally. Respiratory:  Normal respiratory effort.  No retractions. Lungs CTAB. Gastrointestinal: Soft and nontender. No distention. Musculoskeletal: No lower extremity tenderness nor edema.  Neurologic:  Normal speech and language. No gross focal neurologic deficits are appreciated.    ED Results / Procedures / Treatments   Labs (all labs ordered are listed, but only abnormal results are displayed) Labs Reviewed  CBC - Abnormal; Notable for the following components:      Result Value   Hemoglobin 12.4 (*)    HCT 38.6 (*)    All other components within normal limits  URINALYSIS, ROUTINE W REFLEX MICROSCOPIC - Abnormal; Notable for the following components:   Color, Urine YELLOW (*)    APPearance CLEAR (*)    All other components within normal limits  BASIC METABOLIC PANEL - Abnormal; Notable for the following components:   Chloride 113 (*)    Creatinine, Ser 1.47 (*)    GFR, Estimated 49 (*)    Anion gap 4 (*)    All other components within normal limits  TROPONIN I (HIGH SENSITIVITY) - Abnormal; Notable for the following components:   Troponin I (High Sensitivity) 23 (*)  All other components within normal limits  BRAIN NATRIURETIC PEPTIDE  CBG MONITORING, ED     EKG  ED ECG REPORT I, Chesley Noon, the attending physician, personally viewed and interpreted this ECG.   Date: 03/13/2023  EKG Time: 10:44  Rate: 62  Rhythm: normal sinus rhythm  Axis: Normal  Intervals:none  ST&T Change: Nonspecific T wave abnormality  PROCEDURES:  Critical Care performed: No  Procedures   MEDICATIONS ORDERED IN ED: Medications  0.9 %  sodium chloride infusion (has no administration in time range)  lactated ringers bolus 1,000 mL (1,000 mLs Intravenous New Bag/Given 03/13/23 1242)     IMPRESSION / MDM / ASSESSMENT AND PLAN / ED COURSE  I reviewed the triage vital signs and the nursing notes.                              77 y.o. male with past medical history of hypertension, CKD, and Lewy  body dementia who presents to the ED following syncopal episode at home while attempting to walk to the bathroom.  Patient's presentation is most consistent with acute presentation with potential threat to life or bodily function.  Differential diagnosis includes, but is not limited to, arrhythmia, ACS, dehydration, vasovagal episode, orthostatic hypotension, anemia, electrolyte abnormality, AKI.  Patient nontoxic-appearing and in no acute distress, vital signs are unremarkable.  EKG shows no evidence of arrhythmia or ischemia and initial labs are reassuring with no significant anemia or leukocytosis.  Patient does have mild AKI with no acute electrolyte abnormality, troponin pending at this time.  We will hydrate with IV fluids and observe on cardiac monitor.  No events noted on cardiac monitor, troponin mildly elevated but similar to previous.  Given concern for cardiac etiology for syncope, case discussed with hospitalist for admission.      FINAL CLINICAL IMPRESSION(S) / ED DIAGNOSES   Final diagnoses:  Syncope and collapse     Rx / DC Orders   ED Discharge Orders     None        Note:  This document was prepared using Dragon voice recognition software and may include unintentional dictation errors.   Chesley Noon, MD 03/13/23 1352

## 2023-03-13 NOTE — ED Triage Notes (Signed)
First nurse note: Patient arrived by EMS from home.   Home health RN reports near syncope. Denies fall. Wife reports she was sick last week.  EMS vitals: 58HR 136/80 b/p 99% RA 97CBG  History TBI and colostomy bag.

## 2023-03-14 ENCOUNTER — Ambulatory Visit: Payer: Medicare Other

## 2023-03-14 ENCOUNTER — Observation Stay
Admit: 2023-03-14 | Discharge: 2023-03-14 | Disposition: A | Discharge status: Home / Self Care | Payer: No Typology Code available for payment source | Attending: Internal Medicine | Admitting: Internal Medicine

## 2023-03-14 DIAGNOSIS — R569 Unspecified convulsions: Secondary | ICD-10-CM | POA: Diagnosis not present

## 2023-03-14 DIAGNOSIS — R55 Syncope and collapse: Secondary | ICD-10-CM

## 2023-03-14 DIAGNOSIS — I5032 Chronic diastolic (congestive) heart failure: Secondary | ICD-10-CM | POA: Diagnosis not present

## 2023-03-14 DIAGNOSIS — N1831 Chronic kidney disease, stage 3a: Secondary | ICD-10-CM | POA: Diagnosis not present

## 2023-03-14 LAB — BASIC METABOLIC PANEL
Anion gap: 4 — ABNORMAL LOW (ref 5–15)
BUN: 14 mg/dL (ref 8–23)
CO2: 26 mmol/L (ref 22–32)
Creatinine, Ser: 1.4 mg/dL — ABNORMAL HIGH (ref 0.61–1.24)
GFR, Estimated: 52 mL/min — ABNORMAL LOW (ref >60)
Glucose, Bld: 91 mg/dL (ref 70–99)
Potassium: 4 mmol/L (ref 3.5–5.1)
Sodium: 142 mmol/L (ref 135–145)

## 2023-03-14 LAB — ECHOCARDIOGRAM COMPLETE
AR max vel: 3.03 cm2
AV Area VTI: 3.42 cm2
AV Area mean vel: 3.32 cm2
AV Peak grad: 5 mmHg
Ao pk vel: 1.12 m/s
Area-P 1/2: 3.4 cm2
Height: 67 in
MV VTI: 2.01 cm2
S' Lateral: 2.4 cm
Weight: 2377.44 oz

## 2023-03-14 LAB — CBC
HCT: 33 % — ABNORMAL LOW (ref 39.0–52.0)
Hemoglobin: 10.9 g/dL — ABNORMAL LOW (ref 13.0–17.0)
MCH: 28.2 pg (ref 26.0–34.0)
MCHC: 33 g/dL (ref 30.0–36.0)
MCV: 85.3 fL (ref 80.0–100.0)
Platelets: 141 10*3/uL — ABNORMAL LOW (ref 150–400)
RBC: 3.87 MIL/uL — ABNORMAL LOW (ref 4.22–5.81)
RDW: 13.5 % (ref 11.5–15.5)
WBC: 5.1 10*3/uL (ref 4.0–10.5)
nRBC: 0 % (ref 0.0–0.2)

## 2023-03-14 MED ORDER — ENOXAPARIN SODIUM 40 MG/0.4ML IJ SOSY: 40.0000 mg | PREFILLED_SYRINGE | INTRAMUSCULAR | Status: DC

## 2023-03-14 NOTE — Discharge Summary (Signed)
Physician Discharge Summary   Patient: Carl Figueroa MRN: [id number] DOB: 13-Jun-1946  Admit date:     03/13/2023  Discharge date: 03/14/2023  Discharge Physician: Carl Figueroa   PCP: Carl Sell, MD   Recommendations at discharge:   Follow up with Primary Care in 1-2 weeks Follow up with Neurology as scheduled or call if questions or concerns sooner Repeat CBC, BMP in 1-2 weeks Consider ambulatory cardiac monitor for 2-4 weeks if ongoing syncopal episodes  Discharge Diagnoses: Principal Problem:   Syncope Active Problems:   Myocardial injury   Hypertension   Chronic diastolic CHF (congestive heart failure) (HCC)   Lewy body dementia (HCC)   Chronic kidney disease, stage 3a (HCC)   BPH (benign prostatic hyperplasia)   Anxiety and depression  Resolved Problems:   * No resolved hospital problems. [facility] Course:  HPI on admission 03/13/2023 by Dr. Clyde Figueroa:  "Carl Figueroa is a 77 y.o. male with medical history significant of HTN, dCHF, Lewy body dementia, depression with anxiety, BPH, CKD-3A, traumatic brain injury, abdominal abscess (s/p of colostomy), who presents with syncope.   Per his wife (I called his wife by phone), patient passed out in the early morning at about 9 AM when he went to the bathroom.  He passed out for less than 1 minutes.  He possibly hit his head.  Wife states that the patient had body shaking, with eyes rolling back, but no confusion after the event.  Wife states that the patient had had similar event in the past which was due to UTI. When I saw patient in ED, patient is alert, oriented to the place and person, but confused about the year 2024.  Per his wife, this is his baseline mental status.  Patient does not have unilateral numbness or tinglings in extremities.  No facial droop or slurred speech.  No chest pain, SOB and cough.  No fever or chills.  Denies nausea, vomiting, diarrhea or abdominal pain.  No symptoms of UTI.   Data  reviewed independently and ED Course: pt was found to have WBC 5.5, trop 23 --> 32, BNP 43.1, negative urinalysis, renal function close to baseline, temperature normal, blood pressure 119/58, heart rate 60, RR 16, oxygen saturation 100% on room air.  Patient is placed on telemetry bed for observation."   03/14/23 -- pt doing well today, seen with wife at bedside.  He has no acute complaints.   Syncope work up is complete, without a specific etiology identified.  Evaluation is outlined below.  Wife reports patient was having these spells prior to being started on Exelon patch about six months ago. She states the spells are now less frequent than they were before starting Exelon.  Patient is medically stable for discharge home today.  He and wife are agreeable.   Assessment and Plan:  Syncope: Etiology is not clear.  Work up was unremarkable.  Suspect this is due to orthostatic episodes related to Lewy Body's impact on the autonomic nervous system versus vaso-vagal episodes.   -Neuro checks were unremarkable -PT/OT - HH recommended, ordered -EEG was negative for signs of epileptic / seizure activity, showed mild diffuse encephalopathy -Echocardiogram normal -Orthostatic vital sign were normal - MRI-brain - no acute findings, showed chronic age-related atrophy and small vessel ischemic disease Recommended to wife trying compression stockings / TED hose during the day at home, can help if having transient orthostatic episodes.   Myocardial injury: trop 23 --> 32. No Chest pain.  Hypertension: Patient is not taking medications currently. Stable.   Chronic diastolic CHF (congestive heart failure) (HCC): 2D echo 07/07/2019 showed EF of 60 to 65% with grade 1 diastolic dysfunction.  Patient does not have leg edema or JVD.  BNP is 43.1, CHF is compensated.   Lewy body dementia (HCC) -Continue Exelon -Follow up with Neurology as scheduled   Chronic kidney disease, stage 3a (HCC): Baseline  creatinine 1.1-1.4.   Stable Cr 1.47 on admission -Repeat BMP at follow up   BPH (benign prostatic hyperplasia) -Flomax and Proscar   Anxiety and depression -Lexapro       Consultants: None Procedures performed: Echo, EEG - see reports  Disposition: Home health Diet recommendation:  Discharge Diet Orders (From admission, onward)     Start     Ordered   03/14/23 0000  Diet - low sodium heart healthy        03/14/23 1340            DISCHARGE MEDICATION: Allergies as of 03/14/2023       Reactions   Morphine Nausea Only        Medication List     STOP taking these medications    ascorbic acid 500 MG tablet Commonly known as: VITAMIN C   sertraline 25 MG tablet Commonly known as: ZOLOFT   traZODone 50 MG tablet Commonly known as: DESYREL       TAKE these medications    escitalopram 5 MG tablet Commonly known as: LEXAPRO Take 5 mg by mouth daily.   feeding supplement Liqd Take 237 mLs by mouth 3 (three) times daily between meals.   finasteride 5 MG tablet Commonly known as: PROSCAR Take 1 tablet (5 mg total) by mouth daily.   loratadine 10 MG tablet Commonly known as: CLARITIN Take 10 mg by mouth daily.   Melatonin 5 MG Caps Take 1 capsule by mouth at bedtime.   multivitamin with minerals Tabs tablet Take 1 tablet by mouth daily.   rivastigmine 4.6 mg/24hr Commonly known as: EXELON Place 4.6 mg onto the skin daily.   tamsulosin 0.4 MG Caps capsule Commonly known as: FLOMAX Take 1 capsule (0.4 mg total) by mouth daily.   zinc sulfate 220 (50 Zn) MG capsule Take 1 capsule (220 mg total) by mouth daily.        Discharge Exam: Filed Weights   03/13/23 1038 03/14/23 0500  Weight: 72.6 kg 67.4 kg   General exam: awake, alert, no acute distress HEENT: atraumatic, clear conjunctiva, anicteric sclera, moist mucus membranes, hearing grossly normal  Respiratory system: CTAB, no wheezes, rales or rhonchi, normal respiratory  effort. Cardiovascular system: normal S1/S2, RRR, no pedal edema.   Gastrointestinal system: soft, NT, ND, no HSM felt, +bowel sounds. Central nervous system: A&O x 3. no gross focal neurologic deficits, normal speech Extremities: moves all, no edema, normal tone Skin: dry, intact, normal temperature Psychiatry: normal mood, congruent affect, judgement and insight appear normal   Condition at discharge: stable  The results of significant diagnostics from this hospitalization (including imaging, microbiology, ancillary and laboratory) are listed below for reference.   Imaging Studies: ECHOCARDIOGRAM COMPLETE  Result Date: 03/14/2023    ECHOCARDIOGRAM REPORT   Patient Name:   ELIAB CLOSSON Date of Exam: 03/14/2023 Medical Rec #:  [id number]            Height:       67.0 in Accession #:    [id number]  Weight:       148.6 lb Date of Birth:  [birth year]            BSA:          1.782 m Patient Age:    77 years             BP:           124/65 mmHg Patient Gender: M                    HR:           59 bpm. Exam Location:  [facility] Procedure: 2D Echo, Cardiac Doppler and Color Doppler Indications:     Syncope R55  History:         Patient has prior history of Echocardiogram examinations, most                  recent 07/08/2019. Risk Factors:Hypertension.  Sonographer:     Cristela Blue Referring Phys:  [id number] Lorretta Harp Diagnosing Phys: Julien Nordmann MD  Sonographer Comments: Image acquisition challenging due to patient body habitus. IMPRESSIONS  1. Left ventricular ejection fraction, by estimation, is 60 to 65%. The left ventricle has normal function. The left ventricle has no regional wall motion abnormalities. There is moderate left ventricular hypertrophy. Left ventricular diastolic parameters are indeterminate.  2. Right ventricular systolic function is normal. The right ventricular size is normal.  3. The mitral valve is normal in structure. No evidence of mitral valve regurgitation. No evidence  of mitral stenosis.  4. The aortic valve is normal in structure. There is mild calcification of the aortic valve. Aortic valve regurgitation is not visualized. Aortic valve sclerosis/calcification is present, without any evidence of aortic stenosis. Aortic valve mean gradient measures 2.5 mmHg.  5. The inferior vena cava is normal in size with greater than 50% respiratory variability, suggesting right atrial pressure of 3 mmHg. FINDINGS  Left Ventricle: Left ventricular ejection fraction, by estimation, is 60 to 65%. The left ventricle has normal function. The left ventricle has no regional wall motion abnormalities. The left ventricular internal cavity size was normal in size. There is  moderate left ventricular hypertrophy. Left ventricular diastolic parameters are indeterminate. Right Ventricle: The right ventricular size is normal. No increase in right ventricular wall thickness. Right ventricular systolic function is normal. Left Atrium: Left atrial size was normal in size. Right Atrium: Right atrial size was normal in size. Pericardium: There is no evidence of pericardial effusion. Mitral Valve: The mitral valve is normal in structure. Mild mitral annular calcification. No evidence of mitral valve regurgitation. No evidence of mitral valve stenosis. MV peak gradient, 7.2 mmHg. The mean mitral valve gradient is 2.0 mmHg. Tricuspid Valve: The tricuspid valve is normal in structure. Tricuspid valve regurgitation is not demonstrated. No evidence of tricuspid stenosis. Aortic Valve: The aortic valve is normal in structure. There is mild calcification of the aortic valve. Aortic valve regurgitation is not visualized. Aortic valve sclerosis/calcification is present, without any evidence of aortic stenosis. Aortic valve mean gradient measures 2.5 mmHg. Aortic valve peak gradient measures 5.0 mmHg. Aortic valve area, by VTI measures 3.42 cm. Pulmonic Valve: The pulmonic valve was normal in structure. Pulmonic valve  regurgitation is not visualized. No evidence of pulmonic stenosis. Aorta: The aortic root is normal in size and structure. Venous: The inferior vena cava is normal in size with greater than 50% respiratory variability, suggesting right atrial pressure of 3 mmHg. IAS/Shunts: No atrial  level shunt detected by color flow Doppler.  LEFT VENTRICLE PLAX 2D LVIDd:         3.80 cm   Diastology LVIDs:         2.40 cm   LV e' medial:    6.42 cm/s LV PW:         1.10 cm   LV E/e' medial:  15.3 LV IVS:        1.40 cm   LV e' lateral:   9.68 cm/s LVOT diam:     2.00 cm   LV E/e' lateral: 10.1 LV SV:         76 LV SV Index:   43 LVOT Area:     3.14 cm  RIGHT VENTRICLE RV Basal diam:  4.30 cm RV Mid diam:    3.10 cm LEFT ATRIUM             Index        RIGHT ATRIUM           Index LA diam:        1.90 cm 1.07 cm/m   RA Area:     15.20 cm LA Vol (A2C):   19.5 ml 10.94 ml/m  RA Volume:   40.20 ml  22.55 ml/m LA Vol (A4C):   32.3 ml 18.12 ml/m LA Biplane Vol: 26.0 ml 14.59 ml/m  AORTIC VALVE AV Area (Vmax):    3.03 cm AV Area (Vmean):   3.32 cm AV Area (VTI):     3.42 cm AV Vmax:           112.00 cm/s AV Vmean:          77.950 cm/s AV VTI:            0.224 m AV Peak Grad:      5.0 mmHg AV Mean Grad:      2.5 mmHg LVOT Vmax:         108.00 cm/s LVOT Vmean:        82.300 cm/s LVOT VTI:          0.243 m LVOT/AV VTI ratio: 1.09  AORTA Ao Root diam: 2.70 cm MITRAL VALVE               TRICUSPID VALVE MV Area (PHT): 3.40 cm    TR Peak grad:   15.5 mmHg MV Area VTI:   2.01 cm    TR Vmax:        197.00 cm/s MV Peak grad:  7.2 mmHg MV Mean grad:  2.0 mmHg    SHUNTS MV Vmax:       1.34 m/s    Systemic VTI:  0.24 m MV Vmean:      59.4 cm/s   Systemic Diam: 2.00 cm MV Decel Time: 223 msec MV E velocity: 98.00 cm/s MV A velocity: 78.10 cm/s MV E/A ratio:  1.25 Julien Nordmann MD Electronically signed by Julien Nordmann MD Signature Date/Time: 03/14/2023/1:35:17 PM    Final    MR BRAIN WO CONTRAST  Result Date: 03/13/2023 CLINICAL  DATA:  Initial evaluation for syncope/presyncope. EXAM: MRI HEAD WITHOUT CONTRAST TECHNIQUE: Multiplanar, multiecho pulse sequences of the brain and surrounding structures were obtained without intravenous contrast. COMPARISON:  CT from 08/06/2022. FINDINGS: Brain: Diffuse prominence of the CSF containing spaces compatible generalized cerebral atrophy. Few scattered subcentimeter foci of T2/FLAIR hyperintensity noted involving the supratentorial cerebral white matter and pons, most likely related to chronic microvascular ischemic disease, mild for age. No evidence for acute  or subacute ischemia. Gray-white matter differentiation maintained. No areas of chronic cortical infarction. No acute or chronic intracranial blood products. No mass lesion, midline shift or mass effect. No hydrocephalus or extra-axial fluid collection. Pituitary gland suprasellar region within normal limits. No evidence Vascular: Major intracranial vascular flow voids are maintained. Skull and upper cervical spine: Cranial junction within normal limits. Bone marrow signal intensity normal. No scalp soft tissue abnormality. Sinuses/Orbits: Prior ocular lens replacement on the right. Paranasal sinuses are largely clear. No significant mastoid effusion. Other: None. IMPRESSION: 1. No acute intracranial abnormality. 2. Generalized age-related cerebral atrophy with mild chronic small vessel ischemic disease. Electronically Signed   By: Rise Mu M.D.   On: 03/13/2023 21:56    Microbiology: Results for orders placed or performed during the hospital encounter of 08/06/22  Resp panel by RT-PCR (RSV, Flu A&B, Covid) Anterior Nasal Swab     Status: None   Collection Time: 08/06/22  7:09 PM   Specimen: Anterior Nasal Swab  Result Value Ref Range Status   SARS Coronavirus 2 by RT PCR NEGATIVE NEGATIVE Final    Comment: (NOTE) SARS-CoV-2 target nucleic acids are NOT DETECTED.  The SARS-CoV-2 RNA is generally detectable in upper  respiratory specimens during the acute phase of infection. The lowest concentration of SARS-CoV-2 viral copies this assay can detect is 138 copies/mL. A negative result does not preclude SARS-Cov-2 infection and should not be used as the sole basis for treatment or other patient management decisions. A negative result may occur with  improper specimen collection/handling, submission of specimen other than nasopharyngeal swab, presence of viral mutation(s) within the areas targeted by this assay, and inadequate number of viral copies(<138 copies/mL). A negative result must be combined with clinical observations, patient history, and epidemiological information. The expected result is Negative.  Fact Sheet for Patients:  BloggerCourse.com  Fact Sheet for Healthcare Providers:  SeriousBroker.it  This test is no t yet approved or cleared by the [place] FDA and  has been authorized for detection and/or diagnosis of SARS-CoV-2 by FDA under an Emergency Use Authorization (EUA). This EUA will remain  in effect (meaning this test can be used) for the duration of the COVID-19 declaration under Section 564(b)(1) of the Act, 21 U.S.C.section 360bbb-3(b)(1), unless the authorization is terminated  or revoked sooner.       Influenza A by PCR NEGATIVE NEGATIVE Final   Influenza B by PCR NEGATIVE NEGATIVE Final    Comment: (NOTE) The Xpert Xpress SARS-CoV-2/FLU/RSV plus assay is intended as an aid in the diagnosis of influenza from Nasopharyngeal swab specimens and should not be used as a sole basis for treatment. Nasal washings and aspirates are unacceptable for Xpert Xpress SARS-CoV-2/FLU/RSV testing.  Fact Sheet for Patients: BloggerCourse.com  Fact Sheet for Healthcare Providers: SeriousBroker.it  This test is not yet approved or cleared by the [place] FDA and has been  authorized for detection and/or diagnosis of SARS-CoV-2 by FDA under an Emergency Use Authorization (EUA). This EUA will remain in effect (meaning this test can be used) for the duration of the COVID-19 declaration under Section 564(b)(1) of the Act, 21 U.S.C. section 360bbb-3(b)(1), unless the authorization is terminated or revoked.     Resp Syncytial Virus by PCR NEGATIVE NEGATIVE Final    Comment: (NOTE) Fact Sheet for Patients: BloggerCourse.com  Fact Sheet for Healthcare Providers: SeriousBroker.it  This test is not yet approved or cleared by the [place] FDA and has been authorized for detection and/or diagnosis of SARS-CoV-2  by FDA under an Emergency Use Authorization (EUA). This EUA will remain in effect (meaning this test can be used) for the duration of the COVID-19 declaration under Section 564(b)(1) of the Act, 21 U.S.C. section 360bbb-3(b)(1), unless the authorization is terminated or revoked.  Performed at [facility], [address] [place], [place] [address]   Blood Culture (routine x 2)     Status: None   Collection Time: 08/06/22  8:10 PM   Specimen: BLOOD LEFT ARM  Result Value Ref Range Status   Specimen Description BLOOD LEFT ARM  Final   Special Requests   Final    BOTTLES DRAWN AEROBIC AND ANAEROBIC Blood Culture results may not be optimal due to an inadequate volume of blood received in culture bottles   Culture   Final    NO GROWTH 5 DAYS Performed at [facility], [address] [place], [place] [address]    Report Status 08/11/2022 FINAL  Final  Blood Culture (routine x 2)     Status: None   Collection Time: 08/06/22  8:10 PM   Specimen: BLOOD  Result Value Ref Range Status   Specimen Description BLOOD BLOOD RIGHT ARM  Final   Special Requests   Final    BOTTLES DRAWN AEROBIC AND ANAEROBIC Blood Culture results may not be optimal due to an inadequate volume of blood  received in culture bottles   Culture   Final    NO GROWTH 5 DAYS Performed at [facility], [address] [place], [place] [address]    Report Status 08/11/2022 FINAL  Final  Urine Culture     Status: Abnormal   Collection Time: 08/07/22  3:32 PM   Specimen: Urine, Catheterized  Result Value Ref Range Status   Specimen Description   Final    URINE, CATHETERIZED Performed at [facility], [address] [place], [place] [address]    Special Requests   Final    NONE Performed at [facility], [address] [place], [place] [address]    Culture >=100,000 COLONIES/mL ENTEROBACTER AEROGENES (A)  Final   Report Status 08/10/2022 FINAL  Final   Organism ID, Bacteria ENTEROBACTER AEROGENES (A)  Final      Susceptibility   Enterobacter aerogenes - MIC*    CEFAZOLIN RESISTANT Resistant     CEFEPIME <=0.12 SENSITIVE Sensitive     CEFTRIAXONE <=0.25 SENSITIVE Sensitive     CIPROFLOXACIN <=0.25 SENSITIVE Sensitive     GENTAMICIN <=1 SENSITIVE Sensitive     IMIPENEM <=0.25 SENSITIVE Sensitive     NITROFURANTOIN 32 SENSITIVE Sensitive     TRIMETH/SULFA <=20 SENSITIVE Sensitive     PIP/TAZO 16 SENSITIVE Sensitive     * >=100,000 COLONIES/mL ENTEROBACTER AEROGENES  C Difficile Quick Screen w PCR reflex     Status: None   Collection Time: 08/08/22 11:19 AM   Specimen: STOOL  Result Value Ref Range Status   C Diff antigen NEGATIVE NEGATIVE Final   C Diff toxin NEGATIVE NEGATIVE Final   C Diff interpretation No C. difficile detected.  Final    Comment: Performed at [facility], [address] [place], [place] [address]    Labs: CBC: Recent Labs  Lab 03/13/23 1045 03/14/23 0632  WBC 5.5 5.1  HGB 12.4* 10.9*  HCT 38.6* 33.0*  MCV 86.7 85.3  PLT 153 141*   Basic Metabolic Panel: Recent Labs  Lab 03/13/23 1122 03/14/23 0632  NA 142 142  K 4.0 4.0  CL  113* 112*  CO2 25 26  GLUCOSE 77 91  BUN 13 14  CREATININE 1.47* 1.40*   CALCIUM 9.7 9.1   Liver Function Tests: No results for input(s): "AST", "ALT", "ALKPHOS", "BILITOT", "PROT", "ALBUMIN" in the last 168 hours. CBG: No results for input(s): "GLUCAP" in the last 168 hours.  Discharge time spent: greater than 30 minutes.  Signed: Pennie Banter, DO [facility] 03/14/2023

## 2023-03-14 NOTE — Evaluation (Addendum)
Physical Therapy Evaluation Patient Details Name: Carl Figueroa MRN: [id number] DOB: 1945/12/10 Today's Date: 03/14/2023  History of Present Illness  Pt is a 77 year old male presenting to the ED after a syncopal episode, etiology unclear; Pmh significant for HTN, dCHF, Lewy body dementia, depression with anxiety, BPH, CKD-3A, traumatic brain injury, abdominal abscess (s/p of colostomy)  Clinical Impression   Pt presents laying in bed, A&Ox2, not oriented to place or time. He currently lives with his wife in a 1 story home with 3 steps to enter and no rail. PTA he was ambulating around his home with a rollator and had a home health aide/ his wife assist with ADLs.   Pt was able to perform supine> sit and sit<>stand with RW and min guard for safety and verbal cueing for sequencing/hand placement. Pt was able to ambulate ~44ft with RW and CGA for safety. Shuffling gait, L lateral drifting, and decreased awareness of surroundings was noted. Pt would benefit from continued skilled therapy to maximize functional independence.        Assistance Recommended at Discharge Intermittent Supervision/Assistance  If plan is discharge home, recommend the following:  Can travel by private vehicle  Assist for transportation;Assistance with feeding;Direct supervision/assist for medications management;Direct supervision/assist for financial management;Assistance with cooking/housework;A lot of help with bathing/dressing/bathroom;A little help with walking and/or transfers;Help with stairs or ramp for entrance        Equipment Recommendations    Recommendations for Other Services       Functional Status Assessment Patient has had a recent decline in their functional status and demonstrates the ability to make significant improvements in function in a reasonable and predictable amount of time.     Precautions / Restrictions Precautions Precautions: Fall Precaution Comments: watch  orthostatics Restrictions Weight Bearing Restrictions: No      Mobility  Bed Mobility Overal bed mobility: Needs Assistance Bed Mobility: Supine to Sit     Supine to sit: Min guard     General bed mobility comments: Min guard for safety, required cueing for seqencing    Transfers Overall transfer level: Needs assistance Equipment used: Rolling walker (2 wheels) Transfers: Sit to/from Stand Sit to Stand: Min guard           General transfer comment: occasional verbal cueing for hand placement on RW    Ambulation/Gait Ambulation/Gait assistance: Min guard Gait Distance (Feet): 80 Feet Assistive device: Rolling walker (2 wheels) Gait Pattern/deviations: Shuffle Gait velocity: decreased     General Gait Details: drifting to the L, decreased awareness of surroundings  Stairs            Wheelchair Mobility     Tilt Bed    Modified Rankin (Stroke Patients Only)       Balance Overall balance assessment: Needs assistance Sitting-balance support: Feet supported, No upper extremity supported Sitting balance-Leahy Scale: Good     Standing balance support: Bilateral upper extremity supported, During functional activity, Reliant on assistive device for balance Standing balance-Leahy Scale: Fair Standing balance comment: some dizziness with initial standing but resolved quickly                             Pertinent Vitals/Pain Pain Assessment Pain Assessment: No/denies pain    Home Living Family/patient expects to be discharged to:: Private residence Living Arrangements: Spouse/significant other Available Help at Discharge: Family;Available 24 hours/day;Personal care attendant Type of Home: House Home Access: Stairs to enter Entrance Stairs-Rails:  None Entrance Stairs-Number of Steps: 3   Home Layout: One level Home Equipment: Rollator (4 wheels);Rolling Walker (2 wheels)      Prior Function Prior Level of Function : Needs assist        Physical Assist : ADLs (physical)   ADLs (physical): Dressing;Grooming;Bathing;Toileting Mobility Comments: Ambulates at home with rollator ADLs Comments: Wife and HH nurse assist with ADLs     Hand Dominance   Dominant Hand: Right    Extremity/Trunk Assessment   Upper Extremity Assessment Upper Extremity Assessment: Overall WFL for tasks assessed (Gross UE MMT 4/5) RUE Sensation: WNL RUE Coordination: decreased fine motor;decreased gross motor LUE Sensation: WNL LUE Coordination: decreased fine motor;decreased gross motor    Lower Extremity Assessment Lower Extremity Assessment: Overall WFL for tasks assessed (Gross LE MMT 4/5)       Communication   Communication: No difficulties  Cognition Arousal/Alertness: Awake/alert Behavior During Therapy: WFL for tasks assessed/performed Overall Cognitive Status: History of cognitive impairments - at baseline Area of Impairment: Orientation                 Orientation Level: Disoriented to, Place, Time                      General Comments General comments (skin integrity, edema, etc.): vss throughout, all lines/leads intact pre/post session    Exercises     Assessment/Plan    PT Assessment Patient needs continued PT services  PT Problem List Decreased strength;Decreased range of motion;Decreased activity tolerance;Decreased balance;Decreased mobility;Decreased safety awareness;Decreased knowledge of use of DME       PT Treatment Interventions DME instruction;Therapeutic exercise;Gait training;Balance training;Stair training;Functional mobility training;Therapeutic activities;Patient/family education    PT Goals (Current goals can be found in the Care Plan section)  Acute Rehab PT Goals Patient Stated Goal: return home PT Goal Formulation: With patient Time For Goal Achievement: 03/28/23 Potential to Achieve Goals: Good    Frequency Min 1X/week     Co-evaluation               AM-PAC PT  "6 Clicks" Mobility  Outcome Measure Help needed turning from your back to your side while in a flat bed without using bedrails?: None Help needed moving from lying on your back to sitting on the side of a flat bed without using bedrails?: None Help needed moving to and from a bed to a chair (including a wheelchair)?: A Little Help needed standing up from a chair using your arms (e.g., wheelchair or bedside chair)?: A Little Help needed to walk in hospital room?: A Little Help needed climbing 3-5 steps with a railing? : A Lot 6 Click Score: 19    End of Session Equipment Utilized During Treatment: Gait belt Activity Tolerance: Patient tolerated treatment well Patient left: in chair;with call bell/phone within reach;with chair alarm set   PT Visit Diagnosis: Other abnormalities of gait and mobility (R26.89);Dizziness and giddiness (R42);Difficulty in walking, not elsewhere classified (R26.2);History of falling (Z91.81)    Time: 4098-1191 PT Time Calculation (min) (ACUTE ONLY): 31 min   Charges:   PT Evaluation $PT Eval Low Complexity: 1 Low PT Treatments $Therapeutic Activity: 23-37 mins PT General Charges $$ ACUTE PT VISIT: 1 Visit         Marv Alfrey, PT, SPT 11:17 AM,03/14/23

## 2023-03-14 NOTE — Progress Notes (Signed)
Eeg done 

## 2023-03-14 NOTE — Progress Notes (Signed)
*  PRELIMINARY RESULTS* Echocardiogram 2D Echocardiogram has been performed.  Cristela Blue 03/14/2023, 8:05 AM

## 2023-03-14 NOTE — TOC Transition Note (Signed)
Transition of Care [facility]) - CM/SW Discharge Note   Patient Details  Name: Carl Figueroa MRN: [id number] Date of Birth: 13-Dec-1945  [facility] CM/SW Contact:  Allena Katz, LCSW Phone Number: 03/14/2023, 3:08 PM   Clinical Narrative:   Pt discharging. CSW spoke with wife regarding home services. Wife reports he is getting aide services through the [place] through [facility]. Wife would also like HH and has used [facility] in the past and would like to use them again. Referral made to jason at [facility]. CSW signing off.           Patient Goals and CMS Choice      Discharge Placement                         Discharge Plan and Services Additional resources added to the After Visit Summary for                                       Social Determinants of Health (SDOH) Interventions SDOH Screenings   Food Insecurity: No Food Insecurity (03/13/2023)  Housing: Low Risk  (03/13/2023)  Transportation Needs: No Transportation Needs (03/13/2023)  Utilities: Not At Risk (03/13/2023)  Tobacco Use: Medium Risk (03/13/2023)     Readmission Risk Interventions     No data to display

## 2023-03-14 NOTE — Procedures (Signed)
Patient Name: Carl Figueroa  MRN: [id number]  Epilepsy Attending: Charlsie Quest  Referring Physician/Provider: Lorretta Harp, MD  Date: 03/14/2023 Duration: 35.24 mins  Patient history: 77yo M with syncope getting eeg to evaluate for seizure  Level of alertness: Awake  AEDs during EEG study: None  Technical aspects: This EEG study was done with scalp electrodes positioned according to the 10-20 International system of electrode placement. Electrical activity was reviewed with band pass filter of 1-70Hz , sensitivity of 7 uV/mm, display speed of 65mm/sec with a 60Hz  notched filter applied as appropriate. EEG data were recorded continuously and digitally stored.  Video monitoring was available and reviewed as appropriate.  Description: The posterior dominant rhythm consists of 7 Hz activity of moderate voltage (25-35 uV) seen predominantly in posterior head regions, symmetric and reactive to eye opening and eye closing. EEG showed continuous generalized predominantly 5-7 Hz theta slowing admixed with intermittent 2-3Hz  delta slowing. Hyperventilation and photic stimulation were not performed.     ABNORMALITY - Continuous slow, generalized - Background slow  IMPRESSION: This study is suggestive of moderate diffuse encephalopathy, nonspecific etiology. No seizures or epileptiform discharges were seen throughout the recording.  Biana Haggar Annabelle Harman

## 2023-03-14 NOTE — Evaluation (Signed)
Occupational Therapy Evaluation Patient Details Name: Carl Figueroa MRN: [id number] DOB: 09-03-1945 Today's Date: 03/14/2023   History of Present Illness Pt is a 77 year old male presenting to the ED after a syncopal episode, etiology unclear; Pmh significant for HTN, dCHF, Lewy body dementia, depression with anxiety, BPH, CKD-3A, traumatic brain injury, abdominal abscess (s/p of colostomy)   Clinical Impression   Chart reviewed to date, pt greeted in chair, agreeable to OT evaluation. Pt is alert and oriented x4, tactile/verbal cues for safety intermittently throughout. PTA pt has a HHA M-F 3-4 hrs per day and wife assists with ADLS as needed. Pt is generally able to feed himself, dress himself, supervision for bathing, wife manages ostomy care. Pt has assist for IADLs. Pt presents with deficits in strength, endurance, activity tolerance, balance, FMC/dexterity all affecting safe and optimal ADL completion. Pt wife reports pt is continuing to rehab from previous hospitalization/illnesses in late 2023 and is close to his current level of functioning. Pt will benefit from acute OT to address functional deficits. Pt is left as received, all needs met, wife in room. OT will follow acutely.      Recommendations for follow up therapy are one component of a multi-disciplinary discharge planning process, led by the attending physician.  Recommendations may be updated based on patient status, additional functional criteria and insurance authorization.   Assistance Recommended at Discharge Frequent or constant Supervision/Assistance  Patient can return home with the following A little help with walking and/or transfers;A little help with bathing/dressing/bathroom    Functional Status Assessment  Patient has had a recent decline in their functional status and demonstrates the ability to make significant improvements in function in a reasonable and predictable amount of time.  Equipment  Recommendations  None recommended by OT    Recommendations for Other Services       Precautions / Restrictions Precautions Precautions: Fall Precaution Comments: watch orthostatics Restrictions Weight Bearing Restrictions: No      Mobility Bed Mobility               General bed mobility comments: NT in recliner pre/post session    Transfers Overall transfer level: Needs assistance Equipment used: Rolling walker (2 wheels) Transfers: Sit to/from Stand Sit to Stand: Min guard           General transfer comment: intermittent vcs for RW use      Balance Overall balance assessment: Needs assistance Sitting-balance support: Feet supported Sitting balance-Leahy Scale: Good     Standing balance support: Bilateral upper extremity supported, During functional activity, Reliant on assistive device for balance Standing balance-Leahy Scale: Fair                             ADL either performed or assessed with clinical judgement   ADL Overall ADL's : Needs assistance/impaired Eating/Feeding: Sitting;Set up Eating/Feeding Details (indicate cue type and reason): to cut food Grooming: Oral care;Wash/dry hands;Standing;Min guard;Cueing for safety Grooming Details (indicate cue type and reason): sink level with RW                 Toilet Transfer: Min guard;Rolling walker (2 wheels);Ambulation;Cueing for safety Toilet Transfer Details (indicate cue type and reason): intermittent vcs for safe RW use         Functional mobility during ADLs: Rolling walker (2 wheels);Cueing for safety;Cueing for sequencing;Min guard (approx 15' in room 2 attempts) General ADL Comments: pt wife reports he appears close  to current level of functioning     Vision Patient Visual Report: No change from baseline              Pertinent Vitals/Pain Pain Assessment Pain Assessment: No/denies pain     Hand Dominance Right   Extremity/Trunk Assessment Upper Extremity  Assessment Upper Extremity Assessment: Overall WFL for tasks assessed (Gross UE MMT 4/5) RUE Sensation: WNL RUE Coordination: decreased fine motor;decreased gross motor LUE Sensation: WNL LUE Coordination: decreased fine motor;decreased gross motor   Lower Extremity Assessment Lower Extremity Assessment: Overall WFL for tasks assessed (Gross LE MMT 4/5)       Communication Communication Communication: No difficulties   Cognition Arousal/Alertness: Awake/alert Behavior During Therapy: WFL for tasks assessed/performed Overall Cognitive Status: Within Functional Limits for tasks assessed Area of Impairment: Problem solving                             Problem Solving: Requires tactile cues General Comments: generally alert and oriented x4, appropriate biographical information provided by patient, verified by chart review and wife     General Comments  vss throughout, all lines/leads intact pre/post session    Exercises Other Exercises Other Exercises: edu and and wife re: role of acute OT, role of rehab, discussion re: discharge recommendations, AE/DME use for safe ADL completion    Home Living Family/patient expects to be discharged to:: Private residence Living Arrangements: Spouse/significant other Available Help at Discharge: Family;Available 24 hours/day;Personal care attendant Type of Home: House Home Access: Stairs to enter [employer] of Steps: 3 Entrance Stairs-Rails: None Home Layout: One level     Bathroom Shower/Tub: Sponge bathes at baseline         Home Equipment: Rollator (4 wheels);Rolling Walker (2 wheels)          Prior Functioning/Environment Prior Level of Function : Needs assist       Physical Assist : ADLs (physical)   ADLs (physical): Dressing;Grooming;Bathing;Toileting Mobility Comments: Ambulates at home with rollator ADLs Comments: Wife and [facility] nurse assist with ADLs        OT Problem List: Decreased  strength;Impaired balance (sitting and/or standing);Decreased activity tolerance;Decreased knowledge of use of DME or AE;Decreased cognition;Impaired UE functional use      OT Treatment/Interventions: Self-care/ADL training;Energy conservation;Cognitive remediation/compensation;Therapeutic exercise;DME and/or AE instruction;Balance training;Patient/family education;Therapeutic activities    OT Goals(Current goals can be found in the care plan section) Acute Rehab OT Goals Patient Stated Goal: go home OT Goal Formulation: With patient Time For Goal Achievement: 03/28/23 Potential to Achieve Goals: Good ADL Goals Pt Will Perform Grooming: with supervision Pt Will Perform Lower Body Dressing: with supervision Pt Will Transfer to Toilet: with supervision Pt Will Perform Toileting - Clothing Manipulation and hygiene: with supervision  OT Frequency: Min 1X/week    Co-evaluation              AM-PAC OT "6 Clicks" Daily Activity     Outcome Measure Help from another person eating meals?: A Little Help from another person taking care of personal grooming?: A Little Help from another person toileting, which includes using toliet, bedpan, or urinal?: A Lot Help from another person bathing (including washing, rinsing, drying)?: A Little Help from another person to put on and taking off regular upper body clothing?: A Little Help from another person to put on and taking off regular lower body clothing?: A Little 6 Click Score: 17   End of Session Equipment Utilized During  Treatment: Rolling walker (2 wheels) Nurse Communication: Mobility status  Activity Tolerance: Patient tolerated treatment well Patient left: in chair;with call bell/phone within reach;with chair alarm set;with family/visitor present  OT Visit Diagnosis: Other abnormalities of gait and mobility (R26.89)                Time: 1610-9604 OT Time Calculation (min): 27 min Charges:  OT General Charges $OT Visit: 1 Visit OT  Evaluation $OT Eval Moderate Complexity: 1 Mod  Oleta Mouse, OTD OTR/L  03/14/23, 10:55 AM

## 2023-03-25 ENCOUNTER — Encounter: Payer: Self-pay | Admitting: Oncology

## 2023-03-25 ENCOUNTER — Inpatient Hospital Stay: Payer: Medicare Other | Admitting: Oncology

## 2023-03-25 ENCOUNTER — Inpatient Hospital Stay: Payer: Medicare Other

## 2023-03-25 ENCOUNTER — Inpatient Hospital Stay: Payer: Medicare Other | Attending: Oncology

## 2023-03-25 VITALS — BP 108/59 | HR 64 | Temp 96.6°F | Resp 16 | Ht 67.0 in | Wt 160.3 lb

## 2023-03-25 DIAGNOSIS — D638 Anemia in other chronic diseases classified elsewhere: Secondary | ICD-10-CM

## 2023-03-25 DIAGNOSIS — D509 Iron deficiency anemia, unspecified: Secondary | ICD-10-CM

## 2023-03-25 DIAGNOSIS — F0284 Dementia in other diseases classified elsewhere, unspecified severity, with anxiety: Secondary | ICD-10-CM | POA: Diagnosis not present

## 2023-03-25 LAB — CBC WITH DIFFERENTIAL/PLATELET
Abs Immature Granulocytes: 0.01 10*3/uL (ref 0.00–0.07)
Basophils Absolute: 0 10*3/uL (ref 0.0–0.1)
Basophils Relative: 0 %
Eosinophils Absolute: 0.2 10*3/uL (ref 0.0–0.5)
Eosinophils Relative: 4 %
HCT: 36.2 % — ABNORMAL LOW (ref 39.0–52.0)
Hemoglobin: 11.7 g/dL — ABNORMAL LOW (ref 13.0–17.0)
Immature Granulocytes: 0 %
Lymphocytes Relative: 25 %
Lymphs Abs: 1.4 10*3/uL (ref 0.7–4.0)
MCH: 27.8 pg (ref 26.0–34.0)
MCHC: 32.3 g/dL (ref 30.0–36.0)
MCV: 86 fL (ref 80.0–100.0)
Monocytes Absolute: 0.6 10*3/uL (ref 0.1–1.0)
Monocytes Relative: 10 %
Neutro Abs: 3.3 10*3/uL (ref 1.7–7.7)
Neutrophils Relative %: 61 %
Platelets: 167 10*3/uL (ref 150–400)
RBC: 4.21 MIL/uL — ABNORMAL LOW (ref 4.22–5.81)
RDW: 13.4 % (ref 11.5–15.5)
WBC: 5.5 10*3/uL (ref 4.0–10.5)
nRBC: 0 % (ref 0.0–0.2)

## 2023-03-25 LAB — COMPREHENSIVE METABOLIC PANEL
ALT: 13 U/L (ref 0–44)
AST: 18 U/L (ref 15–41)
Albumin: 3.5 g/dL (ref 3.5–5.0)
Alkaline Phosphatase: 73 U/L (ref 38–126)
Anion gap: 5 (ref 5–15)
BUN: 15 mg/dL (ref 8–23)
CO2: 26 mmol/L (ref 22–32)
Calcium: 9.5 mg/dL (ref 8.9–10.3)
Chloride: 109 mmol/L (ref 98–111)
Creatinine, Ser: 1.4 mg/dL — ABNORMAL HIGH (ref 0.61–1.24)
GFR, Estimated: 52 mL/min — ABNORMAL LOW (ref >60)
Glucose, Bld: 73 mg/dL (ref 70–99)
Potassium: 4.1 mmol/L (ref 3.5–5.1)
Sodium: 140 mmol/L (ref 135–145)
Total Bilirubin: 0.5 mg/dL (ref 0.3–1.2)
Total Protein: 6.7 g/dL (ref 6.5–8.1)

## 2023-03-25 LAB — IRON AND TIBC
Iron: 83 ug/dL (ref 45–182)
Saturation Ratios: 25 % (ref 17.9–39.5)
TIBC: 326 ug/dL (ref 250–450)
UIBC: 243 ug/dL

## 2023-03-25 LAB — FERRITIN: Ferritin: 36 ng/mL (ref 24–336)

## 2023-03-25 NOTE — Addendum Note (Signed)
Addended by: Corene Cornea on: 03/25/2023 01:51 PM   Modules accepted: Orders

## 2023-03-25 NOTE — Progress Notes (Signed)
Hematology/Oncology Consult note [facility]  Telephone:([phone] [phone]  Patient Care Team: Mick Sell, MD as PCP - General (Infectious Diseases) End, Cristal Deer, MD as PCP - Cardiology (Cardiology) Creig Hines, MD as Consulting Physician (Oncology)   Name of the patient: Carl Figueroa  [id number]  1945/12/24   Date of visit: 03/25/23  Diagnosis-anemia of chronic disease with a component of iron deficiency  Chief complaint/ Reason for visit-routine follow-up of anemia  Heme/Onc history: patient is a 77 year old maleWho underwent total colectomy end ileostomy at the [place] in October 2023.  He has a history of Lewy body dementia.  He has been having back-to-back hospitalizations since his colectomy for altered mental status as well as intra-abdominal abscesses and UTIs.  He was discharged from the hospital 3 weeks ago.  During his hospitalization his hemoglobin had dropped down to 7.6 needing blood transfusion.  At baseline his hemoglobin runs around 11-12.  Ferritin and iron studies B12 and folate were checked during hospitalization and were normal and consistent with anemia of chronic disease.  Workup for multiple myeloma negative.  No evidence of hemolysis.    Interval history-patient is doing well overall.He was hospitalized in July 2024 for an episode of syncope.  Presently reports that his appetite and weight have been stable.  He is eating better.  ECOG PS- 1 Pain scale- 0   Review of systems- Review of Systems  Constitutional:  Negative for chills, fever, malaise/fatigue and weight loss.  HENT:  Negative for congestion, ear discharge and nosebleeds.   Eyes:  Negative for blurred vision.  Respiratory:  Negative for cough, hemoptysis, sputum production, shortness of breath and wheezing.   Cardiovascular:  Negative for chest pain, palpitations, orthopnea and claudication.  Gastrointestinal:  Negative for abdominal pain, blood  in stool, constipation, diarrhea, heartburn, melena, nausea and vomiting.  Genitourinary:  Negative for dysuria, flank pain, frequency, hematuria and urgency.  Musculoskeletal:  Negative for back pain, joint pain and myalgias.  Skin:  Negative for rash.  Neurological:  Negative for dizziness, tingling, focal weakness, seizures, weakness and headaches.  Endo/Heme/Allergies:  Does not bruise/bleed easily.  Psychiatric/Behavioral:  Negative for depression and suicidal ideas. The patient does not have insomnia.       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   Procedure: TOTAL HIP ARTHROPLASTY ANTERIOR APPROACH;  Surgeon: Kennedy Bucker, MD;  Location: [facility] ORS;  Service: Orthopedics;  Laterality: Left;    Social History   Socioeconomic History   Marital status: Married    Spouse name: Not on file   Number of children: Not on file   Years of education: Not on file   Highest education level: Not on file  Occupational History   Not on file  Tobacco Use   Smoking status: Former    Current packs/day: 0.00    Average packs/day:  2.0 packs/day for 20.0 years (40.0 ttl pk-yrs)    Types: Cigarettes    Start date: 10/22/1959    Quit date: 10/22/1979    Years since quitting: 43.4   Smokeless tobacco: Never  Vaping Use   Vaping status: Never Used  Substance and Sexual Activity   Alcohol use: No   Drug use: No   Sexual activity: Not on file  Other Topics Concern   Not on file  Social History Narrative   Not on file   Social Determinants of  Health   Financial Resource Strain: Not on file  Food Insecurity: No Food Insecurity (03/13/2023)   Hunger Vital Sign    Worried About Running Out of Food in the Last Year: Never true    Ran Out of Food in the Last Year: Never true  Transportation Needs: No Transportation Needs (03/13/2023)   PRAPARE - Administrator, Civil Service (Medical): No    Lack of Transportation (Non-Medical): No  Physical Activity: Not on file  Stress: Not on file  Social Connections: Not on file  Intimate Partner Violence: Not At Risk (03/13/2023)   Humiliation, Afraid, Rape, and Kick questionnaire    Fear of Current or Ex-Partner: No    Emotionally Abused: No    Physically Abused: No    Sexually Abused: No    Family History  Problem Relation Age of Onset   Cancer Mother    Diabetes Mellitus II Neg Hx      Current Outpatient Medications:    escitalopram (LEXAPRO) 5 MG tablet, Take 5 mg by mouth daily., Disp: , Rfl:    finasteride (PROSCAR) 5 MG tablet, Take 1 tablet (5 mg total) by mouth daily., Disp: 30 tablet, Rfl: 0   loratadine (CLARITIN) 10 MG tablet, Take 10 mg by mouth daily., Disp: , Rfl:    Melatonin 5 MG CAPS, Take 1 capsule by mouth at bedtime., Disp: , Rfl:    Multiple Vitamin (MULTIVITAMIN WITH MINERALS) TABS tablet, Take 1 tablet by mouth daily., Disp: , Rfl:    rivastigmine (EXELON) 4.6 mg/24hr, Place 4.6 mg onto the skin daily., Disp: , Rfl:    tamsulosin (FLOMAX) 0.4 MG CAPS capsule, Take 1 capsule (0.4 mg total) by mouth daily., Disp: 30 capsule, Rfl: 0   zinc sulfate 220 (50 Zn) MG capsule, Take 1 capsule (220 mg total) by mouth daily., Disp: 30 capsule, Rfl: 0   feeding supplement (ENSURE ENLIVE / ENSURE PLUS) LIQD, Take 237 mLs by mouth 3 (three) times daily between meals. (Patient not taking: Reported on 03/25/2023), Disp: 21330 mL, Rfl: 0  Physical exam:  Vitals:   03/25/23 1110  BP: (!) 108/59  Pulse: 64  Resp: 16  Temp: (!) 96.6 F (35.9 C)  TempSrc: Tympanic   SpO2: 100%  Weight: 160 lb 4.8 oz (72.7 kg)  Height: 5\' 7"  (1.702 m)   Physical Exam Cardiovascular:     Rate and Rhythm: Normal rate and regular rhythm.     Heart sounds: Normal heart sounds.  Pulmonary:     Effort: Pulmonary effort is normal.     Breath sounds: Normal breath sounds.  Abdominal:     General: Bowel sounds are normal.     Palpations: Abdomen is soft.  Skin:    General: Skin is warm and dry.  Neurological:     Mental Status: He is alert and oriented to person, place, and time.         Latest Ref Rng & Units  03/25/2023   10:38 AM  CMP  Glucose 70 - 99 mg/dL 73   BUN 8 - 23 mg/dL 15   Creatinine 4.78 - 1.24 mg/dL 2.95   Sodium 621 - 308 mmol/L 140   Potassium 3.5 - 5.1 mmol/L 4.1   Chloride 98 - 111 mmol/L 109   CO2 22 - 32 mmol/L 26   Calcium 8.9 - 10.3 mg/dL 9.5   Total Protein 6.5 - 8.1 g/dL 6.7   Total Bilirubin 0.3 - 1.2 mg/dL 0.5   Alkaline Phos 38 - 126 U/L 73   AST 15 - 41 U/L 18   ALT 0 - 44 U/L 13       Latest Ref Rng & Units 03/25/2023   10:38 AM  CBC  WBC 4.0 - 10.5 K/uL 5.5   Hemoglobin 13.0 - 17.0 g/dL 65.7   Hematocrit 84.6 - 52.0 % 36.2   Platelets 150 - 400 K/uL 167     No images are attached to the encounter.  EEG adult  Result Date: 03/14/2023 Charlsie Quest, MD     03/14/2023  4:32 PM Patient Name: Crishawn Segel MRN: [id number] Epilepsy Attending: Charlsie Quest Referring Physician/Provider: Lorretta Harp, MD Date: 03/14/2023 Duration: 35.24 mins Patient history: 77yo M with syncope getting eeg to evaluate for seizure Level of alertness: Awake AEDs during EEG study: None Technical aspects: This EEG study was done with scalp electrodes positioned according to the 10-20 International system of electrode placement. Electrical activity was reviewed with band pass filter of 1-70Hz , sensitivity of 7 uV/mm, display speed of 40mm/sec with a 60Hz  notched filter applied as appropriate. EEG data were recorded continuously and digitally  stored.  Video monitoring was available and reviewed as appropriate. Description: The posterior dominant rhythm consists of 7 Hz activity of moderate voltage (25-35 uV) seen predominantly in posterior head regions, symmetric and reactive to eye opening and eye closing. EEG showed continuous generalized predominantly 5-7 Hz theta slowing admixed with intermittent 2-3Hz  delta slowing. Hyperventilation and photic stimulation were not performed.   ABNORMALITY - Continuous slow, generalized - Background slow IMPRESSION: This study is suggestive of moderate diffuse encephalopathy, nonspecific etiology. No seizures or epileptiform discharges were seen throughout the recording. Charlsie Quest   ECHOCARDIOGRAM COMPLETE  Result Date: 03/14/2023    ECHOCARDIOGRAM REPORT   Patient Name:   JAHMEEK PECHACEK Date of Exam: 03/14/2023 Medical Rec #:  [id number]            Height:       67.0 in Accession #:    [id number]           Weight:       148.6 lb Date of Birth:  November 04, 1945            BSA:          1.782 m Patient Age:    77 years             BP:           124/65 mmHg Patient Gender: M                    HR:           59 bpm. Exam Location:  [facility] Procedure: 2D Echo, Cardiac Doppler and Color Doppler Indications:     Syncope R55  History:         Patient has prior history of Echocardiogram examinations, most  recent 07/08/2019. Risk Factors:Hypertension.  Sonographer:     Cristela Blue Referring Phys:  [id number] Lorretta Harp Diagnosing Phys: Julien Nordmann MD  Sonographer Comments: Image acquisition challenging due to patient body habitus. IMPRESSIONS  1. Left ventricular ejection fraction, by estimation, is 60 to 65%. The left ventricle has normal function. The left ventricle has no regional wall motion abnormalities. There is moderate left ventricular hypertrophy. Left ventricular diastolic parameters are indeterminate.  2. Right ventricular systolic function is normal. The right ventricular size is normal.  3.  The mitral valve is normal in structure. No evidence of mitral valve regurgitation. No evidence of mitral stenosis.  4. The aortic valve is normal in structure. There is mild calcification of the aortic valve. Aortic valve regurgitation is not visualized. Aortic valve sclerosis/calcification is present, without any evidence of aortic stenosis. Aortic valve mean gradient measures 2.5 mmHg.  5. The inferior vena cava is normal in size with greater than 50% respiratory variability, suggesting right atrial pressure of 3 mmHg. FINDINGS  Left Ventricle: Left ventricular ejection fraction, by estimation, is 60 to 65%. The left ventricle has normal function. The left ventricle has no regional wall motion abnormalities. The left ventricular internal cavity size was normal in size. There is  moderate left ventricular hypertrophy. Left ventricular diastolic parameters are indeterminate. Right Ventricle: The right ventricular size is normal. No increase in right ventricular wall thickness. Right ventricular systolic function is normal. Left Atrium: Left atrial size was normal in size. Right Atrium: Right atrial size was normal in size. Pericardium: There is no evidence of pericardial effusion. Mitral Valve: The mitral valve is normal in structure. Mild mitral annular calcification. No evidence of mitral valve regurgitation. No evidence of mitral valve stenosis. MV peak gradient, 7.2 mmHg. The mean mitral valve gradient is 2.0 mmHg. Tricuspid Valve: The tricuspid valve is normal in structure. Tricuspid valve regurgitation is not demonstrated. No evidence of tricuspid stenosis. Aortic Valve: The aortic valve is normal in structure. There is mild calcification of the aortic valve. Aortic valve regurgitation is not visualized. Aortic valve sclerosis/calcification is present, without any evidence of aortic stenosis. Aortic valve mean gradient measures 2.5 mmHg. Aortic valve peak gradient measures 5.0 mmHg. Aortic valve area, by VTI  measures 3.42 cm. Pulmonic Valve: The pulmonic valve was normal in structure. Pulmonic valve regurgitation is not visualized. No evidence of pulmonic stenosis. Aorta: The aortic root is normal in size and structure. Venous: The inferior vena cava is normal in size with greater than 50% respiratory variability, suggesting right atrial pressure of 3 mmHg. IAS/Shunts: No atrial level shunt detected by color flow Doppler.  LEFT VENTRICLE PLAX 2D LVIDd:         3.80 cm   Diastology LVIDs:         2.40 cm   LV e' medial:    6.42 cm/s LV PW:         1.10 cm   LV E/e' medial:  15.3 LV IVS:        1.40 cm   LV e' lateral:   9.68 cm/s LVOT diam:     2.00 cm   LV E/e' lateral: 10.1 LV SV:         76 LV SV Index:   43 LVOT Area:     3.14 cm  RIGHT VENTRICLE RV Basal diam:  4.30 cm RV Mid diam:    3.10 cm LEFT ATRIUM             Index  RIGHT ATRIUM           Index LA diam:        1.90 cm 1.07 cm/m   RA Area:     15.20 cm LA Vol (A2C):   19.5 ml 10.94 ml/m  RA Volume:   40.20 ml  22.55 ml/m LA Vol (A4C):   32.3 ml 18.12 ml/m LA Biplane Vol: 26.0 ml 14.59 ml/m  AORTIC VALVE AV Area (Vmax):    3.03 cm AV Area (Vmean):   3.32 cm AV Area (VTI):     3.42 cm AV Vmax:           112.00 cm/s AV Vmean:          77.950 cm/s AV VTI:            0.224 m AV Peak Grad:      5.0 mmHg AV Mean Grad:      2.5 mmHg LVOT Vmax:         108.00 cm/s LVOT Vmean:        82.300 cm/s LVOT VTI:          0.243 m LVOT/AV VTI ratio: 1.09  AORTA Ao Root diam: 2.70 cm MITRAL VALVE               TRICUSPID VALVE MV Area (PHT): 3.40 cm    TR Peak grad:   15.5 mmHg MV Area VTI:   2.01 cm    TR Vmax:        197.00 cm/s MV Peak grad:  7.2 mmHg MV Mean grad:  2.0 mmHg    SHUNTS MV Vmax:       1.34 m/s    Systemic VTI:  0.24 m MV Vmean:      59.4 cm/s   Systemic Diam: 2.00 cm MV Decel Time: 223 msec MV E velocity: 98.00 cm/s MV A velocity: 78.10 cm/s MV E/A ratio:  1.25 Julien Nordmann MD Electronically signed by Julien Nordmann MD Signature Date/Time:  03/14/2023/1:35:17 PM    Final    MR BRAIN WO CONTRAST  Result Date: 03/13/2023 CLINICAL DATA:  Initial evaluation for syncope/presyncope. EXAM: MRI HEAD WITHOUT CONTRAST TECHNIQUE: Multiplanar, multiecho pulse sequences of the brain and surrounding structures were obtained without intravenous contrast. COMPARISON:  CT from 08/06/2022. FINDINGS: Brain: Diffuse prominence of the CSF containing spaces compatible generalized cerebral atrophy. Few scattered subcentimeter foci of T2/FLAIR hyperintensity noted involving the supratentorial cerebral white matter and pons, most likely related to chronic microvascular ischemic disease, mild for age. No evidence for acute or subacute ischemia. Gray-white matter differentiation maintained. No areas of chronic cortical infarction. No acute or chronic intracranial blood products. No mass lesion, midline shift or mass effect. No hydrocephalus or extra-axial fluid collection. Pituitary gland suprasellar region within normal limits. No evidence Vascular: Major intracranial vascular flow voids are maintained. Skull and upper cervical spine: Cranial junction within normal limits. Bone marrow signal intensity normal. No scalp soft tissue abnormality. Sinuses/Orbits: Prior ocular lens replacement on the right. Paranasal sinuses are largely clear. No significant mastoid effusion. Other: None. IMPRESSION: 1. No acute intracranial abnormality. 2. Generalized age-related cerebral atrophy with mild chronic small vessel ischemic disease. Electronically Signed   By: Rise Mu M.D.   On: 03/13/2023 21:56     Assessment and plan- Patient is a 77 y.o. male here for routine follow-up of anemia of chronicDisease   Patient's hemoglobin has gradually improved over the last 6 months from 7.5 presently to 11.7.  Ferritin levels are  mildly low at 36 although iron studies are presently within normal limits.  I will have him take oral iron once a day or every other day.  He will need to  speak to Dr. Sampson Goon about potential GI workup given his mild iron deficiency  Given the overall improvement in his counts I will repeat his labs in 6 months and see him back in 1 year.  Patient was previously noted to have mildly elevated kappa light chain with a ratio of 3.  I am continuing to monitor it every 6 months as well   Visit Diagnosis 1. Anemia of chronic disease   2. Iron deficiency anemia, unspecified iron deficiency anemia type      Dr. Owens Shark, MD, MPH [facility] [phone] 03/25/2023 12:31 PM

## 2023-03-26 ENCOUNTER — Telehealth: Payer: Self-pay | Admitting: *Deleted

## 2023-03-26 NOTE — Telephone Encounter (Signed)
I called the pt, and let him know that his iron was good but a little low and she would like him to take over-the-counter iron pill called ferrous sulfate look for 325 mg and take 1 every other day on a full stomach so that hopefully does not irritate with your stomach.  I also asked the patient to call me back and see if he has gotten that message and if he has any questions about it

## 2023-04-03 ENCOUNTER — Telehealth: Payer: Self-pay | Admitting: *Deleted

## 2023-04-03 NOTE — Telephone Encounter (Signed)
Called today just to see if the patient had started his oral iron over-the-counter.  The wife got on the phone and said that he can get it from the [place] and they are waiting for it to come in the next few days to start the iron pills

## 2023-09-25 ENCOUNTER — Inpatient Hospital Stay: Payer: Medicare Other | Attending: Oncology

## 2023-09-25 DIAGNOSIS — D509 Iron deficiency anemia, unspecified: Secondary | ICD-10-CM | POA: Insufficient documentation

## 2023-09-25 DIAGNOSIS — D638 Anemia in other chronic diseases classified elsewhere: Secondary | ICD-10-CM

## 2023-09-25 LAB — COMPREHENSIVE METABOLIC PANEL
ALT: 11 U/L (ref 0–44)
AST: 15 U/L (ref 15–41)
Albumin: 3.5 g/dL (ref 3.5–5.0)
Alkaline Phosphatase: 54 U/L (ref 38–126)
Anion gap: 9 (ref 5–15)
BUN: 14 mg/dL (ref 8–23)
CO2: 24 mmol/L (ref 22–32)
Calcium: 9.4 mg/dL (ref 8.9–10.3)
Chloride: 110 mmol/L (ref 98–111)
Creatinine, Ser: 1.51 mg/dL — ABNORMAL HIGH (ref 0.61–1.24)
GFR, Estimated: 47 mL/min — ABNORMAL LOW (ref >60)
Glucose, Bld: 68 mg/dL — ABNORMAL LOW (ref 70–99)
Potassium: 3.7 mmol/L (ref 3.5–5.1)
Sodium: 143 mmol/L (ref 135–145)
Total Bilirubin: 0.7 mg/dL (ref 0.0–1.2)
Total Protein: 6.1 g/dL — ABNORMAL LOW (ref 6.5–8.1)

## 2023-09-25 LAB — CBC WITH DIFFERENTIAL/PLATELET
Abs Immature Granulocytes: 0.01 10*3/uL (ref 0.00–0.07)
Basophils Absolute: 0 10*3/uL (ref 0.0–0.1)
Basophils Relative: 0 %
Eosinophils Absolute: 0.1 10*3/uL (ref 0.0–0.5)
Eosinophils Relative: 3 %
HCT: 35.8 % — ABNORMAL LOW (ref 39.0–52.0)
Hemoglobin: 11.8 g/dL — ABNORMAL LOW (ref 13.0–17.0)
Immature Granulocytes: 0 %
Lymphocytes Relative: 28 %
Lymphs Abs: 1.2 10*3/uL (ref 0.7–4.0)
MCH: 28.4 pg (ref 26.0–34.0)
MCHC: 33 g/dL (ref 30.0–36.0)
MCV: 86.3 fL (ref 80.0–100.0)
Monocytes Absolute: 0.5 10*3/uL (ref 0.1–1.0)
Monocytes Relative: 10 %
Neutro Abs: 2.6 10*3/uL (ref 1.7–7.7)
Neutrophils Relative %: 59 %
Platelets: 157 10*3/uL (ref 150–400)
RBC: 4.15 MIL/uL — ABNORMAL LOW (ref 4.22–5.81)
RDW: 12.8 % (ref 11.5–15.5)
WBC: 4.5 10*3/uL (ref 4.0–10.5)
nRBC: 0 % (ref 0.0–0.2)

## 2023-09-25 LAB — IRON AND TIBC
Iron: 80 ug/dL (ref 45–182)
Saturation Ratios: 28 % (ref 17.9–39.5)
TIBC: 288 ug/dL (ref 250–450)
UIBC: 208 ug/dL

## 2023-09-25 LAB — FERRITIN: Ferritin: 58 ng/mL (ref 24–336)

## 2023-09-26 LAB — KAPPA/LAMBDA LIGHT CHAINS
Kappa free light chain: 26.7 mg/L — ABNORMAL HIGH (ref 3.3–19.4)
Kappa, lambda light chain ratio: 2.1 — ABNORMAL HIGH (ref 0.26–1.65)
Lambda free light chains: 12.7 mg/L (ref 5.7–26.3)

## 2023-09-30 LAB — MULTIPLE MYELOMA PANEL, SERUM
Albumin SerPl Elph-Mcnc: 3.5 g/dL (ref 2.9–4.4)
Albumin/Glob SerPl: 1.6 (ref 0.7–1.7)
Alpha 1: 0.2 g/dL (ref 0.0–0.4)
Alpha2 Glob SerPl Elph-Mcnc: 0.5 g/dL (ref 0.4–1.0)
B-Globulin SerPl Elph-Mcnc: 0.8 g/dL (ref 0.7–1.3)
Gamma Glob SerPl Elph-Mcnc: 0.9 g/dL (ref 0.4–1.8)
Globulin, Total: 2.3 g/dL (ref 2.2–3.9)
IgA: 181 mg/dL (ref 61–437)
IgG (Immunoglobin G), Serum: 995 mg/dL (ref 603–1613)
IgM (Immunoglobulin M), Srm: 54 mg/dL (ref 15–143)
Total Protein ELP: 5.8 g/dL — ABNORMAL LOW (ref 6.0–8.5)

## 2024-01-21 ENCOUNTER — Encounter (HOSPITAL_COMMUNITY): Payer: Self-pay

## 2024-01-21 ENCOUNTER — Other Ambulatory Visit: Payer: Self-pay

## 2024-01-21 ENCOUNTER — Emergency Department (HOSPITAL_COMMUNITY)

## 2024-01-21 ENCOUNTER — Inpatient Hospital Stay (HOSPITAL_COMMUNITY)
Admission: EM | Admit: 2024-01-21 | Discharge: 2024-01-23 | DRG: 281 | Disposition: A | Discharge status: Home Health | Attending: Internal Medicine | Admitting: Internal Medicine

## 2024-01-21 DIAGNOSIS — Z96642 Presence of left artificial hip joint: Secondary | ICD-10-CM | POA: Diagnosis present

## 2024-01-21 DIAGNOSIS — F0280 Dementia in other diseases classified elsewhere without behavioral disturbance: Secondary | ICD-10-CM | POA: Diagnosis present

## 2024-01-21 DIAGNOSIS — Z87891 Personal history of nicotine dependence: Secondary | ICD-10-CM | POA: Diagnosis not present

## 2024-01-21 DIAGNOSIS — I214 Non-ST elevation (NSTEMI) myocardial infarction: Secondary | ICD-10-CM | POA: Diagnosis present

## 2024-01-21 DIAGNOSIS — G20A1 Parkinson's disease without dyskinesia, without mention of fluctuations: Secondary | ICD-10-CM | POA: Diagnosis present

## 2024-01-21 DIAGNOSIS — M791 Myalgia, unspecified site: Secondary | ICD-10-CM | POA: Diagnosis present

## 2024-01-21 DIAGNOSIS — N400 Enlarged prostate without lower urinary tract symptoms: Secondary | ICD-10-CM | POA: Diagnosis present

## 2024-01-21 DIAGNOSIS — I472 Ventricular tachycardia, unspecified: Secondary | ICD-10-CM | POA: Diagnosis present

## 2024-01-21 DIAGNOSIS — Z933 Colostomy status: Secondary | ICD-10-CM

## 2024-01-21 DIAGNOSIS — F32A Depression, unspecified: Secondary | ICD-10-CM | POA: Diagnosis present

## 2024-01-21 DIAGNOSIS — Z87820 Personal history of traumatic brain injury: Secondary | ICD-10-CM | POA: Diagnosis not present

## 2024-01-21 DIAGNOSIS — I4729 Other ventricular tachycardia: Secondary | ICD-10-CM

## 2024-01-21 DIAGNOSIS — F4310 Post-traumatic stress disorder, unspecified: Secondary | ICD-10-CM | POA: Diagnosis present

## 2024-01-21 DIAGNOSIS — F4000 Agoraphobia, unspecified: Secondary | ICD-10-CM | POA: Diagnosis present

## 2024-01-21 DIAGNOSIS — N1832 Chronic kidney disease, stage 3b: Secondary | ICD-10-CM | POA: Diagnosis present

## 2024-01-21 DIAGNOSIS — Z885 Allergy status to narcotic agent status: Secondary | ICD-10-CM

## 2024-01-21 DIAGNOSIS — R7989 Other specified abnormal findings of blood chemistry: Secondary | ICD-10-CM | POA: Diagnosis not present

## 2024-01-21 DIAGNOSIS — Z79899 Other long term (current) drug therapy: Secondary | ICD-10-CM

## 2024-01-21 DIAGNOSIS — N1831 Chronic kidney disease, stage 3a: Secondary | ICD-10-CM | POA: Diagnosis present

## 2024-01-21 DIAGNOSIS — I129 Hypertensive chronic kidney disease with stage 1 through stage 4 chronic kidney disease, or unspecified chronic kidney disease: Secondary | ICD-10-CM | POA: Diagnosis present

## 2024-01-21 DIAGNOSIS — D649 Anemia, unspecified: Secondary | ICD-10-CM | POA: Diagnosis present

## 2024-01-21 DIAGNOSIS — R296 Repeated falls: Secondary | ICD-10-CM | POA: Insufficient documentation

## 2024-01-21 DIAGNOSIS — Z8674 Personal history of sudden cardiac arrest: Secondary | ICD-10-CM

## 2024-01-21 DIAGNOSIS — E785 Hyperlipidemia, unspecified: Secondary | ICD-10-CM | POA: Diagnosis present

## 2024-01-21 DIAGNOSIS — F0283 Dementia in other diseases classified elsewhere, unspecified severity, with mood disturbance: Secondary | ICD-10-CM | POA: Diagnosis present

## 2024-01-21 DIAGNOSIS — G3183 Dementia with Lewy bodies: Secondary | ICD-10-CM | POA: Diagnosis present

## 2024-01-21 DIAGNOSIS — R2681 Unsteadiness on feet: Secondary | ICD-10-CM

## 2024-01-21 DIAGNOSIS — N183 Chronic kidney disease, stage 3 unspecified: Secondary | ICD-10-CM | POA: Diagnosis present

## 2024-01-21 HISTORY — DX: Other ventricular tachycardia: I47.29

## 2024-01-21 LAB — CBC
HCT: 37.4 % — ABNORMAL LOW (ref 39.0–52.0)
Hemoglobin: 12 g/dL — ABNORMAL LOW (ref 13.0–17.0)
MCH: 28.2 pg (ref 26.0–34.0)
MCHC: 32.1 g/dL (ref 30.0–36.0)
MCV: 87.8 fL (ref 80.0–100.0)
Platelets: 169 10*3/uL (ref 150–400)
RBC: 4.26 MIL/uL (ref 4.22–5.81)
RDW: 13.1 % (ref 11.5–15.5)
WBC: 6.3 10*3/uL (ref 4.0–10.5)
nRBC: 0 % (ref 0.0–0.2)

## 2024-01-21 LAB — BASIC METABOLIC PANEL WITH GFR
Anion gap: 5 (ref 5–15)
BUN: 18 mg/dL (ref 8–23)
CO2: 26 mmol/L (ref 22–32)
Calcium: 9.6 mg/dL (ref 8.9–10.3)
Chloride: 113 mmol/L — ABNORMAL HIGH (ref 98–111)
Creatinine, Ser: 1.7 mg/dL — ABNORMAL HIGH (ref 0.61–1.24)
GFR, Estimated: 41 mL/min — ABNORMAL LOW (ref >60)
Glucose, Bld: 92 mg/dL (ref 70–99)
Potassium: 4.6 mmol/L (ref 3.5–5.1)
Sodium: 144 mmol/L (ref 135–145)

## 2024-01-21 LAB — TROPONIN I (HIGH SENSITIVITY)
Troponin I (High Sensitivity): 29 ng/L — ABNORMAL HIGH (ref <18)
Troponin I (High Sensitivity): 102 ng/L (ref <18)
Troponin I (High Sensitivity): 37 ng/L — ABNORMAL HIGH (ref <18)

## 2024-01-21 MED ORDER — MELATONIN 5 MG PO TABS
10.0000 mg | ORAL_TABLET | Freq: Every day | ORAL | Status: DC
  Administered 2024-01-22: 10 mg via ORAL
  Filled 2024-01-21 (×2): qty 2

## 2024-01-21 MED ORDER — HEPARIN (PORCINE) 25000 UT/250ML-% IV SOLN
850.0000 [IU]/h | INTRAVENOUS | Status: DC
  Administered 2024-01-21: 900 [IU]/h via INTRAVENOUS
  Filled 2024-01-21: qty 250

## 2024-01-21 MED ORDER — HEPARIN BOLUS VIA INFUSION
4000.0000 [IU] | Freq: Once | INTRAVENOUS | Status: AC
  Administered 2024-01-21: 4000 [IU] via INTRAVENOUS
  Filled 2024-01-21: qty 4000

## 2024-01-21 MED ORDER — RIVASTIGMINE 4.6 MG/24HR TD PT24
4.6000 mg | MEDICATED_PATCH | Freq: Every day | TRANSDERMAL | Status: DC
  Administered 2024-01-22 – 2024-01-23 (×2): 4.6 mg via TRANSDERMAL
  Filled 2024-01-21 (×2): qty 1

## 2024-01-21 NOTE — ED Notes (Signed)
 Critical troponin of 102-MD notified

## 2024-01-21 NOTE — ED Notes (Signed)
 Got patient into a gown on the monitor patient is resting with call bell in reach

## 2024-01-21 NOTE — ED Provider Notes (Signed)
 [place] EMERGENCY DEPARTMENT AT [facility] Provider Note   CSN: [id number] Arrival date & time: 01/21/24  1407     History  Chief Complaint  Patient presents with   Chest Pain    Rayyan Burley is a 78 y.o. male.  Patient is a 78 year old male with a history of hypertension, dementia, traumatic brain injury, prior sepsis due to perforated bowel who presents with chest pain.  History is obtained from the patient and the patient's wife.  Patient was complaining of some pain in both of his arms earlier today.  He did not have any pain across his chest.  No shortness of breath.  He says it is worse when he moves his arms.  He does say that he had physical therapy and was using his arms this morning.  No associated back pain.  No cough or cold symptoms.  No fevers.       Home Medications Prior to Admission medications   Medication Sig Start Date End Date Taking? Authorizing Provider  escitalopram  (LEXAPRO ) 5 MG tablet Take 5 mg by mouth daily. 01/23/23   [provider]  feeding supplement (ENSURE ENLIVE / ENSURE PLUS) LIQD Take 237 mLs by mouth 3 (three) times daily between meals. Patient not taking: Reported on 03/25/2023 07/26/22   Verla Glaze, MD  finasteride  (PROSCAR ) 5 MG tablet Take 1 tablet (5 mg total) by mouth daily. 08/09/22   Verla Glaze, MD  loratadine  (CLARITIN ) 10 MG tablet Take 10 mg by mouth daily. 04/20/22 04/21/23  [provider]  Melatonin 5 MG CAPS Take 1 capsule by mouth at bedtime.    [provider]  Multiple Vitamin (MULTIVITAMIN WITH MINERALS) TABS tablet Take 1 tablet by mouth daily. 07/27/22   Verla Glaze, MD  rivastigmine  (EXELON ) 4.6 mg/24hr Place 4.6 mg onto the skin daily. 12/06/22 01/24/24  [provider]  tamsulosin  (FLOMAX ) 0.4 MG CAPS capsule Take 1 capsule (0.4 mg total) by mouth daily. 08/09/22   Verla Glaze, MD  zinc  sulfate 220 (50 Zn) MG capsule Take 1 capsule (220 mg total) by mouth  daily. 07/27/22   Verla Glaze, MD      Allergies    Morphine    Review of Systems   Review of Systems  Constitutional:  Negative for chills, diaphoresis, fatigue and fever.  HENT:  Negative for congestion, rhinorrhea and sneezing.   Eyes: Negative.   Respiratory:  Negative for cough, chest tightness and shortness of breath.   Cardiovascular:  Negative for chest pain and leg swelling.  Gastrointestinal:  Negative for abdominal pain, blood in stool, diarrhea, nausea and vomiting.  Genitourinary:  Negative for difficulty urinating, flank pain, frequency and hematuria.  Musculoskeletal:  Positive for myalgias. Negative for arthralgias and back pain.  Skin:  Negative for rash.  Neurological:  Negative for dizziness, speech difficulty, weakness, numbness and headaches.    Physical Exam Updated Vital Signs BP 119/77   Pulse 65   Temp 98.1 F (36.7 C) (Oral)   Resp 16   SpO2 99%  Physical Exam Constitutional:      Appearance: He is well-developed.  HENT:     Head: Normocephalic and atraumatic.  Eyes:     Pupils: Pupils are equal, round, and reactive to light.  Cardiovascular:     Rate and Rhythm: Normal rate and regular rhythm.     Heart sounds: Normal heart sounds.  Pulmonary:     Effort: Pulmonary effort is normal. No respiratory distress.  Breath sounds: Normal breath sounds. No wheezing or rales.  Chest:     Chest wall: No tenderness.  Abdominal:     General: Bowel sounds are normal.     Palpations: Abdomen is soft.     Tenderness: There is no abdominal tenderness. There is no guarding or rebound.  Musculoskeletal:        General: Normal range of motion.     Cervical back: Normal range of motion and neck supple.     Comments: Some mild tenderness on palpation of the arms.  No swelling.  No warmth or erythema.  Distal pulses are intact.  Lymphadenopathy:     Cervical: No cervical adenopathy.  Skin:    General: Skin is warm and dry.     Findings: No rash.   Neurological:     Mental Status: He is alert and oriented to person, place, and time.     ED Results / Procedures / Treatments   Labs (all labs ordered are listed, but only abnormal results are displayed) Labs Reviewed  BASIC METABOLIC PANEL WITH GFR - Abnormal; Notable for the following components:      Result Value   Chloride 113 (*)    Creatinine, Ser 1.70 (*)    GFR, Estimated 41 (*)    All other components within normal limits  CBC - Abnormal; Notable for the following components:   Hemoglobin 12.0 (*)    HCT 37.4 (*)    All other components within normal limits  TROPONIN I (HIGH SENSITIVITY) - Abnormal; Notable for the following components:   Troponin I (High Sensitivity) 29 (*)    All other components within normal limits  TROPONIN I (HIGH SENSITIVITY)    EKG EKG Interpretation Date/Time:  Tuesday January 21 2024 14:14:33 EDT Ventricular Rate:  60 PR Interval:  184 QRS Duration:  90 QT Interval:  400 QTC Calculation: 400 R Axis:   78  Text Interpretation:  Poor data quality, interpretation may be adversely affected Normal sinus rhythm Nonspecific T wave abnormality Abnormal ECG When compared with ECG of 13-Mar-2023 10:44, PREVIOUS ECG IS PRESENT since last tracing no significant change Confirmed by Hershel Los [phone]) on 01/21/2024 2:31:04 PM  Radiology No results found.  Procedures Procedures    Medications Ordered in ED Medications - No data to display  ED Course/ Medical Decision Making/ A&P                                 Medical Decision Making Amount and/or Complexity of Data Reviewed Labs: ordered. Radiology: ordered.   Patient is a 78 year old who presents with pain in his arms.  His wife is concerned because he had a cardiac arrest about 2 years ago and she wanted to make sure his heart was okay.  He says he has just some mild discomfort in his arms currently.  No chest pain.  No shortness of breath.  He does not seem to have exertional  symptoms although history is somewhat limited due to his dementia.  His pain is somewhat reproducible on palpation of his arms.  His EKG does not show any ischemic changes.  His first troponin is mildly elevated but similar to prior values.  Will check a second.  Chest x-ray was interpreted by me to show no acute abnormalities.  Awaiting radiology interpretation.  If his second troponin is stable, he can likely be discharged home with follow-up with his PCP.  Care turned over to Dr. Efraim Grange.  Final Clinical Impression(s) / ED Diagnoses Final diagnoses:  Myalgia    Rx / DC Orders ED Discharge Orders     None         Hershel Los, MD 01/21/24 [phone]

## 2024-01-21 NOTE — Hospital Course (Addendum)
 You came to the hospital for chest pain and you were diagnosed with an NSTEMI .  We treated you with IV blood thinning medications.    *For your NSTEMI -We have started you on these following medications:  -  -  -  - -We have stopped the following medications:  -  -  -  -Please see ***in 7 to 10 days   *For your *** -We have started you on these following medications:  -  -  -  - -We have stopped the following medications:  -  -  -  -Please see ***in 7 to 10 days    Follow-up appointments: Please visit your family doctor in 7 to 10 days Please visit *** (*** doctor) in 7-10 days  If you have any questions or concerns please feel free to call: Internal medicine clinic at [phone]   If you have any of these following symptoms, please call us  or seek care at an emergency department: -Chest Pain -Difficulty Breathing -Worsening *** pain -Syncope (passing out) -Drooping of face -Slurred speech -Sudden weakness in your leg or arm -Fever -Chills   We are glad that you are feeling better, it was a pleasure to care for you!  Aurora Lees DO

## 2024-01-21 NOTE — H&P (Signed)
 Date: 01/21/2024         Patient Name:  Carl Figueroa MRN: [id number]  DOB: November 18, 1945 Age / Sex: 78 y.o., male   PCP: Eartha Gold, MD         Medical Service: Internal Medicine Teaching Service         Attending Physician: Dr. Cherylene Corrente, MD    First Contact: Dr. Sharlon Deacon Pager: [phone]  Second Contact: Dr. Hayes Lipps Pager: [phone]                 Chief Concern: Chest and arm pain  History of Present Illness: [name] is a 78 yo M with pertinent PMH of HTN, CKD 3a, mild HLD, Lewy body dementia, Parkinson, anxiety, and PTSD who presented with chest pressure and bilateral shoulder pain. Mr. Malerba reports being in his usual state of health until this morning. He was participating in an aerobic dance class at [facility] when he experienced right shoulder pain. This also migrated to the left, and he said it felt like his shoulders "fell asleep." The sensation migrated to his right arm. He then began experiencing chest pressure, which he endorsed felt like someone was "standing on [his] chest." He stopped his exercise class and reported that these symptoms alleviated somewhat after a few minutes of rest. His wife encouraged him to seek care with the on-site nursing staff, who found his blood pressures elevated and encouraged him to call EMS. Throughout this experience, he reported feeling anxiety but never that he was having a "heart attack." He reports that he has experienced chest pressure before, but it has never been this severe or limited his daily activities.  At the time of our interview, he reported no chest pain or pressure. He endorsed nonproductive cough at night, which he and his wife attributed to allergies. He denied fever, chills, nausea, diaphoresis, recent illness, urinary changes, diarrhea, and vomiting.   Of note, his wife shared that he had a 20+ minute episode of cardiac arrest during a hospitalization in 2023 when an air bubble in his  stomach "exploded."  ED Course: ASA 324 mg and sublingual nitroglycerine administered in the field by EMS.  Allergies: Morphine causes nausea Dust mites Seasonal allergies  Past Medical History: HTN HLD  Lewy body dementia TBI Falls Gait abnormality  CKD Stage 3a Anemia Dyslipidemia BPH  Sacral decubitus ulcer  Past Surgical History: Left hip arthoplasty Colectomy and colostomy present  PTSD Anxiety Depression  Medications: Loratidine 10 mg daily Melatonin 10 mg nightly Rivastigmine  patch every morning Lexapro  not currently taking  Of note, patient used to take anti-hypertensive agents and finasteride  for BPH but discontinued with diagnosis of Lewy body dementia in 2020.   Family History:  Family History  Problem Relation Age of Onset   Cancer Mother    Diabetes Mellitus II Neg Hx    Social History:  Lives in [place] with his wife. Veteran Garment/textile technologist, jumped out of planes, served in four wars), retired Firefighter. Attends day programming at [employer]. Enjoys TV and chess.   Requires assistance with ADLs and iADLs with 24-hour aid. He uses a Rolator for fall prevention.  Physical Exam: Blood pressure (!) 142/67, pulse 67, temperature 97.7 F (36.5 C), temperature source Oral, resp. rate 11, SpO2 100%. General: Well-appearing, well-nourished older gentleman in no acute distress. Neck: tenderness to right SCM CV: RRR, no murmur appreciated, normal radial pulses bilaterally, TTP right lower sternal border Pulm: normal work  of breathing on RA, lungs CTAB Abdominal: colostomy in place with normal brown stool, non-tender Extremities: no pitting edema of b/l lower extremities appreciated Skin: warm and dry Neuro: resting tremor of right hand, speech quiet and monotonous Psych: normal mood, intermittently reserved, constricted affect  EKG:  Sinus rhythm without ST changes, unchanged from prior.  CXR:  Elevated left hemidiaphragm, no evidence  of cardiopulmonary disease.  Labs: CBC: Hgb 12, otherwise wnl. BMP: Cr 1.70, GFR 41, otherwise wnl.  Troponin: 29 then 113  Assessment & Plan:  Carl Figueroa is a 78 y.o. with PMH HTN, HLD, CKD 3a, and Lewy body dementia who presented with chest pain typical for angina and is admitted for NSTEMI.  Principal Problem:   NSTEMI (non-ST elevated myocardial infarction) (HCC) Active Problems:   BPH (benign prostatic hyperplasia)   Lewy body dementia (HCC)   Chronic kidney disease, stage 3a (HCC)   Falls   NSVT (nonsustained ventricular tachycardia) (HCC)  NSTE-ACS Patient experienced new-onset chest pressure and bilateral arm pain while exerting himself, which relieved some with rest. After ASA and nitroglycerin, pain remains resolved. Cardiology diagnosed NSTEMI and recommended conservative management with no catheterization indicated at this time. - Telemetry - Cardiology consulted; appreciate their recommendations - Heparin infusion ordered - TTE ordered  - Repeat troponin 2200  CKD Stage 3a GFR today 41, ranging from 45-60 recently. - BMP tomorrow AM  Hx of Non-Sustained Ventricular Tachycardia - Telemetry and echo as above  Anemia Since 06/2019, hemoglobin ranging from 7.5 to 13.5. Most recent (09/2023) iron panel wnl and ferritin 58. Possibly ACD iso CKD. 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 - Replace home rivastigmine  patch tomorrow morning - Deferring fall risk status at this point  HTN History of anti-hypertensive medications but discontinued. - Routine vitals  BPH Patient used to take finasteride  but has discontinued. - Bladder scan  Anxiety Depression PTSD Patient endorses PTSD and some symptoms consistent with agoraphobia. He used to take Lexapro  for anxiety but discontinued. - Consider low dose Ativan  for acute situational anxiety  Pressure Ulcers - Continue to  monitor  Level of care: Cardiac telemetry Diet: Regular IVF: None VTE: Therapeutic heparin transfusion Code: Full Surrogate: Wife Mrs. Arnell Lange at bedside  Signed Lynnea Satchel  Attestation for Student Documentation:  I personally was present and performed or re-performed the history, physical exam and medical decision-making activities of this service and have verified that the service and findings are accurately documented in the student's note.  78 year old with hypertension, CKD 3A, and Lewy body dementia came to the hospital for chest pain typical for angina and is admitted for NSTEMI.  Principal Problem:   NSTEMI (non-ST elevated myocardial infarction) (HCC) Fairly typical anginal type chest pain, central pressure radiating to the shoulder, worse with exertion, got better after he sat down and took some nitroglycerin.  No EKG changes.  Troponin from 20 => 100.  He is chest pain-free at present.  He has hypertension and mild hyperlipidemia.  He has not been on cardiac risk modifying medication since his diagnosis of Lewy body dementia, i.e. no antihypertensives no statins, etc.  Cardiology saw this patient in the ED, appreciate their insight.  Will continue a heparin infusion for now.  Clinically looks well but I will repeat a troponin anyway.  I will get an echo.  Admit to cardiac telemetry floor.  Active Problems:   NSVT He has a history of  nonsustained V. tach by ambulatory cardiac monitoring.  He had a brief episode that lasted a few seconds during my evaluation.  He was asymptomatic.  His electrolytes are okay.  Echo per above, will check for new structural heart disease.  Monitor on telemetry.    BPH (benign prostatic hyperplasia) Used to be on finasteride .  Does not take this anymore.  Will check a postvoid residual bladder scan since this person is at high risk for delirium while hospitalized.    Lewy body dementia (HCC)   Falls With parkinsonian features.  Continue  outpatient rivastigmine .    Chronic kidney disease, stage 3a (HCC) Stable.  eGFR today is 41.  Repeat BMP tomorrow.  Adria Hopkins MD 01/21/2024, 10:16 PM

## 2024-01-21 NOTE — ED Notes (Signed)
 Pt ambulated with two person assist to bathroom. Pt maintained steady gait with no complaints of lightheadness or dizinesss. Ostomy bag emptied.

## 2024-01-21 NOTE — ED Triage Notes (Signed)
 Pt coming from [facility] .Pt c.o chest discomfort radiating down his left arm with tingling.  Pt given 324 ASA and 1 nitroglycerin with relief of his pain. VSS

## 2024-01-21 NOTE — ED Notes (Signed)
 CCMD contacted for cardiac monitoring

## 2024-01-21 NOTE — ED Provider Notes (Signed)
  Physical Exam  BP 126/82 (BP Location: Left Arm)   Pulse (!) 56   Temp 98 F (36.7 C) (Oral)   Resp 11   SpO2 100%   Physical Exam  Procedures  Procedures  ED Course / MDM   Clinical Course as of 01/21/24 1625  Tue Jan 21, 2024  1624 Assumed care from Dr Nolia Baumgartner. 78 yo M with hx of HTN and dementia who presented with arm pain bilaterally. No CP or SOB. Unreliable historian. EKG looks ok. Troponin similar to prior. Needs 2nd troponin and should be able to go home if normal.  [RP]    Clinical Course User Index [RP] Ninetta Basket, MD   Medical Decision Making Amount and/or Complexity of Data Reviewed Labs: ordered. Radiology: ordered.   ***

## 2024-01-21 NOTE — Consult Note (Signed)
 Cardiology Consultation   Patient ID: Carl Figueroa MRN: [id number]; DOB: [birth year]  Admit date: 01/21/2024 Date of Consult: 01/21/2024  PCP:  Eartha Gold, MD   [facility] Providers Cardiologist:  Sammy Crisp, MD        Patient Profile: Ayeden Gladman is a 78 y.o. male being seen to evaluate elevated troponin in the setting of left arm pain possible chest pain compatible with mild non-ST elevation myocardial infarction  History of Present Illness: Mr. Mathieu is a 78 year old male with underlying dementia, traumatic brain injury, lives at [facility], with prior history of urosepsis elevated troponins in that setting with previous nuclear stress test showing no ischemia or scar in 2020 who came in today with complaints of possible chest pain.  He had physical therapy where he was moving his arms quite a bit and stated that he felt some discomfort in his left arm.  Troponins were drawn and were mildly elevated at approximately 100.  Prior echocardiogram in 2020 showed ejection fraction of 65% with no significant valvular abnormalities he has been diagnosed in the past with chronic diastolic dysfunction and lower leg edema for which he took Lasix .  Has had some dizziness and orthostasis in the past.  He was previously on atorvastatin  as well for hyperlipidemia.  He was last seen in our clinic setting in 2023.  According to his wife he is at a prior cardiac arrest event as a result of pneumoperitoneum and infection.  He still has an ostomy bag from this event.  He was out for 20-minute she states.  He had 1 surgery while he was on the ventilator.  He is a [place] patient, retired Cytogeneticist.  His wife is a Energy manager. Of theology.  Currently appears comfortable, in no distress.     Past Medical History:  Diagnosis Date   Anxiety    Arthritis    Hypertension    Lewy body dementia (HCC)    TBI (traumatic brain injury) (HCC)     Past Surgical History:   Procedure Laterality Date   COLON SURGERY     COLONOSCOPY     COLOSTOMY Right    FOOT SURGERY     X2   HERNIA REPAIR Right    INGUINAL    IR RADIOLOGIST EVAL & MGMT  08/08/2022   perforated bowel  05/2022   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   Procedure: TOTAL HIP ARTHROPLASTY ANTERIOR APPROACH;  Surgeon: Molli Angelucci, MD;  Location: [facility] ORS;  Service: Orthopedics;  Laterality: Left;     Home Medications:  Prior to Admission medications   Medication Sig Start Date End Date Taking? Authorizing Provider  loratadine  (CLARITIN ) 10 MG tablet Take 10 mg by mouth daily as needed for allergies. 04/20/22 01/21/24 Yes [provider]  Melatonin 5 MG CAPS Take 2 capsules by mouth at bedtime.   Yes [provider]  rivastigmine  (EXELON ) 4.6 mg/24hr Place 4.6 mg onto the skin daily. 12/06/22 01/24/24 Yes [provider]  escitalopram  (LEXAPRO ) 5 MG tablet Take 5 mg by mouth daily. Patient not taking: Reported on 01/21/2024 01/23/23   [provider]    Scheduled Meds:  Continuous Infusions:  PRN Meds:   Allergies:    Allergies  Allergen Reactions   Morphine Nausea Only    Social History:   Social History   Socioeconomic History   Marital status: Married    Spouse name: Not on file   Number of children: Not on file   Years  of education: Not on file   Highest education level: Not on file  Occupational History   Not on file  Tobacco Use   Smoking status: Former    Current packs/day: 0.00    Average packs/day: 2.0 packs/day for 20.0 years (40.0 ttl pk-yrs)    Types: Cigarettes    Start date: 10/22/1959    Quit date: 10/22/1979    Years since quitting: 44.2   Smokeless tobacco: Never  Vaping Use   Vaping status: Never Used  Substance and Sexual Activity   Alcohol use: No   Drug use: No   Sexual activity: Not on file  Other Topics Concern   Not on file  Social History Narrative   Not on file   Social Drivers of Health   Financial  Resource Strain: Not on file  Food Insecurity: No Food Insecurity (03/13/2023)   Hunger Vital Sign    Worried About Running Out of Food in the Last Year: Never true    Ran Out of Food in the Last Year: Never true  Transportation Needs: No Transportation Needs (03/13/2023)   PRAPARE - Administrator, Civil Service (Medical): No    Lack of Transportation (Non-Medical): No  Physical Activity: Not on file  Stress: Not on file  Social Connections: Not on file  Intimate Partner Violence: Not At Risk (03/13/2023)   Humiliation, Afraid, Rape, and Kick questionnaire    Fear of Current or Ex-Partner: No    Emotionally Abused: No    Physically Abused: No    Sexually Abused: No    Family History:    Family History  Problem Relation Age of Onset   Cancer Mother    Diabetes Mellitus II Neg Hx      ROS:  Please see the history of present illness.   All other ROS reviewed and negative.     Physical Exam/Data: Vitals:   01/21/24 1730 01/21/24 1745 01/21/24 1800 01/21/24 1815  BP:   (!) 151/64   Pulse: (!) 51 (!) 52  61  Resp: (!) 9 18 20    Temp:      TempSrc:      SpO2: 100% 100%  94%   No intake or output data in the 24 hours ending 01/21/24 1836    03/25/2023   11:10 AM 03/14/2023    5:00 AM 03/13/2023   10:38 AM  Last 3 Weights  Weight (lbs) 160 lb 4.8 oz 148 lb 9.4 oz 160 lb  Weight (kg) 72.712 kg 67.4 kg 72.576 kg     There is no height or weight on file to calculate BMI.  General:  Well nourished, well developed, in no acute distress HEENT: normal Neck: no JVD Vascular: No carotid bruits; Distal pulses 2+ bilaterally Cardiac:  normal S1, S2; RRR; no murmur  Lungs:  clear to auscultation bilaterally, no wheezing, rhonchi or rales  Abd: soft, nontender, no hepatomegaly, ostomy bag in place Ext: No extremity edema Musculoskeletal:  No deformities, BUE and BLE strength normal and equal Skin: warm and dry  Neuro: Nonfocal Psych:  Normal affect   EKG:  The EKG was  personally reviewed and demonstrates: Sinus rhythm rate 60 with nonspecific T wave changes, baseline artifact.  No significant changes from prior ECG.  Telemetry:  Telemetry was personally reviewed and demonstrates: No adverse arrhythmias  Relevant CV Studies: Prior echo normal EF, prior nuclear stress test no scar in 2020 no ischemia.  Laboratory Data: High Sensitivity Troponin:   Recent Labs  Lab 01/21/24 1418 01/21/24 1620  TROPONINIHS 29* 102*     Chemistry Recent Labs  Lab 01/21/24 1418  NA 144  K 4.6  CL 113*  CO2 26  GLUCOSE 92  BUN 18  CREATININE 1.70*  CALCIUM  9.6  GFRNONAA 41*  ANIONGAP 5    No results for input(s): "PROT", "ALBUMIN", "AST", "ALT", "ALKPHOS", "BILITOT" in the last 168 hours. Lipids No results for input(s): "CHOL", "TRIG", "HDL", "LABVLDL", "LDLCALC", "CHOLHDL" in the last 168 hours.  Hematology Recent Labs  Lab 01/21/24 1418  WBC 6.3  RBC 4.26  HGB 12.0*  HCT 37.4*  MCV 87.8  MCH 28.2  MCHC 32.1  RDW 13.1  PLT 169   Thyroid  No results for input(s): "TSH", "FREET4" in the last 168 hours.  BNPNo results for input(s): "BNP", "PROBNP" in the last 168 hours.  DDimer No results for input(s): "DDIMER" in the last 168 hours.  Radiology/Studies:  DG Chest 2 View Result Date: 01/21/2024 CLINICAL DATA:  Chest pain EXAM: CHEST - 2 VIEW COMPARISON:  Chest radiograph dated 08/06/2022 FINDINGS: Asymmetric elevation of the left hemidiaphragm. Normal lung volumes. No focal consolidations. No pleural effusion or pneumothorax. The heart size and mediastinal contours are within normal limits. No acute osseous abnormality. IMPRESSION: No active cardiopulmonary disease. Electronically Signed   By: Limin  Xu M.D.   On: 01/21/2024 16:21     Assessment and Plan:  78 year old male here with elevated troponin 102With presenting symptom of chest pain.  Has underlying dementia traumatic brain injury prior sepsis due to perforated bowel, hypertension.  Elevated  troponin 101 with chest pain, non-ST elevation myocardial infarction -Given his underlying medical condition and challenging to obtain history, all from wife who stated that he was complaining of some pain in both of his arms earlier today and did not have any pain across his chest but it is worse when he moves his arms and he did just have physical therapy using his arms lifting with no cough no diaphoresis no recent fevers, I would treat this conservatively.  Lives at [place]. -It is not unreasonable to place on IV heparin for the next 24 hours.  I would not advocate for invasive therapy. - Check echocardiogram to ensure continued proper structure and function and add any goal-directed medical therapy if necessary. -His elevated troponin may be secondary to a possible undiscovered medical issue, i.e. previously had urosepsis.  We will continue to follow.      Risk Assessment/Risk Scores:    TIMI Risk Score for Unstable Angina or Non-ST Elevation MI:   The patient's TIMI risk score is  , which indicates a  % risk of all cause mortality, new or recurrent myocardial infarction or need for urgent revascularization in the next 14 days.         For questions or updates, please contact [facility] Please consult [email or web address] for contact info under    Signed, Dorothye Gathers, MD  01/21/2024 6:36 PM

## 2024-01-21 NOTE — Discharge Instructions (Addendum)
 You came to the hospital for chest pain and you were diagnosed with an NSTEMI .  We treated you with IV blood thinning medications.    *For your NSTEMI and high cholesterol  -We have started you on these following medications:  -Aspirin  81 mg, take one tablet daily  -Crestor 20 mg, take one tablet daily   Follow-up appointments: Please visit your family doctor in 7 to 10 days Please visit cardiologist (heart doctor) in 2-4 weeks, please call [phone] to set up an appointment  If you have any questions or concerns please feel free to call: Internal medicine clinic at [phone]   If you have any of these following symptoms, please call us  or seek care at an emergency department: -Chest Pain -Difficulty Breathing -Syncope (passing out) -Drooping of face -Slurred speech -Sudden weakness in your leg or arm -Fever -Chills   We are glad that you are feeling better, it was a pleasure to care for you!  Aurora Lees DO

## 2024-01-21 NOTE — Progress Notes (Signed)
 PHARMACY - ANTICOAGULATION CONSULT NOTE  Pharmacy Consult for heparin Indication: chest pain/ACS  Allergies  Allergen Reactions   Dust Mite Extract Other (See Comments)    -seasonal allergies   Morphine Nausea Only    Patient Measurements:    Vital Signs: Temp: 98 F (36.7 C) (06/03 1621) Temp Source: Oral (06/03 1621) BP: 151/64 (06/03 1800) Pulse Rate: 61 (06/03 1815)  Labs: Recent Labs    01/21/24 1418 01/21/24 1620  HGB 12.0*  --   HCT 37.4*  --   PLT 169  --   CREATININE 1.70*  --   TROPONINIHS 29* 102*    CrCl cannot be calculated (Unknown ideal weight.).   Medical History: Past Medical History:  Diagnosis Date   Anxiety    Arthritis    Hypertension    Lewy body dementia (HCC)    TBI (traumatic brain injury) (HCC)     Assessment: 35 YOM presenting with CP, elevated troponin, he is not on anticoagulation PTA, chronic anemia stable, plts wnl  Goal of Therapy:  Heparin level 0.3-0.7 units/ml Monitor platelets by anticoagulation protocol: Yes   Plan:  Heparin 4000 units IV x 1, and gtt at 900 units/hr F/u 8 hour heparin level F/u cards eval and recs  Trinidad Funk, PharmD, [facility] Clinical Pharmacist ED Pharmacist Phone # [phone] 01/21/2024 6:39 PM

## 2024-01-21 NOTE — ED Notes (Signed)
 Pt provided water and graham crackers. Pt wife states pt has lactose intolerance/allergy.

## 2024-01-22 DIAGNOSIS — I4729 Other ventricular tachycardia: Secondary | ICD-10-CM | POA: Diagnosis not present

## 2024-01-22 DIAGNOSIS — I214 Non-ST elevation (NSTEMI) myocardial infarction: Secondary | ICD-10-CM | POA: Diagnosis not present

## 2024-01-22 DIAGNOSIS — N1831 Chronic kidney disease, stage 3a: Secondary | ICD-10-CM | POA: Diagnosis not present

## 2024-01-22 DIAGNOSIS — F0280 Dementia in other diseases classified elsewhere without behavioral disturbance: Secondary | ICD-10-CM | POA: Diagnosis not present

## 2024-01-22 DIAGNOSIS — G3183 Dementia with Lewy bodies: Secondary | ICD-10-CM | POA: Diagnosis not present

## 2024-01-22 LAB — BASIC METABOLIC PANEL WITH GFR
Anion gap: 7 (ref 5–15)
BUN: 17 mg/dL (ref 8–23)
CO2: 24 mmol/L (ref 22–32)
Calcium: 9.3 mg/dL (ref 8.9–10.3)
Chloride: 112 mmol/L — ABNORMAL HIGH (ref 98–111)
Creatinine, Ser: 1.52 mg/dL — ABNORMAL HIGH (ref 0.61–1.24)
GFR, Estimated: 47 mL/min — ABNORMAL LOW (ref >60)
Glucose, Bld: 111 mg/dL — ABNORMAL HIGH (ref 70–99)
Potassium: 3.7 mmol/L (ref 3.5–5.1)
Sodium: 143 mmol/L (ref 135–145)

## 2024-01-22 LAB — HEPARIN LEVEL (UNFRACTIONATED)
Heparin Unfractionated: 0.7 [IU]/mL (ref 0.30–0.70)
Heparin Unfractionated: 0.65 [IU]/mL (ref 0.30–0.70)

## 2024-01-22 LAB — CBC
HCT: 36.5 % — ABNORMAL LOW (ref 39.0–52.0)
Hemoglobin: 11.7 g/dL — ABNORMAL LOW (ref 13.0–17.0)
MCH: 28 pg (ref 26.0–34.0)
MCHC: 32.1 g/dL (ref 30.0–36.0)
MCV: 87.3 fL (ref 80.0–100.0)
Platelets: 141 10*3/uL — ABNORMAL LOW (ref 150–400)
RBC: 4.18 MIL/uL — ABNORMAL LOW (ref 4.22–5.81)
RDW: 13 % (ref 11.5–15.5)
WBC: 5.3 10*3/uL (ref 4.0–10.5)
nRBC: 0 % (ref 0.0–0.2)

## 2024-01-22 LAB — LIPID PANEL
Cholesterol: 173 mg/dL (ref 0–200)
HDL: 55 mg/dL (ref >40)
LDL Cholesterol: 110 mg/dL — ABNORMAL HIGH (ref 0–99)
Total CHOL/HDL Ratio: 3.1 ratio
Triglycerides: 40 mg/dL (ref <150)
VLDL: 8 mg/dL (ref 0–40)

## 2024-01-22 LAB — MRSA NEXT GEN BY PCR, NASAL: MRSA by PCR Next Gen: NOT DETECTED

## 2024-01-22 MED ORDER — ASPIRIN 81 MG PO TBEC
81.0000 mg | DELAYED_RELEASE_TABLET | Freq: Every day | ORAL | Status: DC
  Administered 2024-01-22 – 2024-01-23 (×2): 81 mg via ORAL
  Filled 2024-01-22 (×2): qty 1

## 2024-01-22 MED ORDER — ORAL CARE MOUTH RINSE: 15.0000 mL | OROMUCOSAL | Status: DC | PRN

## 2024-01-22 MED ORDER — ROSUVASTATIN CALCIUM 20 MG PO TABS
20.0000 mg | ORAL_TABLET | Freq: Every day | ORAL | Status: DC
  Administered 2024-01-22 – 2024-01-23 (×2): 20 mg via ORAL
  Filled 2024-01-22 (×2): qty 1

## 2024-01-22 MED ORDER — QUETIAPINE FUMARATE 50 MG PO TABS
25.0000 mg | ORAL_TABLET | Freq: Once | ORAL | Status: AC | PRN
  Administered 2024-01-22: 25 mg via ORAL
  Filled 2024-01-22: qty 1

## 2024-01-22 NOTE — Progress Notes (Signed)
 Paged by patient's nurse Dana for delirium/agitation concerns. Assessed patient at bedside. Upon entering the room patient requested that providers enter one at a time. Patient was found sitting on the side of the bed attempting to send a text message. When asked orientation questions, patient was only oriented to self. Despite providing orientation statements repeatedly, patient was not able to recall why he was in the hospital. He gave his permission to complete a cardiovascular exam which was unremarkable. He remained hemodynamically stable throughout the interaction. This provider switched out with Dr. Abner Hoffman, who was able to discuss medication to help patient sleep. Patient was agreeable to medication as long as its indication was explained. Ordered 1x PO Seroquel  25 mg in addition to the melatonin 10 mg daily at bedtime to help patient sleep through the night and help minimize agitation in the setting of delirium. Discussed plan with Dana, who was able to administer the medication successfully. Will discuss with day team so they can assess whether they would like to add Seroquel  nightly or on an as needed basis.   Mulki Roesler Arellano Zameza, MD PGY-1 IMTS

## 2024-01-22 NOTE — TOC CM/SW Note (Signed)
 Transition of Care [facility]) - Inpatient Brief Assessment   Patient Details  Name: Carl Figueroa MRN: [id number] Date of Birth: 1946/02/14  [facility] CM/SW Contact:    Juliane Och, LCSW Phone Number: 01/22/2024, 2:18 PM   Clinical Narrative:  2:18 PM Per chart review, patient resides at [facility] with spouse. Patient has a PCP and insurance. Patient does not have SNF history. Patient has HH history with [facility]. Patient has DME history (rolling walker, 3 in 1, [facility]). TOC will continue to follow.  Transition of Care Asessment: Insurance and Status: Insurance coverage has been reviewed Patient has primary care physician: Yes Home environment has been reviewed: [facility] Prior level of function:: N/A Prior/Current Home Services: No current home services Social Drivers of Health Review: SDOH reviewed no interventions necessary Readmission risk has been reviewed: Yes Transition of care needs: transition of care needs identified, TOC will continue to follow

## 2024-01-22 NOTE — Progress Notes (Signed)
   Subjective: Carl Figueroa is a 78 yo M with pertinent PMH of HTN, CKD 3a, mild HLD, Lewy body dementia, Parkinson, anxiety, and PTSD who presented with chest pressure and bilateral shoulder pain and admitted for an NSTEMI.   Today, he reports feeling well and denies shortness of breath. He has some Left sided chest pressure present. He reported that the chest pain started with dancing and improved after he rested and received nitroglycerin.   Objective:  Vital signs in last 24 hours: Vitals:   01/22/24 1101 01/22/24 1200 01/22/24 1206 01/22/24 1208  BP:  111/74    Pulse:  63    Resp:  15    Temp: 98.1 F (36.7 C)   98 F (36.7 C)  TempSrc: Oral  Oral Oral  SpO2:  100%     Physical Exam: General:Sitting in bed, NAD, eating breakfast  Cardiac:RRR, no murmur appreciated  Pulmonary:clear to auscultate bilaterally, normal work of breathing on room air  Neuro:resting tremor present, awake, participates in conversation  Skin:warm and dry Psych:  normal mood and affect   Assessment/Plan:  Principal Problem:   NSTEMI (non-ST elevated myocardial infarction) (HCC) Active Problems:   BPH (benign prostatic hyperplasia)   Lewy body dementia (HCC)   Chronic kidney disease, stage 3a (HCC)   Falls   NSVT (nonsustained ventricular tachycardia) (HCC)  NSTEMI Patient presented with typical chest pain, troponin that peaked at 102 and decreased to 37, and an EKG without ST segment elevation. He appears well and is hemodynamically stable. Overnight, he was started on Heparin therapy due to not being a catheterization candidate.  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   Normocytic Anemia Hgb appears stable with baseline ranging from 11-12.  Anxiety Depression PTSD Patient endorses PTSD and some symptoms consistent with agoraphobia. He used to take Lexapro  for anxiety but discontinued. - Consider low dose Ativan  for acute situational anxiety  Resolved Problems:  __________________________________  Code Status: Full  VTE Prophylaxis:Heparin drip  Diet:Regular  IVF:N/A Barriers to Discharge: Medical treatment of NSTEMI  Dispo: Anticipated discharge in approximately 1 day(s).   Aurora Lees, DO 01/22/2024, 12:10 PM Please Page [phone]

## 2024-01-22 NOTE — Progress Notes (Addendum)
 PHARMACY - ANTICOAGULATION CONSULT NOTE  Pharmacy Consult for continuous infusion of unfractionated heparin. Indication: chest pain/ACS  Allergies  Allergen Reactions   Dust Mite Extract Other (See Comments)    -seasonal allergies   Morphine Nausea Only    Patient Measurements: Height: 5\' 7"  (170.2 cm) Weight: 68.7 kg (151 lb 7.3 oz) IBW/kg (Calculated) : 66.1 HEPARIN DW (KG): 68.7  Vital Signs: Temp: 98.7 F (37.1 C) (06/04 1224) Temp Source: Oral (06/04 1224) BP: 136/80 (06/04 1224) Pulse Rate: 56 (06/04 1224)  Labs: Recent Labs    01/21/24 1418 01/21/24 1620 01/21/24 1935 01/22/24 0325 01/22/24 1331  HGB 12.0*  --   --  11.7*  --   HCT 37.4*  --   --  36.5*  --   PLT 169  --   --  141*  --   HEPARINUNFRC  --   --   --  0.70 0.65  CREATININE 1.70*  --   --  1.52*  --   TROPONINIHS 29* 102* 37*  --   --     Estimated Creatinine Clearance: 37.4 mL/min (A) (by C-G formula based on SCr of 1.52 mg/dL (H)).   Medical History: Past Medical History:  Diagnosis Date   Anxiety    Arthritis    Hypertension    Lewy body dementia (HCC)    TBI (traumatic brain injury) (HCC)     Medications:  Scheduled:   aspirin  EC  81 mg Oral Daily   melatonin  10 mg Oral QHS   rivastigmine   4.6 mg Transdermal Daily   rosuvastatin  20 mg Oral Daily    Assessment: Heparin level was to have been timed collected for 1100 hours today. Was not collected until 1331 hours today, infusing at 850 units/hr. At which time the heparin level was reported as 0.65 units/mL, RN reports no problems with infusion, no signs or symptoms suggestive of bleeding.  Goal of Therapy:  Heparin level 0.3-0.7 units/ml    Plan:  Discussed with Dr.'s Hilty and Bender, who have both indicated to discontinue the heparin drip. Heparin has been discontinued, patient received ~24 of CIUFH. Have discontinued collection of future anti-Xa heparin levels as well as future CBC's.  Kenda Paula, PharmD,  CPP Clinical Pharmacist Practitioner 01/22/2024,3:19 PM

## 2024-01-22 NOTE — Progress Notes (Signed)
 PHARMACY - ANTICOAGULATION CONSULT NOTE  Pharmacy Consult for heparin Indication: chest pain/ACS  Allergies  Allergen Reactions   Dust Mite Extract Other (See Comments)    -seasonal allergies   Morphine Nausea Only    Patient Measurements:    Vital Signs: Temp: 98.1 F (36.7 C) (06/04 0100) Temp Source: Oral (06/04 0100) BP: 109/53 (06/04 0100) Pulse Rate: 57 (06/04 0100)  Labs: Recent Labs    01/21/24 1418 01/21/24 1620 01/21/24 1935  HGB 12.0*  --   --   HCT 37.4*  --   --   PLT 169  --   --   CREATININE 1.70*  --   --   TROPONINIHS 29* 102* 37*    CrCl cannot be calculated (Unknown ideal weight.).   Medical History: Past Medical History:  Diagnosis Date   Anxiety    Arthritis    Hypertension    Lewy body dementia (HCC)    TBI (traumatic brain injury) (HCC)     Assessment: 68 YOM presenting with CP, elevated troponin, he is not on anticoagulation PTA, chronic anemia stable, plts wnl  6/4 AM update: HL: 0.7 Hgb: 11.7 PLT : 141K   Goal of Therapy:  Heparin level 0.3-0.7 units/ml Monitor platelets by anticoagulation protocol: Yes    Plan:  Reduce heparin gtt to 850 units/hr F/u heparin level 12:00 Daily CBC, HL F/u cards eval and recs   Myrtie Leuthold BS, PharmD, BCPS Clinical Pharmacist 01/22/2024 3:32 AM  Contact: [phone] after 3 PM  "Be curious, not judgmental..." -Rumalda Counter

## 2024-01-22 NOTE — Progress Notes (Signed)
 DAILY PROGRESS NOTE   Patient Name: Carl Figueroa Date of Encounter: 01/22/2024 Cardiologist: Sammy Crisp, MD  Chief Complaint   Wants to go home  Patient Profile   Carl Figueroa is a 78 y.o. male being seen to evaluate elevated troponin in the setting of left arm pain possible chest pain compatible with mild non-ST elevation myocardial infarction   Subjective   No chest pain.  He wants to go home.  Troponin peak was 102 however subsequent was 37.  He denies any chest pain.  He is on IV heparin.  He had noticed some shortness of breath however that is improved.  2D echo is pending today.  Objective   Vitals:   01/22/24 0622 01/22/24 0700 01/22/24 0800 01/22/24 0900  BP:  (!) 113/49 (!) 99/56 (!) 131/56  Pulse: (!) 59 (!) 54 74 61  Resp: 18 18  17   Temp: 97.8 F (36.6 C)     TempSrc:      SpO2: 97% 93% 97% 100%    Intake/Output Summary (Last 24 hours) at 01/22/2024 0944 Last data filed at 01/22/2024 0900 Gross per 24 hour  Intake --  Output 661 ml  Net -661 ml   There were no vitals filed for this visit.  Physical Exam   General appearance: alert and no distress Lungs: clear to auscultation bilaterally Heart: regular rate and rhythm Extremities: extremities normal, atraumatic, no cyanosis or edema Neurologic: Mental status: Pleasantly demented  Inpatient Medications    Scheduled Meds:  aspirin  EC  81 mg Oral Daily   melatonin  10 mg Oral QHS   rivastigmine   4.6 mg Transdermal Daily   rosuvastatin  20 mg Oral Daily    Continuous Infusions:  heparin 850 Units/hr (01/22/24 0351)    PRN Meds:    Labs   Results for orders placed or performed during the hospital encounter of 01/21/24 (from the past 48 hours)  Basic metabolic panel     Status: Abnormal   Collection Time: 01/21/24  2:18 PM  Result Value Ref Range   Sodium 144 135 - 145 mmol/L   Potassium 4.6 3.5 - 5.1 mmol/L   Chloride 113 (H) 98 - 111 mmol/L   CO2 26 22 - 32 mmol/L    Glucose, Bld 92 70 - 99 mg/dL    Comment: Glucose reference range applies only to samples taken after fasting for at least 8 hours.   BUN 18 8 - 23 mg/dL   Creatinine, Ser 9.60 (H) 0.61 - 1.24 mg/dL   Calcium  9.6 8.9 - 10.3 mg/dL   GFR, Estimated 41 (L) >60 mL/min    Comment: (NOTE) Calculated using the CKD-EPI Creatinine Equation (2021)    Anion gap 5 5 - 15    Comment: Performed at [facility], [address] [place], [place] [address]  CBC     Status: Abnormal   Collection Time: 01/21/24  2:18 PM  Result Value Ref Range   WBC 6.3 4.0 - 10.5 K/uL   RBC 4.26 4.22 - 5.81 MIL/uL   Hemoglobin 12.0 (L) 13.0 - 17.0 g/dL   HCT 81.1 (L) 91.4 - 78.2 %   MCV 87.8 80.0 - 100.0 fL   MCH 28.2 26.0 - 34.0 pg   MCHC 32.1 30.0 - 36.0 g/dL   RDW 95.6 21.3 - 08.6 %   Platelets 169 150 - 400 K/uL   nRBC 0.0 0.0 - 0.2 %    Comment: Performed at [facility], [address], [place], [place] [address]  Troponin I (High Sensitivity)     Status: Abnormal   Collection Time: 01/21/24  2:18 PM  Result Value Ref Range   Troponin I (High Sensitivity) 29 (H) <18 ng/L    Comment: (NOTE) Elevated high sensitivity troponin I (hsTnI) values and significant  changes across serial measurements may suggest ACS but many other  chronic and acute conditions are known to elevate hsTnI results.  Refer to the "Links" section for chest pain algorithms and additional  guidance. Performed at [facility], [address] [place], [place] [address]   Troponin I (High Sensitivity)     Status: Abnormal   Collection Time: 01/21/24  4:20 PM  Result Value Ref Range   Troponin I (High Sensitivity) 102 (HH) <18 ng/L    Comment: CRITICAL RESULT CALLED TO, READ BACK BY AND VERIFIED WITH Sonna Dus, RN AT [phone] 06.03.25 D. BLU 1ST ATTEMPT AT 1757 (NOTE) Elevated high sensitivity troponin I (hsTnI) values and significant  changes across serial measurements may suggest ACS but many other  chronic and acute  conditions are known to elevate hsTnI results.  Refer to the "Links" section for chest pain algorithms and additional  guidance. Performed at [facility], [address], [place], [place] [address]   Troponin I (High Sensitivity)     Status: Abnormal   Collection Time: 01/21/24  7:35 PM  Result Value Ref Range   Troponin I (High Sensitivity) 37 (H) <18 ng/L    Comment: (NOTE) Elevated high sensitivity troponin I (hsTnI) values and significant  changes across serial measurements may suggest ACS but many other  chronic and acute conditions are known to elevate hsTnI results.  Refer to the "Links" section for chest pain algorithms and additional  guidance. Performed at [facility], [address], [place], [place] [address]   Heparin level (unfractionated)     Status: None   Collection Time: 01/22/24  3:25 AM  Result Value Ref Range   Heparin Unfractionated 0.70 0.30 - 0.70 IU/mL    Comment: (NOTE) The clinical reportable range upper limit is being lowered to >1.10 to align with the FDA approved guidance for the current laboratory assay.  If heparin results are below expected values, and patient dosage has  been confirmed, suggest follow up testing of antithrombin III levels. Performed at [facility], [address], [place], [place] [address]   CBC     Status: Abnormal   Collection Time: 01/22/24  3:25 AM  Result Value Ref Range   WBC 5.3 4.0 - 10.5 K/uL   RBC 4.18 (L) 4.22 - 5.81 MIL/uL   Hemoglobin 11.7 (L) 13.0 - 17.0 g/dL   HCT 46.9 (L) 62.9 - 52.8 %   MCV 87.3 80.0 - 100.0 fL   MCH 28.0 26.0 - 34.0 pg   MCHC 32.1 30.0 - 36.0 g/dL   RDW 41.3 24.4 - 01.0 %   Platelets 141 (L) 150 - 400 K/uL   nRBC 0.0 0.0 - 0.2 %    Comment: Performed at [facility], [address] [place], [place] [address]  Basic metabolic panel     Status: Abnormal   Collection Time: 01/22/24  3:25 AM  Result Value Ref Range   Sodium 143 135 - 145 mmol/L   Potassium  3.7 3.5 - 5.1 mmol/L   Chloride 112 (H) 98 - 111 mmol/L   CO2 24 22 -  32 mmol/L   Glucose, Bld 111 (H) 70 - 99 mg/dL    Comment: Glucose reference range applies only to samples taken after fasting for at least 8 hours.   BUN 17 8 - 23 mg/dL   Creatinine, Ser 1.61 (H) 0.61 - 1.24 mg/dL   Calcium  9.3 8.9 - 10.3 mg/dL   GFR, Estimated 47 (L) >60 mL/min    Comment: (NOTE) Calculated using the CKD-EPI Creatinine Equation (2021)    Anion gap 7 5 - 15    Comment: Performed at [facility], [address] [place], [place] [address]    ECG   Normal sinus rhythm at 60- Personally Reviewed  Telemetry   Sinus rhythm- Personally Reviewed  Radiology    DG Chest 2 View Result Date: 01/21/2024 CLINICAL DATA:  Chest pain EXAM: CHEST - 2 VIEW COMPARISON:  Chest radiograph dated 08/06/2022 FINDINGS: Asymmetric elevation of the left hemidiaphragm. Normal lung volumes. No focal consolidations. No pleural effusion or pneumothorax. The heart size and mediastinal contours are within normal limits. No acute osseous abnormality. IMPRESSION: No active cardiopulmonary disease. Electronically Signed   By: Limin  Xu M.D.   On: 01/21/2024 16:21    Cardiac Studies   Echo pending  Assessment   Principal Problem:   NSTEMI (non-ST elevated myocardial infarction) (HCC) Active Problems:   BPH (benign prostatic hyperplasia)   Lewy body dementia (HCC)   Chronic kidney disease, stage 3a (HCC)   Falls   NSVT (nonsustained ventricular tachycardia) (HCC)   Plan   Mr. Litsey had an elevated troponin to 102 however subsequent troponin was top normal.  He is on IV heparin.  He is felt to be a poor candidate for intervention given chronic kidney disease and dementia.  Medical therapy was recommended.  Will plan to continue 24-hour heparin and an echo is pending today.  Time Spent Directly with Patient:  I have spent a total of 25 minutes with the patient reviewing hospital notes, telemetry, EKGs, labs  and examining the patient as well as establishing an assessment and plan that was discussed personally with the patient.  > 50% of time was spent in direct patient care.  Length of Stay:  LOS: 1 day   Hazle Lites, MD, [facility], FNLA, FACP  [facility]  Medical Director of the [facility] Diplomate of the American Board of Clinical Lipidology Attending Cardiologist  Direct Dial: [phone]  Fax: [phone]  Website:  [email or web address] 01/22/2024, 9:44 AM

## 2024-01-22 NOTE — Progress Notes (Addendum)
 Pt's bed alarm was alarming, pt sitting on the side of the bed, unable to reorient, poor safety awareness. Attempted to call pt's spouse in order to reorient pt; pt's spouse stated that at night he is a problem. This RN attempted to help pt with his phone, pt raised his hand in a fist, assessed to be potentially violent. Bed alarm reset. Attempted to notify IM MD, stated they would soon be at the bedside.  Sonjia Durie, RN

## 2024-01-22 NOTE — ED Notes (Signed)
 Cardiology MD at bedside.

## 2024-01-23 ENCOUNTER — Other Ambulatory Visit (HOSPITAL_COMMUNITY): Payer: Self-pay

## 2024-01-23 ENCOUNTER — Inpatient Hospital Stay (HOSPITAL_COMMUNITY)

## 2024-01-23 DIAGNOSIS — G3183 Dementia with Lewy bodies: Secondary | ICD-10-CM

## 2024-01-23 DIAGNOSIS — F0280 Dementia in other diseases classified elsewhere without behavioral disturbance: Secondary | ICD-10-CM

## 2024-01-23 DIAGNOSIS — N1831 Chronic kidney disease, stage 3a: Secondary | ICD-10-CM

## 2024-01-23 DIAGNOSIS — I214 Non-ST elevation (NSTEMI) myocardial infarction: Secondary | ICD-10-CM | POA: Diagnosis not present

## 2024-01-23 LAB — ECHOCARDIOGRAM COMPLETE
Area-P 1/2: 2.99 cm2
Height: 67 in
S' Lateral: 1.9 cm
Weight: 2423.3 [oz_av]

## 2024-01-23 LAB — BASIC METABOLIC PANEL WITH GFR
Anion gap: 6 (ref 5–15)
BUN: 15 mg/dL (ref 8–23)
CO2: 23 mmol/L (ref 22–32)
Calcium: 9.4 mg/dL (ref 8.9–10.3)
Chloride: 115 mmol/L — ABNORMAL HIGH (ref 98–111)
Creatinine, Ser: 1.37 mg/dL — ABNORMAL HIGH (ref 0.61–1.24)
GFR, Estimated: 53 mL/min — ABNORMAL LOW (ref >60)
Glucose, Bld: 84 mg/dL (ref 70–99)
Potassium: 4.1 mmol/L (ref 3.5–5.1)
Sodium: 144 mmol/L (ref 135–145)

## 2024-01-23 LAB — MAGNESIUM: Magnesium: 2 mg/dL (ref 1.7–2.4)

## 2024-01-23 MED ORDER — ASPIRIN 81 MG PO TBEC: 81.0000 mg | DELAYED_RELEASE_TABLET | Freq: Every day | ORAL | 0 refills | Status: AC

## 2024-01-23 MED ORDER — ROSUVASTATIN CALCIUM 20 MG PO TABS: 20.0000 mg | ORAL_TABLET | Freq: Every day | ORAL | 0 refills | Status: DC

## 2024-01-23 NOTE — TOC Transition Note (Signed)
 Transition of Care [facility]) - Discharge Note   Patient Details  Name: Carl Figueroa MRN: [id number] Date of Birth: 05-Oct-1945  Transition of [facility]) CM/SW Contact:  Jeani Mill, RN Phone Number: 01/23/2024, 3:33 PM   Clinical Narrative:     Patient stable for discharge.  Spoke to wife, Bobbye Burrow, regarding transition needs.  Patient has used [facility] in the past and Bobbye Burrow wants to establish care again.  Shylise with [facility] accepted referral.  Wife asking about new rollator. Zack with [facility] stated patient received wheelchair in 2023.  Wife at bedside to transport home.  Address, Phone number and PCP verified.   Final next level of care: Home w Home Health Services Barriers to Discharge: Barriers Resolved   Patient Goals and CMS Choice Patient states their goals for this hospitalization and ongoing recovery are:: return home with wife CMS Medicare.gov Compare Post Acute Care list provided to:: Patient Represenative (must comment) Choice offered to / list presented to : Spouse      Discharge Placement               home        Discharge Plan and Services Additional resources added to the After Visit Summary for     Discharge Planning Services: CM Consult            DME Arranged: Walker rolling with seat DME Agency: [facility] [place] Date DME Agency Contacted: 01/23/24 Time DME Agency Contacted: [phone] Representative spoke with at DME Agency: emailed DME form to [facility] Arranged: PT HH Agency: [facility] Date HH Agency Contacted: 01/23/24 Time HH Agency Contacted: 1237 Representative spoke with at [facility] Agency: Senaida Dama  Social Drivers of Health (SDOH) Interventions SDOH Screenings   Food Insecurity: No Food Insecurity (01/22/2024)  Housing: Low Risk  (01/22/2024)  Transportation Needs: No Transportation Needs (01/22/2024)  Utilities: Not At Risk (01/22/2024)  Social Connections: Patient Declined (01/22/2024)  Tobacco Use: Medium Risk  (01/21/2024)     Readmission Risk Interventions    01/23/2024    2:08 PM  Readmission Risk Prevention Plan  Post Dischage Appt Complete  Medication Screening Complete  Transportation Screening Complete

## 2024-01-23 NOTE — Progress Notes (Signed)
 DAILY PROGRESS NOTE   Patient Name: Carl Figueroa Date of Encounter: 01/23/2024 Cardiologist: Sammy Crisp, MD  Chief Complaint   Confused overnight  Patient Profile   Carl Figueroa is a 78 y.o. male being seen to evaluate elevated troponin in the setting of left arm pain possible chest pain compatible with mild non-ST elevation myocardial infarction   Subjective   Echo remains pending today. Off heparin. BP/ HR too low to add BB.  Objective   Vitals:   01/22/24 2032 01/22/24 2300 01/23/24 0400 01/23/24 0740  BP: (!) 136/57 (!) 110/53 114/75 (!) 118/53  Pulse: 61 64 (!) 53   Resp: 12 18 17 13   Temp: 98.1 F (36.7 C) 98.5 F (36.9 C) 98.1 F (36.7 C) 98 F (36.7 C)  TempSrc: Oral Oral Oral Oral  SpO2: 100% 99% 100% 100%  Weight:      Height:        Intake/Output Summary (Last 24 hours) at 01/23/2024 0857 Last data filed at 01/22/2024 2300 Gross per 24 hour  Intake 767.29 ml  Output 1636 ml  Net -868.71 ml   Filed Weights   01/22/24 1224  Weight: 68.7 kg    Physical Exam   General appearance: alert and no distress Lungs: clear to auscultation bilaterally Heart: regular rate and rhythm Extremities: extremities normal, atraumatic, no cyanosis or edema Neurologic: Mental status: Pleasantly demented  Inpatient Medications    Scheduled Meds:  aspirin  EC  81 mg Oral Daily   melatonin  10 mg Oral QHS   rivastigmine   4.6 mg Transdermal Daily   rosuvastatin  20 mg Oral Daily    Continuous Infusions:    PRN Meds: mouth rinse   Labs   Results for orders placed or performed during the hospital encounter of 01/21/24 (from the past 48 hours)  Basic metabolic panel     Status: Abnormal   Collection Time: 01/21/24  2:18 PM  Result Value Ref Range   Sodium 144 135 - 145 mmol/L   Potassium 4.6 3.5 - 5.1 mmol/L   Chloride 113 (H) 98 - 111 mmol/L   CO2 26 22 - 32 mmol/L   Glucose, Bld 92 70 - 99 mg/dL    Comment: Glucose reference range  applies only to samples taken after fasting for at least 8 hours.   BUN 18 8 - 23 mg/dL   Creatinine, Ser 1.61 (H) 0.61 - 1.24 mg/dL   Calcium  9.6 8.9 - 10.3 mg/dL   GFR, Estimated 41 (L) >60 mL/min    Comment: (NOTE) Calculated using the CKD-EPI Creatinine Equation (2021)    Anion gap 5 5 - 15    Comment: Performed at [facility], [address], [place], [place] [address]  CBC     Status: Abnormal   Collection Time: 01/21/24  2:18 PM  Result Value Ref Range   WBC 6.3 4.0 - 10.5 K/uL   RBC 4.26 4.22 - 5.81 MIL/uL   Hemoglobin 12.0 (L) 13.0 - 17.0 g/dL   HCT 54.0 (L) 98.1 - 19.1 %   MCV 87.8 80.0 - 100.0 fL   MCH 28.2 26.0 - 34.0 pg   MCHC 32.1 30.0 - 36.0 g/dL   RDW 47.8 29.5 - 62.1 %   Platelets 169 150 - 400 K/uL   nRBC 0.0 0.0 - 0.2 %    Comment: Performed at [facility], [address], [place], [place] [address]  Troponin I (High Sensitivity)  Status: Abnormal   Collection Time: 01/21/24  2:18 PM  Result Value Ref Range   Troponin I (High Sensitivity) 29 (H) <18 ng/L    Comment: (NOTE) Elevated high sensitivity troponin I (hsTnI) values and significant  changes across serial measurements may suggest ACS but many other  chronic and acute conditions are known to elevate hsTnI results.  Refer to the "Links" section for chest pain algorithms and additional  guidance. Performed at [facility], [address], [place], [place] [address]   Troponin I (High Sensitivity)     Status: Abnormal   Collection Time: 01/21/24  4:20 PM  Result Value Ref Range   Troponin I (High Sensitivity) 102 (HH) <18 ng/L    Comment: CRITICAL RESULT CALLED TO, READ BACK BY AND VERIFIED WITH Sonna Dus, RN AT [phone] 06.03.25 D. BLU 1ST ATTEMPT AT 1757 (NOTE) Elevated high sensitivity troponin I (hsTnI) values and significant  changes across serial measurements may suggest ACS but many other  chronic and acute conditions are known to elevate hsTnI results.  Refer to the  "Links" section for chest pain algorithms and additional  guidance. Performed at [facility], [address], [place], [place] [address]   Troponin I (High Sensitivity)     Status: Abnormal   Collection Time: 01/21/24  7:35 PM  Result Value Ref Range   Troponin I (High Sensitivity) 37 (H) <18 ng/L    Comment: (NOTE) Elevated high sensitivity troponin I (hsTnI) values and significant  changes across serial measurements may suggest ACS but many other  chronic and acute conditions are known to elevate hsTnI results.  Refer to the "Links" section for chest pain algorithms and additional  guidance. Performed at [facility], [address] [place], [place] [address]   Heparin level (unfractionated)     Status: None   Collection Time: 01/22/24  3:25 AM  Result Value Ref Range   Heparin Unfractionated 0.70 0.30 - 0.70 IU/mL    Comment: (NOTE) The clinical reportable range upper limit is being lowered to >1.10 to align with the FDA approved guidance for the current laboratory assay.  If heparin results are below expected values, and patient dosage has  been confirmed, suggest follow up testing of antithrombin III levels. Performed at [facility], [address], [place], [place] [address]   CBC     Status: Abnormal   Collection Time: 01/22/24  3:25 AM  Result Value Ref Range   WBC 5.3 4.0 - 10.5 K/uL   RBC 4.18 (L) 4.22 - 5.81 MIL/uL   Hemoglobin 11.7 (L) 13.0 - 17.0 g/dL   HCT 82.9 (L) 56.2 - 13.0 %   MCV 87.3 80.0 - 100.0 fL   MCH 28.0 26.0 - 34.0 pg   MCHC 32.1 30.0 - 36.0 g/dL   RDW 86.5 78.4 - 69.6 %   Platelets 141 (L) 150 - 400 K/uL   nRBC 0.0 0.0 - 0.2 %    Comment: Performed at [facility], [address], [place], [place] [address]  Basic metabolic panel     Status: Abnormal   Collection Time: 01/22/24  3:25 AM  Result Value Ref Range   Sodium 143 135 - 145 mmol/L   Potassium 3.7 3.5 - 5.1 mmol/L   Chloride 112 (H) 98 - 111 mmol/L    CO2 24 22 - 32 mmol/L   Glucose, Bld 111 (H) 70 - 99 mg/dL    Comment: Glucose reference  range applies only to samples taken after fasting for at least 8 hours.   BUN 17 8 - 23 mg/dL   Creatinine, Ser 1.61 (H) 0.61 - 1.24 mg/dL   Calcium  9.3 8.9 - 10.3 mg/dL   GFR, Estimated 47 (L) >60 mL/min    Comment: (NOTE) Calculated using the CKD-EPI Creatinine Equation (2021)    Anion gap 7 5 - 15    Comment: Performed at [facility], [address], [place], [place] [address]  Lipid panel     Status: Abnormal   Collection Time: 01/22/24  3:25 AM  Result Value Ref Range   Cholesterol 173 0 - 200 mg/dL   Triglycerides 40 <540 mg/dL   HDL 55 >98 mg/dL   Total CHOL/HDL Ratio 3.1 RATIO   VLDL 8 0 - 40 mg/dL   LDL Cholesterol 119 (H) 0 - 99 mg/dL    Comment:        Total Cholesterol/HDL:CHD Risk Coronary Heart Disease Risk Table                     Men   Women  1/2 Average Risk   3.4   3.3  Average Risk       5.0   4.4  2 X Average Risk   9.6   7.1  3 X Average Risk  23.4   11.0        Use the calculated Patient Ratio above and the CHD Risk Table to determine the patient's CHD Risk.        ATP III CLASSIFICATION (LDL):  <100     mg/dL   Optimal  147-829  mg/dL   Near or Above                    Optimal  130-159  mg/dL   Borderline  562-130  mg/dL   High  >865     mg/dL   Very High Performed at [facility], [address] [place], [place] [address]   MRSA Next Gen by PCR, Nasal     Status: None   Collection Time: 01/22/24 12:24 PM   Specimen: Nasal Mucosa; Nasal Swab  Result Value Ref Range   MRSA by PCR Next Gen NOT DETECTED NOT DETECTED    Comment: (NOTE) The GeneXpert MRSA Assay (FDA approved for NASAL specimens only), is one component of a comprehensive MRSA colonization surveillance program. It is not intended to diagnose MRSA infection nor to guide or monitor treatment for MRSA infections. Test performance is not FDA approved in patients less than 23  years old. Performed at [facility], [address] [place], [place] [address]   Heparin level (unfractionated)     Status: None   Collection Time: 01/22/24  1:31 PM  Result Value Ref Range   Heparin Unfractionated 0.65 0.30 - 0.70 IU/mL    Comment: (NOTE) The clinical reportable range upper limit is being lowered to >1.10 to align with the FDA approved guidance for the current laboratory assay.  If heparin results are below expected values, and patient dosage has  been confirmed, suggest follow up testing of antithrombin III levels. Performed at [facility], [address] [place], [place] [address]     ECG   N/A  Telemetry   Sinus rhythm- Personally Reviewed  Radiology    DG Chest 2 View Result Date: 01/21/2024 CLINICAL DATA:  Chest pain EXAM: CHEST - 2 VIEW COMPARISON:  Chest radiograph dated 08/06/2022 FINDINGS: Asymmetric elevation of the left hemidiaphragm. Normal lung volumes. No focal consolidations. No pleural effusion or pneumothorax. The heart size and mediastinal contours are within normal limits. No acute osseous abnormality. IMPRESSION: No active cardiopulmonary disease. Electronically Signed   By: Limin  Xu M.D.   On: 01/21/2024 16:21    Cardiac Studies   Echo pending  Assessment   Principal Problem:   NSTEMI (non-ST elevated myocardial infarction) (HCC) Active Problems:   BPH (benign prostatic hyperplasia)   Lewy body dementia (HCC)   Chronic kidney disease, stage 3a (HCC)   Falls   NSVT (nonsustained ventricular tachycardia) (HCC)   Plan   Carl Figueroa is not complaining of chest pain - peak trop of 102. Was on heparin for 24 hrs - now aspirin /statin. Consider BB if BP or HR higher. Agitated overnight. Echo pending - will follow-up, however, unlikely to recommend further work-up if LV function is preserved.  Time Spent Directly with Patient:  I have spent a total of 25 minutes with the patient reviewing hospital notes, telemetry,  EKGs, labs and examining the patient as well as establishing an assessment and plan that was discussed personally with the patient.  > 50% of time was spent in direct patient care.  Length of Stay:  LOS: 2 days   Hazle Lites, MD, [facility], FNLA, FACP  [place]  [facility]  Medical Director of the Advanced Lipid Disorders &  Cardiovascular Risk Reduction Clinic Diplomate of the American Board of Clinical Lipidology Attending Cardiologist  Direct Dial: [phone]  Fax: [phone]  Website:  [email or web address]  Hazle Lites 01/23/2024, 8:57 AM

## 2024-01-23 NOTE — Evaluation (Signed)
 Physical Therapy Evaluation Patient Details Name: Carl Figueroa MRN: [id number] DOB: Mar 31, 1946 Today's Date: 01/23/2024  History of Present Illness  Pt is 78 yo presenting to [facility] ED on 6/3 due to chest pressure and bil shoulder pain. Pt admitted for NSTEMI. PMH: HTN, CKD III, HLD, lew body dementia, parkinson, anxiety, PTSD.  Clinical Impression  Pt is presenting close to baseline level of functioning. Currently pt is Mod I for bed mobility and CGA for sit to stand and short home distance gait. Pt initially had very short steps with gait that were slightly unsteady which improved significantly with time on his feet. Due to pt current functional status, home set up and available assistance at home recommending skilled physical therapy services 3x/week in order to address strength, balance and functional mobility to decrease risk for falls, injury and re-hospitalization.           If plan is discharge home, recommend the following: A little help with walking and/or transfers;Help with stairs or ramp for entrance;Assistance with cooking/housework;Assist for transportation;Supervision due to cognitive status     Equipment Recommendations None recommended by PT     Functional Status Assessment Patient has had a recent decline in their functional status and demonstrates the ability to make significant improvements in function in a reasonable and predictable amount of time.     Precautions / Restrictions Precautions Precautions: Fall Recall of Precautions/Restrictions: Impaired Precaution/Restrictions Comments: poor safety awareness. Restrictions Weight Bearing Restrictions Per Provider Order: No      Mobility  Bed Mobility Overal bed mobility: Modified Independent             General bed mobility comments: pt in recline at end of session    Transfers Overall transfer level: Needs assistance Equipment used: Rolling walker (2 wheels) Transfers: Sit to/from Stand Sit to  Stand: Contact guard assist           General transfer comment: Rocking to get to standing 1x verbal cues for safe hand placement.    Ambulation/Gait Ambulation/Gait assistance: Contact guard assist Gait Distance (Feet): 50 Feet Assistive device: Rolling walker (2 wheels) Gait Pattern/deviations: Step-through pattern, Decreased stride length Gait velocity: decreased Gait velocity interpretation: <1.31 ft/sec, indicative of household ambulator   General Gait Details: Low foot clearance and initially very short step length. Improved with distance. Pt CGA  Stairs Stairs:  (demonstrates good strength to navigate home set up with light assistance as demonstrated by sit to stand and gait with RW.)            Balance Overall balance assessment: Mild deficits observed, not formally tested, History of Falls           Pertinent Vitals/Pain Pain Assessment Pain Assessment: No/denies pain    Home Living Family/patient expects to be discharged to:: Private residence Living Arrangements: Spouse/significant other Available Help at Discharge: Family;Available 24 hours/day;Personal care attendant Type of Home: House Home Access: Stairs to enter Entrance Stairs-Rails: Right Entrance Stairs-Number of Steps: 3   Home Layout: One level Home Equipment: Rollator (4 wheels);Rolling Walker (2 wheels);Grab bars - toilet;Grab bars - tub/shower Additional Comments: Pt states that he has a PCA that comes every day but he is unsure how often.    Prior Function Prior Level of Function : Needs assist  Cognitive Assist : Mobility (cognitive);ADLs (cognitive) Mobility (Cognitive): Step by step cues ADLs (Cognitive): Step by step cues Physical Assist : ADLs (physical)   ADLs (physical): Dressing;Grooming;Bathing;Toileting Mobility Comments: Ambulates at home with rollator. Reports that  he has fallen but is unsure of how many times. Goes to [employer] to work out ADLs Comments: Wife and [facility]  nurse assist with ADLs     Extremity/Trunk Assessment   Upper Extremity Assessment Upper Extremity Assessment: Overall WFL for tasks assessed;Generalized weakness    Lower Extremity Assessment Lower Extremity Assessment: Generalized weakness;Overall [facility] for tasks assessed    Cervical / Trunk Assessment Cervical / Trunk Assessment: Kyphotic  Communication   Communication Communication: Impaired Factors Affecting Communication: Hearing impaired    Cognition Arousal: Alert Behavior During Therapy: WFL for tasks assessed/performed   PT - Cognitive impairments: No apparent impairments       Following commands: Intact       Cueing Cueing Techniques: Verbal cues     General Comments General comments (skin integrity, edema, etc.): Hr remained in the 80's-90's throughout session. Difficult reading on O2 sat. No shortness of breathe noted.        Assessment/Plan    PT Assessment Patient needs continued PT services  PT Problem List Decreased strength;Decreased activity tolerance;Decreased balance;Decreased mobility       PT Treatment Interventions DME instruction;Balance training;Gait training;Stair training;Functional mobility training;Therapeutic activities;Therapeutic exercise;Patient/family education    PT Goals (Current goals can be found in the Care Plan section)  Acute Rehab PT Goals Patient Stated Goal: to go home PT Goal Formulation: With patient Time For Goal Achievement: 02/06/24 Potential to Achieve Goals: Good    Frequency Min 2X/week        AM-PAC PT "6 Clicks" Mobility  Outcome Measure Help needed turning from your back to your side while in a flat bed without using bedrails?: None Help needed moving from lying on your back to sitting on the side of a flat bed without using bedrails?: None Help needed moving to and from a bed to a chair (including a wheelchair)?: A Little Help needed standing up from a chair using your arms (e.g., wheelchair or  bedside chair)?: A Little Help needed to walk in hospital room?: A Little Help needed climbing 3-5 steps with a railing? : A Little 6 Click Score: 20    End of Session Equipment Utilized During Treatment: Gait belt Activity Tolerance: Patient tolerated treatment well;Patient limited by fatigue Patient left: in chair;with call bell/phone within reach;with chair alarm set Nurse Communication: Mobility status PT Visit Diagnosis: Other abnormalities of gait and mobility (R26.89);Unsteadiness on feet (R26.81)    Time: 0925-1006 PT Time Calculation (min) (ACUTE ONLY): 41 min   Charges:   PT Evaluation $PT Eval Low Complexity: 1 Low PT Treatments $Therapeutic Activity: 23-37 mins PT General Charges $$ ACUTE PT VISIT: 1 Visit         Sloan Duncans, DPT, CLT  Acute Rehabilitation Services Office: [phone] (Secure chat preferred)   Jenice Mitts 01/23/2024, 10:21 AM

## 2024-01-23 NOTE — Discharge Summary (Signed)
 Name: Carl Figueroa MRN: [id number] DOB: [birth year] 78 y.o. PCP: Eartha Gold, MD  Date of Admission: 01/21/2024  2:07 PM Date of Discharge: 01/23/2024 2:44 PM Attending Physician: Dr. Lelia Putnam  Discharge Diagnosis: Principal Problem:   NSTEMI (non-ST elevated myocardial infarction) [facility]) Active Problems:   BPH (benign prostatic hyperplasia)   Lewy body dementia (HCC)   Chronic kidney disease, stage 3a (HCC)   Falls   NSVT (nonsustained ventricular tachycardia) (HCC)    Discharge Medications: Allergies as of 01/23/2024       Reactions   Dust Mite Extract Other (See Comments)   -seasonal allergies   Morphine Nausea Only        Medication List     STOP taking these medications    loratadine  10 MG tablet Commonly known as: CLARITIN        TAKE these medications    aspirin  EC 81 MG tablet Take 1 tablet (81 mg total) by mouth daily. Swallow whole.   escitalopram  5 MG tablet Commonly known as: LEXAPRO  Take 5 mg by mouth daily.   Melatonin 5 MG Caps Take 2 capsules by mouth at bedtime.   rivastigmine  4.6 mg/24hr Commonly known as: EXELON  Place 4.6 mg onto the skin daily.   rosuvastatin 20 MG tablet Commonly known as: CRESTOR Take 1 tablet (20 mg total) by mouth daily.               Durable Medical Equipment  (From admission, onward)           Start     Ordered   01/23/24 0000  For home use only DME 4 wheeled rolling walker with seat       Question:  Patient needs a walker to treat with the following condition  Answer:  Gait instability   01/23/24 1326            Disposition and follow-up:   Carl Figueroa was discharged from [facility] in Stable condition.  At the hospital follow up visit please address:  1.  Follow-up:  *NSTEMI *HLD -Assess symptoms and compliance with Aspirin  81mg  and Crestor 20mg   -Ensure cardiology follow up  -Follow up echocardiogram results    2.  Labs / imaging  needed at time of follow-up: N/A  3.  Pending labs/ test needing follow-up: Echocardiogram results  4.  Medication Changes  STOPPED  -N/A   ADDED  -Aspirin  81 mg  -Crestor 20mg     MODIFIED  -N/A   Follow-up Appointments:  Follow-up Information     Eartha Gold, MD. Schedule an appointment as soon as possible for a visit in 2 week(s).   Specialty: Infectious Diseases Why: Hospital follow up Contact information: [address] [place] [address] [phone]         [facility]  Follow up.   Why: Home helath has been arranged. They will contact you to schedule apt within 48 hours post discharge. Contact information: [address] [place] [address] [phone]         [facility] clinic Follow up.   Why: Rollator ordered Contact information: [address] [place], [place] [address] [phone]                Hospital Course by problem list: NSTEMI HLD Patient presented with typical chest pain, troponin that peaked at 102 and decreased to 37, and an EKG without ST segment elevation. He appears well and is hemodynamically stable. He  was started on Heparin therapy due to not being a catheterization candidate. Additionally, he was discharged home with aspirin  and Crestor. Cardiology does not recommend DAPT due to increased fall risk. Our view of the Echocardiogram showed preserved EF. Formal read of echocardiogram will need follow up on.   Hx of Non-Sustained Ventricular Tachycardia No evidence of ventricular tachycardia on telemetry.  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     Discharge Subjective: Patient feels well this morning. He denies chest pain or shortness of breath.   Discharge Exam:   Blood pressure  (!) 118/53, pulse (!) 53, temperature 98 F (36.7 C), temperature source Oral, resp. rate 13, height 5\' 7"  (1.702 m), weight 68.7 kg, SpO2 100%.  Constitutional:In no acute distress, walking with walker with PT Cardiovascular: regular rate and rhythm, no m/r/g Pulmonary/Chest: normal work of breathing on room air Abdominal: soft, non-tender, non-distended. Neurological: alert & awake, moving all four extremities  MSK: no gross abnormalities.  Skin: warm and dry Psych: Normal mood and affect  Pertinent Labs, Studies, and Procedures:     Latest Ref Rng & Units 01/22/2024    3:25 AM 01/21/2024    2:18 PM 09/25/2023   11:15 AM  CBC  WBC 4.0 - 10.5 K/uL 5.3  6.3  4.5   Hemoglobin 13.0 - 17.0 g/dL 24.4  01.0  27.2   Hematocrit 39.0 - 52.0 % 36.5  37.4  35.8   Platelets 150 - 400 K/uL 141  169  157        Latest Ref Rng & Units 01/23/2024    8:25 AM 01/22/2024    3:25 AM 01/21/2024    2:18 PM  CMP  Glucose 70 - 99 mg/dL 84  536  92   BUN 8 - 23 mg/dL 15  17  18    Creatinine 0.61 - 1.24 mg/dL 6.44  0.34  7.42   Sodium 135 - 145 mmol/L 144  143  144   Potassium 3.5 - 5.1 mmol/L 4.1  3.7  4.6   Chloride 98 - 111 mmol/L 115  112  113   CO2 22 - 32 mmol/L 23  24  26    Calcium  8.9 - 10.3 mg/dL 9.4  9.3  9.6     DG Chest 2 View Result Date: 01/21/2024 CLINICAL DATA:  Chest pain EXAM: CHEST - 2 VIEW COMPARISON:  Chest radiograph dated 08/06/2022 FINDINGS: Asymmetric elevation of the left hemidiaphragm. Normal lung volumes. No focal consolidations. No pleural effusion or pneumothorax. The heart size and mediastinal contours are within normal limits. No acute osseous abnormality. IMPRESSION: No active cardiopulmonary disease. Electronically Signed   By: Limin  Xu M.D.   On: 01/21/2024 16:21     Discharge Instructions: Discharge Instructions     For home use only DME 4 wheeled rolling walker with seat   Complete by: As directed    Patient needs a walker to treat with the following condition: Gait  instability       Signed: Aurora Lees DO [facility] - PGY1 Pager: [phone] 01/23/2024, 2:44 PM    Please contact the on call pager after 5 pm and on weekends at [phone].

## 2024-01-24 ENCOUNTER — Other Ambulatory Visit: Payer: Self-pay | Admitting: Student

## 2024-01-24 NOTE — Progress Notes (Signed)
 Informed patient's spouse of echocardiogram results including a normal EF and a bicuspid aortic valve. Patient has a cardiology appointment scheduled per wife.  No changes to plan.

## 2024-02-07 ENCOUNTER — Encounter: Payer: Self-pay | Admitting: Physician Assistant

## 2024-02-07 ENCOUNTER — Ambulatory Visit: Attending: Medical | Admitting: Physician Assistant

## 2024-02-07 VITALS — BP 120/64 | HR 53 | Ht 67.0 in | Wt 155.6 lb

## 2024-02-07 DIAGNOSIS — I10 Essential (primary) hypertension: Secondary | ICD-10-CM | POA: Diagnosis present

## 2024-02-07 DIAGNOSIS — I5A Non-ischemic myocardial injury (non-traumatic): Secondary | ICD-10-CM | POA: Diagnosis present

## 2024-02-07 DIAGNOSIS — R072 Precordial pain: Secondary | ICD-10-CM | POA: Diagnosis present

## 2024-02-07 DIAGNOSIS — R0683 Snoring: Secondary | ICD-10-CM | POA: Insufficient documentation

## 2024-02-07 DIAGNOSIS — G4733 Obstructive sleep apnea (adult) (pediatric): Secondary | ICD-10-CM | POA: Diagnosis present

## 2024-02-07 DIAGNOSIS — I214 Non-ST elevation (NSTEMI) myocardial infarction: Secondary | ICD-10-CM | POA: Insufficient documentation

## 2024-02-07 NOTE — Progress Notes (Signed)
 Cardiology Office Note    Date:  02/07/2024   ID:  Carl Figueroa, Carl Figueroa Mar 16, 1946, MRN [id number]  PCP:  Eartha Gold, MD  Cardiologist:  Sammy Crisp, MD  Electrophysiologist:  None   Chief Complaint: Hospital follow up  History of Present Illness:   Carl Figueroa is a 78 y.o. male with history of underlying dementia, traumatic brain injury, chronic low back/leg pain, CKD IIIa, BPH, and hypertension who presents for hospital follow up.     Patient was hospitalized 06/2019 due to altered mental status and an episode of hematuria.  He was found to have sepsis.  As a part of this workup, troponins were checked and noted to be elevated, with peak being 180.  Echo at that time showed normal EF without wall motion abnormality.  Troponin elevation was attributed to supply/demand mismatch in the setting of this acute infection.  He underwent Lexiscan  MPI 08/2019 which did not show significant ischemia, EF 58%.  Patient was seen in our office 04/2022 reporting lower extremity swelling and constant dizziness.  He completed ZIO monitoring 05/2022 which showed predominantly sinus rhythm with NSVT and SVT.  Carotid Doppler negative bilaterally.  He missed his follow-up appointment due to being admitted at the [facility] ICU per phone note from wife.  He was subsequently lost to follow-up.   Patient presented to the ED 6/3 with complaints of left arm/chest pain.  Troponin peaked at 101.  He was felt to be a poor candidate for intervention given CKD and dementia. Conservative treatment for NSTEMI was recommended.  He received IV heparin  for 24 hours and was subsequently started on aspirin  and statin. Long term DAPT was not recommended in the setting of fall risk. Echo showed EF 70 to 75% with no RWMA, G1 DD, and bicuspid aortic valve.  Patient comes in to clinic today accompanied by his wife who assists with history in the setting of patient's baseline dementia. He is reporting ongoing  symptoms of intermittent episodes of discomfort in his bilateral shoulders radiating into his chest and neck. This typically comes on with exertion and occasionally with rest. It lasts from 30 minutes to one hour prior to resolving. He reports that he tries stretching which helps some with relieving the pain. He denies associated shortness of breath, nausea, and diaphoresis. Separately, his wife reports that she has noticed his breathing becomes more coarse when he is sleeping and that she thinks at times he briefly stops breathing. He denies palpitations, lightheadedness, dizziness, orthopnea, and lower extremity swelling.   Labs independently reviewed: 01/23/2024-NA 144, K4.1, BUN 15, creatinine 1.37 01/22/2024-TC 173, TG 40, HDL 55, LDL 110, Hgb 11.7, HCT 36.5, platelets 141  Objective   Past Medical History:  Diagnosis Date   Anxiety    Arthritis    Hypertension    Lewy body dementia (HCC)    TBI (traumatic brain injury) (HCC)     Current Medications: Current Meds  Medication Sig   aspirin  EC 81 MG tablet Take 1 tablet (81 mg total) by mouth daily. Swallow whole.   Melatonin 5 MG CAPS Take 2 capsules by mouth at bedtime.   rivastigmine  (EXELON ) 4.6 mg/24hr Place 4.6 mg onto the skin daily.   rosuvastatin  (CRESTOR ) 20 MG tablet Take 1 tablet (20 mg total) by mouth daily.    Allergies:   Dust mite extract and Morphine   Social History   Socioeconomic History   Marital status: Married    Spouse name: Not on file  Number of children: Not on file   Years of education: Not on file   Highest education level: Not on file  Occupational History   Not on file  Tobacco Use   Smoking status: Former    Current packs/day: 0.00    Average packs/day: 2.0 packs/day for 20.0 years (40.0 ttl pk-yrs)    Types: Cigarettes    Start date: 10/22/1959    Quit date: 10/22/1979    Years since quitting: 44.3   Smokeless tobacco: Never  Vaping Use   Vaping status: Never Used  Substance and Sexual  Activity   Alcohol use: No   Drug use: No   Sexual activity: Not on file  Other Topics Concern   Not on file  Social History Narrative   Not on file   Social Drivers of Health   Financial Resource Strain: Not on file  Food Insecurity: No Food Insecurity (01/22/2024)   Hunger Vital Sign    Worried About Running Out of Food in the Last Year: Never true    Ran Out of Food in the Last Year: Never true  Transportation Needs: No Transportation Needs (01/22/2024)   PRAPARE - Administrator, Civil Service (Medical): No    Lack of Transportation (Non-Medical): No  Physical Activity: Not on file  Stress: Not on file  Social Connections: Patient Declined (01/22/2024)   Social Connection and Isolation Panel    Frequency of Communication with Friends and Family: Patient declined    Frequency of Social Gatherings with Friends and Family: Patient declined    Attends Religious Services: Patient declined    Database administrator or Organizations: Patient declined    Attends Banker Meetings: Patient declined    Marital Status: Patient declined     Family History:  The patient's family history includes Cancer in his mother. There is no history of Diabetes Mellitus II.  ROS:   12-point review of systems is negative unless otherwise noted in the HPI.   EKGs/Other Studies Reviewed:    Studies reviewed were summarized above. The additional studies were reviewed today: 01/23/2024 Echo complete 1. Left ventricular ejection fraction, by estimation, is 70 to 75%. The  left ventricle has hyperdynamic function. The left ventricle has no  regional wall motion abnormalities. There is mild left ventricular  hypertrophy. Left ventricular diastolic  parameters are consistent with Grade I diastolic dysfunction (impaired  relaxation).   2. Right ventricular systolic function is normal. The right ventricular  size is normal.   3. The mitral valve is normal in structure. No evidence of  mitral valve  regurgitation. No evidence of mitral stenosis.   4. The aortic valve is bicuspid. Aortic valve regurgitation is not  visualized. No aortic stenosis is present.   5. The inferior vena cava is normal in size with greater than 50%  respiratory variability, suggesting right atrial pressure of 3 mmHg.   EKG:  EKG personally reviewed by me today EKG Interpretation Date/Time:  Friday February 07 2024 15:09:44 EDT Ventricular Rate:  59 PR Interval:  188 QRS Duration:  96 QT Interval:  414 QTC Calculation: 409 R Axis:   40  Text Interpretation: Sinus bradycardia Nonspecific ST and T wave abnormality When compared with ECG of 21-Jan-2024 14:14, No significant change was found Confirmed by Gildardo Labrador ([id number]) on 02/07/2024 3:16:07 PM  PHYSICAL EXAM:    VS:  BP 120/64 (BP Location: Right Arm, Patient Position: Sitting, Cuff Size: Normal)   Pulse (!) 53  Ht 5' 7 (1.702 m)   Wt 155 lb 9.6 oz (70.6 kg)   SpO2 93%   BMI 24.37 kg/m   BMI: Body mass index is 24.37 kg/m.  Physical Exam Vitals and nursing note reviewed.  Constitutional:      Appearance: Normal appearance.   Cardiovascular:     Rate and Rhythm: Normal rate and regular rhythm.     Heart sounds: No murmur heard.    No gallop.  Pulmonary:     Effort: Pulmonary effort is normal.     Breath sounds: Normal breath sounds. No wheezing or rales.   Musculoskeletal:     Right lower leg: No edema.     Left lower leg: No edema.   Skin:    General: Skin is warm and dry.   Neurological:     General: No focal deficit present.     Mental Status: He is alert and oriented to person, place, and time. Mental status is at baseline.   Psychiatric:        Mood and Affect: Mood normal.        Behavior: Behavior normal.     Wt Readings from Last 3 Encounters:  02/07/24 155 lb 9.6 oz (70.6 kg)  01/22/24 151 lb 7.3 oz (68.7 kg)  03/25/23 160 lb 4.8 oz (72.7 kg)     ASSESSMENT & PLAN:   Myocardial injury Precordial  pain - Recent hospitalization 6/3-6/5 for NSTEMI with troponin peaking at 100. Echo showed normal LV function without RWMA. Not felt to be a candidate for cardiac cath in the setting of dementia and CKD. DAPT was not recommended given fall risk. Patient reporting ongoing intermittent pain in the bilateral shoulders radiating into the chest and neck. Recommend further evaluation with Lexiscan  MPI. Continue ASA and statin.   Hypertension - BP well controlled without medication. Continue monitoring.   Snoring - Wife reports nighttime snoring and believes patient briefly stops breathing. Recommend referral for sleep study. Patient prefers to have this worked up at the [place].   Informed Consent   Shared Decision Making/Informed Consent The risks [chest pain, shortness of breath, cardiac arrhythmias, dizziness, blood pressure fluctuations, myocardial infarction, stroke/transient ischemic attack, nausea, vomiting, allergic reaction, radiation exposure, metallic taste sensation and life-threatening complications (estimated to be 1 in 10,000)], benefits (risk stratification, diagnosing coronary artery disease, treatment guidance) and alternatives of a nuclear stress test were discussed in detail with Mr. Kalmar and he agrees to proceed.     Disposition: F/u with Dr. Nolan Battle or an APP in 1 month/after testing.   Medication Adjustments/Labs and Tests Ordered: Current medicines are reviewed at length with the patient today.  Concerns regarding medicines are outlined above. Medication changes, Labs and Tests ordered today are summarized above and listed in the Patient Instructions accessible in Encounters.   Beather Liming, PA-C 02/07/2024 4:22 PM     [facility] [place] [address] [place], [place] [address] [phone]

## 2024-02-07 NOTE — Patient Instructions (Signed)
 Medication Instructions:  Your physician recommends that you continue on your current medications as directed. Please refer to the Current Medication list given to you today.   *If you need a refill on your cardiac medications before your next appointment, please call your pharmacy*  Lab Work: No labs ordered today  If you have labs (blood work) drawn today and your tests are completely normal, you will receive your results only by: MyChart Message (if you have MyChart) OR A paper copy in the mail If you have any lab test that is abnormal or we need to change your treatment, we will call you to review the results.  Testing/Procedures: Your provider has ordered a Lexiscan / Exercise Myoview  Stress test. This will take place at [facility]. Please report to the [facility] medical mall entrance. The volunteers at the first desk will direct you where to go.  [facility] MYOVIEW   Your provider has ordered a Stress Test with nuclear imaging. The purpose of this test is to evaluate the blood supply to your heart muscle. This procedure is referred to as a Non-Invasive Stress Test. This is because other than having an IV started in your vein, nothing is inserted or invades your body. Cardiac stress tests are done to find areas of poor blood flow to the heart by determining the extent of coronary artery disease (CAD). Some patients exercise on a treadmill, which naturally increases the blood flow to your heart, while others who are unable to walk on a treadmill due to physical limitations will have a pharmacologic/chemical stress agent called Lexiscan  . This medicine will mimic walking on a treadmill by temporarily increasing your coronary blood flow.   Please note: these test may take anywhere between 2-4 hours to complete  How to prepare for your Myoview  test:  Nothing to eat for 6 hours prior to the test No caffeine for 24 hours prior to test No smoking 24 hours prior to test. Your medication may be taken with  water.  If your doctor stopped a medication because of this test, do not take that medication. Ladies, please do not wear dresses.  Skirts or pants are appropriate. Please wear a short sleeve shirt. No perfume, cologne or lotion. Wear comfortable walking shoes. No heels!   PLEASE NOTIFY THE OFFICE AT LEAST 24 HOURS IN ADVANCE IF YOU ARE UNABLE TO KEEP YOUR APPOINTMENT.  [phone] AND  PLEASE NOTIFY NUCLEAR MEDICINE AT [facility] AT LEAST 24 HOURS IN ADVANCE IF YOU ARE UNABLE TO KEEP YOUR APPOINTMENT. [phone]   Follow-Up: At [facility], you and your health needs are our priority.  As part of our continuing mission to provide you with exceptional heart care, our providers are all part of one team.  This team includes your primary Cardiologist (physician) and Advanced Practice Providers or APPs (Physician Assistants and Nurse Practitioners) who all work together to provide you with the care you need, when you need it.  Your next appointment:   1 month(s)  Provider:   Sammy Crisp, MD or Gildardo Labrador, PA-C    Other Instructions Amb referral to [facility] for sleep study

## 2024-03-06 ENCOUNTER — Encounter
Admission: RE | Admit: 2024-03-06 | Discharge: 2024-03-06 | Disposition: A | Discharge status: Home / Self Care | Source: Ambulatory Visit | Attending: Physician Assistant | Admitting: Physician Assistant

## 2024-03-06 ENCOUNTER — Other Ambulatory Visit: Payer: Self-pay | Admitting: Physician Assistant

## 2024-03-06 DIAGNOSIS — R072 Precordial pain: Secondary | ICD-10-CM | POA: Diagnosis present

## 2024-03-06 LAB — NM MYOCAR MULTI W/SPECT W/WALL MOTION / EF
LV dias vol: 74 mL (ref 62–150)
LV sys vol: 24 mL (ref 4.2–5.8)
Nuc Stress EF: 68 %
Peak HR: 88 {beats}/min
Percent HR: 61 %
Rest HR: 57 {beats}/min
Rest Nuclear Isotope Dose: 9.7 mCi
SDS: 1
SRS: 0
SSS: 0
ST Depression (mm): 0 mm
Stress Nuclear Isotope Dose: 31.8 mCi
TID: 0.76

## 2024-03-06 MED ORDER — REGADENOSON 0.4 MG/5ML IV SOLN
0.4000 mg | Freq: Once | INTRAVENOUS | Status: AC
  Administered 2024-03-06: 0.4 mg via INTRAVENOUS
  Filled 2024-03-06: qty 5

## 2024-03-06 MED ORDER — TECHNETIUM TC 99M TETROFOSMIN IV KIT
9.6800 | PACK | Freq: Once | INTRAVENOUS | Status: AC | PRN
  Administered 2024-03-06: 9.68 via INTRAVENOUS

## 2024-03-06 MED ORDER — TECHNETIUM TC 99M TETROFOSMIN IV KIT
30.0000 | PACK | Freq: Once | INTRAVENOUS | Status: AC | PRN
  Administered 2024-03-06: 31.77 via INTRAVENOUS

## 2024-03-06 NOTE — Progress Notes (Unsigned)
     Sherida Billye Hales presented for a pharmacological nuclear stress test today.  I Bernardino Bring, PA-C, provided direct supervision and was present during the stress portion of the study today, which was completed without significant symptoms, immediate complications, or acute ST/T changes on ECG.  Stress imaging is pending at this time.  Preliminary ECG findings may be listed in the chart, but the stress test result will not be finalized until perfusion imaging is complete.  Bernardino Bring, PA-C  03/06/2024, 9:15 AM

## 2024-03-09 ENCOUNTER — Ambulatory Visit: Payer: Self-pay | Admitting: Physician Assistant

## 2024-03-18 NOTE — Progress Notes (Unsigned)
 Cardiology Office Note    Date:  03/19/2024   ID:  Carl, Figueroa 11-Apr-1946, MRN [id number]  PCP:  Epifanio Alm SQUIBB, MD  Cardiologist:  Lonni Hanson, MD  Electrophysiologist:  None   Chief Complaint: Follow up  History of Present Illness:   Carl Figueroa is a 78 y.o. male with history of underlying dementia, traumatic brain injury, chronic low back/leg pain, CKD IIIa, BPH, and hypertension who presents for follow up on precordial pain.    Patient was hospitalized 06/2019 due to altered mental status and an episode of hematuria.  He was found to have sepsis.  As a part of this workup, troponins were checked and noted to be elevated, with peak being 180.  Echo at that time showed normal EF without wall motion abnormality.  Troponin elevation was attributed to supply/demand mismatch in the setting of this acute infection.  He underwent Lexiscan  MPI 08/2019 which did not show significant ischemia, EF 58%.   Patient was seen in our office 04/2022 reporting lower extremity swelling and constant dizziness.  He completed ZIO monitoring 05/2022 which showed predominantly sinus rhythm with NSVT and SVT.  Carotid Doppler negative bilaterally.  He missed his follow-up appointment due to being admitted at the [facility] ICU per phone note from wife.  He was subsequently lost to follow-up.   Patient presented to the ED 6/3 with complaints of left arm/chest pain.  Troponin peaked at 101.  He was felt to be a poor candidate for intervention given CKD and dementia. Conservative treatment for NSTEMI was recommended.  He received IV heparin  for 24 hours and was subsequently started on aspirin  and statin. Long term DAPT was not recommended in the setting of fall risk. Echo showed EF 70 to 75% with no RWMA, G1 DD, and bicuspid aortic valve.    Patient was most recently seen by myself for hospital follow up. He reported ongoing symptoms of intermittent episodes of discomfort in his bilateral  shoulders radiating into his chest and neck. Lexiscan  myoview  was ordered and completed on 03/06/2024 which revealed no evidence of ischemia with normal EF, overall low risk study.  Patient presents to clinic with his wife today who assists in history given underlying dementia.  He is overall doing well from a cardiac perspective without symptoms of angina and cardiac decompensation.  Patient reports several weeks of lightheadedness/dizziness.  His wife reports that he has not been taking his medications for the past several weeks due to this.  He feels that his medications may be contributing.  His wife explains that he waited by a neurologist and they have explained that his lightheadedness/dizziness is due to his traumatic brain injury.  He denies chest pain, shortness of breath, lower extremity swelling, orthopnea, and PND.  Labs independently reviewed: 01/23/2024-NA 144, K4.1, BUN 15, creatinine 1.37 01/22/2024-TC 173, TG 40, HDL 55, LDL 110, Hgb 11.7, HCT 36.5, platelets 141  Objective   Past Medical History:  Diagnosis Date   Anxiety    Arthritis    Hypertension    Lewy body dementia (HCC)    TBI (traumatic brain injury) (HCC)     Current Medications: No outpatient medications have been marked as taking for the 03/19/24 encounter (Office Visit) with Carl Lesley CROME, PA-C.    Allergies:   Dust mite extract and Morphine   Social History   Socioeconomic History   Marital status: Married    Spouse name: Not on file   Number of children: Not on  file   Years of education: Not on file   Highest education level: Not on file  Occupational History   Not on file  Tobacco Use   Smoking status: Former    Current packs/day: 0.00    Average packs/day: 2.0 packs/day for 20.0 years (40.0 ttl pk-yrs)    Types: Cigarettes    Start date: 10/22/1959    Quit date: 10/22/1979    Years since quitting: 44.4   Smokeless tobacco: Never  Vaping Use   Vaping status: Never Used  Substance and Sexual  Activity   Alcohol use: No   Drug use: No   Sexual activity: Not on file  Other Topics Concern   Not on file  Social History Narrative   Not on file   Social Drivers of Health   Financial Resource Strain: Not on file  Food Insecurity: No Food Insecurity (01/22/2024)   Hunger Vital Sign    Worried About Running Out of Food in the Last Year: Never true    Ran Out of Food in the Last Year: Never true  Transportation Needs: No Transportation Needs (01/22/2024)   PRAPARE - Administrator, Civil Service (Medical): No    Lack of Transportation (Non-Medical): No  Physical Activity: Not on file  Stress: Not on file  Social Connections: Patient Declined (01/22/2024)   Social Connection and Isolation Panel    Frequency of Communication with Friends and Family: Patient declined    Frequency of Social Gatherings with Friends and Family: Patient declined    Attends Religious Services: Patient declined    Database administrator or Organizations: Patient declined    Attends Banker Meetings: Patient declined    Marital Status: Patient declined     Family History:  The patient's family history includes Cancer in his mother. There is no history of Diabetes Mellitus II.  ROS:   12-point review of systems is negative unless otherwise noted in the HPI.  EKGs/Other Studies Reviewed:    Studies reviewed were summarized above. The additional studies were reviewed today:  03/06/2024 Lexiscan  myoview    Probably normal pharmacologic myocardial perfusion stress test without evidence of significant ischemia.   Fixed inferior/inferoseptal defect with normal wall motion is most consistent with diaphragmatic attenuation and less likely scar.   Left ventricular systolic function is normal (LVEF greater than 65%).   Allowing for differences in technique, no significant change from prior study on 09/04/2019.   This is a low risk study  01/23/2024 Echo complete 1. Left ventricular  ejection fraction, by estimation, is 70 to 75%. The  left ventricle has hyperdynamic function. The left ventricle has no  regional wall motion abnormalities. There is mild left ventricular  hypertrophy. Left ventricular diastolic  parameters are consistent with Grade I diastolic dysfunction (impaired  relaxation).   2. Right ventricular systolic function is normal. The right ventricular  size is normal.   3. The mitral valve is normal in structure. No evidence of mitral valve  regurgitation. No evidence of mitral stenosis.   4. The aortic valve is bicuspid. Aortic valve regurgitation is not  visualized. No aortic stenosis is present.   5. The inferior vena cava is normal in size with greater than 50%  respiratory variability, suggesting right atrial pressure of 3 mmHg.   PHYSICAL EXAM:    VS:  BP 130/72 (BP Location: Left Arm, Patient Position: Sitting, Cuff Size: Normal)   Pulse 65   Ht 5' 6 (1.676 m)  Wt 154 lb 9.6 oz (70.1 kg)   SpO2 98%   BMI 24.95 kg/m   BMI: Body mass index is 24.95 kg/m.  Physical Exam Vitals and nursing note reviewed.  Constitutional:      Appearance: Normal appearance.  Cardiovascular:     Rate and Rhythm: Normal rate and regular rhythm.     Heart sounds: No murmur heard.    No gallop.  Pulmonary:     Effort: Pulmonary effort is normal.     Breath sounds: Normal breath sounds. No wheezing or rales.  Musculoskeletal:     Right lower leg: No edema.     Left lower leg: No edema.  Skin:    General: Skin is warm and dry.  Neurological:     General: No focal deficit present.     Mental Status: He is alert and oriented to person, place, and time. Mental status is at baseline.  Psychiatric:        Mood and Affect: Mood normal.        Behavior: Behavior normal.     Wt Readings from Last 3 Encounters:  03/19/24 154 lb 9.6 oz (70.1 kg)  02/07/24 155 lb 9.6 oz (70.6 kg)  01/22/24 151 lb 7.3 oz (68.7 kg)        ASSESSMENT & PLAN:    Myocardial injury Precordial pain - Recent hospitalization 6/3 - 6/5 for NSTEMI with troponin peaking at 100.  Echo showed normal LV function without RWMA.  Not felt to be candidate for cardiac cath in the setting of dementia and CKD.  DAPT was not recommended given fall risk.  Last appointment, patient reported ongoing symptoms of chest pain.  Lexiscan  Myoview  ordered and completed 03/06/2024 without evidence of ischemia.  Encouraged compliance with aspirin  and statin.  Hypertension - Blood pressure reasonably well-controlled without medication.  Continue to monitor.  Snoring - Patient's wife had reported snoring at last appointment.  He has been scheduled for sleep study to be completed in August.  Bicuspid aortic valve - No evidence of stenosis on most recent echo 01/2024.   Disposition: F/u with Dr. Mady or an APP in 3 months.   Medication Adjustments/Labs and Tests Ordered: Current medicines are reviewed at length with the patient today.  Concerns regarding medicines are outlined above. Medication changes, Labs and Tests ordered today are summarized above and listed in the Patient Instructions accessible in Encounters.   Carl Lesley Maffucci, PA-C 03/19/2024 4:02 PM     [facility] [place] [address] [place], [place] [address] [phone]

## 2024-03-19 ENCOUNTER — Encounter: Payer: Self-pay | Admitting: Physician Assistant

## 2024-03-19 ENCOUNTER — Ambulatory Visit: Attending: Physician Assistant | Admitting: Physician Assistant

## 2024-03-19 VITALS — BP 130/72 | HR 65 | Ht 66.0 in | Wt 154.6 lb

## 2024-03-19 DIAGNOSIS — I5A Non-ischemic myocardial injury (non-traumatic): Secondary | ICD-10-CM | POA: Insufficient documentation

## 2024-03-19 DIAGNOSIS — R0683 Snoring: Secondary | ICD-10-CM | POA: Diagnosis present

## 2024-03-19 DIAGNOSIS — Q2381 Bicuspid aortic valve: Secondary | ICD-10-CM | POA: Insufficient documentation

## 2024-03-19 DIAGNOSIS — R072 Precordial pain: Secondary | ICD-10-CM | POA: Diagnosis present

## 2024-03-19 DIAGNOSIS — I10 Essential (primary) hypertension: Secondary | ICD-10-CM | POA: Insufficient documentation

## 2024-03-19 NOTE — Patient Instructions (Signed)
 Medication Instructions:  Your physician recommends that you continue on your current medications as directed. Please refer to the Current Medication list given to you today.   *If you need a refill on your cardiac medications before your next appointment, please call your pharmacy*  Lab Work: None ordered at this time  If you have labs (blood work) drawn today and your tests are completely normal, you will receive your results only by: MyChart Message (if you have MyChart) OR A paper copy in the mail If you have any lab test that is abnormal or we need to change your treatment, we will call you to review the results.  Follow-Up: At [facility], you and your health needs are our priority.  As part of our continuing mission to provide you with exceptional heart care, our providers are all part of one team.  This team includes your primary Cardiologist (physician) and Advanced Practice Providers or APPs (Physician Assistants and Nurse Practitioners) who all work together to provide you with the care you need, when you need it.  Your next appointment:   3 month(s)  Provider:   You may see Lonni Hanson, MD or one of the following Advanced Practice Providers on your designated Care Team:   Lesley Maffucci, PA-C Bernardino Bring, PA-C  We recommend signing up for the patient portal called MyChart.  Sign up information is provided on this After Visit Summary.  MyChart is used to connect with patients for Virtual Visits (Telemedicine).  Patients are able to view lab/test results, encounter notes, upcoming appointments, etc.  Non-urgent messages can be sent to your provider as well.   To learn more about what you can do with MyChart, go to [email or web address].

## 2024-03-24 ENCOUNTER — Ambulatory Visit: Payer: Medicare Other | Admitting: Nurse Practitioner

## 2024-03-24 ENCOUNTER — Inpatient Hospital Stay: Payer: Medicare Other | Attending: Oncology

## 2024-03-24 DIAGNOSIS — F0280 Dementia in other diseases classified elsewhere without behavioral disturbance: Secondary | ICD-10-CM | POA: Insufficient documentation

## 2024-03-24 DIAGNOSIS — Z9049 Acquired absence of other specified parts of digestive tract: Secondary | ICD-10-CM | POA: Insufficient documentation

## 2024-03-24 DIAGNOSIS — Z87891 Personal history of nicotine dependence: Secondary | ICD-10-CM | POA: Insufficient documentation

## 2024-03-24 DIAGNOSIS — Z809 Family history of malignant neoplasm, unspecified: Secondary | ICD-10-CM | POA: Insufficient documentation

## 2024-03-24 DIAGNOSIS — Z933 Colostomy status: Secondary | ICD-10-CM | POA: Insufficient documentation

## 2024-03-24 DIAGNOSIS — Z87440 Personal history of urinary (tract) infections: Secondary | ICD-10-CM | POA: Insufficient documentation

## 2024-03-24 DIAGNOSIS — G3183 Dementia with Lewy bodies: Secondary | ICD-10-CM | POA: Insufficient documentation

## 2024-03-24 DIAGNOSIS — D509 Iron deficiency anemia, unspecified: Secondary | ICD-10-CM | POA: Insufficient documentation

## 2024-03-27 ENCOUNTER — Encounter (HOSPITAL_COMMUNITY): Payer: Self-pay

## 2024-03-27 ENCOUNTER — Emergency Department (HOSPITAL_COMMUNITY): Admission: EM | Admit: 2024-03-27 | Discharge: 2024-03-27 | Disposition: A | Discharge status: Home / Self Care

## 2024-03-27 ENCOUNTER — Emergency Department (HOSPITAL_COMMUNITY)

## 2024-03-27 ENCOUNTER — Other Ambulatory Visit: Payer: Self-pay

## 2024-03-27 DIAGNOSIS — F0390 Unspecified dementia without behavioral disturbance: Secondary | ICD-10-CM | POA: Diagnosis not present

## 2024-03-27 DIAGNOSIS — R079 Chest pain, unspecified: Secondary | ICD-10-CM | POA: Diagnosis present

## 2024-03-27 DIAGNOSIS — Z79899 Other long term (current) drug therapy: Secondary | ICD-10-CM | POA: Diagnosis not present

## 2024-03-27 DIAGNOSIS — I10 Essential (primary) hypertension: Secondary | ICD-10-CM | POA: Insufficient documentation

## 2024-03-27 DIAGNOSIS — Z7982 Long term (current) use of aspirin: Secondary | ICD-10-CM | POA: Insufficient documentation

## 2024-03-27 LAB — CBC
HCT: 37.4 % — ABNORMAL LOW (ref 39.0–52.0)
Hemoglobin: 11.8 g/dL — ABNORMAL LOW (ref 13.0–17.0)
MCH: 27.7 pg (ref 26.0–34.0)
MCHC: 31.6 g/dL (ref 30.0–36.0)
MCV: 87.8 fL (ref 80.0–100.0)
Platelets: 165 K/uL (ref 150–400)
RBC: 4.26 MIL/uL (ref 4.22–5.81)
RDW: 13.2 % (ref 11.5–15.5)
WBC: 5.1 K/uL (ref 4.0–10.5)
nRBC: 0 % (ref 0.0–0.2)

## 2024-03-27 LAB — BASIC METABOLIC PANEL WITH GFR
Anion gap: 8 (ref 5–15)
BUN: 14 mg/dL (ref 8–23)
CO2: 25 mmol/L (ref 22–32)
Calcium: 9.4 mg/dL (ref 8.9–10.3)
Chloride: 114 mmol/L — ABNORMAL HIGH (ref 98–111)
Creatinine, Ser: 1.73 mg/dL — ABNORMAL HIGH (ref 0.61–1.24)
GFR, Estimated: 40 mL/min — ABNORMAL LOW (ref >60)
Glucose, Bld: 80 mg/dL (ref 70–99)
Potassium: 4 mmol/L (ref 3.5–5.1)
Sodium: 147 mmol/L — ABNORMAL HIGH (ref 135–145)

## 2024-03-27 LAB — BRAIN NATRIURETIC PEPTIDE: B Natriuretic Peptide: 55 pg/mL (ref 0.0–100.0)

## 2024-03-27 LAB — TROPONIN I (HIGH SENSITIVITY)
Troponin I (High Sensitivity): 35 ng/L — ABNORMAL HIGH (ref <18)
Troponin I (High Sensitivity): 33 ng/L — ABNORMAL HIGH (ref <18)

## 2024-03-27 MED ORDER — FENTANYL CITRATE PF 50 MCG/ML IJ SOSY
25.0000 ug | PREFILLED_SYRINGE | Freq: Once | INTRAMUSCULAR | Status: AC
  Administered 2024-03-27: 25 ug via INTRAVENOUS
  Filled 2024-03-27: qty 1

## 2024-03-27 NOTE — ED Provider Notes (Signed)
 [place] EMERGENCY DEPARTMENT AT [facility] Provider Note   CSN: [id number] Arrival date & time: 03/27/24  1030     Patient presents with: Chest Pain   Carl Figueroa is a 78 y.o. male.   78 year old male with past medical history of dementia and hypertension presenting to the emergency department today with chest pain.  The patient states that he woke up this morning with dull chest pain in the anterior chest.  Does not radiate.  The patient received aspirin  and nitroglycerin with medics.  He states that he thinks that the symptoms got a little worse after the nitroglycerin.  He denies any shortness of breath with this.  He does report some mild lower extremity swelling compared to normal in both lower extremities.  Denies any hemoptysis.  He was sent into the ER today for further evaluation regarding this.  He reports that the pain currently is moderate in intensity.   Chest Pain      Prior to Admission medications   Medication Sig Start Date End Date Taking? Authorizing Provider  aspirin  EC 81 MG tablet Take 1 tablet (81 mg total) by mouth daily. Swallow whole. 01/23/24   Kandis Perkins, DO  escitalopram  (LEXAPRO ) 5 MG tablet Take 5 mg by mouth daily. Patient not taking: Reported on 02/07/2024 01/23/23   [provider]  Melatonin 5 MG CAPS Take 2 capsules by mouth at bedtime.    [provider]  rivastigmine  (EXELON ) 4.6 mg/24hr Place 4.6 mg onto the skin daily. 12/06/22 02/07/24  [provider]  rosuvastatin  (CRESTOR ) 20 MG tablet Take 1 tablet (20 mg total) by mouth daily. 01/23/24   Kandis Perkins, DO    Allergies: Dust mite extract and Morphine    Review of Systems  Cardiovascular:  Positive for chest pain.  All other systems reviewed and are negative.   Updated Vital Signs BP 117/84   Pulse 63   Resp 15   SpO2 100%   Physical Exam Vitals and nursing note reviewed.   Gen: NAD Eyes: PERRL, EOMI HEENT: no oropharyngeal  swelling Neck: trachea midline Resp: clear to auscultation bilaterally Card: RRR, no murmurs, rubs, or gallops Abd: nontender, nondistended Extremities: no calf tenderness, no edema, trace pitting edema bilaterally Vascular: 2+ radial pulses bilaterally, 2+ DP pulses bilaterally Neuro: No focal deficits Skin: no rashes Psyc: acting appropriately   (all labs ordered are listed, but only abnormal results are displayed) Labs Reviewed  BASIC METABOLIC PANEL WITH GFR - Abnormal; Notable for the following components:      Result Value   Sodium 147 (*)    Chloride 114 (*)    Creatinine, Ser 1.73 (*)    GFR, Estimated 40 (*)    All other components within normal limits  CBC - Abnormal; Notable for the following components:   Hemoglobin 11.8 (*)    HCT 37.4 (*)    All other components within normal limits  TROPONIN I (HIGH SENSITIVITY) - Abnormal; Notable for the following components:   Troponin I (High Sensitivity) 35 (*)    All other components within normal limits  TROPONIN I (HIGH SENSITIVITY) - Abnormal; Notable for the following components:   Troponin I (High Sensitivity) 33 (*)    All other components within normal limits  BRAIN NATRIURETIC PEPTIDE    EKG: EKG Interpretation Date/Time:  Friday March 27 2024 10:39:40 EDT Ventricular Rate:  73 PR Interval:  51 QRS Duration:  96 QT Interval:  373 QTC Calculation: 411 R  Axis:   78  Text Interpretation: Sinus rhythm Short PR interval Nonspecific T abnormalities, lateral leads Artifact in lead(s) I III aVR aVL V1 V2 Confirmed by Ula Barter [phone]) on 03/27/2024 1:48:05 PM  Radiology: [facility] Chest Port 1 View Result Date: 03/27/2024 CLINICAL DATA:  Chest pain. EXAM: PORTABLE CHEST 1 VIEW COMPARISON:  01/21/2024. FINDINGS: The heart size and mediastinal contours are unchanged. Similar asymmetric elevation of the left hemidiaphragm. No focal consolidation, pleural effusion, or pneumothorax. No acute osseous abnormality. IMPRESSION: No  acute cardiopulmonary findings. Electronically Signed   By: Harrietta Sherry M.D.   On: 03/27/2024 11:26     Procedures   Medications Ordered in the ED  fentaNYL  (SUBLIMAZE ) injection 25 mcg (25 mcg Intravenous Given 03/27/24 1100)                                    Medical Decision Making 78 year old male with past medical history of hypertension and dementia presenting to the emergency department today with chest pain.  I will further evaluate the patient here with basic lab as well as an EKG, chest x-ray, troponin for further evaluation for ACS, pulmonary edema, pulmonary infiltrates, or pneumothorax.  Will give the patient low-dose fentanyl  here for pain.  I will keep patient on cardiac monitor to evaluate for arrhythmias.  Will reevaluate for ultimate disposition.  Based on description of his symptoms and reassuring neurovascular exam with stable vital signs suspicion for pulmonary embolism or aortic dissection is low at this time.  The patient's work appears reassuring.  Chest x-ray is unremarkable.  EKG does appear similar in morphology to the patient's previous EKG from June 3.  Looking back through the notes it appears that even when the patient was having an NSTEMI that they were recommending medical management due to his comorbidities.  With his troponins being flat and downtrending do not think that he would benefit from hospitalization given his dementia.  I think he is stable for discharge.  Is encouraged to follow-up with his doctor for reevaluation.  Amount and/or Complexity of Data Reviewed Labs: ordered. Radiology: ordered.  Risk Prescription drug management.        Final diagnoses:  Chest pain, unspecified type    ED Discharge Orders     None          Ula Barter SAUNDERS, MD 03/27/24 1401

## 2024-03-27 NOTE — ED Notes (Signed)
 ED Provider at bedside.

## 2024-03-27 NOTE — ED Notes (Signed)
 Wife at bedside to take pt home.

## 2024-03-27 NOTE — ED Triage Notes (Signed)
 [facility] reports pt coming from [facility] for c/o chest pain. Pt states around 0600 chest pain began. Pt showing Afib on monitor. Pt took 3 ASA prior to EMS arrival. Pt states center of the chest non radiating.

## 2024-03-27 NOTE — Discharge Instructions (Signed)
 Your workup today was reassuring.  Please follow-up with your doctor and return to the ER for worsening symptoms.

## 2024-04-01 ENCOUNTER — Other Ambulatory Visit: Payer: Self-pay | Admitting: Infectious Diseases

## 2024-04-01 DIAGNOSIS — I10 Essential (primary) hypertension: Secondary | ICD-10-CM

## 2024-04-01 DIAGNOSIS — R42 Dizziness and giddiness: Secondary | ICD-10-CM

## 2024-04-01 DIAGNOSIS — I214 Non-ST elevation (NSTEMI) myocardial infarction: Secondary | ICD-10-CM

## 2024-04-09 ENCOUNTER — Inpatient Hospital Stay

## 2024-04-09 ENCOUNTER — Encounter: Payer: Self-pay | Admitting: Nurse Practitioner

## 2024-04-09 ENCOUNTER — Inpatient Hospital Stay (HOSPITAL_BASED_OUTPATIENT_CLINIC_OR_DEPARTMENT_OTHER): Admitting: Nurse Practitioner

## 2024-04-09 VITALS — BP 147/55 | HR 70 | Temp 98.0°F | Resp 20 | Wt 154.4 lb

## 2024-04-09 DIAGNOSIS — D638 Anemia in other chronic diseases classified elsewhere: Secondary | ICD-10-CM

## 2024-04-09 DIAGNOSIS — G3183 Dementia with Lewy bodies: Secondary | ICD-10-CM | POA: Diagnosis not present

## 2024-04-09 DIAGNOSIS — Z809 Family history of malignant neoplasm, unspecified: Secondary | ICD-10-CM | POA: Diagnosis not present

## 2024-04-09 DIAGNOSIS — Z9049 Acquired absence of other specified parts of digestive tract: Secondary | ICD-10-CM | POA: Diagnosis not present

## 2024-04-09 DIAGNOSIS — Z87440 Personal history of urinary (tract) infections: Secondary | ICD-10-CM | POA: Diagnosis not present

## 2024-04-09 DIAGNOSIS — Z933 Colostomy status: Secondary | ICD-10-CM | POA: Diagnosis not present

## 2024-04-09 DIAGNOSIS — D509 Iron deficiency anemia, unspecified: Secondary | ICD-10-CM | POA: Diagnosis present

## 2024-04-09 DIAGNOSIS — Z87891 Personal history of nicotine dependence: Secondary | ICD-10-CM | POA: Diagnosis not present

## 2024-04-09 DIAGNOSIS — F0280 Dementia in other diseases classified elsewhere without behavioral disturbance: Secondary | ICD-10-CM | POA: Diagnosis not present

## 2024-04-09 LAB — CBC WITH DIFFERENTIAL/PLATELET
Abs Immature Granulocytes: 0.02 K/uL (ref 0.00–0.07)
Basophils Absolute: 0 K/uL (ref 0.0–0.1)
Basophils Relative: 0 %
Eosinophils Absolute: 0.3 K/uL (ref 0.0–0.5)
Eosinophils Relative: 5 %
HCT: 36.9 % — ABNORMAL LOW (ref 39.0–52.0)
Hemoglobin: 11.9 g/dL — ABNORMAL LOW (ref 13.0–17.0)
Immature Granulocytes: 0 %
Lymphocytes Relative: 23 %
Lymphs Abs: 1.3 K/uL (ref 0.7–4.0)
MCH: 27.9 pg (ref 26.0–34.0)
MCHC: 32.2 g/dL (ref 30.0–36.0)
MCV: 86.6 fL (ref 80.0–100.0)
Monocytes Absolute: 0.4 K/uL (ref 0.1–1.0)
Monocytes Relative: 8 %
Neutro Abs: 3.4 K/uL (ref 1.7–7.7)
Neutrophils Relative %: 64 %
Platelets: 157 K/uL (ref 150–400)
RBC: 4.26 MIL/uL (ref 4.22–5.81)
RDW: 13.1 % (ref 11.5–15.5)
WBC: 5.4 K/uL (ref 4.0–10.5)
nRBC: 0 % (ref 0.0–0.2)

## 2024-04-09 LAB — IRON AND TIBC
Iron: 65 ug/dL (ref 45–182)
Saturation Ratios: 25 % (ref 17.9–39.5)
TIBC: 263 ug/dL (ref 250–450)
UIBC: 198 ug/dL

## 2024-04-09 LAB — FERRITIN: Ferritin: 52 ng/mL (ref 24–336)

## 2024-04-09 NOTE — Progress Notes (Signed)
 Hematology/Oncology Consult Note [facility]  [phone] [phone]  Patient Care Team: Epifanio Alm SQUIBB, MD as PCP - General (Infectious Diseases) End, Lonni, MD as PCP - Cardiology (Cardiology) Melanee Annah BROCKS, MD as Consulting Physician (Oncology)   Name of the patient: Carl Figueroa  [id number]  12-18-1945   Date of visit: 04/09/24  Diagnosis- anemia of chronic disease with a component of iron deficiency  Chief complaint/ Reason for visit-routine follow-up of anemia  Heme/Onc history: patient is a 78 year old male who underwent total colectomy end ileostomy at the [facility] in October 2023.  He has a history of Lewy body dementia.  He has been having back-to-back hospitalizations since his colectomy for altered mental status as well as intra-abdominal abscesses and UTIs.  He was discharged from the hospital 3 weeks ago.  During his hospitalization his hemoglobin had dropped down to 7.6 needing blood transfusion.  At baseline his hemoglobin runs around 11-12.  Ferritin and iron studies B12 and folate were checked during hospitalization and were normal and consistent with anemia of chronic disease.  Workup for multiple myeloma negative.  No evidence of hemolysis.    Interval history- Carl Figueroa is a 78 y.o. male with above hsitory of anemia and dementia, who returns to clinic for follow up. In interim, he was seen in [facility] ER for chest pain. Hmg was 11.8. He was discharged to home. Taking oral iron once a day. Tolerating well. Denies any neurologic complaints. Denies recent fevers or illnesses. Denies any easy bleeding or bruising. No melena or hematochezia. No pica or restless leg. Reports good appetite and denies weight loss. Denies chest pain. Denies any nausea, vomiting, constipation, or diarrhea. Denies urinary complaints. Patient offers no further specific complaints today.  ECOG PS- 2 (due to comorbidities) Pain scale-  0  Review of systems- Review of Systems  Constitutional:  Negative for chills, fever, malaise/fatigue and weight loss.  HENT:  Negative for congestion, ear discharge and nosebleeds.   Eyes:  Negative for blurred vision.  Respiratory:  Negative for cough, hemoptysis, sputum production, shortness of breath and wheezing.   Cardiovascular:  Negative for chest pain, palpitations, orthopnea and claudication.  Gastrointestinal:  Negative for abdominal pain, blood in stool, constipation, diarrhea, heartburn, melena, nausea and vomiting.  Genitourinary:  Negative for dysuria, flank pain, frequency, hematuria and urgency.  Musculoskeletal:  Negative for back pain, joint pain and myalgias.  Skin:  Negative for rash.  Neurological:  Negative for dizziness, tingling, focal weakness, seizures, weakness and headaches.  Endo/Heme/Allergies:  Does not bruise/bleed easily.  Psychiatric/Behavioral:  Negative for depression and suicidal ideas. The patient does not have insomnia.      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   Procedure: TOTAL HIP ARTHROPLASTY ANTERIOR APPROACH;  Surgeon: Kathlynn Sharper, MD;  Location: [facility] ORS;  Service: Orthopedics;  Laterality: Left;    Social History   Socioeconomic History   Marital status: Married    Spouse name: Not on file   Number of children: Not on file   Years of education: Not on file   Highest education level: Not on file  Occupational History    Not on file  Tobacco Use   Smoking status: Former    Current packs/day: 0.00    Average packs/day: 2.0 packs/day for 20.0 years (40.0 ttl pk-yrs)    Types: Cigarettes    Start date: 10/22/1959    Quit date: 10/22/1979    Years since quitting: 44.4   Smokeless tobacco: Never  Vaping Use   Vaping status: Never Used  Substance and Sexual Activity   Alcohol use: No   Drug use: No   Sexual activity: Not on file  Other Topics Concern   Not on file  Social History Narrative   Not on file   Social Drivers of Health   Financial Resource Strain: Low Risk  (03/30/2024)   Received from [facility]   Overall Financial Resource Strain (CARDIA)    Difficulty of Paying Living Expenses: Not hard at all  Food Insecurity: No Food Insecurity (03/30/2024)   Received from [facility]   Hunger Vital Sign    Within the past 12 months, you worried that your food would run out before you got the money to buy more.: Never true    Within the past 12 months, the food you bought just didn't last and you didn't have money to get more.: Never true  Transportation Needs: No Transportation Needs (03/30/2024)   Received from [facility] - Transportation    In the past 12 months, has lack of transportation kept you from medical appointments or from getting medications?: No    Lack of Transportation (Non-Medical): No  Physical Activity: Not on file  Stress: Not on file  Social Connections: Patient Declined (01/22/2024)   Social Connection and Isolation Panel    Frequency of Communication with Friends and Family: Patient declined    Frequency of Social Gatherings with Friends and Family: Patient declined    Attends Religious Services: Patient declined    Database administrator or Organizations: Patient declined    Attends Banker Meetings: Patient declined    Marital Status: Patient declined  Intimate Partner Violence: Not At Risk  (01/22/2024)   Humiliation, Afraid, Rape, and Kick questionnaire    Fear of Current or Ex-Partner: No    Emotionally Abused: No    Physically Abused: No    Sexually Abused: No    Family History  Problem Relation Age of Onset   Cancer Mother    Diabetes Mellitus II Neg Hx     Current Outpatient Medications:    aspirin  EC 81 MG tablet, Take 1 tablet (81 mg total) by mouth daily. Swallow whole., Disp: 30 tablet, Rfl: 0   escitalopram  (LEXAPRO ) 5 MG tablet, Take 5 mg by mouth daily. (Patient not taking: Reported on 02/07/2024), Disp: , Rfl:    Melatonin 5 MG CAPS, Take 2 capsules by mouth at bedtime., Disp: , Rfl:    rivastigmine  (EXELON ) 4.6 mg/24hr, Place 4.6 mg onto the skin daily., Disp: , Rfl:    rosuvastatin  (CRESTOR ) 20 MG tablet, Take 1 tablet (20 mg total) by mouth daily., Disp: 30 tablet, Rfl: 0  Physical exam:  There were no  vitals filed for this visit.  Physical Exam Vitals reviewed.  Constitutional:      Appearance: He is not ill-appearing.     Comments: Accompanied by step son  Pulmonary:     Effort: Pulmonary effort is normal.  Abdominal:     General: There is no distension.     Comments: colostomy  Musculoskeletal:     Comments: wheelchair  Skin:    General: Skin is warm and dry.     Coloration: Skin is not pale.  Neurological:     Mental Status: He is alert. Mental status is at baseline.  Psychiatric:        Mood and Affect: Mood normal.        Behavior: Behavior normal.        Latest Ref Rng & Units 03/27/2024   10:51 AM  CMP  Glucose 70 - 99 mg/dL 80   BUN 8 - 23 mg/dL 14   Creatinine 9.38 - 1.24 mg/dL 8.26   Sodium 864 - 854 mmol/L 147   Potassium 3.5 - 5.1 mmol/L 4.0   Chloride 98 - 111 mmol/L 114   CO2 22 - 32 mmol/L 25   Calcium  8.9 - 10.3 mg/dL 9.4       Latest Ref Rng & Units 04/09/2024    1:18 PM  CBC  WBC 4.0 - 10.5 K/uL 5.4   Hemoglobin 13.0 - 17.0 g/dL 88.0   Hematocrit 60.9 - 52.0 % 36.9   Platelets 150 - 400 K/uL 157     Iron/TIBC/Ferritin/ %Sat    Component Value Date/Time   IRON 80 09/25/2023 1115   TIBC 288 09/25/2023 1115   FERRITIN 58 09/25/2023 1115   IRONPCTSAT 28 09/25/2023 1115    Assessment and Plan- Patient is a 78 y.o. male who returns to clinic for follow up of:   Anemia of chronic disease- Hmg improved over past 6 months from 7.5 to 11.7. Ferritin was mildly low at 36 but iron studies were normal. He was started on oral iron every other day. Hmg is stable. Hold off on iron infusions. Continue oral iron. Tolerating well.  Etiology of iron deficiency- hx of colectomy and colostomy. He saw Dr Epifanio who [facility]. OK to hold additional workup given stability of counts.  Abnormal kappa free light chains- previously noted to have mildly elevated kappa light chain with ratio of 3. Kappa free light chain 09/2023 was 26.7 (decreased) and ratio 2.1. Monitoring. Labs pending today.   Lewy Body Dementia- follow by neuro at [facility] and locally.   Disposition:  6 mo- lab (cbc, cmp, ferritin, iron studies, SPEP, k/l LC)  12 mo- lab (cbc, cmp, ferritin, iron studies, SPEP, k/l lc) , Dr Melanee   Visit Diagnosis 1. Anemia of chronic disease   2. Iron deficiency anemia, unspecified iron deficiency anemia type    Tinnie Dawn, DNP, AGNP-C, AOCNP [facility] [phone] (clinic) 04/09/2024

## 2024-04-10 LAB — KAPPA/LAMBDA LIGHT CHAINS
Kappa free light chain: 24.9 mg/L — ABNORMAL HIGH (ref 3.3–19.4)
Kappa, lambda light chain ratio: 1.87 — ABNORMAL HIGH (ref 0.26–1.65)
Lambda free light chains: 13.3 mg/L (ref 5.7–26.3)

## 2024-04-12 LAB — MULTIPLE MYELOMA PANEL, SERUM
Albumin SerPl Elph-Mcnc: 3.3 g/dL (ref 2.9–4.4)
Albumin/Glob SerPl: 1.3 (ref 0.7–1.7)
Alpha 1: 0.2 g/dL (ref 0.0–0.4)
Alpha2 Glob SerPl Elph-Mcnc: 0.6 g/dL (ref 0.4–1.0)
B-Globulin SerPl Elph-Mcnc: 0.9 g/dL (ref 0.7–1.3)
Gamma Glob SerPl Elph-Mcnc: 1 g/dL (ref 0.4–1.8)
Globulin, Total: 2.7 g/dL (ref 2.2–3.9)
IgA: 192 mg/dL (ref 61–437)
IgG (Immunoglobin G), Serum: 1011 mg/dL (ref 603–1613)
IgM (Immunoglobulin M), Srm: 44 mg/dL (ref 15–143)
Total Protein ELP: 6 g/dL (ref 6.0–8.5)

## 2024-04-15 ENCOUNTER — Ambulatory Visit: Admission: RE | Admit: 2024-04-15 | Source: Ambulatory Visit

## 2024-05-14 ENCOUNTER — Ambulatory Visit
Admission: RE | Admit: 2024-05-14 | Source: Ambulatory Visit | Attending: Infectious Diseases | Admitting: Infectious Diseases

## 2024-05-29 ENCOUNTER — Inpatient Hospital Stay (HOSPITAL_COMMUNITY)
Admission: EM | Admit: 2024-05-29 | Discharge: 2024-05-30 | DRG: 313 | Disposition: A | Discharge status: Home / Self Care | Attending: Internal Medicine | Admitting: Internal Medicine

## 2024-05-29 ENCOUNTER — Other Ambulatory Visit: Payer: Self-pay

## 2024-05-29 ENCOUNTER — Encounter (HOSPITAL_COMMUNITY): Payer: Self-pay

## 2024-05-29 ENCOUNTER — Emergency Department (HOSPITAL_COMMUNITY)

## 2024-05-29 DIAGNOSIS — R7989 Other specified abnormal findings of blood chemistry: Secondary | ICD-10-CM | POA: Diagnosis present

## 2024-05-29 DIAGNOSIS — Z87438 Personal history of other diseases of male genital organs: Secondary | ICD-10-CM | POA: Diagnosis not present

## 2024-05-29 DIAGNOSIS — G3183 Dementia with Lewy bodies: Secondary | ICD-10-CM | POA: Diagnosis present

## 2024-05-29 DIAGNOSIS — Z9109 Other allergy status, other than to drugs and biological substances: Secondary | ICD-10-CM

## 2024-05-29 DIAGNOSIS — Q2381 Bicuspid aortic valve: Secondary | ICD-10-CM

## 2024-05-29 DIAGNOSIS — I10 Essential (primary) hypertension: Secondary | ICD-10-CM | POA: Diagnosis not present

## 2024-05-29 DIAGNOSIS — Z885 Allergy status to narcotic agent status: Secondary | ICD-10-CM

## 2024-05-29 DIAGNOSIS — Z87891 Personal history of nicotine dependence: Secondary | ICD-10-CM | POA: Diagnosis not present

## 2024-05-29 DIAGNOSIS — I252 Old myocardial infarction: Secondary | ICD-10-CM | POA: Diagnosis not present

## 2024-05-29 DIAGNOSIS — N1832 Chronic kidney disease, stage 3b: Secondary | ICD-10-CM | POA: Diagnosis present

## 2024-05-29 DIAGNOSIS — Z9049 Acquired absence of other specified parts of digestive tract: Secondary | ICD-10-CM | POA: Diagnosis not present

## 2024-05-29 DIAGNOSIS — F0280 Dementia in other diseases classified elsewhere without behavioral disturbance: Secondary | ICD-10-CM | POA: Diagnosis present

## 2024-05-29 DIAGNOSIS — Z87820 Personal history of traumatic brain injury: Secondary | ICD-10-CM | POA: Diagnosis not present

## 2024-05-29 DIAGNOSIS — I5032 Chronic diastolic (congestive) heart failure: Secondary | ICD-10-CM | POA: Diagnosis present

## 2024-05-29 DIAGNOSIS — Z8659 Personal history of other mental and behavioral disorders: Secondary | ICD-10-CM | POA: Insufficient documentation

## 2024-05-29 DIAGNOSIS — Z96642 Presence of left artificial hip joint: Secondary | ICD-10-CM | POA: Diagnosis present

## 2024-05-29 DIAGNOSIS — F0284 Dementia in other diseases classified elsewhere, unspecified severity, with anxiety: Secondary | ICD-10-CM | POA: Diagnosis present

## 2024-05-29 DIAGNOSIS — I2510 Atherosclerotic heart disease of native coronary artery without angina pectoris: Secondary | ICD-10-CM | POA: Diagnosis present

## 2024-05-29 DIAGNOSIS — Z7982 Long term (current) use of aspirin: Secondary | ICD-10-CM

## 2024-05-29 DIAGNOSIS — I130 Hypertensive heart and chronic kidney disease with heart failure and stage 1 through stage 4 chronic kidney disease, or unspecified chronic kidney disease: Secondary | ICD-10-CM | POA: Diagnosis present

## 2024-05-29 DIAGNOSIS — Z933 Colostomy status: Secondary | ICD-10-CM

## 2024-05-29 DIAGNOSIS — R0789 Other chest pain: Principal | ICD-10-CM | POA: Diagnosis present

## 2024-05-29 DIAGNOSIS — E1322 Other specified diabetes mellitus with diabetic chronic kidney disease: Secondary | ICD-10-CM

## 2024-05-29 DIAGNOSIS — Z79899 Other long term (current) drug therapy: Secondary | ICD-10-CM

## 2024-05-29 DIAGNOSIS — R079 Chest pain, unspecified: Principal | ICD-10-CM

## 2024-05-29 DIAGNOSIS — F411 Generalized anxiety disorder: Secondary | ICD-10-CM | POA: Diagnosis present

## 2024-05-29 DIAGNOSIS — G20A1 Parkinson's disease without dyskinesia, without mention of fluctuations: Secondary | ICD-10-CM | POA: Diagnosis present

## 2024-05-29 DIAGNOSIS — E785 Hyperlipidemia, unspecified: Secondary | ICD-10-CM | POA: Diagnosis present

## 2024-05-29 DIAGNOSIS — N400 Enlarged prostate without lower urinary tract symptoms: Secondary | ICD-10-CM | POA: Diagnosis present

## 2024-05-29 DIAGNOSIS — R001 Bradycardia, unspecified: Secondary | ICD-10-CM | POA: Diagnosis present

## 2024-05-29 DIAGNOSIS — I214 Non-ST elevation (NSTEMI) myocardial infarction: Secondary | ICD-10-CM | POA: Diagnosis present

## 2024-05-29 DIAGNOSIS — N183 Chronic kidney disease, stage 3 unspecified: Secondary | ICD-10-CM | POA: Diagnosis present

## 2024-05-29 LAB — CBC
HCT: 38 % — ABNORMAL LOW (ref 39.0–52.0)
Hemoglobin: 12.2 g/dL — ABNORMAL LOW (ref 13.0–17.0)
MCH: 28 pg (ref 26.0–34.0)
MCHC: 32.1 g/dL (ref 30.0–36.0)
MCV: 87.2 fL (ref 80.0–100.0)
Platelets: 155 K/uL (ref 150–400)
RBC: 4.36 MIL/uL (ref 4.22–5.81)
RDW: 13.2 % (ref 11.5–15.5)
WBC: 5.1 K/uL (ref 4.0–10.5)
nRBC: 0 % (ref 0.0–0.2)

## 2024-05-29 LAB — BASIC METABOLIC PANEL WITH GFR
Anion gap: 6 (ref 5–15)
BUN: 13 mg/dL (ref 8–23)
CO2: 25 mmol/L (ref 22–32)
Calcium: 9.2 mg/dL (ref 8.9–10.3)
Chloride: 113 mmol/L — ABNORMAL HIGH (ref 98–111)
Creatinine, Ser: 1.51 mg/dL — ABNORMAL HIGH (ref 0.61–1.24)
GFR, Estimated: 47 mL/min — ABNORMAL LOW (ref >60)
Glucose, Bld: 88 mg/dL (ref 70–99)
Potassium: 4.4 mmol/L (ref 3.5–5.1)
Sodium: 144 mmol/L (ref 135–145)

## 2024-05-29 LAB — TROPONIN I (HIGH SENSITIVITY)
Troponin I (High Sensitivity): 49 ng/L — ABNORMAL HIGH (ref <18)
Troponin I (High Sensitivity): 50 ng/L — ABNORMAL HIGH (ref <18)

## 2024-05-29 MED ORDER — NITROGLYCERIN 0.4 MG SL SUBL: 0.4000 mg | SUBLINGUAL_TABLET | SUBLINGUAL | Status: DC | PRN

## 2024-05-29 MED ORDER — SODIUM CHLORIDE 0.9% FLUSH
3.0000 mL | Freq: Two times a day (BID) | INTRAVENOUS | Status: DC
  Administered 2024-05-29 – 2024-05-30 (×2): 3 mL via INTRAVENOUS

## 2024-05-29 MED ORDER — ONDANSETRON HCL 4 MG/2ML IJ SOLN: 4.0000 mg | Freq: Four times a day (QID) | INTRAMUSCULAR | Status: DC | PRN

## 2024-05-29 MED ORDER — ROSUVASTATIN CALCIUM 20 MG PO TABS
20.0000 mg | ORAL_TABLET | Freq: Every day | ORAL | Status: DC
  Administered 2024-05-30: 20 mg via ORAL
  Filled 2024-05-29: qty 1

## 2024-05-29 MED ORDER — ACETAMINOPHEN 650 MG RE SUPP: 650.0000 mg | Freq: Four times a day (QID) | RECTAL | Status: DC | PRN

## 2024-05-29 MED ORDER — SODIUM CHLORIDE 0.9 % IV SOLN: 250.0000 mL | INTRAVENOUS | Status: DC | PRN

## 2024-05-29 MED ORDER — ESCITALOPRAM OXALATE 10 MG PO TABS
5.0000 mg | ORAL_TABLET | Freq: Every day | ORAL | Status: DC
  Administered 2024-05-30: 5 mg via ORAL
  Filled 2024-05-29: qty 1

## 2024-05-29 MED ORDER — ASPIRIN 81 MG PO TBEC
81.0000 mg | DELAYED_RELEASE_TABLET | Freq: Every day | ORAL | Status: DC
  Administered 2024-05-30: 81 mg via ORAL
  Filled 2024-05-29: qty 1

## 2024-05-29 MED ORDER — FINASTERIDE 5 MG PO TABS
5.0000 mg | ORAL_TABLET | Freq: Every day | ORAL | Status: DC
  Administered 2024-05-30: 5 mg via ORAL
  Filled 2024-05-29: qty 1

## 2024-05-29 MED ORDER — ACETAMINOPHEN 325 MG PO TABS: 650.0000 mg | ORAL_TABLET | Freq: Four times a day (QID) | ORAL | Status: DC | PRN

## 2024-05-29 MED ORDER — SODIUM CHLORIDE 0.9% FLUSH: 3.0000 mL | INTRAVENOUS | Status: DC | PRN

## 2024-05-29 MED ORDER — ONDANSETRON HCL 4 MG PO TABS: 4.0000 mg | ORAL_TABLET | Freq: Four times a day (QID) | ORAL | Status: DC | PRN

## 2024-05-29 MED ORDER — RIVASTIGMINE 4.6 MG/24HR TD PT24
4.6000 mg | MEDICATED_PATCH | Freq: Every day | TRANSDERMAL | Status: DC
  Administered 2024-05-30: 4.6 mg via TRANSDERMAL
  Filled 2024-05-29: qty 1

## 2024-05-29 MED ORDER — ENOXAPARIN SODIUM 40 MG/0.4ML IJ SOSY
40.0000 mg | PREFILLED_SYRINGE | Freq: Every day | INTRAMUSCULAR | Status: DC
  Administered 2024-05-29: 40 mg via SUBCUTANEOUS
  Filled 2024-05-29: qty 0.4

## 2024-05-29 MED ORDER — MELATONIN 5 MG PO TABS
10.0000 mg | ORAL_TABLET | Freq: Every day | ORAL | Status: DC
Start: 2024-05-29 — End: 2024-05-30
  Administered 2024-05-29: 10 mg via ORAL
  Filled 2024-05-29: qty 2

## 2024-05-29 MED ORDER — SODIUM CHLORIDE 0.9% FLUSH
3.0000 mL | Freq: Two times a day (BID) | INTRAVENOUS | Status: DC
  Administered 2024-05-29: 3 mL via INTRAVENOUS

## 2024-05-29 NOTE — H&P (Signed)
 History and Physical    Carl Figueroa FMW:[id number] DOB: [birth year] DOA: 05/29/2024  PCP: Epifanio Alm SQUIBB, MD   Patient coming from: Home   Chief Complaint:  Chief Complaint  Patient presents with   Chest Pain   ED TRIAGE note:Pt arrives from [facility] with a complaint of L sided CP radiating to the shoulder and arm. Reports it started this morning. Hx of MI.  Received 2 nitros and 325 ASA in route from EMS.   HPI:  Carl Figueroa is a 78 y.o. 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  The chest pain get exacerbated while patient was participating aerobic dance and stop the exercise due to the pain.  EMS called.  Per chart review patient previously being admitted for NSTEMI treated with IV heparin  drip and cardiology recommended conservative management.  Patient was eventually discharged home with aspirin  and Crestor .  Due to high risk her for from underlying liver dementia not a good candidate for dual antiplatelet therapy.   During my evaluation at the bedside patient denies any chest pain, chest pressure, palpitation or shortness of breath.  Patient does not have any other complaint at this time.  Heart   ED Course:  At presentation to ED heart rate in between 49-53.  Blood pressure within good range.  Otherwise hemodynamically stable.  CBC unremarkable.  BMP unremarkable renal function at baseline. Flat troponin 49 and 50.  Chest x-ray no active disease process.  ED physician reported that EMS gave aspirin  load and after 2 doses of sublingual nitroglycerin chest pain has been improved.  Hospitalist has been consulted for further pressure management of  chest pain and elevated troponin. Discussed case with cardiology given patient has flat troponin and no EKG changes cardiology will see patient in the morning for consult.  Significant labs in the ED: Lab Orders         Basic metabolic panel         CBC         CBC         Comprehensive metabolic panel       Review of Systems:  Review of Systems  Constitutional:  Negative for chills and fever.  HENT:  Negative for hearing loss and tinnitus.   Eyes:  Negative for blurred vision.  Respiratory:  Negative for cough and shortness of breath.   Cardiovascular:  Negative for chest pain, palpitations and leg swelling.  Gastrointestinal:  Negative for nausea.  Neurological:  Negative for dizziness and headaches.  Psychiatric/Behavioral:  The patient is not nervous/anxious.     Past Medical History:  Diagnosis Date   Anxiety    Arthritis    Hypertension    Lewy body dementia (HCC)    TBI (traumatic brain injury) (HCC)     Past Surgical History:  Procedure Laterality Date   COLON SURGERY     COLONOSCOPY     COLOSTOMY Right    FOOT SURGERY     X2   HERNIA REPAIR Right    INGUINAL    IR RADIOLOGIST EVAL & MGMT  08/08/2022   perforated bowel  05/2022   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   Procedure: TOTAL HIP ARTHROPLASTY ANTERIOR APPROACH;  Surgeon: Kathlynn Sharper, MD;  Location: [facility] ORS;  Service: Orthopedics;  Laterality: Left;     reports that he quit smoking about 44 years ago. His smoking use included cigarettes. He started smoking about 64 years ago. He has a 40 pack-year smoking history. He has never used smokeless tobacco. He reports that he does not drink alcohol and does not use drugs.  Allergies  Allergen Reactions   Dust Mite Extract Other (See Comments)    -seasonal allergies   Morphine Nausea Only    Family History  Problem Relation Age of Onset   Cancer Mother    Diabetes Mellitus II Neg Hx     Prior to Admission medications   Medication Sig Start  Date End Date Taking? Authorizing Provider  aspirin  EC 81 MG tablet Take 1 tablet (81 mg total) by mouth daily. Swallow whole. 01/23/24  Yes Kandis Perkins, DO  escitalopram  (LEXAPRO ) 5 MG tablet Take 5 mg by mouth daily. 01/23/23  Yes [provider]  finasteride  (PROSCAR ) 5 MG tablet Take 5 mg by mouth daily. 03/03/24  Yes [provider]  lactase (LACTAID) 3000 units tablet Take 1 tablet by mouth daily as needed. 03/03/24  Yes [provider]  Melatonin 5 MG CAPS Take 2 capsules by mouth at bedtime.   Yes [provider]  nitroGLYCERIN (NITROSTAT) 0.4 MG SL tablet Place 0.4 mg under the tongue. 01/29/24  Yes [provider]  rivastigmine  (EXELON ) 4.6 mg/24hr Place 4.6 mg onto the skin daily. 12/06/22 05/29/25 Yes [provider]  rosuvastatin  (CRESTOR ) 20 MG tablet Take 1 tablet (20 mg total) by mouth daily. Patient not taking: Reported on 05/29/2024 01/23/24   Kandis Perkins, DO     Physical Exam: Vitals:   05/30/24 0004 05/30/24 0037 05/30/24 0100 05/30/24 0115  BP: (!) 107/49 96/71 (!) 101/59 (!) 107/49  Pulse: 60 (!) 58 71 61  Resp: 16 18 17  (!) 23  Temp:      TempSrc:      SpO2: 100% 100% 100% 100%  Weight:      Height:        Physical Exam Constitutional:      General: He is not in acute distress.    Appearance: He is well-developed. He is not ill-appearing.  Cardiovascular:     Rate and Rhythm: Normal rate and regular rhythm.     Heart sounds: No murmur heard. Pulmonary:     Effort: Pulmonary effort is normal.     Breath sounds: Normal breath sounds.  Musculoskeletal:        General: Normal range of motion.  Skin:    Capillary Refill: Capillary refill takes less than 2 seconds.  Neurological:     Mental Status: He is alert and oriented to person, place, and time.      Labs on Admission: I have personally reviewed following labs and imaging studies  CBC: Recent Labs  Lab 05/29/24 1740 05/30/24 0100  WBC 5.1 5.0   HGB 12.2* 11.5*  HCT 38.0* 36.6*  MCV 87.2 88.4  PLT 155 128*   Basic Metabolic Panel: Recent Labs  Lab 05/29/24 1740 05/30/24 0100  NA 144 144  K 4.4 3.8  CL 113* 114*  CO2 25 20*  GLUCOSE 88 98  BUN 13 15  CREATININE 1.51* 1.56*  CALCIUM  9.2 8.9   GFR: Estimated Creatinine Clearance: 35.2 mL/min (A) (by C-G formula based on SCr of 1.56 mg/dL (H)). Liver Function Tests: Recent Labs  Lab 05/30/24 0100  AST 16  ALT 10  ALKPHOS 48  BILITOT 0.6  PROT 5.1*  ALBUMIN 2.8*   No results for input(s): LIPASE, AMYLASE in the last 168 hours. No results for input(s): AMMONIA in the last 168 hours. Coagulation Profile: No results for input(s): INR, PROTIME in the last 168 hours. Cardiac Enzymes: Recent Labs  Lab 05/29/24 1740 05/29/24 1944 05/29/24 2324  TROPONINIHS 49* 50* 55*   BNP (last 3 results) Recent Labs    03/27/24 1100  BNP 55.0   HbA1C: No results for input(s): HGBA1C in the last 72 hours. CBG: No results for input(s): GLUCAP in the last 168 hours. Lipid Profile: No results for input(s): CHOL, HDL, LDLCALC, TRIG, CHOLHDL, LDLDIRECT in the last 72 hours. Thyroid  Function Tests: No results for input(s): TSH, T4TOTAL, FREET4, T3FREE, THYROIDAB in the last 72 hours. Anemia Panel: No results for input(s): VITAMINB12, FOLATE, FERRITIN, TIBC, IRON, RETICCTPCT in the last 72 hours. Urine analysis:    Component Value Date/Time   COLORURINE YELLOW (A) 03/13/2023 1046   APPEARANCEUR CLEAR (A) 03/13/2023 1046   LABSPEC 1.011 03/13/2023 1046   PHURINE 5.0 03/13/2023 1046   GLUCOSEU NEGATIVE 03/13/2023 1046   HGBUR NEGATIVE 03/13/2023 1046   BILIRUBINUR NEGATIVE 03/13/2023 1046   KETONESUR NEGATIVE 03/13/2023 1046   PROTEINUR NEGATIVE 03/13/2023 1046   NITRITE NEGATIVE 03/13/2023 1046   LEUKOCYTESUR NEGATIVE 03/13/2023 1046    Radiological Exams on Admission: I have personally reviewed images DG Chest 2  View Result Date: 05/29/2024 CLINICAL DATA:  Chest pain. EXAM: CHEST - 2 VIEW COMPARISON:  March 27, 2024 FINDINGS: The heart size and mediastinal contours are within normal limits. Both lungs are clear. Multilevel degenerative changes are seen throughout the thoracic spine. IMPRESSION: No active cardiopulmonary disease. Electronically Signed   By: Suzen Dials M.D.   On: 05/29/2024 15:21     EKG: My personal interpretation of EKG shows: EKG showed normal sinus rhythm heart rate 56.    Assessment/Plan: Principal Problem:   Elevated troponin I level Active Problems:   Chronic diastolic CHF (congestive heart failure) (HCC)   CKD (chronic kidney disease) stage 3, GFR 30-59 ml/min (HCC)   Essential hypertension   Generalized anxiety disorder   Parkinson disease (HCC)   History of mood disorder   History of BPH   Sinus bradycardia    Assessment and Plan: Chest pain Elevated troponin History of NSTEMI in 01/2024 -Presented to emergency department complaining of development of left-sided chest pain that radiates to to left-sided arm while he was doing aerobic dancing.  EMS gave aspirin  load and sublingual nitroglycerin who is resolved with chest pain - Presentation to ED patient found bradycardic heart rate in between 49-53.  Otherwise hemodynamically stable. -CBC unremarkable.  BMP unremarkable renal function at baseline. Flat troponin 49 and 50. Chest x-ray no active disease process. -EKG showed normal sinus rhythm heart rate 56. - Currently patient does not have any chest pain. - Patient has similar presentation in June 2025 during that time troponin peaked to 102 required IV heparin  drip for 24 hours.  Echocardiogram did not show any wall motion abnormality and cardiology recommended medical management with aspirin  and Lipitor  and discharge to home. -Per chart review patient has abnormal stress test July 2025.  Cardiology stated that possibly patient has some calcification that  specific location that causing abnormal test. - Again patient coming with similar presentation however this time patient has flat troponin. - Continue to trend troponin.  If troponin peaks up with positive delta will initiate IV heparin  drip. - Continue aspirin   and Crestor . -Continue to trend troponin - Obtaining echocardiogram - Discussed case with on-call cardiology Dr. Duffy given patient has flat troponin and no EKG changes cardiology will see patient in the morning for consult.   Essential hypertension Chronic diastolic heart failure -At home patient is not any blood pressure regimen.  Sinus bradycardia -Heart rate in between 49-56.  Continue to monitor heart rate.  Generalized anxiety disorder History of Parkinson disease History of Lewy body dementia -Continue Lexapro  and rivastigmine  4.6 transdermal daily  CKD 3a -Creatinine 1.51 and GFR 47.  Renal function at baseline.  BPH -Continue Prozac   DVT prophylaxis:  Lovenox  Code Status:  Full Code Diet: Heart healthy diet Disposition Plan: Flat troponin.  Need to follow-up with echocardiogram. Consults: Cardiology Admission status:   Inpatient, Telemetry bed  Severity of Illness: The appropriate patient status for this patient is INPATIENT. Inpatient status is judged to be reasonable and necessary in order to provide the required intensity of service to ensure the patient's safety. The patient's presenting symptoms, physical exam findings, and initial radiographic and laboratory data in the context of their chronic comorbidities is felt to place them at high risk for further clinical deterioration. Furthermore, it is not anticipated that the patient will be medically stable for discharge from the hospital within 2 midnights of admission.   * I certify that at the point of admission it is my clinical judgment that the patient will require inpatient hospital care spanning beyond 2 midnights from the point of admission due to  high intensity of service, high risk for further deterioration and high frequency of surveillance required.DEWAINE    Noemie Devivo, MD [facility]  How to contact the [facility] Attending or Consulting provider 7A - 7P or covering provider during after hours 7P -7A, for this patient.  Check the care team in [facility] and look for a) attending/consulting [facility] provider listed and b) the [facility] team listed Log into [email or web address] and use [place]'s universal password to access. If you do not have the password, please contact the hospital operator. Locate the [facility] provider you are looking for under [facility] and page to a number that you can be directly reached. If you still have difficulty reaching the provider, please page the [facility] (Director on Call) for the Hospitalists listed on amion for assistance.  05/30/2024, 2:12 AM     Superobesity

## 2024-05-29 NOTE — ED Provider Notes (Signed)
 [facility] EMERGENCY DEPARTMENT AT [facility] Provider Note   CSN: [id number] Arrival date & time: 05/29/24  1413     Patient presents with: Chest Pain   Carl Figueroa is a 78 y.o. male.   This is a 78 year old male presenting to the emergency department by EMS from [facility] with a chief complaint of chest pain.  Central chest radiating to his left shoulder.  Described as pressure.  Received nitro and aspirin  from EMS.  Reports chest pain has resolved.  No risk factors for PE.   Chest Pain      Prior to Admission medications   Medication Sig Start Date End Date Taking? Authorizing Provider  aspirin  EC 81 MG tablet Take 1 tablet (81 mg total) by mouth daily. Swallow whole. 01/23/24   Kandis Perkins, DO  escitalopram  (LEXAPRO ) 5 MG tablet Take 5 mg by mouth daily. 01/23/23   [provider]  Melatonin 5 MG CAPS Take 2 capsules by mouth at bedtime.    [provider]  nitroGLYCERIN (NITROSTAT) 0.4 MG SL tablet Place 0.4 mg under the tongue. 01/29/24   [provider]  rivastigmine  (EXELON ) 4.6 mg/24hr Place 4.6 mg onto the skin daily. 12/06/22 04/09/24  [provider]  rosuvastatin  (CRESTOR ) 20 MG tablet Take 1 tablet (20 mg total) by mouth daily. 01/23/24   Kandis Perkins, DO    Allergies: Dust mite extract and Morphine    Review of Systems  Cardiovascular:  Positive for chest pain.    Updated Vital Signs BP 132/68 (BP Location: Right Arm)   Pulse 68   Temp 98.1 F (36.7 C) (Oral)   Resp (!) 22   Wt 69.4 kg   SpO2 100%   BMI 24.69 kg/m   Physical Exam Vitals and nursing note reviewed.  Constitutional:      General: He is not in acute distress.    Appearance: He is not toxic-appearing.  HENT:     Head: Normocephalic.  Cardiovascular:     Rate and Rhythm: Normal rate and regular rhythm.     Pulses:          Radial pulses are 2+ on the right side and 2+ on the left side.     Heart sounds: Normal heart  sounds.  Pulmonary:     Effort: Pulmonary effort is normal.     Breath sounds: Normal breath sounds.  Abdominal:     Palpations: Abdomen is soft.  Musculoskeletal:     Cervical back: Normal range of motion.     Right lower leg: No edema.     Left lower leg: No edema.  Skin:    General: Skin is warm and dry.     Capillary Refill: Capillary refill takes less than 2 seconds.  Neurological:     General: No focal deficit present.     Mental Status: He is alert.  Psychiatric:        Mood and Affect: Mood normal.        Behavior: Behavior normal.     (all labs ordered are listed, but only abnormal results are displayed) Labs Reviewed  BASIC METABOLIC PANEL WITH GFR  CBC  TROPONIN I (HIGH SENSITIVITY)    EKG: None  Radiology: No results found.   Procedures   Medications Ordered in the ED - No data to display  Clinical Course as of 05/29/24 1447  Fri May 29, 2024  1423 Cardiology note at [facility] 03/30/2024: CAD- recent NSTEMI - He was  admitted June 3 through 5 when he presented with chest pressure while participating in an aerobic dance class it was relieved when he stopped his exercise. He was found to have elevated blood pressure and EMS was called. During the hospitalization he was found to have a non-ST elevation MI. He was seen by cardiology who recommended treating conservatively. He was treated with IV heparin  for 24 hours but no further invasive testing was done. He had an echocardiogram done which showed an EF of 70 to 75% the left ventricle was hyperdynamic and there was grade 1 diastolic dysfunction. He had a bicuspid aortic valve but no stenosis or regurgitation. Had stress test  He is supposed to take crestor  and aspirin - but is not taking Advised at least take aspirin . Says statin makes him feel bad.   [TY]    Clinical Course User Index [TY] Neysa Caron PARAS, DO                                 Medical Decision Making This is a 78 year old male complex past  medical history include hypertension, and dementia, prior TBI, CAD with NSTEMI in June who is presenting today for chest pain.  He was at [facility] and was exerting himself while dancing when he developed his chest pain.  He received nitro and aspirin  and route per EMS and his chest pain has resolved.  He is currently chest pain-free.  Cardiac workup underway.  Low risk for PE.  Care signed out to afternoon team, disposition pending workup.  Amount and/or Complexity of Data Reviewed External Data Reviewed:     Details: See ED course Labs: ordered. Decision-making details documented in ED Course. Radiology: ordered.  Risk Decision regarding hospitalization. Diagnosis or treatment significantly limited by social determinants of health. Risk Details: Dementia       Final diagnoses:  None    ED Discharge Orders     None          Neysa Caron PARAS, DO 05/29/24 1447

## 2024-05-29 NOTE — ED Triage Notes (Addendum)
 Pt arrives from [facility] with a complaint of L sided CP radiating to the shoulder and arm. Reports it started this morning. Hx of MI.  Received 2 nitros and 325 ASA in route from EMS.

## 2024-05-29 NOTE — ED Provider Notes (Signed)
  Physical Exam  BP 132/62   Pulse (!) 54   Temp 98 F (36.7 C) (Oral)   Resp (!) 22   Ht 5' 6 (1.676 m)   Wt 69.4 kg   SpO2 100%   BMI 24.69 kg/m   Physical Exam Vitals and nursing note reviewed.  HENT:     Head: Normocephalic and atraumatic.  Eyes:     Pupils: Pupils are equal, round, and reactive to light.  Cardiovascular:     Rate and Rhythm: Normal rate and regular rhythm.  Pulmonary:     Effort: Pulmonary effort is normal.     Breath sounds: Normal breath sounds.  Abdominal:     General: There is no distension.     Palpations: Abdomen is soft.     Tenderness: There is no abdominal tenderness.     Comments: Ostomy bag in place  Skin:    General: Skin is warm and dry.  Neurological:     Mental Status: He is alert.  Psychiatric:        Mood and Affect: Mood normal.     Procedures  Procedures  ED Course / MDM   Clinical Course as of 05/29/24 2124  Fri May 29, 2024  1423 Cardiology note at [facility] 03/30/2024: CAD- recent NSTEMI - He was admitted June 3 through 5 when he presented with chest pressure while participating in an aerobic dance class it was relieved when he stopped his exercise. He was found to have elevated blood pressure and EMS was called. During the hospitalization he was found to have a non-ST elevation MI. He was seen by cardiology who recommended treating conservatively. He was treated with IV heparin  for 24 hours but no further invasive testing was done. He had an echocardiogram done which showed an EF of 70 to 75% the left ventricle was hyperdynamic and there was grade 1 diastolic dysfunction. He had a bicuspid aortic valve but no stenosis or regurgitation. Had stress test  He is supposed to take crestor  and aspirin - but is not taking Advised at least take aspirin . Says statin makes him feel bad.   [TY]  2119 Delta troponin flat.  Patient mains chest pain-free hemodynamically stable at this time.  Discussed prior diagnosis of coronary artery disease  and NSTEMI with the patient and his wife at bedside.  Considering high risk chest pain (heart score detailed below) will admit to medicine for obs telemetry  HEART Score for Major Cardiac Events from [email or web address] on 05/29/2024 ** All calculations should be rechecked by clinician prior to use **  RESULT SUMMARY: 7 points High Score (7-10 points)  Risk of MACE of 50-65%.   INPUTS: History -> 2 = Highly suspicious EKG -> 0 = Normal Age -> 2 = >=65 Risk factors -> 2 = >=3 risk factors or history of atherosclerotic disease Initial troponin -> 1 = 1-3 normal limit  [MP]    Clinical Course User Index [MP] Pamella Ozell LABOR, DO [TY] Neysa Caron PARAS, DO   Medical Decision Making I, Ozell Pamella DO, have assumed care of this patient from the previous provider pending laboratory workup reevaluation and likely admission  Amount and/or Complexity of Data Reviewed Labs: ordered. Radiology: ordered.  Risk Decision regarding hospitalization.          Pamella Ozell LABOR, DO 05/29/24 2124

## 2024-05-30 ENCOUNTER — Encounter (HOSPITAL_COMMUNITY): Payer: Self-pay | Admitting: Internal Medicine

## 2024-05-30 ENCOUNTER — Inpatient Hospital Stay (HOSPITAL_COMMUNITY)

## 2024-05-30 DIAGNOSIS — Z933 Colostomy status: Secondary | ICD-10-CM

## 2024-05-30 DIAGNOSIS — E785 Hyperlipidemia, unspecified: Secondary | ICD-10-CM

## 2024-05-30 DIAGNOSIS — N1832 Chronic kidney disease, stage 3b: Secondary | ICD-10-CM | POA: Diagnosis not present

## 2024-05-30 DIAGNOSIS — I5032 Chronic diastolic (congestive) heart failure: Secondary | ICD-10-CM | POA: Diagnosis not present

## 2024-05-30 DIAGNOSIS — R079 Chest pain, unspecified: Secondary | ICD-10-CM

## 2024-05-30 DIAGNOSIS — R7989 Other specified abnormal findings of blood chemistry: Secondary | ICD-10-CM | POA: Diagnosis not present

## 2024-05-30 DIAGNOSIS — F0280 Dementia in other diseases classified elsewhere without behavioral disturbance: Secondary | ICD-10-CM

## 2024-05-30 DIAGNOSIS — G3183 Dementia with Lewy bodies: Secondary | ICD-10-CM

## 2024-05-30 LAB — CBC
HCT: 36.6 % — ABNORMAL LOW (ref 39.0–52.0)
Hemoglobin: 11.5 g/dL — ABNORMAL LOW (ref 13.0–17.0)
MCH: 27.8 pg (ref 26.0–34.0)
MCHC: 31.4 g/dL (ref 30.0–36.0)
MCV: 88.4 fL (ref 80.0–100.0)
Platelets: 128 K/uL — ABNORMAL LOW (ref 150–400)
RBC: 4.14 MIL/uL — ABNORMAL LOW (ref 4.22–5.81)
RDW: 13.2 % (ref 11.5–15.5)
WBC: 5 K/uL (ref 4.0–10.5)
nRBC: 0 % (ref 0.0–0.2)

## 2024-05-30 LAB — COMPREHENSIVE METABOLIC PANEL WITH GFR
ALT: 10 U/L (ref 0–44)
AST: 16 U/L (ref 15–41)
Albumin: 2.8 g/dL — ABNORMAL LOW (ref 3.5–5.0)
Alkaline Phosphatase: 48 U/L (ref 38–126)
Anion gap: 10 (ref 5–15)
BUN: 15 mg/dL (ref 8–23)
CO2: 20 mmol/L — ABNORMAL LOW (ref 22–32)
Calcium: 8.9 mg/dL (ref 8.9–10.3)
Chloride: 114 mmol/L — ABNORMAL HIGH (ref 98–111)
Creatinine, Ser: 1.56 mg/dL — ABNORMAL HIGH (ref 0.61–1.24)
GFR, Estimated: 45 mL/min — ABNORMAL LOW (ref >60)
Glucose, Bld: 98 mg/dL (ref 70–99)
Potassium: 3.8 mmol/L (ref 3.5–5.1)
Sodium: 144 mmol/L (ref 135–145)
Total Bilirubin: 0.6 mg/dL (ref 0.0–1.2)
Total Protein: 5.1 g/dL — ABNORMAL LOW (ref 6.5–8.1)

## 2024-05-30 LAB — TROPONIN I (HIGH SENSITIVITY)
Troponin I (High Sensitivity): 55 ng/L — ABNORMAL HIGH (ref <18)
Troponin I (High Sensitivity): 49 ng/L — ABNORMAL HIGH (ref <18)

## 2024-05-30 NOTE — Consult Note (Addendum)
 Cardiology Consultation   Patient ID: Carl Figueroa MRN: [id number]; DOB: [birth year]  Admit date: 05/29/2024 Date of Consult: 05/30/2024  PCP:  Epifanio Alm SQUIBB, MD   [facility] Providers Cardiologist:  Lonni Hanson, MD      Patient Profile: Carl Figueroa is a 78 y.o. male with a hx of underlying dementia, history of traumatic brain injury, chronic low back/leg pain, CKD stage IIIa, BPH, hypertension, iron deficiency anemia, history of total colectomy and ileostomy in 05/2022 who is being seen 05/30/2024 for the evaluation of chest pain at the request of Dr. Laurence.  History of Present Illness: Carl Figueroa is a 78 year old male with above medical history.  Per chart review, patient had been admitted in 01/2024 with left arm pain.  Found to have elevated troponin peaking at 101.  Underwent echocardiogram 01/23/2024 that showed EF 70-75%, no regional wall motion abnormalities, mild LVH, grade 1 DD, normal RV systolic function.  Aortic valve was bicuspid, no regurgitation or stenosis noted.  Due to patient's dementia he was a poor historian and it was challenging to obtain his history.  Patient was managed with 24 hours of IV heparin , started on aspirin  and statin.  Patient underwent outpatient stress test on 03/06/2024.  There was no evidence of significant ischemia.  There was a fixed inferior/inferoseptal defect with normal wall motion that was most consistent with diaphragmatic attenuation.  LV systolic function normal.  Overall was a low risk study  Patient presented to the ED on 10/10 complaining of L sided chest pain that had started that morning. Pain described as pressure. Given nitroglycerin and ASA with EMS and pain resolved. EKG showed normal sinus rhythm with heart rate 56 bpm, nonspecific T wave abnormalities in lead III, aVF.  Unchanged when compared to EKG from 03/27/2024. Labs significant for hsTn 49>50>55>49. Creatinine 1.51. CXR showed no active  cardiopulmonary disease.   On interview, patient tells me that he came to the ED because he had a brief episode of left shoulder pain that traveled to his chest. Described the sensation as tiny people being in his shoulder, and he could not control them. Pain occurred while he was dancing. Resolved after he received aspirin  and nitroglycerin. He has not had chest pain since. He denies shortness of breath. He has been having both right and left shoulder pain for a while now. Pain worsens if he moves his arms or tries to lift them above his head.   Past Medical History:  Diagnosis Date   Anxiety    Arthritis    Hypertension    Lewy body dementia (HCC)    TBI (traumatic brain injury) (HCC)     Past Surgical History:  Procedure Laterality Date   COLON SURGERY     COLONOSCOPY     COLOSTOMY Right    FOOT SURGERY     X2   HERNIA REPAIR Right    INGUINAL    IR RADIOLOGIST EVAL & MGMT  08/08/2022   perforated bowel  05/2022   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   Procedure: TOTAL HIP ARTHROPLASTY ANTERIOR APPROACH;  Surgeon: Kathlynn Sharper, MD;  Location: [facility] ORS;  Service: Orthopedics;  Laterality: Left;     Scheduled Meds:  aspirin  EC  81 mg Oral Daily   enoxaparin  (LOVENOX ) injection  40 mg Subcutaneous QHS   escitalopram   5 mg Oral Daily   finasteride   5 mg Oral Daily   melatonin  10 mg Oral QHS   rivastigmine   4.6 mg  Transdermal Daily   rosuvastatin   20 mg Oral Daily   sodium chloride  flush  3 mL Intravenous Q12H   sodium chloride  flush  3 mL Intravenous Q12H   Continuous Infusions:  sodium chloride      PRN Meds: sodium chloride , acetaminophen  **OR** acetaminophen , nitroGLYCERIN, ondansetron  **OR** ondansetron  (ZOFRAN ) IV, sodium chloride  flush  Allergies:    Allergies  Allergen Reactions   Dust Mite Extract Other (See Comments)    -seasonal allergies   Morphine Nausea Only    Social History:   Social History   Socioeconomic History   Marital status: Married     Spouse name: Not on file   Number of children: Not on file   Years of education: Not on file   Highest education level: Not on file  Occupational History   Not on file  Tobacco Use   Smoking status: Former    Current packs/day: 0.00    Average packs/day: 2.0 packs/day for 20.0 years (40.0 ttl pk-yrs)    Types: Cigarettes    Start date: 10/22/1959    Quit date: 10/22/1979    Years since quitting: 44.6   Smokeless tobacco: Never  Vaping Use   Vaping status: Never Used  Substance and Sexual Activity   Alcohol use: No   Drug use: No   Sexual activity: Not on file  Other Topics Concern   Not on file  Social History Narrative   Not on file   Social Drivers of Health   Financial Resource Strain: Low Risk  (04/29/2024)   Received from [facility]   Overall Financial Resource Strain (CARDIA)    Difficulty of Paying Living Expenses: Not hard at all  Food Insecurity: No Food Insecurity (04/29/2024)   Received from [facility]   Hunger Vital Sign    Within the past 12 months, you worried that your food would run out before you got the money to buy more.: Never true    Within the past 12 months, the food you bought just didn't last and you didn't have money to get more.: Never true  Transportation Needs: Unmet Transportation Needs (04/29/2024)   Received from [facility] - Transportation    In the past 12 months, has lack of transportation kept you from medical appointments or from getting medications?: Yes    Lack of Transportation (Non-Medical): No  Physical Activity: Insufficiently Active (04/29/2024)   Received from [facility]   Exercise Vital Sign    On average, how many days per week do you engage in moderate to strenuous exercise (like a brisk walk)?: 2 days    On average, how many minutes do you engage in exercise at this level?: 20 min  Stress: Stress Concern Present (04/29/2024)   Received from  [facility] - Occupational Stress Questionnaire    Feeling of Stress : Rather much  Social Connections: Socially Integrated (04/29/2024)   Received from [facility]   Social Connection and Isolation Panel    In a typical week, how many times do you talk on the phone with family, friends, or neighbors?: More than three times a week    How often do you get together with friends or relatives?: Once a week    How often do you attend church or religious services?: More than 4 times per year    Do you belong to any clubs or organizations  such as church groups, unions, fraternal or athletic groups, or school groups?: Yes    How often do you attend meetings of the clubs or organizations you belong to?: More than 4 times per year    Are you married, widowed, divorced, separated, never married, or living with a partner?: Married  Intimate Partner Violence: Not At Risk (01/22/2024)   Humiliation, Afraid, Rape, and Kick questionnaire    Fear of Current or Ex-Partner: No    Emotionally Abused: No    Physically Abused: No    Sexually Abused: No    Family History:   Family History  Problem Relation Age of Onset   Cancer Mother    Diabetes Mellitus II Neg Hx      ROS:  Please see the history of present illness.  All other ROS reviewed and negative.     Physical Exam/Data: Vitals:   05/30/24 0430 05/30/24 0445 05/30/24 0700 05/30/24 0735  BP:  (!) 109/53 110/61   Pulse: (!) 52 (!) 52 (!) 51   Resp: 16 16 11    Temp:    97.9 F (36.6 C)  TempSrc:    Oral  SpO2: 100% 100% 100%   Weight:      Height:        Intake/Output Summary (Last 24 hours) at 05/30/2024 0752 Last data filed at 05/30/2024 0128 Gross per 24 hour  Intake --  Output 50 ml  Net -50 ml      05/29/2024    5:22 PM 05/29/2024    2:17 PM 04/09/2024    1:54 PM  Last 3 Weights  Weight (lbs) 153 lb 153 lb 154 lb 6.4 oz  Weight (kg) 69.4 kg 69.4 kg  70.035 kg     Body mass index is 24.69 kg/m.  General:  Thin, elderly male. Laying in the bed with head elevated, eating breakfast.  HEENT: normal Neck: no JVD Vascular: Radial pulses 2+ bilaterally Cardiac:  normal S1, S2; RRR; no murmur   Lungs:  clear to auscultation bilaterally, no wheezing, rhonchi or rales. Normal WOB on room air  Abd: soft, nontender  Ext: no edema in BLE  Musculoskeletal:  No deformities. Left shoulder is tender to palpation  Skin: warm and dry  Neuro:  CNs 2-12 intact, no focal abnormalities noted Psych:  Normal affect   EKG:  The EKG was personally reviewed and demonstrates:  EKG showed normal sinus rhythm with heart rate 56 bpm, nonspecific T wave abnormalities in lead III, aVF, minimal ST elevation in V3.  Unchanged when compared to EKG from 03/27/2024 Telemetry:  Telemetry was personally reviewed and demonstrates:  NSR, sinus bradycardia with HR in the 50s   Relevant CV Studies: Cardiac Studies & Procedures   ______________________________________________________________________________________________   STRESS TESTS  NM MYOCAR MULTI W/SPECT W 03/06/2024  Narrative   Probably normal pharmacologic myocardial perfusion stress test without evidence of significant ischemia.   Fixed inferior/inferoseptal defect with normal wall motion is most consistent with diaphragmatic attenuation and less likely scar.   Left ventricular systolic function is normal (LVEF greater than 65%).   Allowing for differences in technique, no significant change from prior study on 09/04/2019.   This is a low risk study.   ECHOCARDIOGRAM  ECHOCARDIOGRAM COMPLETE 01/23/2024  Narrative ECHOCARDIOGRAM REPORT    Patient Name:   BARACK NICODEMUS Date of Exam: 01/23/2024 Medical Rec #:  [id number]            Height:       67.0  in Accession #:    [id number]           Weight:       151.5 lb Date of Birth:  Sep 13, 1945            BSA:          1.797 m Patient Age:    78 years              BP:           118/70 mmHg Patient Gender: M                    HR:           55 bpm. Exam Location:  Inpatient  Procedure: 2D Echo, Cardiac Doppler and Color Doppler (Both Spectral and Color Flow Doppler were utilized during procedure).  Indications:    NSTEMI  History:        Patient has prior history of Echocardiogram examinations, most recent 03/14/2023. Risk Factors:Hypertension.  Sonographer:    Philomena Daring Referring Phys: [id number] ELSIE KATHEE SAVANNAH  IMPRESSIONS   1. Left ventricular ejection fraction, by estimation, is 70 to 75%. The left ventricle has hyperdynamic function. The left ventricle has no regional wall motion abnormalities. There is mild left ventricular hypertrophy. Left ventricular diastolic parameters are consistent with Grade I diastolic dysfunction (impaired relaxation). 2. Right ventricular systolic function is normal. The right ventricular size is normal. 3. The mitral valve is normal in structure. No evidence of mitral valve regurgitation. No evidence of mitral stenosis. 4. The aortic valve is bicuspid. Aortic valve regurgitation is not visualized. No aortic stenosis is present. 5. The inferior vena cava is normal in size with greater than 50% respiratory variability, suggesting right atrial pressure of 3 mmHg.  FINDINGS Left Ventricle: Left ventricular ejection fraction, by estimation, is 70 to 75%. The left ventricle has hyperdynamic function. The left ventricle has no regional wall motion abnormalities. The left ventricular internal cavity size was normal in size. There is mild left ventricular hypertrophy. Left ventricular diastolic parameters are consistent with Grade I diastolic dysfunction (impaired relaxation).  Right Ventricle: The right ventricular size is normal. No increase in right ventricular wall thickness. Right ventricular systolic function is normal.  Left Atrium: Left atrial size was normal in size.  Right Atrium: Right atrial size was  normal in size.  Pericardium: There is no evidence of pericardial effusion.  Mitral Valve: The mitral valve is normal in structure. No evidence of mitral valve regurgitation. No evidence of mitral valve stenosis.  Tricuspid Valve: The tricuspid valve is normal in structure. Tricuspid valve regurgitation is not demonstrated. No evidence of tricuspid stenosis.  Aortic Valve: The aortic valve is bicuspid. Aortic valve regurgitation is not visualized. No aortic stenosis is present.  Pulmonic Valve: The pulmonic valve was normal in structure. Pulmonic valve regurgitation is not visualized. No evidence of pulmonic stenosis.  Aorta: The aortic root is normal in size and structure.  Venous: The inferior vena cava is normal in size with greater than 50% respiratory variability, suggesting right atrial pressure of 3 mmHg.  IAS/Shunts: No atrial level shunt detected by color flow Doppler.   LEFT VENTRICLE PLAX 2D LVIDd:         3.40 cm   Diastology LVIDs:         1.90 cm   LV e' medial:    4.57 cm/s LV PW:         1.30 cm   LV E/e'  medial:  18.3 LV IVS:        1.40 cm   LV e' lateral:   8.49 cm/s LVOT diam:     1.90 cm   LV E/e' lateral: 9.9 LV SV:         71 LV SV Index:   39 LVOT Area:     2.84 cm   RIGHT VENTRICLE             IVC RV S prime:     12.10 cm/s  IVC diam: 0.90 cm TAPSE (M-mode): 2.0 cm  LEFT ATRIUM             Index        RIGHT ATRIUM           Index LA diam:        2.70 cm 1.50 cm/m   RA Area:     11.70 cm LA Vol (A2C):   43.2 ml 24.04 ml/m  RA Volume:   25.80 ml  14.36 ml/m LA Vol (A4C):   32.3 ml 17.98 ml/m LA Biplane Vol: 38.8 ml 21.59 ml/m AORTIC VALVE LVOT Vmax:   126.00 cm/s LVOT Vmean:  80.700 cm/s LVOT VTI:    0.249 m  AORTA Ao Root diam: 3.00 cm Ao Asc diam:  3.00 cm  MITRAL VALVE MV Area (PHT): 2.99 cm    SHUNTS MV Decel Time: 254 msec    Systemic VTI:  0.25 m MV E velocity: 83.80 cm/s  Systemic Diam: 1.90 cm MV A velocity: 87.50 cm/s MV  E/A ratio:  0.96  Oneil Parchment MD Electronically signed by Oneil Parchment MD Signature Date/Time: 01/23/2024/3:45:28 PM    Final    MONITORS  LONG TERM MONITOR (3-14 DAYS) 06/08/2022  Narrative   The patient was monitored for 14 days.   The predominant rhythm was sinus with an average rate of 63 bpm (range 42-106 bpm in sinus).   There were rare PAC's and PVC's.   Two episodes of nonsustained ventricular tachycardia occurred, lasting up to 20 beats with a maximum rate of 164 bpm.   There were 48 supraventricular runs, lasting up to 11 beats with a maximum rate of 190 bpm.   No sustained arrhythmia or prolonged pause occurred.   Patient triggered events correspond to normal sinus rhythm and sinus bradycardia.  Predominantly sinus rhythm with rare PAC's and PVC's.  Two episodes of NSVT occurred, as well as several episodes of PSVT, as detailed above.       ______________________________________________________________________________________________       Laboratory Data: High Sensitivity Troponin:   Recent Labs  Lab 05/29/24 1740 05/29/24 1944 05/29/24 2324 05/30/24 0100  TROPONINIHS 49* 50* 55* 49*     Chemistry Recent Labs  Lab 05/29/24 1740 05/30/24 0100  NA 144 144  K 4.4 3.8  CL 113* 114*  CO2 25 20*  GLUCOSE 88 98  BUN 13 15  CREATININE 1.51* 1.56*  CALCIUM  9.2 8.9  GFRNONAA 47* 45*  ANIONGAP 6 10    Recent Labs  Lab 05/30/24 0100  PROT 5.1*  ALBUMIN 2.8*  AST 16  ALT 10  ALKPHOS 48  BILITOT 0.6   Lipids No results for input(s): CHOL, TRIG, HDL, LABVLDL, LDLCALC, CHOLHDL in the last 168 hours.  Hematology Recent Labs  Lab 05/29/24 1740 05/30/24 0100  WBC 5.1 5.0  RBC 4.36 4.14*  HGB 12.2* 11.5*  HCT 38.0* 36.6*  MCV 87.2 88.4  MCH 28.0 27.8  MCHC 32.1 31.4  RDW  13.2 13.2  PLT 155 128*   Thyroid  No results for input(s): TSH, FREET4 in the last 168 hours.  BNPNo results for input(s): BNP, PROBNP in the last 168  hours.  DDimer No results for input(s): DDIMER in the last 168 hours.  Radiology/Studies:  DG Chest 2 View Result Date: 05/29/2024 CLINICAL DATA:  Chest pain. EXAM: CHEST - 2 VIEW COMPARISON:  March 27, 2024 FINDINGS: The heart size and mediastinal contours are within normal limits. Both lungs are clear. Multilevel degenerative changes are seen throughout the thoracic spine. IMPRESSION: No active cardiopulmonary disease. Electronically Signed   By: Suzen Dials M.D.   On: 05/29/2024 15:21     Assessment and Plan:  L shoulder pain  Chest pain  - Patient presented after he had a brief episode of left shoulder and chest pain. Started while he was dancing and went away after he received aspirin  and nitroglycerin. He denies recent episodes of chest pain  - Patient has had bilateral shoulder pain for a while now. Pain worsens if he moves or tries to lift his arms. L shoulder tender to palpation on exam  - EKG without ischemic changes. hsTn 49>50>55>49  - Of note, patient seems to have some chronic troponin elevation dating back to 06/2019. Has not had a trop value under the 20s since then - Stress test in 02/2024 was a low risk study without evidence of significant ischemia. In the past, patient has not been a cath candidate due to his dementia  - Presentation and trop trend are not consistent with ACS. Possibly musculoskeletal given L shoulder pain that is reproducible on palpation and that occurs with moving his shoulder  - Echocardiogram pending. Unless significantly changed from previous, I do not think patient will require further cardiac workup  - continue ASA 81 mg daily and crestor  20 mg daily  - BP has been somewhat labile in the ED, ranging from 90/60 to 148/85. Would like to avoid hypotension to low fall risk, no BP room for amlodipine , BB, or imdur at this time. Continue PRN SL Nitroglycerin   - Patient does not have CHF   HLD  - Lipid panel from 01/2024 showed LDL 110, HDL 55,  triglycerides 40  - Continue crestor  20 mg daily   Bicuspid Aortic Valve  - Noted on echo in 01/2024. No stenosis  - Echo this admission pending   Otherwise per primary  - Lewy Body Dementia  - CKD stage IIIa - Parkinson disease  - BPH   Risk Assessment/Risk Scores:   For questions or updates, please contact [facility] Please consult [email or web address] for contact info under    Signed, Rollo FABIENE Louder, PA-C  05/30/2024 7:52 AM  History and all data above reviewed.  I personally took the history today, performed the physical exam and formulated the substantive portion of the assessment and plan.  I reviewed all relevant tests and studies. Patient examined.  I agree with the findings as above.  He reports that he was dancing and had some right upper arm discomfort.  He described it as somewhat sharp.  There was a little bit of discomfort in the left upper chest.  He does not describe associated nausea vomiting diaphoresis.  There is no associated shortness of breath, PND or orthopnea.  He had no palpitations, presyncope or syncope.  The patient exam reveals COR: Regular rate and rhythm, soft systolic murmur,  Lungs: Clear to auscultation bilaterally, no wheezing, no crackles,  Abd: Positive bowel  sounds normal frequency pitch, bruits, rebound, guarding, Ext 2+ pulses, no edema.  All available labs, radiology testing, previous records reviewed.   Agree with documented assessment and plan.   Chest pain: EKG does not suggest an acute ischemic change.  Mild ST elevation in the anterior leads is unchanged from previous EKGs and likely represents repolarization.  Enzymes are nondiagnostic.  Discomfort sounds nonanginal.  He had a low risk perfusion study just couple of weeks ago.  No further cardiovascular testing is suggested.      Lynwood Schilling  9:27 AM  05/30/2024

## 2024-05-30 NOTE — ED Notes (Signed)
 Pt ostomy bag emptied. Slight red tint noted in stool, RN notified.

## 2024-05-30 NOTE — Progress Notes (Signed)
 Echocardiogram 2D Echocardiogram has been performed.  Carl Figueroa 05/30/2024, 11:03 AM

## 2024-05-30 NOTE — Assessment & Plan Note (Addendum)
 05/30/24 stable.

## 2024-05-30 NOTE — Discharge Summary (Signed)
 [facility] Physician Discharge Summary   Patient name: Carl Figueroa  Admit date:     05/29/2024  Discharge date: 05/30/2024  Attending Physician: SUNDIL, SUBRINA [[id number]]  Discharge Physician: Camellia Door   PCP: Epifanio Alm SQUIBB, MD  Admitted From: Home  Disposition:  Home  Recommendations for Outpatient Follow-up:  Follow up with PCP in 1-2 weeks Cardiology office will make follow up appointment. Follow up on echocardiogram results.  Home Health:No Equipment/Devices: None    Discharge Condition:Stable CODE STATUS:FULL Diet recommendation: Heart Healthy Fluid Restriction: None  Hospital Summary: CC: chest pain HPI: [name] is a 78 y.o. 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  The chest pain get exacerbated while patient was participating aerobic dance and stop the exercise due to the pain.  EMS called.   Per chart review patient previously being admitted for NSTEMI treated with IV heparin  drip and cardiology recommended conservative management.  Patient was eventually discharged home with aspirin  and Crestor .  Due to high risk her for from underlying liver dementia not a good candidate for dual antiplatelet therapy.     During my evaluation at the bedside patient denies any chest pain, chest pressure, palpitation or shortness of breath.  Patient does not have any other complaint at this time.  Heart     ED Course:  At presentation to ED heart rate in between 49-53.  Blood pressure within good range.  Otherwise hemodynamically stable.   CBC unremarkable.  BMP unremarkable renal function at baseline. Flat troponin 49 and 50.   Chest x-ray no  active disease process.   ED physician reported that EMS gave aspirin  load and after 2 doses of sublingual nitroglycerin chest pain has been improved.   Hospitalist has been consulted for further pressure management of chest pain and elevated troponin. Discussed case with cardiology given patient has flat troponin and no EKG changes cardiology will see patient in the morning for consult.  Significant Events: Admitted 05/29/2024 for elevated troponin   Admission Labs: WBC 5.1, HgB 12.2, plt 155 Na 144, K 4.4, CO2 of 25, BUN 13, Scr 1.51, glu 88 Troponin I 49 >> 50 >>55>>49  Admission Imaging Studies: CXR No active cardiopulmonary disease.   Significant Labs:   Significant Imaging Studies:   Antibiotic Therapy: Anti-infectives (From admission, onward)    None       Procedures:   Consultants: cardiology   Hospital Course by Problem: * Elevated troponin I level 05/30/24 pt seen [facility] cardiology. Pt's chest pain deemed non-cardiac. Cards has given pt's discharge clearance. Wife updated. Stable for DC.   Sinus bradycardia 05/30/24 stable.   Parkinson disease (HCC) 05/30/24 stable.   Generalized anxiety disorder 05/30/24 on lexapro .   CKD stage 3b, GFR 30-44 ml/min (HCC) - baseline Scr 1.5-1.7 05/30/24 stable. baseline Scr 1.5-1.7    Lewy body dementia (HCC) 05/30/24 chronic.   Chronic diastolic CHF (congestive heart failure) (HCC) 05/30/24 stable. Euvolemic.   Colostomy present (HCC) 05/30/24 stable.   Essential hypertension 05/30/24 stable.    Discharge Diagnoses:  Principal Problem:   Elevated troponin I level Active Problems:   Essential hypertension   Colostomy present (HCC)   Chronic diastolic CHF (congestive heart failure) (HCC)   Lewy body dementia (HCC)   CKD stage 3b,  GFR 30-44 ml/min (HCC) - baseline Scr 1.5-1.7   Generalized anxiety disorder   Parkinson disease (HCC)   Sinus bradycardia   Discharge Instructions  Discharge  Instructions     Call MD for:  difficulty breathing, headache or visual disturbances   Complete by: As directed    Call MD for:  extreme fatigue   Complete by: As directed    Call MD for:  hives   Complete by: As directed    Call MD for:  persistant dizziness or light-headedness   Complete by: As directed    Call MD for:  persistant nausea and vomiting   Complete by: As directed    Call MD for:  redness, tenderness, or signs of infection (pain, swelling, redness, odor or green/yellow discharge around incision site)   Complete by: As directed    Call MD for:  severe uncontrolled pain   Complete by: As directed    Call MD for:  temperature >100.4   Complete by: As directed    Diet - low sodium heart healthy   Complete by: As directed    Discharge instructions   Complete by: As directed    1. Follow up with your primary care provider in 1-2 weeks following discharge from hospital. 2. Cardiology office will make a follow up appointment for you.   Increase activity slowly   Complete by: As directed       Allergies as of 05/30/2024       Reactions   Dust Mite Extract Other (See Comments)   -seasonal allergies   Morphine Nausea Only        Medication List     TAKE these medications    aspirin  EC 81 MG tablet Take 1 tablet (81 mg total) by mouth daily. Swallow whole.   escitalopram  5 MG tablet Commonly known as: LEXAPRO  Take 5 mg by mouth daily.   finasteride  5 MG tablet Commonly known as: PROSCAR  Take 5 mg by mouth daily.   lactase 3000 units tablet Commonly known as: LACTAID Take 1 tablet by mouth daily as needed.   Melatonin 5 MG Caps Take 2 capsules by mouth at bedtime.   nitroGLYCERIN 0.4 MG SL tablet Commonly known as: NITROSTAT Place 0.4 mg under the tongue.   rivastigmine  4.6 mg/24hr Commonly known as: EXELON  Place 4.6 mg onto the skin daily.   rosuvastatin  20 MG tablet Commonly known as: CRESTOR  Take 1 tablet (20 mg total) by mouth daily.         Allergies  Allergen Reactions   Dust Mite Extract Other (See Comments)    -seasonal allergies   Morphine Nausea Only    Discharge Exam: Vitals:   05/30/24 1215 05/30/24 1317  BP: (!) 156/81 133/89  Pulse: 74 (!) 59  Resp: 12   Temp:  98.1 F (36.7 C)  SpO2: 100% 100%    Physical Exam Vitals and nursing note reviewed.  Constitutional:      General: He is not in acute distress.    Appearance: He is normal weight. He is not toxic-appearing or diaphoretic.  HENT:     Head: Normocephalic and atraumatic.  Cardiovascular:     Rate and Rhythm: Normal rate and regular rhythm.     Pulses: Normal pulses.  Pulmonary:     Effort: Pulmonary effort is normal.     Breath sounds: Normal breath sounds.  Abdominal:     General: Abdomen is flat. Bowel sounds are normal.     Palpations: Abdomen is  soft.     Comments: RLQ colostomy  Musculoskeletal:     Right lower leg: No edema.     Left lower leg: No edema.  Skin:    General: Skin is warm and dry.     Capillary Refill: Capillary refill takes less than 2 seconds.  Neurological:     Mental Status: He is alert.     Comments: Pleasantly demented     The results of significant diagnostics from this hospitalization (including imaging, microbiology, ancillary and laboratory) are listed below for reference.    Labs: BNP (last 3 results) Recent Labs    03/27/24 1100  BNP 55.0   Basic Metabolic Panel: Recent Labs  Lab 05/29/24 1740 05/30/24 0100  NA 144 144  K 4.4 3.8  CL 113* 114*  CO2 25 20*  GLUCOSE 88 98  BUN 13 15  CREATININE 1.51* 1.56*  CALCIUM  9.2 8.9   Liver Function Tests: Recent Labs  Lab 05/30/24 0100  AST 16  ALT 10  ALKPHOS 48  BILITOT 0.6  PROT 5.1*  ALBUMIN 2.8*   CBC: Recent Labs  Lab 05/29/24 1740 05/30/24 0100  WBC 5.1 5.0  HGB 12.2* 11.5*  HCT 38.0* 36.6*  MCV 87.2 88.4  PLT 155 128*   Cardiac Enzymes: Recent Labs  Lab 05/29/24 1740 05/29/24 1944 05/29/24 2324  05/30/24 0100  TROPONINIHS 49* 50* 55* 49*   Sepsis Labs Recent Labs  Lab 05/29/24 1740 05/30/24 0100  WBC 5.1 5.0    Procedures/Studies: DG Chest 2 View Result Date: 05/29/2024 CLINICAL DATA:  Chest pain. EXAM: CHEST - 2 VIEW COMPARISON:  March 27, 2024 FINDINGS: The heart size and mediastinal contours are within normal limits. Both lungs are clear. Multilevel degenerative changes are seen throughout the thoracic spine. IMPRESSION: No active cardiopulmonary disease. Electronically Signed   By: Suzen Dials M.D.   On: 05/29/2024 15:21    Time coordinating discharge: 60 mins  SIGNED:  Camellia Door, DO [facility] 05/30/24, 2:09 PM

## 2024-05-30 NOTE — Assessment & Plan Note (Addendum)
 05/30/24 stable. baseline Scr 1.5-1.7

## 2024-05-30 NOTE — Subjective & Objective (Signed)
 Pt seen and examined. Discussed with Dr. Lavona with cardiology. Pt does not need to stay in the hospital for echo results. Pt's chest pain is non-cardiac in nature and he is cleared for discharged. I called pt's wife Beverley and gave her update. Pt is cleared to restart adult daycare activities. Work note written for wife.

## 2024-05-30 NOTE — Progress Notes (Addendum)
 PROGRESS NOTE    Carl Figueroa  FMW:[id number] DOB: 1946-02-05 DOA: 05/29/2024 PCP: Epifanio Alm SQUIBB, MD  Subjective: Pt seen and examined. Discussed with Dr. Lavona with cardiology. Pt does not need to stay in the hospital for echo results. Pt's chest pain is non-cardiac in nature and he is cleared for discharged. I called pt's wife Carl Figueroa and gave her update. Pt is cleared to restart adult daycare activities. Work note written for wife.   Hospital Course: CC: chest pain HPI: Carl Figueroa is a 78 y.o. 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  The chest pain get exacerbated while patient was participating aerobic dance and stop the exercise due to the pain.  EMS called.   Per chart review patient previously being admitted for NSTEMI treated with IV heparin  drip and cardiology recommended conservative management.  Patient was eventually discharged home with aspirin  and Crestor .  Due to high risk her for from underlying liver dementia not a good candidate for dual antiplatelet therapy.     During my evaluation at the bedside patient denies any chest pain, chest pressure, palpitation or shortness of breath.  Patient does not have any other complaint at this time.  Heart     ED Course:  At presentation to ED heart rate in between 49-53.  Blood pressure within good range.  Otherwise hemodynamically stable.   CBC unremarkable.  BMP unremarkable renal function at baseline. Flat troponin 49 and 50.   Chest x-ray no active disease process.   ED physician reported that EMS gave aspirin  load and after 2 doses of sublingual nitroglycerin chest pain has been improved.   Hospitalist  has been consulted for further pressure management of chest pain and elevated troponin. Discussed case with cardiology given patient has flat troponin and no EKG changes cardiology will see patient in the morning for consult.  Significant Events: Admitted 05/29/2024 for elevated troponin   Admission Labs: WBC 5.1, HgB 12.2, plt 155 Na 144, K 4.4, CO2 of 25, BUN 13, Scr 1.51, glu 88 Troponin I 49 >> 50 >>55>>49  Admission Imaging Studies: CXR No active cardiopulmonary disease.   Significant Labs:   Significant Imaging Studies:   Antibiotic Therapy: Anti-infectives (From admission, onward)    None       Procedures:   Consultants: cardiology    Assessment and Plan: * Elevated troponin I level 05/30/24 pt seen [facility] cardiology. Pt's chest pain deemed non-cardiac. Cards has given pt's discharge clearance. Wife updated. Stable for DC.   Sinus bradycardia 05/30/24 stable.   Parkinson disease (HCC) 05/30/24 stable.   Generalized anxiety disorder 05/30/24 on lexapro .   CKD stage 3b, GFR 30-44 ml/min (HCC) - baseline Scr 1.5-1.7 05/30/24 stable. baseline Scr 1.5-1.7    Lewy body dementia (HCC) 05/30/24 chronic.   Chronic diastolic CHF (congestive heart failure) (HCC) 05/30/24 stable. Euvolemic.   Colostomy present (HCC) 05/30/24 stable.   Essential hypertension 05/30/24 stable.    DVT prophylaxis: enoxaparin  (LOVENOX ) injection 40 mg Start: 05/29/24 2215 SCDs Start: 05/29/24 2207 Place TED hose Start: 05/29/24 2207    Code Status: Full Code Family Communication: discussed with pt's wife Carl Figueroa via phone Disposition Plan: home Reason for continuing need for hospitalization: stable for DC.  Objective: Vitals:   05/30/24 1145 05/30/24 1200  05/30/24 1215 05/30/24 1317  BP: (!) 151/79 (!) 143/81 (!) 156/81 133/89  Pulse: (!) 56 (!) 32 74 (!) 59  Resp: 12 (!) 21 12   Temp:    98.1 F (36.7 C)  TempSrc:    Oral  SpO2: 100% 100% 100% 100%  Weight:     67.2 kg  Height:    5' 7 (1.702 m)    Intake/Output Summary (Last 24 hours) at 05/30/2024 1403 Last data filed at 05/30/2024 0128 Gross per 24 hour  Intake --  Output 50 ml  Net -50 ml   Filed Weights   05/29/24 1417 05/29/24 1722 05/30/24 1317  Weight: 69.4 kg 69.4 kg 67.2 kg    Examination:  Physical Exam Vitals and nursing note reviewed.  Constitutional:      General: He is not in acute distress.    Appearance: He is normal weight. He is not toxic-appearing or diaphoretic.  HENT:     Head: Normocephalic and atraumatic.  Cardiovascular:     Rate and Rhythm: Normal rate and regular rhythm.     Pulses: Normal pulses.  Pulmonary:     Effort: Pulmonary effort is normal.     Breath sounds: Normal breath sounds.  Abdominal:     General: Abdomen is flat. Bowel sounds are normal.     Palpations: Abdomen is soft.     Comments: RLQ colostomy  Musculoskeletal:     Right lower leg: No edema.     Left lower leg: No edema.  Skin:    General: Skin is warm and dry.     Capillary Refill: Capillary refill takes less than 2 seconds.  Neurological:     Mental Status: He is alert.     Comments: Pleasantly demented     Data Reviewed: I have personally reviewed following labs and imaging studies  CBC: Recent Labs  Lab 05/29/24 1740 05/30/24 0100  WBC 5.1 5.0  HGB 12.2* 11.5*  HCT 38.0* 36.6*  MCV 87.2 88.4  PLT 155 128*   Basic Metabolic Panel: Recent Labs  Lab 05/29/24 1740 05/30/24 0100  NA 144 144  K 4.4 3.8  CL 113* 114*  CO2 25 20*  GLUCOSE 88 98  BUN 13 15  CREATININE 1.51* 1.56*  CALCIUM  9.2 8.9   GFR: Estimated Creatinine Clearance: 36.5 mL/min (A) (by C-G formula based on SCr of 1.56 mg/dL (H)). Liver Function Tests: Recent Labs  Lab 05/30/24 0100  AST 16  ALT 10  ALKPHOS 48  BILITOT 0.6  PROT 5.1*  ALBUMIN 2.8*   BNP (last 3 results) Recent Labs    03/27/24 1100  BNP 55.0   Radiology Studies: DG Chest 2 View Result Date:  05/29/2024 CLINICAL DATA:  Chest pain. EXAM: CHEST - 2 VIEW COMPARISON:  March 27, 2024 FINDINGS: The heart size and mediastinal contours are within normal limits. Both lungs are clear. Multilevel degenerative changes are seen throughout the thoracic spine. IMPRESSION: No active cardiopulmonary disease. Electronically Signed   By: Suzen Dials M.D.   On: 05/29/2024 15:21   Scheduled Meds:  aspirin  EC  81 mg Oral Daily   enoxaparin  (LOVENOX ) injection  40 mg Subcutaneous QHS   escitalopram   5 mg Oral Daily   finasteride   5 mg Oral Daily   melatonin  10 mg Oral QHS   rivastigmine   4.6 mg Transdermal Daily   rosuvastatin   20 mg Oral Daily   sodium chloride  flush  3 mL Intravenous Q12H   Continuous Infusions:  sodium chloride        LOS: 1 day   Time spent: 60 minutes  Camellia Door, DO  [facility]  05/30/2024, 2:03 PM

## 2024-05-30 NOTE — ED Notes (Addendum)
 Pt assisted to ambulating to restroom. Pt is A&Ox2, pt confused and speaking to people not in room. Pt returned back to bed and given graham crackers and ginger ale.

## 2024-05-30 NOTE — Assessment & Plan Note (Addendum)
 05/30/24 chronic.

## 2024-05-30 NOTE — Assessment & Plan Note (Addendum)
 05/30/24 stable. Euvolemic.

## 2024-05-30 NOTE — Assessment & Plan Note (Addendum)
 05/30/24 pt seen [facility] cardiology. Pt's chest pain deemed non-cardiac. Cards has given pt's discharge clearance. Wife updated. Stable for DC.

## 2024-05-30 NOTE — Assessment & Plan Note (Addendum)
 05/30/24 on lexapro .

## 2024-05-30 NOTE — Hospital Course (Addendum)
 CC: chest pain HPI: Carl Figueroa is a 78 y.o. 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  The chest pain get exacerbated while patient was participating aerobic dance and stop the exercise due to the pain.  EMS called.   Per chart review patient previously being admitted for NSTEMI treated with IV heparin  drip and cardiology recommended conservative management.  Patient was eventually discharged home with aspirin  and Crestor .  Due to high risk her for from underlying liver dementia not a good candidate for dual antiplatelet therapy.     During my evaluation at the bedside patient denies any chest pain, chest pressure, palpitation or shortness of breath.  Patient does not have any other complaint at this time.  Heart     ED Course:  At presentation to ED heart rate in between 49-53.  Blood pressure within good range.  Otherwise hemodynamically stable.   CBC unremarkable.  BMP unremarkable renal function at baseline. Flat troponin 49 and 50.   Chest x-ray no active disease process.   ED physician reported that EMS gave aspirin  load and after 2 doses of sublingual nitroglycerin chest pain has been improved.   Hospitalist has been consulted for further pressure management of chest pain and elevated troponin. Discussed case with cardiology given patient has flat troponin and no EKG changes cardiology will see patient in the morning for consult.  Significant Events: Admitted 05/29/2024 for elevated troponin   Admission Labs: WBC 5.1, HgB 12.2, plt 155 Na 144, K 4.4, CO2 of 25, BUN 13, Scr 1.51, glu 88 Troponin I 49 >> 50 >>55>>49  Admission Imaging Studies: CXR No active cardiopulmonary  disease.   Significant Labs:   Significant Imaging Studies:   Antibiotic Therapy: Anti-infectives (From admission, onward)    None       Procedures:   Consultants: cardiology

## 2024-06-05 ENCOUNTER — Telehealth: Payer: Self-pay

## 2024-06-05 NOTE — Telephone Encounter (Signed)
 Pt made aware of Dr. Ulysses recommendations. Appointment rescheduled for 06/17/24.  End, Lonni, MD  P Cv Div [facility] Scheduling; Desiderio Russell SAILOR, RN Good afternoon,  Carl Figueroa PCP asked that I see him in the next few weeks to see if anything can be done to minimize the risk that he is readmitted with chest pain.  It looks like he is scheduled to see Bernardino on 11/3.  Could you try to set him up to see me instead in the next 2 weeks?  Thanks.  [place]

## 2024-06-17 ENCOUNTER — Ambulatory Visit: Attending: Internal Medicine | Admitting: Internal Medicine

## 2024-06-17 ENCOUNTER — Encounter: Payer: Self-pay | Admitting: Internal Medicine

## 2024-06-17 VITALS — BP 120/78 | HR 64 | Ht 67.0 in | Wt 155.8 lb

## 2024-06-17 DIAGNOSIS — R072 Precordial pain: Secondary | ICD-10-CM

## 2024-06-17 DIAGNOSIS — I5A Non-ischemic myocardial injury (non-traumatic): Secondary | ICD-10-CM

## 2024-06-17 DIAGNOSIS — I25118 Atherosclerotic heart disease of native coronary artery with other forms of angina pectoris: Secondary | ICD-10-CM | POA: Diagnosis present

## 2024-06-17 DIAGNOSIS — E785 Hyperlipidemia, unspecified: Secondary | ICD-10-CM | POA: Diagnosis not present

## 2024-06-17 DIAGNOSIS — I214 Non-ST elevation (NSTEMI) myocardial infarction: Secondary | ICD-10-CM

## 2024-06-17 LAB — ECHOCARDIOGRAM COMPLETE
AR max vel: 2.7 cm2
AV Peak grad: 5.2 mmHg
Ao pk vel: 1.14 m/s
Area-P 1/2: 4.39 cm2
Height: 66 in
S' Lateral: 2.3 cm
Weight: 2447.99 [oz_av]

## 2024-06-17 MED ORDER — RANOLAZINE ER 500 MG PO TB12: 500.0000 mg | ORAL_TABLET | Freq: Two times a day (BID) | ORAL | 3 refills | Status: AC

## 2024-06-17 MED ORDER — ROSUVASTATIN CALCIUM 10 MG PO TABS: 10.0000 mg | ORAL_TABLET | Freq: Every day | ORAL | 3 refills | Status: DC

## 2024-06-17 NOTE — Patient Instructions (Signed)
 Medication Instructions:  Your physician recommends the following medication changes.  START TAKING: Ranolazine (Ranexa) 500 mg by mouth twice a day Rosuvastatin  10 mg by mouth daily   *If you need a refill on your cardiac medications before your next appointment, please call your pharmacy*  Lab Work: No labs ordered today    Testing/Procedures: No test ordered today   Follow-Up: At [facility], you and your health needs are our priority.  As part of our continuing mission to provide you with exceptional heart care, our providers are all part of one team.  This team includes your primary Cardiologist (physician) and Advanced Practice Providers or APPs (Physician Assistants and Nurse Practitioners) who all work together to provide you with the care you need, when you need it.  Your next appointment:   3 month(s)  Provider:   You may see Lonni Hanson, MD or one of the following Advanced Practice Providers on your designated Care Team:   Lonni Meager, NP Lesley Maffucci, PA-C Bernardino Bring, PA-C Cadence [place], PA-C Tylene Lunch, NP Barnie Hila, NP

## 2024-06-17 NOTE — Progress Notes (Unsigned)
 Cardiology Office Note:  .   Date:  06/19/2024  ID:  Shalamar, Crays [birth year], MRN [id number] PCP: Epifanio Alm SQUIBB, MD  [facility] Providers Cardiologist:  Lonni Hanson, MD     History of Present Illness: .   Bush Rhyker Silversmith is a 78 y.o. male with history of traumatic brain injury and Parkinson's disease complicated by Lewy body dementia as well as questionable NSTEMI with troponin peaking at 100 in 01/2024 (catheterization not pursued in the setting of dementia, CKD, and fall risk), hypertension, CKD stage IIIa, and chronic low back/leg pain, who presents for follow-up of chest pain.  He was last seen in the office in 02/2024 by Lesley Maffucci, PA, at which time he seemed to be doing well from a heart standpoint, though his wife mentioned intermittent lightheadedness for a few weeks.  He had not been taking his medications regularly out of concerns that they were contributing to his dizziness.  Neurology reportedly told Mr. Alvira and his wife that his dizziness was related to his history of TBI.  He was hospitalized at [facility] earlier this month with chest pain rating to the left arm.  It is supposedly began while he was participating in aerobic dance, though he denied chest pain when evaluated by the admitting hospitalist after receiving 2 doses of sublingual nitroglycerin.  Sinus bradycardia was noted in the ED.  Troponin was mildly elevated and flat (49->50).  Echo did not show any significant abnormalities.  Today, Cervone reports that he intermittently experiences left-sided chest tightness with strenuous activities such as doing exercises at his senior center.  It sometimes lingers for a few hours afterwards.  Notes intermittent palpitations as well, though not clearly associated with the aforementioned chest pain.  He denies shortness of breath, lightheadedness, and edema.  He continues to have some balance issues, having last fall on about a month ago.  He is  not on rosuvastatin  currently, as he believes this may have caused him to become dizzy in the past.  Home blood pressures are typically between 110-120/50-60 mmHg.  ROS: See HPI  Studies Reviewed: SABRA   EKG Interpretation Date/Time:  Wednesday June 17 2024 14:14:25 EDT Ventricular Rate:  64 PR Interval:  186 QRS Duration:  82 QT Interval:  388 QTC Calculation: 400 R Axis:   33  Text Interpretation: Normal sinus rhythm Cannot rule out Inferior infarct , age undetermined Cannot rule out Anterior infarct , age undetermined ST & T wave abnormality, consider lateral ischemia Abnormal ECG When compared with ECG of 29-May-2024 15:13, Lateral ST/T abnormalities are now present Confirmed by Shyanne Mcclary, Lonni [id number]) on 06/19/2024 2:43:42 PM    TTE (05/30/2024): Normal LV size with mild LVH.  LVEF 65-70% with normal wall motion and grade 2 diastolic dysfunction.  Normal RV size and function.  Normal biatrial size.  Aortic sclerosis without stenosis.  Pharmacologic MPI (03/06/2024): Probably normal, low risk pharmacologic myocardial perfusion stress test without evidence of significant ischemia.  Fixed inferior/inferoseptal defect with normal wall motion is most consistent with diaphragmatic attenuation and less likely scar.  LVEF greater than 65%.  No significant change from prior study on 09/04/2019.  Risk Assessment/Calculations:          Physical Exam:   VS:  BP 120/78 (BP Location: Left Arm, Patient Position: Sitting, Cuff Size: Normal)   Pulse 64   Ht 5' 7 (1.702 m)   Wt 155 lb 12.8 oz (70.7 kg)   SpO2 97%   BMI  24.40 kg/m    Wt Readings from Last 3 Encounters:  06/17/24 155 lb 12.8 oz (70.7 kg)  05/30/24 148 lb 2.4 oz (67.2 kg)  04/09/24 154 lb 6.4 oz (70 kg)    General: Elderly man seated in a wheelchair.  He is accompanied by his wife.. Neck: No JVD or HJR. Lungs: Clear to auscultation bilaterally without wheezes or crackles. Heart: Regular rate and rhythm with 1/6 systolic  murmur. Abdomen: Soft, nontender, nondistended. Extremities: No lower extremity edema.  ASSESSMENT AND PLAN: .    Coronary artery disease with stable angina: Mr. Kusek endorses recurrent episodes of exercise-induced chest pain concerning for angina.  He has also been hospitalized a couple of times over the last year in the setting of chest pain and mild troponin elevation.  Due to his  comorbidities, he has not undergone cardiac catheterization.  However, myocardial perfusion stress test in July was reassuring without evidence of significant ischemia or scar.  Echo during most recent hospitalization earlier this month showed normal LVEF without wall motion abnormality.  Overall, he feels like the frequency and severity of his chest pain is stable since his last visit.  His EKG today is notable for new lateral ischemic changes.  We discussed medication titration versus moving ahead with cardiac catheterization.  This to Vanatta and his wife wish to avoid catheterization if possible.  We will therefore initiate ranolazine 500 mg twice daily, as it is the antianginal medication that is least likely to cause further drops in his blood pressure and precipitate dizziness.  Continue aspirin  for secondary prevention.  We have discussed resumption of rosuvastatin  versus trial of an alternative agent given concerns for dizziness with rosuvastatin ; we will restart rosuvastatin  albeit at 10 mg daily.  Hyperlipidemia: In the setting of presumed coronary artery disease, target LDL is less than 70.  Mr. Melhorn has not tolerated rosuvastatin  20 mg daily due to dizziness.  We have discussed alternative treatment options but have agreed to retry rosuvastatin  at a lower dose (10 mg daily).  Will plan to repeat a lipid panel and ALT in about 3 months if he is able to tolerate this dose.    Dispo: Return to clinic in 3 months.  Signed, Lonni Hanson, MD

## 2024-06-19 ENCOUNTER — Encounter: Payer: Self-pay | Admitting: Internal Medicine

## 2024-06-19 DIAGNOSIS — I25118 Atherosclerotic heart disease of native coronary artery with other forms of angina pectoris: Secondary | ICD-10-CM | POA: Insufficient documentation

## 2024-06-22 ENCOUNTER — Ambulatory Visit: Admitting: Physician Assistant

## 2024-07-09 ENCOUNTER — Ambulatory Visit: Attending: Otolaryngology

## 2024-09-16 ENCOUNTER — Ambulatory Visit: Admitting: Internal Medicine

## 2024-09-23 ENCOUNTER — Ambulatory Visit: Admitting: Internal Medicine

## 2024-09-23 ENCOUNTER — Encounter: Payer: Self-pay | Admitting: Internal Medicine

## 2024-09-23 VITALS — BP 140/70 | HR 55 | Ht 67.0 in | Wt 151.6 lb

## 2024-09-23 DIAGNOSIS — I214 Non-ST elevation (NSTEMI) myocardial infarction: Secondary | ICD-10-CM

## 2024-09-23 DIAGNOSIS — E785 Hyperlipidemia, unspecified: Secondary | ICD-10-CM | POA: Diagnosis not present

## 2024-09-23 DIAGNOSIS — I25118 Atherosclerotic heart disease of native coronary artery with other forms of angina pectoris: Secondary | ICD-10-CM

## 2024-09-23 DIAGNOSIS — Z79899 Other long term (current) drug therapy: Secondary | ICD-10-CM | POA: Diagnosis not present

## 2024-09-23 DIAGNOSIS — I10 Essential (primary) hypertension: Secondary | ICD-10-CM | POA: Diagnosis not present

## 2024-09-23 NOTE — Patient Instructions (Signed)
 Medication Instructions:  Your physician recommends that you continue on your current medications as directed. Please refer to the Current Medication list given to you today.    *If you need a refill on your cardiac medications before your next appointment, please call your pharmacy*  Lab Work: Your provider would like for you to have following labs drawn today Lipid, ALT.     Testing/Procedures: No test ordered today   Follow-Up: At [facility], you and your health needs are our priority.  As part of our continuing mission to provide you with exceptional heart care, our providers are all part of one team.  This team includes your primary Cardiologist (physician) and Advanced Practice Providers or APPs (Physician Assistants and Nurse Practitioners) who all work together to provide you with the care you need, when you need it.  Your next appointment:   6 month(s)  Provider:   You may see Lonni Hanson, MD or one of the following Advanced Practice Providers on your designated Care Team:   Lonni Meager, NP Lesley Maffucci, PA-C Bernardino Bring, PA-C Cadence Saddlebrooke, PA-C Tylene Lunch, NP Barnie Hila, NP

## 2024-09-23 NOTE — Progress Notes (Unsigned)
 " Cardiology Office Note:  .   Date:  09/24/2024  ID:  Teruo, Stilley 1946/08/09, MRN [id number] PCP: Epifanio Alm SQUIBB, MD  [facility] Providers Cardiologist:  Lonni Hanson, MD     History of Present Illness: .   Carl Figueroa is a 79 y.o. male with history of traumatic brain injury and Parkinson's disease complicated by Lewy body dementia as well as questionable NSTEMI with troponin peaking at 100 in 01/2024 (catheterization not pursued in the setting of dementia, CKD, and fall risk), hypertension, CKD stage IIIa, and chronic low back/leg pain who presents for follow-up of intermittent chest pain.  I last saw Carl Figueroa in October, at which time we elected to add ranolazine  and resume rosuvastatin  (previously held out of concerns that this was contributing to his dizziness).  Today, Carl Figueroa reports that he is feeling a little bit better.  He has only had 1 episode of chest pain since we added ranolazine .  He was under stress at the time.  He has not had any exertional symptoms.  He also denies shortness of breath, edema, and palpitations.  He continues to complain of dizziness and weakness with activity, similar to prior visits.  He also has longstanding and severe tinnitus that bothers Carl Figueroa.  ROS: See HPI  Studies Reviewed: SABRA   EKG Interpretation Date/Time:  Wednesday September 23 2024 14:40:56 EST Ventricular Rate:  55 PR Interval:  166 QRS Duration:  76 QT Interval:  412 QTC Calculation: 394 R Axis:   52  Text Interpretation: Sinus bradycardia with Premature atrial complexes Nonspecific T wave abnormality Abnormal ECG When compared with ECG of 17-Jun-2024 14:14, Inferolateral ST/T abnormal is less pronounced Confirmed by Carl Figueroa ([id number]) on 09/23/2024 2:46:31 PM    TTE (05/30/2024): Normal LV size with mild LVH.  LVEF 65-70% with normal wall motion and grade 2 diastolic dysfunction.  Elevated left atrial pressure noted.  Normal RV size and function.  Normal  biatrial size.  No pericardial effusion.  Trivial mitral regurgitation.  Aortic sclerosis without stenosis.  Risk Assessment/Calculations:           Physical Exam:   VS:  BP (!) 140/70 (BP Location: Left Arm, Patient Position: Sitting, Cuff Size: Normal)   Pulse (!) 55 Comment: 51 oximeter  Ht 5' 7 (1.702 m)   Wt 151 lb 9.6 oz (68.8 kg)   SpO2 96%   BMI 23.74 kg/m    Wt Readings from Last 3 Encounters:  09/23/24 151 lb 9.6 oz (68.8 kg)  06/17/24 155 lb 12.8 oz (70.7 kg)  05/30/24 148 lb 2.4 oz (67.2 kg)    General:  NAD. Neck: No JVD or HJR. Lungs: Clear to auscultation bilaterally without wheezes or crackles. Heart: Cardia but regular with 1/6 systolic murmur.  No rubs or gallops. Abdomen: Soft, nontender, nondistended. Extremities: No lower extremity edema.  ASSESSMENT AND PLAN: .    Coronary artery disease with stable angina: Carl Figueroa has a long history of intermittent chest pain which fortunately has improved with ranolazine  added at our last visit.  His MPI in 02/2024 was low risk.  Due to his age and comorbidities, we have elected to defer catheterization in the past and again today.  We will continue aspirin  and rosuvastatin  10 mg daily for secondary prevention.  Hyperlipidemia: Carl Figueroa continues to have dizziness though I am not convinced this is related to rosuvastatin  as it has not worsened since we restarted this.  We will continue rosuvastatin  10  mg daily and repeat a lipid panel/ALT today to ensure appropriate response.  HTN: Blood pressure mildly elevated today.  However, in the setting of frequent dizziness, we will tolerate a degree of permissive hypertension.  Defer pharmacotherapy at this time; sodium restriction encouraged.    Dispo: RTC in 6 months.  Signed, Lonni Hanson, MD   "

## 2024-09-24 ENCOUNTER — Ambulatory Visit: Payer: Self-pay | Admitting: Internal Medicine

## 2024-09-24 ENCOUNTER — Encounter: Payer: Self-pay | Admitting: Internal Medicine

## 2024-09-24 DIAGNOSIS — Z79899 Other long term (current) drug therapy: Secondary | ICD-10-CM

## 2024-09-24 LAB — LIPID PANEL
Chol/HDL Ratio: 2.8 ratio (ref 0.0–5.0)
Cholesterol, Total: 185 mg/dL (ref 100–199)
HDL: 65 mg/dL
LDL Chol Calc (NIH): 102 mg/dL — ABNORMAL HIGH (ref 0–99)
Triglycerides: 103 mg/dL (ref 0–149)
VLDL Cholesterol Cal: 18 mg/dL (ref 5–40)

## 2024-09-24 LAB — ALT: ALT: 9 [IU]/L (ref 0–44)

## 2024-09-24 MED ORDER — ROSUVASTATIN CALCIUM 20 MG PO TABS: 20.0000 mg | ORAL_TABLET | Freq: Every day | ORAL | 3 refills | Status: AC

## 2024-10-09 ENCOUNTER — Other Ambulatory Visit

## 2025-04-09 ENCOUNTER — Ambulatory Visit: Admitting: Oncology

## 2025-04-09 ENCOUNTER — Other Ambulatory Visit
